# Patient Record
Sex: Female | Born: 1975 | Race: White | Hispanic: No | Marital: Single
Health system: Midwestern US, Community
[De-identification: ages and names within clinical notes are randomized; demographics above are authoritative.]

## PROBLEM LIST (undated history)

## (undated) DIAGNOSIS — D649 Anemia, unspecified: Secondary | ICD-10-CM

## (undated) DIAGNOSIS — C801 Malignant (primary) neoplasm, unspecified: Secondary | ICD-10-CM

## (undated) DIAGNOSIS — F1010 Alcohol abuse, uncomplicated: Secondary | ICD-10-CM

## (undated) DIAGNOSIS — F17200 Nicotine dependence, unspecified, uncomplicated: Secondary | ICD-10-CM

## (undated) HISTORY — PX: ROUX-EN-Y GASTRIC BYPASS: SHX1104

## (undated) HISTORY — PX: TEAR DUCT PROBING: SHX793

## (undated) HISTORY — DX: Anemia, unspecified: D64.9

## (undated) HISTORY — PX: OTHER SURGICAL HISTORY: SHX169

## (undated) HISTORY — PX: VAGOTOMY: SUR1431

## (undated) HISTORY — PX: ABDOMINAL ADHESION SURGERY: SHX90

## (undated) HISTORY — PX: VENTRICULOPERITONEAL SHUNT: SHX204

---

## 2011-05-11 DIAGNOSIS — M25569 Pain in unspecified knee: Secondary | ICD-10-CM | POA: Insufficient documentation

## 2011-05-26 DIAGNOSIS — F1910 Other psychoactive substance abuse, uncomplicated: Secondary | ICD-10-CM | POA: Insufficient documentation

## 2012-01-24 DIAGNOSIS — I10 Essential (primary) hypertension: Secondary | ICD-10-CM | POA: Insufficient documentation

## 2012-01-24 DIAGNOSIS — Z982 Presence of cerebrospinal fluid drainage device: Secondary | ICD-10-CM | POA: Insufficient documentation

## 2012-01-24 DIAGNOSIS — F17200 Nicotine dependence, unspecified, uncomplicated: Secondary | ICD-10-CM | POA: Insufficient documentation

## 2012-01-24 DIAGNOSIS — J4520 Mild intermittent asthma, uncomplicated: Secondary | ICD-10-CM | POA: Insufficient documentation

## 2012-06-04 DIAGNOSIS — Z9889 Other specified postprocedural states: Secondary | ICD-10-CM | POA: Insufficient documentation

## 2013-03-25 LAB — URINALYSIS W/ REFLEX CULTURE
Bilirubin: NEGATIVE
Blood: NEGATIVE
Glucose: NEGATIVE mg/dL
Leukocyte Esterase: NEGATIVE
Nitrites: NEGATIVE
Protein: NEGATIVE mg/dL
Specific gravity: 1.023 (ref 1.003–1.030)
Urobilinogen: 0.2 EU/dL (ref 0.2–1.0)
pH (UA): 5.5 (ref 5.0–8.0)

## 2013-03-25 LAB — PROTHROMBIN TIME + INR
INR: 1 (ref 0.9–1.1)
Prothrombin time: 10.3 s (ref 9.0–11.1)

## 2013-03-25 LAB — METABOLIC PANEL, COMPREHENSIVE
A-G Ratio: 1.2 (ref 1.1–2.2)
ALT (SGPT): 699 U/L — ABNORMAL HIGH (ref 12–78)
AST (SGOT): 974 U/L — ABNORMAL HIGH (ref 15–37)
Albumin: 4.1 g/dL (ref 3.5–5.0)
Alk. phosphatase: 122 U/L — ABNORMAL HIGH (ref 45–117)
Anion gap: 9 mmol/L (ref 5–15)
BUN/Creatinine ratio: 16 (ref 12–20)
BUN: 10 MG/DL (ref 6–20)
Bilirubin, total: 0.4 MG/DL (ref 0.2–1.0)
CO2: 29 mmol/L (ref 21–32)
Calcium: 8.6 MG/DL (ref 8.5–10.1)
Chloride: 108 mmol/L (ref 97–108)
Creatinine: 0.63 MG/DL (ref 0.45–1.15)
GFR est AA: >60 mL/min/{1.73_m2} (ref >60)
GFR est non-AA: >60 mL/min/{1.73_m2} (ref >60)
Globulin: 3.4 g/dL (ref 2.0–4.0)
Glucose: 112 mg/dL — ABNORMAL HIGH (ref 65–100)
Potassium: 4.1 mmol/L (ref 3.5–5.1)
Protein, total: 7.5 g/dL (ref 6.4–8.2)
Sodium: 146 mmol/L — ABNORMAL HIGH (ref 136–145)

## 2013-03-25 LAB — CBC WITH AUTOMATED DIFF
ABS. BASOPHILS: 0 10*3/uL (ref 0.0–0.1)
ABS. EOSINOPHILS: 0.2 10*3/uL (ref 0.0–0.4)
ABS. LYMPHOCYTES: 2.2 10*3/uL (ref 0.8–3.5)
ABS. MONOCYTES: 0.7 10*3/uL (ref 0.0–1.0)
ABS. NEUTROPHILS: 3.2 10*3/uL (ref 1.8–8.0)
BASOPHILS: 1 % (ref 0–1)
EOSINOPHILS: 3 % (ref 0–7)
HCT: 44 % (ref 35.0–47.0)
HGB: 14.9 g/dL (ref 11.5–16.0)
LYMPHOCYTES: 34 % (ref 12–49)
MCH: 30.1 PG (ref 26.0–34.0)
MCHC: 33.9 g/dL (ref 30.0–36.5)
MCV: 88.9 FL (ref 80.0–99.0)
MONOCYTES: 12 % (ref 5–13)
NEUTROPHILS: 50 % (ref 32–75)
PLATELET: 238 10*3/uL (ref 150–400)
RBC: 4.95 M/uL (ref 3.80–5.20)
RDW: 15.6 % — ABNORMAL HIGH (ref 11.5–14.5)
WBC: 6.3 10*3/uL (ref 3.6–11.0)

## 2013-03-25 LAB — DRUG SCREEN, URINE
AMPHETAMINES: NEGATIVE
BARBITURATES: NEGATIVE
BENZODIAZEPINES: NEGATIVE
COCAINE: NEGATIVE
METHADONE: NEGATIVE
OPIATES: POSITIVE — AB
PCP(PHENCYCLIDINE): NEGATIVE
THC (TH-CANNABINOL): NEGATIVE

## 2013-03-25 LAB — ACETAMINOPHEN: Acetaminophen level: <2 ug/mL — ABNORMAL LOW (ref 10–30)

## 2013-03-25 LAB — PTT: aPTT: 22.2 s (ref 22.1–30.0)

## 2013-03-25 LAB — HCG URINE, QL. - POC: Pregnancy test,urine (POC): NEGATIVE

## 2013-03-25 LAB — SALICYLATE: Salicylate level: 2.3 MG/DL — ABNORMAL LOW (ref 2.8–20.0)

## 2013-03-25 LAB — IRON PROFILE
Iron % saturation: 88 % — ABNORMAL HIGH (ref 20–50)
Iron: 277 ug/dL — ABNORMAL HIGH (ref 35–150)
TIBC: 313 ug/dL (ref 250–450)

## 2013-03-25 LAB — FERRITIN: Ferritin: 71 NG/ML (ref 8–252)

## 2013-03-25 LAB — AMMONIA: Ammonia, plasma: 53 umol/L — ABNORMAL HIGH (ref <32)

## 2013-03-25 LAB — LIPASE: Lipase: 166 U/L (ref 73–393)

## 2013-03-25 LAB — LACTIC ACID: Lactic acid: 1.1 MMOL/L (ref 0.4–2.0)

## 2013-03-25 MED ORDER — SODIUM CHLORIDE 0.9% BOLUS IV
0.9 % | Freq: Once | INTRAVENOUS | Status: AC
Start: 2013-03-25 — End: 2013-03-25
  Administered 2013-03-25: 06:00:00 via INTRAVENOUS

## 2013-03-25 MED ORDER — IOPAMIDOL 76 % IV SOLN
370 mg iodine /mL (76 %) | Freq: Once | INTRAVENOUS | Status: AC
Start: 2013-03-25 — End: 2013-03-25
  Administered 2013-03-25: 06:00:00 via INTRAVENOUS

## 2013-03-25 MED ORDER — SODIUM CHLORIDE 0.9 % IJ SYRG
Freq: Once | INTRAMUSCULAR | Status: AC
Start: 2013-03-25 — End: 2013-03-25
  Administered 2013-03-25: 06:00:00 via INTRAVENOUS

## 2013-03-25 MED ADMIN — ondansetron (ZOFRAN) injection 4 mg: INTRAVENOUS | @ 20:00:00 | NDC 00641607801

## 2013-03-25 MED ADMIN — sodium chloride (NS) flush 5-10 mL: INTRAVENOUS | @ 18:00:00 | NDC 87701099893

## 2013-03-25 MED ADMIN — 0.9% sodium chloride infusion: INTRAVENOUS | @ 10:00:00 | NDC 00409798309

## 2013-03-25 MED ADMIN — lithium carbonate capsule 300 mg: ORAL | @ 16:00:00 | NDC 00054852725

## 2013-03-25 MED ADMIN — dicyclomine (BENTYL) 10 mg/mL injection 20 mg: INTRAMUSCULAR | @ 08:00:00 | NDC 58914008052

## 2013-03-25 MED ADMIN — HYDROmorphone (PF) (DILAUDID) injection 1 mg: INTRAVENOUS | @ 07:00:00 | NDC 00409255201

## 2013-03-25 MED ADMIN — HYDROmorphone (PF) (DILAUDID) injection 1 mg: INTRAVENOUS | @ 06:00:00 | NDC 00409255201

## 2013-03-25 MED ADMIN — lithium carbonate capsule 300 mg: ORAL | @ 21:00:00 | NDC 00054852725

## 2013-03-25 MED ADMIN — 0.9% sodium chloride infusion: INTRAVENOUS | @ 18:00:00 | NDC 00409798309

## 2013-03-25 MED ADMIN — HYDROmorphone (PF) (DILAUDID) injection 2 mg: INTRAVENOUS | @ 18:00:00 | NDC 00409335601

## 2013-03-25 MED ADMIN — busPIRone (BUSPAR) tablet 10 mg: ORAL | @ 16:00:00 | NDC 51079098601

## 2013-03-25 MED ADMIN — sodium chloride 0.9 % bolus infusion 1,000 mL: INTRAVENOUS | @ 06:00:00 | NDC 00409798309

## 2013-03-25 MED ADMIN — HYDROmorphone (PF) (DILAUDID) injection 2 mg: INTRAVENOUS | @ 14:00:00 | NDC 00409335601

## 2013-03-25 MED ADMIN — HYDROmorphone (PF) (DILAUDID) injection 0.5 mg: INTRAVENOUS | @ 10:00:00 | NDC 00409255201

## 2013-03-25 MED ADMIN — ondansetron (ZOFRAN) injection 4 mg: INTRAVENOUS | @ 09:00:00 | NDC 55150012502

## 2013-03-25 MED ADMIN — busPIRone (BUSPAR) tablet 10 mg: ORAL | @ 21:00:00 | NDC 51079098601

## 2013-03-25 MED ADMIN — ondansetron (ZOFRAN) injection 8 mg: INTRAVENOUS | @ 06:00:00 | NDC 55150012502

## 2013-03-25 MED ADMIN — HYDROmorphone (PF) (DILAUDID) injection 1 mg: INTRAVENOUS | @ 09:00:00 | NDC 00409255201

## 2013-03-25 MED ADMIN — ketorolac (TORADOL) injection 30 mg: INTRAVENOUS | @ 06:00:00 | NDC 00409379501

## 2013-03-25 MED ADMIN — HYDROmorphone (PF) (DILAUDID) injection 2 mg: INTRAVENOUS | @ 22:00:00 | NDC 00409335601

## 2013-03-25 MED ADMIN — sodium chloride (NS) flush 5-10 mL: INTRAVENOUS | @ 10:00:00 | NDC 87701099893

## 2013-03-25 MED ADMIN — 0.9% sodium chloride infusion: INTRAVENOUS | @ 07:00:00 | NDC 87701099893

## 2013-03-25 MED ADMIN — pantoprazole (PROTONIX) 40 mg in sodium chloride 0.9 % 10 mL injection: INTRAVENOUS | @ 08:00:00 | NDC 63323018610

## 2013-03-25 MED ADMIN — LORazepam (ATIVAN) injection 1 mg: INTRAVENOUS | @ 07:00:00 | NDC 00641604401

## 2013-03-25 MED ADMIN — HYDROmorphone (PF) (DILAUDID) injection 1 mg: INTRAVENOUS | @ 13:00:00 | NDC 00409255201

## 2013-03-25 MED FILL — KETOROLAC TROMETHAMINE 30 MG/ML INJECTION: 30 mg/mL (1 mL) | INTRAMUSCULAR | Qty: 1

## 2013-03-25 MED FILL — LORAZEPAM 2 MG/ML IJ SOLN: 2 mg/mL | INTRAMUSCULAR | Qty: 1

## 2013-03-25 MED FILL — BD POSIFLUSH NORMAL SALINE 0.9 % INJECTION SYRINGE: INTRAMUSCULAR | Qty: 10

## 2013-03-25 MED FILL — ISOVUE-370  76 % INTRAVENOUS SOLUTION: 370 mg iodine /mL (76 %) | INTRAVENOUS | Qty: 100

## 2013-03-25 MED FILL — SODIUM CHLORIDE 0.9 % IV: INTRAVENOUS | Qty: 1000

## 2013-03-25 MED FILL — ONDANSETRON (PF) 4 MG/2 ML INJECTION: 4 mg/2 mL | INTRAMUSCULAR | Qty: 4

## 2013-03-25 MED FILL — DILAUDID (PF) 2 MG/ML INJECTION SOLUTION: 2 mg/mL | INTRAMUSCULAR | Qty: 1

## 2013-03-25 MED FILL — HYDROMORPHONE (PF) 1 MG/ML IJ SOLN: 1 mg/mL | INTRAMUSCULAR | Qty: 1

## 2013-03-25 MED FILL — BENTYL 10 MG/ML INTRAMUSCULAR SOLUTION: 10 mg/mL | INTRAMUSCULAR | Qty: 2

## 2013-03-25 MED FILL — BUSPIRONE 10 MG TAB: 10 mg | ORAL | Qty: 1

## 2013-03-25 MED FILL — LITHIUM CARBONATE 300 MG CAP: 300 mg | ORAL | Qty: 1

## 2013-03-25 MED FILL — PROTONIX 40 MG INTRAVENOUS SOLUTION: 40 mg | INTRAVENOUS | Qty: 40

## 2013-03-25 MED FILL — ONDANSETRON (PF) 4 MG/2 ML INJECTION: 4 mg/2 mL | INTRAMUSCULAR | Qty: 2

## 2013-03-25 NOTE — Progress Notes (Signed)
TRANSFER - IN REPORT:    Verbal report received from [place], Charity fundraiserN (name) on Sahar Kuechle  being received from ED (unit) for routine progression of care      Report consisted of patient???s Situation, Background, Assessment and   Recommendations(SBAR).     Information from the following report(s) SBAR, Kardex, ED Summary, Intake/Output, MAR and Recent Results was reviewed with the receiving nurse.    Opportunity for questions and clarification was provided.      Assessment completed upon patient???s arrival to unit and care assumed.

## 2013-03-25 NOTE — Other (Signed)
TRANSFER - OUT REPORT:    Verbal report given to Shay,RN(name) on Daviana Bueno  being transferred to 559(unit) for routine progression of care       Report consisted of patient???s Situation, Background, Assessment and   Recommendations(SBAR).     Information from the following report(s) SBAR, ED Summary, Intake/Output, MAR and Recent Results was reviewed with the receiving nurse.    Opportunity for questions and clarification was provided.

## 2013-03-25 NOTE — Progress Notes (Signed)
Spoke with Dr. Vaughan BastaPegram r/t pt c/o abdominal pain 10/10.  Rec'd one time order for Dilaudid 0.5mg  IV for breakthrough.  He stated that pt cannot have an addt'l dose until 4hrs from previous PRN dose (last @ 0448).

## 2013-03-25 NOTE — ED Notes (Signed)
Pt presents with complaints of abdominal pain that began this afternoon. Pt reports that she began having nausea, vomiting, and diarrhea on Saturday, and abdominal pain began today.

## 2013-03-25 NOTE — Consults (Addendum)
[facility]   [place] [id number]        GASTROENTEROLOGY CONSULTATION NOTE  Lynett Grimes ACNP  [phone]      NAME:  Christy Holmes   DOB:   06/25/1975   MRN:   [id number]       Referring Physician: Dr Lester Carolina    Consult Date: 03/25/2013 12:09 PM    Chief Complaint: elevated LFTs, abdominal pain, n/v and diarrhea     History of Present Illness:  Patient is a 38 y.o. female who is seen in consultation at the request of Dr. Lester Carolina for the above complaints.  Symptoms first began on Sunday with diarrhea.  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  She took 2 imodium and it stopped.  The diarrhea was not bloody or black to her recollection.  The pain and nausea followed.  The pain is located to her large midline scar and is described as a ripping, burning pain.   ER evaluation showed high transaminases and CBD of 1.5 cm.  As stated previously, she is a GBP and her gallbladder was removed 2 months after her gbp but she doesn't remember if she had stones or not.  She denies any new medications.  She takes approx 1525m of tylenol per day (tylenol for headaches and 2 tylenol pm at sleep).  Recent blood work looked good. No previous history of any liver issues. She does not drink and does not or has not ever used any IV drugs.  She denies any recent exposure to illness. Denies eating anything suspicious. No recent tick or insect bites.   PMH:  Past Medical History   Diagnosis Date   ??? Ill-defined condition      intracranial hypertension       PSH:  Past Surgical History   Procedure Laterality Date   ??? Pr abdomen surgery proc unlisted       bowel obstruction   ??? Pr abdomen surgery proc unlisted       bowel resection   ??? Pr abdomen surgery proc unlisted       gastric bypass   ??? Hx other surgical       vp shunt placement   ??? Hx appendectomy         Allergies:  Allergies   Allergen Reactions   ??? Diamox Sequels [Acetazolamide] Unknown (comments)    ??? Imitrex [Sumatriptan] Unknown (comments)   ??? Morphine Unknown (comments)       Home Medications:  Prior to Admission Medications   Prescriptions Last Dose Informant Patient Reported? Taking?   Omega-3 Fatty Acids 300 mg cap 03/23/2013  Yes Yes   Sig: Take  by mouth.   busPIRone (BUSPAR) 10 mg tablet 03/23/2013  Yes Yes   Sig: Take 10 mg by mouth three (3) times daily.   calcium 500 mg tab 03/23/2013  Yes Yes   Sig: Take  by mouth.   coenzyme q10 (CO Q-10) 10 mg cap 03/23/2013  Yes Yes   Sig: Take  by mouth.   cyanocobalamin (VITAMIN B12) 1,000 mcg/mL injection 02/22/2013  Yes Yes   Sig: 1,000 mcg by IntraMUSCular route every thirty (30) days.   lithium carbonate 300 mg capsule 03/23/2013  Yes Yes   Sig: Take  by mouth three (3) times daily (with meals).   magnesium 250 mg tab 03/23/2013  Yes Yes   Sig: Take  by  mouth.      Facility-Administered Medications: None       Hospital Medications:  Current Facility-Administered Medications   Medication Dose Route Frequency   ??? iopamidol (ISOVUE-370) 76 % injection       ??? 0.9% sodium chloride infusion  125 mL/hr IntraVENous CONTINUOUS   ??? sodium chloride (NS) flush 5-10 mL  5-10 mL IntraVENous Q8H   ??? sodium chloride (NS) flush 5-10 mL  5-10 mL IntraVENous PRN   ??? ondansetron (ZOFRAN) injection 4 mg  4 mg IntraVENous Q6H PRN   ??? busPIRone (BUSPAR) tablet 10 mg  10 mg Oral TID   ??? lithium carbonate capsule 300 mg  300 mg Oral TID WITH MEALS   ??? HYDROmorphone (PF) (DILAUDID) injection 2 mg  2 mg IntraVENous Q4H PRN       Social History:  History   Substance Use Topics   ??? Smoking status: Not on file   ??? Smokeless tobacco: Not on file   ??? Alcohol Use: Not on file       Family History:  History reviewed. No pertinent family history.    Review of Systems:  Constitutional: negative fever, negative chills, negative weight loss  Eyes:   negative visual changes  ENT:   negative sore throat, tongue or lip swelling  Respiratory:  negative cough, negative dyspnea  Cards:  negative for chest pain,  palpitations, lower extremity edema  GI:   See HPI  GU:  negative for frequency, dysuria  Integument:  negative for rash and pruritus  Heme:  negative for easy bruising and gum/nose bleeding  Musculoskel: negative for myalgias,  back pain and muscle weakness  Neuro: + headaches, no dizziness, vertigo  Psych:  negative for feelings of anxiety, depression     Objective:   Patient Vitals for the past 8 hrs:   BP Temp Pulse Resp SpO2   03/25/13 0830 128/74 mmHg 98.1 ??F (36.7 ??C) 78 16 98 %   03/25/13 0527 107/72 mmHg 98.2 ??F (36.8 ??C) 77 16 96 %   03/25/13 0454 125/82 mmHg 97.8 ??F (36.6 ??C) 84 18 94 %             EXAM: CONST- 38 year old caucasian female lying in bed grabbing her stomach    NEURO-alert and oriented x 3   HEENT-PERLA, EOMI, no scleral icterus   NECK - supple, thyroid smooth non-tender, no lymphadenopathy   LUNGS-clear to ausculation, (-) wheeze   CARD-regular rate and rhythym, S1 S2   ABD-soft, tender, large midline scar, BS +   EXT-no edema, warm   PSYCH - full, not anxious     Data Review     Recent Labs      03/25/13   0041   WBC  6.3   HGB  14.9   HCT  44.0   PLT  238     Recent Labs      03/25/13   0041   NA  146*   K  4.1   CL  108   CO2  29   BUN  10   CREA  0.63   GLU  112*   CA  8.6     Recent Labs      03/25/13   0041   SGOT  974*   AP  122*   TP  7.5   ALB  4.1   GLOB  3.4   LPSE  166     Recent Labs  03/25/13   0041   INR  1.0   PTP  10.3   APTT  22.2     ABD ultrasound  IMPRESSION:  1. Large CBD, significance uncertain postcholecystectomy.  2. No sonographically apparent choledocholithiasis.    CT ABD Pelvis  IMPRESSION:   Unremarkable CT through the abdomen and pelvis       Assessment:   ?? Elevated LFTs - viral panel pending. Tylenol level on admission was ok but takes 1500 mg of tyelnol per day and has for many years. Also on Lithium  ?? CBD 1.5 cm - s/p cholecystectomy, bilirubin and alk phos ok  ?? Abdominal pain with n/v/diarrhea - surgery has evaluated and no surgical intervention  planned.  May be related to above.     Patient Active Problem List   Diagnosis Code   ??? Intractable abdominal pain 789.00     Plan:   ?? MRCP  ?? No tylenol  ?? Consider holding lithium  ?? Follow trends  ?? Will send further liver serologies and stool studies     Signed By: Jessee Avers, NP     03/25/2013  12:09 PM

## 2013-03-25 NOTE — Other (Signed)
Clinical reviewed for medical necessity. CM to f/u with discharge planning. Jeanette Woodfin RN CRM    Sent to EHR for second review.

## 2013-03-25 NOTE — ED Notes (Signed)
Dr. Marks at the bedside.

## 2013-03-25 NOTE — H&P (Signed)
Name:       Christy Holmes, Christy Holmes                Admitted:    03/25/2013    Account #:  [id number]                     DOB:         1975/05/09  Physician:  Lucinda Dell, MD              Age:         38                               HISTORY AND PHYSICAL      CHIEF COMPLAINT: Abdominal pain, nausea, vomiting, diarrhea.    HISTORY OF PRESENT ILLNESS: A 38 year old white female, past medical  history of hydrocephalus, status post VP shunt versus (status post  removal), bipolar disorder, presented to the emergency department, chief  complaint of abdominal pain, nausea, vomiting, diarrhea. The patient notes  onset of symptoms starting approximately 2 days ago with generalized  abdominal pain, severe, constant, rated as a 9/10 without specific  alleviating factors, exacerbated with palpation, nonradiating. She  complains of nausea, vomiting, nonbloody, nonbilious emesis, episodes too  numerous to count, and diarrhea, loose, watery, nonbloody stools. She has  significant history of prior bowel obstruction requiring bowel resection in  05/2012. She notes that she underwent repeat abdominal surgery with removal  of scar tissue in September, 2014. She has a prior history of gastric  bypass. She notably receives lab work every 3 months followup for the same,  especially to monitor for anemia. She notes her last lab work was performed  in January, 2015 and revealed normal results. She notes her last normal  menstrual period was on 03/07/2013 and regular. She does not complain of  any current fever, chills, dizziness, lightheadedness, focal weakness,  headache, neck pain, back pain, chest pain, shortness of breath, cough,  congestion, numbness, paresthesias, focal weakness, melena, hematemesis,  dysuria, hematuria, calf pain, swelling, or edema. She does not report any  abdominal trauma. She indicates that her surgeries were performed at [facility] in [place] [facility] [place] On arrival in the emergency department, initial recorded  vital signs blood pressure 125/79, heart rate 92, respiratory rate 18, O2  saturation 97% on room air. Her labs showed elevated LFTs, elevated  alkaline phosphatase level, and elevated sodium. Acute abdominal series/her  chest x-ray showed moderate colonic stool, otherwise unremarkable study. CT  abdomen and pelvis with IV contrast was negative for acute  intraabdominal/pelvic process. Per the ED, the patient was already given  Dilaudid 1 mg IV x2 doses, Toradol 30 mg IV, Zofran 8 mg IV, Protonix 40 mg  IV, Ativan 1 mg IV, 0.9% normal saline 1 liter IV fluid bolus followed by  125 mL hour IV infusion, and Bentyl 20 mg IV. Despite all of these multiple  agents, the patient still notes her pain is a 9/10, having to sit upright  in bed curled over secondary to abdominal pain symptoms. She notes no prior  history of having liver dysfunction. She is here with her uncle. The  patient notes that she takes lithium. Her uncle indicates that she is also  taking BuSpar. She also lastly notes that she has been taking Tylenol PM 2  tablets at bedtime.  PAST MEDICAL HISTORY  1. Bipolar disorder - lithium and BuSpar.  2. Hydrocephalus with intracranial hypertension - status post prior VP  shunt, now status post removal of the same. The patient notes improvement  of symptoms following weight loss after gastric bypass.    PAST SURGICAL HISTORY  1. Exploratory laparotomy - with bowel resection, 05/2012.  2. Bowel resection and removal of scar tissue in September, 2014.  3. Gastric bypass, 2011.  4. VP shunt, followed by removal.  5. Appendectomy.    MEDICATIONS  1. BuSpar 10 mg p.o. t.i.d.  2. Lithium 300 mg p.o. t.i.d.  3. Tylenol PM 2 tablets p.o. at bedtime.  4. Magnesium.  5. Calcium.  6. Omega-3 fatty acids.  7. Imodium.  8. Multivitamin.    ALLERGIES  1. DIAMOX.  2. IMITREX.  3. MORPHINE.    SOCIAL HISTORY: Smokes cigarettes, half pack per day. Denies  alcohol.  Denies illicit drugs.    FAMILY HISTORY: Hypertension - her mother, brother, grandmother and  grandfather. Diabetes mellitus - mother, maternal grandmother. No reported  family members with heart attacks or strokes.    REVIEW OF SYSTEMS  Eleven systems reviewed, pertinent positives as HPI, otherwise negative.    PHYSICAL EXAMINATION  VITAL SIGNS: Temperature of 98.0 degrees Fahrenheit, blood pressure of  125/91, heart rate 83, respiratory rate 14, O2 saturation 96% on room air,  weight is 120 pounds.  GENERAL: The patient in no acute respiratory distress.  PSYCHIATRIC: The patient is awake, alert x3; anxious.  NEUROLOGIC: GCS of 15. Moves extremities x4. Sensation is grossly intact.  No slurred speech, no face asymmetry.  HEENT: Normocephalic, atraumatic. PERRLA. EOMs intact. Sclerae are  anicteric. Conjunctivae clear. Nares are patent. Oropharynx is clear.  NECK: Supple, without lymphadenopathy, JVD, carotid bruits, or  thyromegaly.  LYMPH: Negative for cervical or supraclavicular adenopathy.  RESPIRATORY: Lungs are clear to auscultation bilaterally.  CARDIOVASCULAR: Heart regular rate and rhythm, normal S1, S2, without  murmurs, rubs or gallops. GI: Abdomen soft. Generalized tenderness to light  palpation with voluntary guarding, no rebound, no rigidity. No auscultated  abdominal bruits. No pulsatile mass.  BACK: Positive for bilateral CVA tenderness. No step-off deformity.  MUSCULOSKELETAL: Full range of motion. Negative for calf tenderness.  VASCULAR: 2+ radial, 1+ dorsalis pedis pulses, without cyanosis, clubbing,  edema.  SKIN: Warm and dry.    LABORATORY DATA: Sodium 146, potassium 4.1, chloride 108, CO2 of 29, BUN of  10, creatinine 0.63, glucose 112. Anion gap 9, calcium 8.6, GFR greater  than 60, total bilirubin 0.4, total protein 7.5, albumin is 4.1, ALT of  699, AST of 974, alkaline phosphatase 122. Lipase 166. WBC 6.3, hemoglobin  14.9, hematocrit 44.0, platelets 238, neutrophils of 50%.  Urinalysis:  Leukocyte esterase negative, nitrites negative, urobilinogen 0.2, bilirubin  negative, blood negative, ketones trace, glucose negative, protein  negative, pH 5.5, specific gravity 1.023, WBC 0-4, RBCs 0-5, bacteria 1+.  CT abdomen and pelvis with IV contrast: Unremarkable CT through the abdomen  and pelvis.  Acute abdominal series with chest x-ray: Moderate colonic stool, otherwise  unremarkable.    IMPRESSION AND PLAN: A 38 year old female with noted past medical history,  now admitted with intractable abdominal pain, nausea, vomiting, elevated  LFTs.    1. Intractable abdominal pain - generalized. CT abdomen and pelvis was  unremarkable for any acute findings consistent with the severity of  patient-described patient's pain. I will consult with Gastroenterology  service today. May consult with surgical service, as she  has been  complaining of a noted scar tissue from prior surgeries. Keep patient  n.p.o. Provide IV fluids, pain medications, pain management.  2. Intractable nausea and vomiting. Provide Zofran 4 mg IV q.6h. p.r.n. for  the same.  3. Elevated LFTs. Order a right upper quadrant abdominal ultrasound. Order  acute hepatitis panel. Consult with gastroenterologist.  4. Diarrhea. Order stool for C difficile, fecal occult blood test.  5. Hypernatremia. Repeat the sodium levels. Monitor closely.  6. Bipolar disorder - on lithium and BuSpar. Patient is currently n.p.o.  Check her lithium level as patient is n.p.o. If patient is n.p.o. for any  prolonged status, then consult with Psychiatry service for further  recommendations.  7. Venous thromboembolism prophylaxis. SCDs, bilateral lower extremities.        Reviewed on 03/25/2013 7:20 AM                Lucinda Dell, MD    cc:                       Lucinda Dell, MD      MLP/wmx; D: 03/25/2013 04:51 A; T: 03/25/2013 06:25 A; DOC# [id number]; Job#  [id number]

## 2013-03-25 NOTE — Progress Notes (Signed)
Problem: Pain  Goal: *Control of Pain  Outcome: Not Progressing Towards Goal  Variance: Patient Condition  Comments: Patient's pain is not very well controlled by IV Dilaudid. She consistently rates her pain from a 8-10.

## 2013-03-25 NOTE — Progress Notes (Signed)
Bedside and Verbal shift change report given to Nestor RampNitin Sharma, RN  (oncoming nurse) by Charlott HollerSarah Nash, RN (offgoing nurse). Report included the following information SBAR.

## 2013-03-25 NOTE — Consults (Signed)
General Surgery In-Patient Consultation    Admit Date: 03/25/2013  Reason for Consultation: abd pain    HPI:  Christy Holmes is a 38 y.o. female whom we are asked to see in consultation by Dr. Vaughan BastaPegram for the above complaint.  Pt was admitted yesterday w/ abd pain.  She describes it as starting yesterday and is stabbing and constant throughout her abd.Marland Kitchen.Marland Kitchen.She has had prior nausea and vomiting but nothing that feels like it does now. . It feels like a prior SBO she had.  He has hx SBO w/ LOA last year.  She also had a gastric bypass in 2011.  All surgeries in [place].  She had a CT scan which was nl. An us which shows a dilated CBD (s/p cholecystectomy)  LFTs & NH4 level are elevated - GI is consulted    All labs reviewed  Patient Active Problem List    Diagnosis Date Noted   ??? Intractable abdominal pain 03/25/2013     Past Medical History   Diagnosis Date   ??? Ill-defined condition      intracranial hypertension      Past Surgical History   Procedure Laterality Date   ??? Pr abdomen surgery proc unlisted       bowel obstruction   ??? Pr abdomen surgery proc unlisted       bowel resection   ??? Pr abdomen surgery proc unlisted       gastric bypass   ??? Hx other surgical       vp shunt placement   ??? Hx appendectomy        History   Substance Use Topics   ??? Smoking status: Not on file   ??? Smokeless tobacco: Not on file   ??? Alcohol Use: Not on file      History reviewed. No pertinent family history.   Prior to Admission medications    Medication Sig Start Date End Date Taking? Authorizing Provider   lithium carbonate 300 mg capsule Take  by mouth three (3) times daily (with meals).   Yes Historical Provider   busPIRone (BUSPAR) 10 mg tablet Take 10 mg by mouth three (3) times daily.   Yes Historical Provider   Omega-3 Fatty Acids 300 mg cap Take  by mouth.   Yes Historical Provider   calcium 500 mg tab Take  by mouth.   Yes Historical Provider   magnesium 250 mg tab Take  by mouth.   Yes Historical Provider   coenzyme q10 (CO Q-10) 10  mg cap Take  by mouth.   Yes Historical Provider   cyanocobalamin (VITAMIN B12) 1,000 mcg/mL injection 1,000 mcg by IntraMUSCular route every thirty (30) days.   Yes Historical Provider     Current Facility-Administered Medications   Medication Dose Route Frequency   ??? iopamidol (ISOVUE-370) 76 % injection       ??? 0.9% sodium chloride infusion  125 mL/hr IntraVENous CONTINUOUS   ??? sodium chloride (NS) flush 5-10 mL  5-10 mL IntraVENous Q8H   ??? sodium chloride (NS) flush 5-10 mL  5-10 mL IntraVENous PRN   ??? ondansetron (ZOFRAN) injection 4 mg  4 mg IntraVENous Q6H PRN   ??? busPIRone (BUSPAR) tablet 10 mg  10 mg Oral TID   ??? lithium carbonate capsule 300 mg  300 mg Oral TID WITH MEALS   ??? HYDROmorphone (PF) (DILAUDID) injection 2 mg  2 mg IntraVENous Q4H PRN     Allergies   Allergen Reactions   ???  Diamox Sequels [Acetazolamide] Unknown (comments)   ??? Imitrex [Sumatriptan] Unknown (comments)   ??? Morphine Unknown (comments)          Subjective:     Review of Systems:    A comprehensive review of systems was negative except for that written in the History of Present Illness.       Objective:     Blood pressure 107/72, pulse 77, temperature 98.2 ??F (36.8 ??C), resp. rate 16, height 5\' 7"  (1.702 m), weight 120 lb (54.432 kg), last menstrual period 03/15/2013, SpO2 96 %, not currently breastfeeding.  Temp (24hrs), Avg:98 ??F (36.7 ??C), Min:97.8 ??F (36.6 ??C), Max:98.2 ??F (36.8 ??C)      Recent Labs      03/25/13   0041   WBC  6.3   HGB  14.9   HCT  44.0   PLT  238     Recent Labs      03/25/13   0041   NA  146*   K  4.1   CL  108   CO2  29   GLU  112*   BUN  10   CREA  0.63   CA  8.6   ALB  4.1   TBILI  0.4   SGOT  974*   ALT  699*   INR  1.0     Recent Labs      03/25/13   0041   LPSE  166       No intake or output data in the 24 hours ending 03/25/13 1050     _____________________  Physical Exam:     General:  Alert, cooperative, in mild distress, appears stated age.   Eyes:   Marland Kitchen Sclera clear.   Throat: Lips, mucosa, and tongue  normal.   Neck: Supple, symmetrical, trachea midline.   Lungs:   Clear to auscultation bilaterally.   Heart:  Regular rate and rhythm.   Abdomen:   Normal BS, flat, Soft, very tender throughout,  No masses,  No organomegaly.   Extremities: Extremities normal, atraumatic, no cyanosis or edema.   Skin: Skin color, texture, turgor normal. No rashes or lesions.             Assessment:   Principal Problem:    Intractable abdominal pain (03/25/2013)            Plan:     GI to see - evaluate CBD and LFTs  No surgical intervention needed at this time  Dr Nedra Hai requested and will see  Thank you for allowing [place] to participate in the care of this patient.     Total time spent with patient: 25 minutes.    Signed By: Alphonzo Severance, NP     March 25, 2013

## 2013-03-25 NOTE — Progress Notes (Signed)
Bedside shift change report given to S. Nash, RN (oncoming nurse) by T. Brandon, RN(offgoing nurse).  Report given with SBAR, Kardex, Procedure Summary, Intake/Output, MAR and Med Rec Status.

## 2013-03-25 NOTE — Progress Notes (Signed)
Patient is C/O abdominal  pain 10/10. She received dilaudid mg at 1030 which she stated is not doing anything for her. She is very tearful. She said the pain to her abdomen is unbearable. Patient was asked what she usually takes at home, she said she does not know.  Patient drift off to sleep during conversation. Dr Lebron QuamFahmi notified and she gave an order for mg dilaudid iv times one.

## 2013-03-25 NOTE — Progress Notes (Addendum)
Pt seen and examined  C/O Abd pain and diarrhea BS+  Has h/o adhesions and lysis May be related to partial SBO   Conservative MGMT NPO  Will seek surgical consult   Resume psyche meds  On lithium level low and busper  Abnormal LFT- w/u in progress has h/o GB removal was taking lot of tylenol   Psyche consulted: Q: lithium to stop?

## 2013-03-25 NOTE — H&P (Signed)
H&P dictated.  Job# [id number].

## 2013-03-25 NOTE — Progress Notes (Signed)
Pt refuses flu vaccine at this time

## 2013-03-25 NOTE — Progress Notes (Signed)
Chart reviewed. CM will follow for discharge needs. Erin L Bryant, RN BS CRM

## 2013-03-25 NOTE — ED Provider Notes (Signed)
HPI Comments: 38 y.o. female with past medical history significant for bowel obstruction who presents from home with chief complaint of abdominal pain. Pt states she is from [place] and currently visiting her uncle. She states she was admitted in May 2014 for bowel obstruction and underwent bowel resection with scar removal several months later. Pt reports 203 days of nausea, vomiting and diarrhea. She now c/o gradual onset of constant, diffuse, severe, aching abdominal pain beginning this afternoon accompanied by headache, nausea and dry heaves. Pain is exacerbated with palpation. Tylenol taken without relief. Pt denies any BM or flatus today. Denies any HA, fever, chills, cough, chest pain, back pain, dizziness, rash or syncope.    There are no other acute medical concerns at this time.    Significant PSHx: bowel resection, gastric bypass, appendectomy, VP shunt placement  Significant FMHx: none  Social hx: occasional etoh use  PCP: Unknown, in [place]    Note written by Orion CrookBrian M. Mikey BussingHoffman, Scribe, as dictated by Lanell Personsara Kyheem Bathgate, MD 1:13 AM        The history is provided by the patient.        Past Medical History   Diagnosis Date   ??? Ill-defined condition      intracranial hypertension        Past Surgical History   Procedure Laterality Date   ??? Pr abdomen surgery proc unlisted       bowel obstruction   ??? Pr abdomen surgery proc unlisted       bowel resection   ??? Pr abdomen surgery proc unlisted       gastric bypass   ??? Hx other surgical       vp shunt placement   ??? Hx appendectomy           History reviewed. No pertinent family history.     History     Social History   ??? Marital Status: SINGLE     Spouse Name: N/A     Number of Children: N/A   ??? Years of Education: N/A     Occupational History   ??? Not on file.     Social History Main Topics   ??? Smoking status: Not on file   ??? Smokeless tobacco: Not on file   ??? Alcohol Use: Not on file   ??? Drug Use: Not on file   ??? Sexual Activity: Not on file     Other Topics Concern   ??? Not on  file     Social History Narrative   ??? No narrative on file                  ALLERGIES: Diamox sequels; Imitrex; and Morphine      Review of Systems   Constitutional: Negative for fever, chills and diaphoresis.   HENT: Negative for congestion and rhinorrhea.    Eyes: Negative for visual disturbance.   Respiratory: Negative for cough and shortness of breath.    Cardiovascular: Negative for chest pain.   Gastrointestinal: Positive for nausea, vomiting, abdominal pain and constipation.   Genitourinary: Negative for dysuria and frequency.   Musculoskeletal: Negative for back pain.   Skin: Negative for rash.   Neurological: Negative for dizziness and headaches.   All other systems reviewed and are negative.  Note written by Orion CrookBrian M. Mikey BussingHoffman, Scribe, as dictated by Lanell Personsara Kaiyu Mirabal, MD 1:23 AM      Filed Vitals:    03/25/13 0036   BP: 125/79   Pulse: 92  Temp: 98 ??F (36.7 ??C)   Resp: 18   Height: 5\' 7"  (1.702 m)   Weight: 54.432 kg (120 lb)   SpO2: 97%            Physical Exam   Constitutional: She is oriented to person, place, and time. She appears well-developed and well-nourished. No distress.   HENT:   Head: Normocephalic and atraumatic.   Eyes: Conjunctivae are normal. No scleral icterus.   Neck: Neck supple. No tracheal deviation present.   Cardiovascular: Normal rate, regular rhythm, normal heart sounds and intact distal pulses.  Exam reveals no gallop and no friction rub.    No murmur heard.  Pulmonary/Chest: Effort normal and breath sounds normal. She has no wheezes. She has no rales.   Abdominal: Soft. She exhibits no distension. There is no rebound and no guarding.   Healed midline incision. Hyperactive bowel sounds. Diffuse tenderness.    Musculoskeletal: She exhibits no edema.   Neurological: She is alert and oriented to person, place, and time.   Skin: Skin is warm and dry. No rash noted.   Psychiatric: She has a normal mood and affect.   Nursing note and vitals reviewed.       MDM    Procedures    Labs WNL,  vitals ok.  Narcotics/toradol not controlling pain at all.  Will try ativan.  CT scan negative.  Given past surgical history and inability to control pain, patient from out of town, will plan to admit for pain control and further work up by [employer].  Spoke with Dr. Nedra Hai, surgery on call.  With negative work up in ED he states he would admit to hospitalist and they can consult in am.  Spoke with Dr. Vaughan Basta who will come evaluate for admission

## 2013-03-26 LAB — METABOLIC PANEL, COMPREHENSIVE
A-G Ratio: 1.3 (ref 1.1–2.2)
ALT (SGPT): 316 U/L — ABNORMAL HIGH (ref 12–78)
AST (SGOT): 181 U/L — ABNORMAL HIGH (ref 15–37)
Albumin: 3.9 g/dL (ref 3.5–5.0)
Alk. phosphatase: 104 U/L (ref 45–117)
Anion gap: 7 mmol/L (ref 5–15)
BUN/Creatinine ratio: 26 — ABNORMAL HIGH (ref 12–20)
BUN: 9 MG/DL (ref 6–20)
Bilirubin, total: 0.8 MG/DL (ref 0.2–1.0)
CO2: 25 mmol/L (ref 21–32)
Calcium: 8.7 MG/DL (ref 8.5–10.1)
Chloride: 106 mmol/L (ref 97–108)
Creatinine: 0.35 MG/DL — ABNORMAL LOW (ref 0.45–1.15)
GFR est AA: >60 mL/min/{1.73_m2} (ref >60)
GFR est non-AA: >60 mL/min/{1.73_m2} (ref >60)
Globulin: 3 g/dL (ref 2.0–4.0)
Glucose: 111 mg/dL — ABNORMAL HIGH (ref 65–100)
Potassium: 3.7 mmol/L (ref 3.5–5.1)
Protein, total: 6.9 g/dL (ref 6.4–8.2)
Sodium: 138 mmol/L (ref 136–145)

## 2013-03-26 LAB — CBC WITH AUTOMATED DIFF
ABS. BASOPHILS: 0 10*3/uL (ref 0.0–0.1)
ABS. EOSINOPHILS: 0.3 10*3/uL (ref 0.0–0.4)
ABS. LYMPHOCYTES: 2.2 10*3/uL (ref 0.8–3.5)
ABS. MONOCYTES: 0.9 10*3/uL (ref 0.0–1.0)
ABS. NEUTROPHILS: 8.1 10*3/uL — ABNORMAL HIGH (ref 1.8–8.0)
BASOPHILS: 0 % (ref 0–1)
EOSINOPHILS: 3 % (ref 0–7)
HCT: 39.6 % (ref 35.0–47.0)
HGB: 13 g/dL (ref 11.5–16.0)
LYMPHOCYTES: 19 % (ref 12–49)
MCH: 29.7 PG (ref 26.0–34.0)
MCHC: 32.8 g/dL (ref 30.0–36.5)
MCV: 90.6 FL (ref 80.0–99.0)
MONOCYTES: 8 % (ref 5–13)
NEUTROPHILS: 70 % (ref 32–75)
PLATELET: 199 10*3/uL (ref 150–400)
RBC: 4.37 M/uL (ref 3.80–5.20)
RDW: 15 % — ABNORMAL HIGH (ref 11.5–14.5)
WBC: 11.5 10*3/uL — ABNORMAL HIGH (ref 3.6–11.0)

## 2013-03-26 LAB — CULTURE, URINE
Colonies Counted: <10000
Colony Count: <10000
Culture result:: NO GROWTH
Culture: NO GROWTH

## 2013-03-26 LAB — HEPATITIS PANEL, ACUTE
Hep B Core Ab, IgM: NEGATIVE
Hep B surface Ag screen: NEGATIVE
Hep C Virus Ab: <0.1 s/co ratio (ref 0.0–0.9)
Hepatitis A Ab, IgM: NEGATIVE

## 2013-03-26 LAB — WBC, STOOL: White blood cells, stool: 0 /HPF (ref 0–4)

## 2013-03-26 LAB — BILIRUBIN, DIRECT: Bilirubin, direct: 0.2 MG/DL (ref 0.0–0.2)

## 2013-03-26 MED ORDER — SUCRALFATE 1 GRAM TAB
1 gram | ORAL_TABLET | Freq: Four times a day (QID) | ORAL | Status: AC
Start: 2013-03-26 — End: 2013-04-10

## 2013-03-26 MED ORDER — HYDROMORPHONE 2 MG TAB
2 mg | ORAL_TABLET | ORAL | Status: AC | PRN
Start: 2013-03-26 — End: 2013-03-31

## 2013-03-26 MED ORDER — GADOBUTROL 7.5 MMOL/7.5 ML (1 MMOL/ML) IV
7.5 mmol/ mL (1 mmol/mL) | Freq: Once | INTRAVENOUS | Status: AC
Start: 2013-03-26 — End: 2013-03-25
  Administered 2013-03-26: 01:00:00 via INTRAVENOUS

## 2013-03-26 MED ORDER — PANTOPRAZOLE 40 MG TAB, DELAYED RELEASE
40 mg | ORAL_TABLET | Freq: Every day | ORAL | Status: AC
Start: 2013-03-26 — End: 2013-04-25

## 2013-03-26 MED ADMIN — sodium chloride (NS) flush 5-10 mL: INTRAVENOUS | @ 04:00:00 | NDC 87701099893

## 2013-03-26 MED ADMIN — sodium chloride 0.9 % bolus infusion 75 mL: INTRAVENOUS | @ 01:00:00 | NDC 00409798353

## 2013-03-26 MED ADMIN — lithium carbonate capsule 300 mg: ORAL | @ 13:00:00 | NDC 00054852725

## 2013-03-26 MED ADMIN — pantoprazole (PROTONIX) 40 mg in sodium chloride 0.9 % 10 mL injection: INTRAVENOUS | @ 14:00:00 | NDC 63323018610

## 2013-03-26 MED ADMIN — LORazepam (ATIVAN) injection 1 mg: INTRAVENOUS | @ 01:00:00 | NDC 00641604401

## 2013-03-26 MED ADMIN — 0.9% sodium chloride infusion: INTRAVENOUS | @ 13:00:00 | NDC 00409798309

## 2013-03-26 MED ADMIN — HYDROmorphone (PF) (DILAUDID) injection 2 mg: INTRAVENOUS | @ 10:00:00 | NDC 00409335601

## 2013-03-26 MED ADMIN — sodium chloride (NS) flush 5-10 mL: INTRAVENOUS | @ 10:00:00 | NDC 87701099893

## 2013-03-26 MED ADMIN — ondansetron (ZOFRAN) injection 4 mg: INTRAVENOUS | @ 10:00:00 | NDC 00641607801

## 2013-03-26 MED ADMIN — HYDROmorphone (PF) (DILAUDID) injection 1 mg: INTRAVENOUS | @ 04:00:00 | NDC 00409255201

## 2013-03-26 MED ADMIN — busPIRone (BUSPAR) tablet 10 mg: ORAL | @ 13:00:00 | NDC 51079098601

## 2013-03-26 MED ADMIN — lithium carbonate capsule 300 mg: ORAL | @ 16:00:00 | NDC 00054852725

## 2013-03-26 MED ADMIN — pantoprazole (PROTONIX) injection 40 mg: INTRAVENOUS | @ 14:00:00 | NDC 82009001190

## 2013-03-26 MED ADMIN — 0.9% sodium chloride infusion: INTRAVENOUS | @ 14:00:00 | NDC 87701099893

## 2013-03-26 MED ADMIN — ondansetron (ZOFRAN) injection 4 mg: INTRAVENOUS | @ 02:00:00 | NDC 00641607801

## 2013-03-26 MED ADMIN — HYDROmorphone (PF) (DILAUDID) injection 2 mg: INTRAVENOUS | @ 02:00:00 | NDC 00409335601

## 2013-03-26 MED ADMIN — HYDROmorphone (PF) (DILAUDID) injection 2 mg: INTRAVENOUS | @ 06:00:00 | NDC 00409335601

## 2013-03-26 MED ADMIN — busPIRone (BUSPAR) tablet 10 mg: ORAL | @ 02:00:00 | NDC 51079098601

## 2013-03-26 MED ADMIN — sodium chloride (NS) flush 5-10 mL: INTRAVENOUS | @ 01:00:00 | NDC 87701099893

## 2013-03-26 MED ADMIN — sucralfate (CARAFATE) 100 mg/mL oral suspension 1 g: ORAL | @ 16:00:00 | NDC 66689079001

## 2013-03-26 MED ADMIN — HYDROmorphone (DILAUDID) tablet 2 mg: ORAL | @ 14:00:00 | NDC 68084042311

## 2013-03-26 MED FILL — BD POSIFLUSH NORMAL SALINE 0.9 % INJECTION SYRINGE: INTRAMUSCULAR | Qty: 10

## 2013-03-26 MED FILL — DILAUDID (PF) 2 MG/ML INJECTION SOLUTION: 2 mg/mL | INTRAMUSCULAR | Qty: 1

## 2013-03-26 MED FILL — HYDROMORPHONE (PF) 1 MG/ML IJ SOLN: 1 mg/mL | INTRAMUSCULAR | Qty: 1

## 2013-03-26 MED FILL — GADAVIST 7.5 MMOL/7.5 ML (1 MMOL/ML) INTRAVENOUS SOLUTION: 7.5 mmol/ mL (1 mmol/mL) | INTRAVENOUS | Qty: 7.5

## 2013-03-26 MED FILL — BUSPIRONE 10 MG TAB: 10 mg | ORAL | Qty: 1

## 2013-03-26 MED FILL — PROTONIX 40 MG INTRAVENOUS SOLUTION: 40 mg | INTRAVENOUS | Qty: 40

## 2013-03-26 MED FILL — LORAZEPAM 2 MG/ML IJ SOLN: 2 mg/mL | INTRAMUSCULAR | Qty: 1

## 2013-03-26 MED FILL — SUCRALFATE 100 MG/ML ORAL SUSP: 100 mg/mL | ORAL | Qty: 10

## 2013-03-26 MED FILL — ONDANSETRON (PF) 4 MG/2 ML INJECTION: 4 mg/2 mL | INTRAMUSCULAR | Qty: 2

## 2013-03-26 MED FILL — LITHIUM CARBONATE 300 MG CAP: 300 mg | ORAL | Qty: 1

## 2013-03-26 MED FILL — SODIUM CHLORIDE 0.9 % IV: INTRAVENOUS | Qty: 250

## 2013-03-26 MED FILL — HYDROMORPHONE 2 MG TAB: 2 mg | ORAL | Qty: 1

## 2013-03-26 NOTE — Consults (Signed)
Name:       Christy Holmes, Christy Holmes          Admitted:          03/25/2013                                         DOB:               Nov 26, 1975  Account #:  [id number]               Age:               38  Consultant: Billey Co, MD           Location                                CONSULTATION REPORT    DATE OF CONSULTATION           03/25/2013      REQUESTING PHYSICIAN: Dr. Sherian Maroon    REASON FOR CONSULTATION: Concern was for lithium toxicity for the liver.    HISTORY OF PRESENT ILLNESS: The patient was admitted to the hospital for  abdominal pain and is in the process of being worked up for the etiology.  She herself says that she has been taking lithium regularly for the past  few years, it has been very helpful for her and she has noted no GI  symptoms before this episode and onset of pain a couple of days ago. She  has no vomiting, no diarrhea, and overall has done well her lithium.  Followed by a psychiatrist in [place], [place], who had diagnosed  her with bipolar 2 disorder and treated her with different medications  before coming up with the combination of lithium, and BuSpar at 10 mg 3  times a day. The patient has a significant past medical history. A gastric  bypass operation for pseudotumor cerebri. She was quite overweight at the  time, has lost 160 pounds since the procedure a few years ago. Also, to be  noted is that she had obstruction of the bowel last May that recur  occasionally since September of 2014, and now again presents with GI  problems.    SOCIAL HISTORY: She is in the process of moving to [place]. Family lives  in the [place] area. She is obviously separated from the husband, but will  go back and visit her 3 children who are in high school.    FAMILY HISTORY: Noncontributory for substance abuse or psychiatric  illness.    PAST HISTORY: Does not include inpatient hospitalization for the past  several years. She has been treated by a psychiatrist approximately on a  monthly basis.  Never having suicidal thoughts or having a substance abuse  issue or so.    REVIEW OF SYSTEMS: Pretty much to the abdomen and the pain there in.    MENTAL STATUS EXAMINATION: Shows her to be alert and oriented. She is in  some distress with some pain at times, which seems quite genuine and  difficult for her to manage, but nevertheless was able to cooperate for the  most part of the interview. She was obviously in pain and not really  interested in elaborating much on herself, so that is understandable for  not being terribly talkative; however, there did seem  a bit of an effort  there not to be fully disclosing, but it could be because she has someone  that she is already working with and will continue to do so, though she is  considering a change in psychiatrist to [place] area, but also could be  that she has things that she would not like to discuss, not because they  are too personal, but because she would prefer not to reveal  history , and one  could only speculate on and speculate would be in poor form. The patient  was not anxious. Mood was okay. She was obviously in distress. Speech was  goal directed. As I said, a little bit private and not disclosing. No  psychotic symptoms. No suicidal thoughts, plans or impulses and no suicidal  risk. Cognitive function is intact.    DIAGNOSES: Self-reported bipolar 2 disorder would be the diagnosis, the  answer to the question. About lithium causing liver damage is no..        Reviewed on 03/26/2013 7:27 AM                Billey CoEli J Lanny Lipkin, MD    cc:                       Billey CoEli J Craigory Toste, MD      EJZ/wmx; D: 03/25/2013 11:41 P; T: 03/26/2013 12:40 A; DOC# [id number]; Job#  [id number]

## 2013-03-26 NOTE — Progress Notes (Addendum)
Jaye Beagle, MD  After 7pm please call operator for physician on call      Hospitalist Progress Note            Daily Progress Note: 03/26/2013    Not On File    Assessment/Plan:   1. Intractable abdominal pain - CT/ MRI bad normal CBD dilated but apparently WNL limit for a post cholecystectomy pt will d/c on PPI  Advance diet   2. Intractable nausea and vomiting. Provide Zofran 4 mg IV q.6h. Pain control advance diet   3. Elevated LFTs. trending down nicely unclear why elevated tylenol use every night?  4. Diarrhea.- resolving   5. Hypernatremia. Resolved  6. Bipolar disorder - on lithium and BuSpar. Seen by psyche resume meds   7. DVT- ppx  8. Dispo: home today if OK with GI with out pt follow up       Subjective:   The patient has no complains    Review of Systems:   Review of Systems:    Symptom Y/N Comments  Symptom Y/N Comments   Fever/Chills n   Chest Pain n    Poor Appetite n   Edema n    Cough n   Abdominal Pain n    Sputum    Joint Pain     SOB/DOE    Pruritis/Rash     Nausea/vomit    Tolerating PT/OT     Diarrhea    Tolerating Diet     Constipation    Other       Could not obtain due to:        Objective:   Physical Exam:     BP 127/81    Pulse 66    Temp(Src) 97.9 ??F (36.6 ??C)    Resp 16    Ht '5\' 7"'  (1.702 m)    Wt 54.432 kg (120 lb)    BMI 18.79 kg/m2      SpO2 94%    LMP 03/15/2013      Breastfeeding? No      O2 Device: Room air    Temp (24hrs), Avg:97.9 ??F (36.6 ??C), Min:97.5 ??F (36.4 ??C), Max:98.1 ??F (36.7 ??C)        03/09 1900 - 03/11 0659  In: 2810 [I.V.:2810]  Out: -             PHYSICAL EXAM:  General: cooperative, no acute distress????  EENT:   MMM  Resp:  CTA Bilaterally.   CV:  Regular  rhythm,??  No edema  GI:  Soft,  ??+Bowel sounds, no HSM  Neurologic:?? CN 2-12 gi, Alert and oriented X 3.    Psych:???? Good insight.??Not anxious nor agitated.      Data Review:       Recent Days:  Recent Labs      03/26/13   0340  03/25/13   0041   WBC  11.5*  6.3   HGB  13.0  14.9    HCT  39.6  44.0   PLT  199  238     Recent Labs      03/26/13   0340  03/25/13   0041   NA  138  146*   K  3.7  4.1   CL  106  108   CO2  25  29   GLU  111*  112*   BUN  9  10   CREA  0.35*  0.63   CA  8.7  8.6  ALB  3.9  4.1   TBILI  0.8  0.4   SGOT  181*  974*   ALT  316*  699*   INR   --   1.0     No results for input(s): PH, PCO2, PO2, HCO3, FIO2 in the last 72 hours.    24 Hour Results:  Recent Results (from the past 24 hour(s))   FERRITIN    Collection Time     03/25/13  4:00 PM       Result Value Ref Range    Ferritin 71  8 - 252 NG/ML   IRON PROFILE    Collection Time     03/25/13  4:00 PM       Result Value Ref Range    Iron 277 (*) 35 - 150 ug/dL    TIBC 313  250 - 450 ug/dL    Iron % saturation 88 (*) 20 - 50 %   METABOLIC PANEL, COMPREHENSIVE    Collection Time     03/26/13  3:40 AM       Result Value Ref Range    Sodium 138  136 - 145 mmol/L    Potassium 3.7  3.5 - 5.1 mmol/L    Chloride 106  97 - 108 mmol/L    CO2 25  21 - 32 mmol/L    Anion gap 7  5 - 15 mmol/L    Glucose 111 (*) 65 - 100 mg/dL    BUN 9  6 - 20 MG/DL    Creatinine 0.35 (*) 0.45 - 1.15 MG/DL    BUN/Creatinine ratio 26 (*) 12 - 20      GFR est AA >60  >60 ml/min/1.50m    GFR est non-AA >60  >60 ml/min/1.780m   Calcium 8.7  8.5 - 10.1 MG/DL    Bilirubin, total 0.8  0.2 - 1.0 MG/DL    ALT 316 (*) 12 - 78 U/L    AST 181 (*) 15 - 37 U/L    Alk. phosphatase 104  45 - 117 U/L    Protein, total 6.9  6.4 - 8.2 g/dL    Albumin 3.9  3.5 - 5.0 g/dL    Globulin 3.0  2.0 - 4.0 g/dL    A-G Ratio 1.3  1.1 - 2.2     CBC WITH AUTOMATED DIFF    Collection Time     03/26/13  3:40 AM       Result Value Ref Range    WBC 11.5 (*) 3.6 - 11.0 K/uL    RBC 4.37  3.80 - 5.20 M/uL    HGB 13.0  11.5 - 16.0 g/dL    HCT 39.6  35.0 - 47.0 %    MCV 90.6  80.0 - 99.0 FL    MCH 29.7  26.0 - 34.0 PG    MCHC 32.8  30.0 - 36.5 g/dL    RDW 15.0 (*) 11.5 - 14.5 %    PLATELET 199  150 - 400 K/uL    NEUTROPHILS 70  32 - 75 %    LYMPHOCYTES 19  12 - 49 %    MONOCYTES 8  5 -  13 %    EOSINOPHILS 3  0 - 7 %    BASOPHILS 0  0 - 1 %    ABS. NEUTROPHILS 8.1 (*) 1.8 - 8.0 K/UL    ABS. LYMPHOCYTES 2.2  0.8 - 3.5 K/UL    ABS. MONOCYTES 0.9  0.0 - 1.0  K/UL    ABS. EOSINOPHILS 0.3  0.0 - 0.4 K/UL    ABS. BASOPHILS 0.0  0.0 - 0.1 K/UL   BILIRUBIN, DIRECT    Collection Time     03/26/13  3:40 AM       Result Value Ref Range    Bilirubin, direct 0.2  0.0 - 0.2 MG/DL       Problem List:  Problem List as of 03/26/2013 Date Reviewed: 03/25/2013        ICD-9-CM Class Noted - Resolved     *Intractable abdominal pain 789.00  03/25/2013 - Present                  Medications reviewed- Reviewed   Current Facility-Administered Medications   Medication Dose Route Frequency   ??? sucralfate (CARAFATE) 100 mg/mL oral suspension 1 g  1 g Oral AC&HS   ??? pantoprazole (PROTONIX) injection 40 mg  40 mg IntraVENous Q24H   ??? 0.9% sodium chloride infusion  125 mL/hr IntraVENous CONTINUOUS   ??? sodium chloride (NS) flush 5-10 mL  5-10 mL IntraVENous Q8H   ??? sodium chloride (NS) flush 5-10 mL  5-10 mL IntraVENous PRN   ??? ondansetron (ZOFRAN) injection 4 mg  4 mg IntraVENous Q6H PRN   ??? busPIRone (BUSPAR) tablet 10 mg  10 mg Oral TID   ??? lithium carbonate capsule 300 mg  300 mg Oral TID WITH MEALS   ??? HYDROmorphone (PF) (DILAUDID) injection 2 mg  2 mg IntraVENous Q4H PRN       Code Status:  Full Code x   DNR/DNI    ______________________________________________________________________  Total NON critical care TIME:  Minutes    Total CRITICAL CARE TIME Spent:   Minutes      Comments   >50% of visit spent in counseling and coordination of care     ______________________________________________________________________  Procedures: see electronic medical records for all procedures/Xrays and details which were not copied into this note but were reviewed prior to creation of Plan.          Total time spent with patient: 30 minutes.    Jaye Beagle, MD

## 2013-03-26 NOTE — Progress Notes (Signed)
Patient received a copy of discharge instructions and prescriptions. Patient verbalizes understanding of discharge teaching and has no questions at this time. Belongings packed by patient and family. Patient to be taken down by volunteers.

## 2013-03-26 NOTE — Progress Notes (Addendum)
[place] [address]       GI PROGRESS NOTE  Lynett Grimes ACNP  [phone]    NAME: Christy Holmes   DOB:  Sep 28, 1975   MRN:  [id number]       Subjective:     Feeling wiped out but better, still has pain, wants to eat solid food    Objective:     VITALS:   Last 24hrs VS reviewed since prior progress note. Most recent are:  Visit Vitals   Item Reading   ??? BP 127/81   ??? Pulse 66   ??? Temp 97.9 ??F (36.6 ??C)   ??? Resp 16   ??? Ht $Remo'5\' 7"'HpwjF$  (1.702 m)   ??? Wt 54.432 kg (120 lb)   ??? BMI 18.79 kg/m2   ??? SpO2 94%   ??? Breastfeeding No       PHYSICAL EXAM:  General: Cooperative, no acute distress????  Neurologic:?? Alert and oriented X 3.  HEENT: PERRLA, EOMI.   Lungs:  CTA Bilaterally. No Wheezing  Heart:  s1 s2, Regular  rhythm,?? No murmur   Abdomen: Soft, tender  Extremities: No edema  Psych:???? Good insight. calm    Lab Data Reviewed:     Recent Results (from the past 24 hour(s))   FERRITIN    Collection Time     03/25/13  4:00 PM       Result Value Ref Range    Ferritin 71  8 - 252 NG/ML   IRON PROFILE    Collection Time     03/25/13  4:00 PM       Result Value Ref Range    Iron 277 (*) 35 - 150 ug/dL    TIBC 313  250 - 450 ug/dL    Iron % saturation 88 (*) 20 - 50 %   METABOLIC PANEL, COMPREHENSIVE    Collection Time     03/26/13  3:40 AM       Result Value Ref Range    Sodium 138  136 - 145 mmol/L    Potassium 3.7  3.5 - 5.1 mmol/L    Chloride 106  97 - 108 mmol/L    CO2 25  21 - 32 mmol/L    Anion gap 7  5 - 15 mmol/L    Glucose 111 (*) 65 - 100 mg/dL    BUN 9  6 - 20 MG/DL    Creatinine 0.35 (*) 0.45 - 1.15 MG/DL    BUN/Creatinine ratio 26 (*) 12 - 20      GFR est AA >60  >60 ml/min/1.5m2    GFR est non-AA >60  >60 ml/min/1.18m2    Calcium 8.7  8.5 - 10.1 MG/DL    Bilirubin, total 0.8  0.2 - 1.0 MG/DL    ALT 316 (*) 12 - 78 U/L    AST 181 (*) 15 - 37 U/L    Alk. phosphatase 104  45 - 117 U/L    Protein, total 6.9  6.4 - 8.2 g/dL    Albumin 3.9  3.5 - 5.0 g/dL    Globulin 3.0   2.0 - 4.0 g/dL    A-G Ratio 1.3  1.1 - 2.2     CBC WITH AUTOMATED DIFF    Collection Time     03/26/13  3:40 AM       Result Value Ref Range    WBC 11.5 (*) 3.6 - 11.0 K/uL  RBC 4.37  3.80 - 5.20 M/uL    HGB 13.0  11.5 - 16.0 g/dL    HCT 39.6  35.0 - 47.0 %    MCV 90.6  80.0 - 99.0 FL    MCH 29.7  26.0 - 34.0 PG    MCHC 32.8  30.0 - 36.5 g/dL    RDW 15.0 (*) 11.5 - 14.5 %    PLATELET 199  150 - 400 K/uL    NEUTROPHILS 70  32 - 75 %    LYMPHOCYTES 19  12 - 49 %    MONOCYTES 8  5 - 13 %    EOSINOPHILS 3  0 - 7 %    BASOPHILS 0  0 - 1 %    ABS. NEUTROPHILS 8.1 (*) 1.8 - 8.0 K/UL    ABS. LYMPHOCYTES 2.2  0.8 - 3.5 K/UL    ABS. MONOCYTES 0.9  0.0 - 1.0 K/UL    ABS. EOSINOPHILS 0.3  0.0 - 0.4 K/UL    ABS. BASOPHILS 0.0  0.0 - 0.1 K/UL   BILIRUBIN, DIRECT    Collection Time     03/26/13  3:40 AM       Result Value Ref Range    Bilirubin, direct 0.2  0.0 - 0.2 MG/DL         ________________________________________________________________________       Assessment:   ?? Elevated LFT - MRCP was ok, lfts remain elevated but much improved.  Full work up is pending.  Iron sat likely high because of recent iron infusion.  Psych eval for lithium level noted.       Patient Active Problem List   Diagnosis Code   (none) - all problems resolved or deleted     Plan:   ?? Advance diet  ?? No tylenol  ?? If tolerates then ok to go home with f/u in the office     Signed By: Jessee Avers, NP     03/26/2013  10:55 AM         GI Attending: Agree with above. Will follow up as an outpatient. PPI, avoid NSAID's.  Denorris Reust P. Elisha Ponder, MD

## 2013-03-26 NOTE — Progress Notes (Signed)
[facility]    Subjective    Patient says she is having a little more pain today, NPO, had a BM and passing flatus    Objective    Patient Vitals for the past 24 hrs:   Temp Pulse Resp BP SpO2   03/26/13 0820 97.9 ??F (36.6 ??C) 66 16 127/81 mmHg 94 %   03/26/13 0300 97.5 ??F (36.4 ??C) 69 16 115/81 mmHg 96 %   03/25/13 2024 98.1 ??F (36.7 ??C) 66 15 117/81 mmHg 95 %   03/25/13 1440 98 ??F (36.7 ??C) 76 16 138/89 mmHg 98 %       PE  Pulm - CTAB  CV - RRR  Abd - soft, ND, BS Present, TTP    Labs  Recent Results (from the past 12 hour(s))   METABOLIC PANEL, COMPREHENSIVE    Collection Time     03/26/13  3:40 AM       Result Value Ref Range    Sodium 138  136 - 145 mmol/L    Potassium 3.7  3.5 - 5.1 mmol/L    Chloride 106  97 - 108 mmol/L    CO2 25  21 - 32 mmol/L    Anion gap 7  5 - 15 mmol/L    Glucose 111 (*) 65 - 100 mg/dL    BUN 9  6 - 20 MG/DL    Creatinine 0.35 (*) 0.45 - 1.15 MG/DL    BUN/Creatinine ratio 26 (*) 12 - 20      GFR est AA >60  >60 ml/min/1.76m    GFR est non-AA >60  >60 ml/min/1.757m   Calcium 8.7  8.5 - 10.1 MG/DL    Bilirubin, total 0.8  0.2 - 1.0 MG/DL    ALT 316 (*) 12 - 78 U/L    AST 181 (*) 15 - 37 U/L    Alk. phosphatase 104  45 - 117 U/L    Protein, total 6.9  6.4 - 8.2 g/dL    Albumin 3.9  3.5 - 5.0 g/dL    Globulin 3.0  2.0 - 4.0 g/dL    A-G Ratio 1.3  1.1 - 2.2     CBC WITH AUTOMATED DIFF    Collection Time     03/26/13  3:40 AM       Result Value Ref Range    WBC 11.5 (*) 3.6 - 11.0 K/uL    RBC 4.37  3.80 - 5.20 M/uL    HGB 13.0  11.5 - 16.0 g/dL    HCT 39.6  35.0 - 47.0 %    MCV 90.6  80.0 - 99.0 FL    MCH 29.7  26.0 - 34.0 PG    MCHC 32.8  30.0 - 36.5 g/dL    RDW 15.0 (*) 11.5 - 14.5 %    PLATELET 199  150 - 400 K/uL    NEUTROPHILS 70  32 - 75 %    LYMPHOCYTES 19  12 - 49 %    MONOCYTES 8  5 - 13 %    EOSINOPHILS 3  0 - 7 %    BASOPHILS 0  0 - 1 %    ABS. NEUTROPHILS 8.1 (*) 1.8 - 8.0 K/UL    ABS. LYMPHOCYTES 2.2  0.8 - 3.5  K/UL    ABS. MONOCYTES 0.9  0.0 - 1.0 K/UL    ABS. EOSINOPHILS 0.3  0.0 - 0.4 K/UL    ABS. BASOPHILS 0.0  0.0 - 0.1  K/UL   BILIRUBIN, DIRECT    Collection Time     03/26/13  3:40 AM       Result Value Ref Range    Bilirubin, direct 0.2  0.0 - 0.2 MG/DL         Assessment    Christy Holmes is a 38 y.o.yr old female with s/p gastric bypass with abdominal pain and elevated LFTs    Plan    No signs of bowel obstruction passing BM and flatus, I don't think diagnostic laparoscopy is indicated with normal CT and no signs of bowel obstruction  Pain could be related to an ulcer possibly, will start PPI and carafate  Could start PO from my perspective    Debbe Bales, MD

## 2013-03-26 NOTE — Progress Notes (Signed)
Bedside and Verbal shift change report given to Brayton CavesJessie, Charity fundraiserN (oncoming nurse) by Karle StarchNitin, RN (offgoing nurse). Report included the following information SBAR, Kardex, MAR and Recent Results.

## 2013-03-27 LAB — POC FECAL OCCULT BLOOD
Occult blood, stool (POC): NEGATIVE
Occult blood, stool (POC): NEGATIVE

## 2013-03-27 LAB — CBC WITH AUTOMATED DIFF
ABS. BASOPHILS: 0 10*3/uL (ref 0.0–0.1)
ABS. EOSINOPHILS: 0.3 10*3/uL (ref 0.0–0.4)
ABS. LYMPHOCYTES: 2.1 10*3/uL (ref 0.8–3.5)
ABS. MONOCYTES: 0.5 10*3/uL (ref 0.0–1.0)
ABS. NEUTROPHILS: 5.5 10*3/uL (ref 1.8–8.0)
BASOPHILS: 0 % (ref 0–1)
EOSINOPHILS: 4 % (ref 0–7)
HCT: 42.8 % (ref 35.0–47.0)
HGB: 14.2 g/dL (ref 11.5–16.0)
LYMPHOCYTES: 25 % (ref 12–49)
MCH: 30.1 PG (ref 26.0–34.0)
MCHC: 33.2 g/dL (ref 30.0–36.5)
MCV: 90.9 FL (ref 80.0–99.0)
MONOCYTES: 6 % (ref 5–13)
NEUTROPHILS: 65 % (ref 32–75)
PLATELET: 182 10*3/uL (ref 150–400)
RBC: 4.71 M/uL (ref 3.80–5.20)
RDW: 14.5 % (ref 11.5–14.5)
WBC: 8.4 10*3/uL (ref 3.6–11.0)

## 2013-03-27 LAB — METABOLIC PANEL, COMPREHENSIVE
A-G Ratio: 1 — ABNORMAL LOW (ref 1.1–2.2)
ALT (SGPT): 187 U/L — ABNORMAL HIGH (ref 12–78)
Albumin: 3.8 g/dL (ref 3.5–5.0)
Alk. phosphatase: 88 U/L (ref 45–117)
Anion gap: 5 mmol/L (ref 5–15)
BUN/Creatinine ratio: 21 — ABNORMAL HIGH (ref 12–20)
BUN: 5 MG/DL — ABNORMAL LOW (ref 6–20)
Bilirubin, total: 0.4 MG/DL (ref 0.2–1.0)
CO2: 27 mmol/L (ref 21–32)
Calcium: 8.7 MG/DL (ref 8.5–10.1)
Chloride: 103 mmol/L (ref 97–108)
Creatinine: 0.24 MG/DL — ABNORMAL LOW (ref 0.45–1.15)
GFR est AA: >60 mL/min/{1.73_m2} (ref >60)
GFR est non-AA: >60 mL/min/{1.73_m2} (ref >60)
Globulin: 3.9 g/dL (ref 2.0–4.0)
Glucose: 81 mg/dL (ref 65–100)
Protein, total: 7.7 g/dL (ref 6.4–8.2)
Sodium: 135 mmol/L — ABNORMAL LOW (ref 136–145)

## 2013-03-27 LAB — ACTIN (SMOOTH MUSCLE) ANTIBODY
Actin (Smooth Muscle) Ab: 7 Units (ref 0–19)
Smooth Muscle Ab: 7 Units (ref 0–19)

## 2013-03-27 LAB — MITOCHONDRIAL M2 AB: Michochondrial (M2) Ab: 2.8 Units (ref 0.0–20.0)

## 2013-03-27 LAB — ANA BY MULTIPLEX FLOW IA, QL
ANA, Direct: POSITIVE — AB
ANA: POSITIVE — AB

## 2013-03-27 LAB — THYROID ANTIBODY PANEL
Thyroglobulin Ab: <1 IU/mL (ref 0.0–0.9)
Thyroid peroxidase Ab: 11 IU/mL (ref 0–34)

## 2013-03-27 LAB — CERULOPLASMIN: Ceruloplasmin: 16.4 mg/dL (ref 16.0–45.0)

## 2013-03-27 LAB — ALPHA-1-ANTITRYPSIN, TOTAL
AlpHa-1 Antitrypsin: 102 mg/dL (ref 90–200)
Alpha-1 Antitrypsin: 102 mg/dL (ref 90–200)

## 2013-03-27 LAB — LIPASE: Lipase: 90 U/L (ref 73–393)

## 2013-03-27 NOTE — ED Notes (Signed)
Patient verbalizes understanding of discharge instructions. Ambulatory and in no acute distress at discharge.

## 2013-03-27 NOTE — ED Provider Notes (Signed)
HPI Comments: 38 y.o. female with past medical history significant for intracranial hypertension who presents with chief complaint of 4-5 episodes of melena. Patient states she is now having swelling to her abdomen and "blacky, tarry stools" since last night. She also complains of associated upper mid abdominal pain that she describes as a "ripping sensation". Patient denies taking ibuprofen, aspirin or aleve. She has been on dilaudid for pain after being discharged yesterday. She also had Protonix and Carafate filled since her ER visit yesterday and reports no relief of sx. Patient complains of intermittent nausea but no fever, vomiting, urinary symptoms, weakness, previous history of GI bleed. Patient has been in [place] for one month after moving from [facility]. H/o gastric bypass in 2001 done by her physician in [place]. after a bowel obstruction was found in May/2014; also had resection in Sept/2014 after finding scar tissue. Pain is severe, she says.    There are no other acute medical concerns at this time.    Old chart reviewed: Admitted on 03/25/13 here and seen by GI and psychiatry. D/c yesterday from ER. CT was negative; U/S showed large common bile duct. Also had a MRCP.    Social hx: smoker  Note written by Sharion Settler, Scribe, as dictated by Volanda Napoleon, DO 2:39 PM            The history is provided by the patient.        Past Medical History   Diagnosis Date   ??? Ill-defined condition      intracranial hypertension        Past Surgical History   Procedure Laterality Date   ??? Pr abdomen surgery proc unlisted       bowel obstruction   ??? Pr abdomen surgery proc unlisted       bowel resection   ??? Pr abdomen surgery proc unlisted       gastric bypass   ??? Hx other surgical       vp shunt placement   ??? Hx appendectomy           History reviewed. No pertinent family history.     History     Social History   ??? Marital Status: SINGLE     Spouse Name: N/A     Number of Children: N/A   ??? Years of Education: N/A      Occupational History   ??? Not on file.     Social History Main Topics   ??? Smoking status: Not on file   ??? Smokeless tobacco: Not on file   ??? Alcohol Use: Not on file   ??? Drug Use: Not on file   ??? Sexual Activity: Not on file     Other Topics Concern   ??? Not on file     Social History Narrative                  ALLERGIES: Diamox sequels; Imitrex; and Morphine      Review of Systems   Constitutional: Negative for fever.   Gastrointestinal: Positive for nausea, abdominal pain, blood in stool and anal bleeding. Negative for vomiting.   Genitourinary: Negative.  Negative for difficulty urinating.   Neurological: Negative for weakness.   All other systems reviewed and are negative.      Filed Vitals:    03/27/13 1259 03/27/13 1502   BP: 157/77 138/79   Pulse: 79 82   Temp: 98.4 ??F (36.9 ??C) 98.3 ??F (36.8 ??C)  Resp: 16 16   Height: 5\' 7"  (1.702 m)    Weight: 49.896 kg (110 lb)    SpO2: 96% 97%            Physical Exam   Constitutional: She appears well-developed and well-nourished. No distress.   HENT:   Head: Normocephalic.   Mouth/Throat: Oropharynx is clear and moist.   Eyes: Conjunctivae are normal. Pupils are equal, round, and reactive to light.   Neck: Normal range of motion.   Cardiovascular: Normal rate, regular rhythm, normal heart sounds and intact distal pulses.  Exam reveals no gallop and no friction rub.    No murmur heard.  Pulmonary/Chest: Effort normal and breath sounds normal.   Abdominal: Soft. She exhibits no distension. There is tenderness (upper abdominal tenderness; non focalized).   Genitourinary: Guaiac negative stool (rectal exam: brown stool heme negative).   Musculoskeletal: Normal range of motion. She exhibits no edema.   Neurological: She is alert.   Skin: Skin is warm and dry. No rash noted. She is not diaphoretic. No pallor.   Nursing note and vitals reviewed.  Note written by Sharion SettlerElizabeth H. Young, Scribe, as dictated by No att. providers found 2:41 PM    MDM    Procedures    CONSULT NOTE:   2:29 PM No att. providers found spoke with Dr. Tally Joealreja. Consulted and discussed available diagnostic tests and clinical findings. He recommends keeping medications all medications the same. Will see her back in the office.      Labs Reviewed   METABOLIC PANEL, COMPREHENSIVE - Abnormal; Notable for the following:     Sodium 135 (*)     BUN 5 (*)     Creatinine 0.24 (*)     BUN/Creatinine ratio 21 (*)     ALT 187 (*)     A-G Ratio 1.0 (*)     All other components within normal limits   CBC WITH AUTOMATED DIFF   LIPASE   SAMPLES BEING HELD   POC FECAL OCCULT BLOOD   POC FECAL OCCULT BLOOD   SAMPLE TO BLOOD BANK     No indication for readmission.  No melena on exam which is curious.  Discussed f/u plan with pt and mother.

## 2013-03-27 NOTE — ED Notes (Signed)
Patient tearful. Requesting to speak to Dr. Rennis ChrisEngel. MD notified.

## 2013-03-27 NOTE — ED Notes (Signed)
Pt stated she was discharged yesterday from hospital, was here for abd pain and they did not find anything wrong, pt now having black tarry stool, +nausea, denies fever, +mid abd pain, denies urinary symptoms

## 2013-03-28 LAB — CULTURE, STOOL: Culture result:: NEGATIVE

## 2013-03-28 NOTE — Discharge Summary (Signed)
Discharge Summary       PATIENT ID: Christy Holmes  MRN: [id number]   DATE OF BIRTH: [birth year]    DATE OF ADMISSION: 03/27/2013  1:15 PM    DATE OF DISCHARGE: 03/25/13   PRIMARY CARE PROVIDER: Not On File       DISCHARGING PHYSICIAN: Lucky Rathke, MD    To contact this individual call [phone] and ask the operator to page.  If unavailable ask to be transferred the Adult Hospitalist Department.    CONSULTATIONS: None    PROCEDURES/SURGERIES: * No surgery found *    ADMITTING DIAGNOSES & HOSPITAL COURSE:   Abdominal pain     1. Intractable abdominal pain - CT/ MRI showed normal CBD dilated but apparently WNL,  limit for a post cholecystectomy pt will d/c on PPI Advance diet seen by GI agreed with paln  2. Intractable nausea and vomiting. Provide Zofran 4 mg IV q.6h. Pain control advance diet   3. Elevated LFTs. trending down nicely unclear why elevated tylenol use every night?  4. Diarrhea.- resolving   5. Hypernatremia. Resolved  6. Bipolar disorder - on lithium and BuSpar. Seen by psyche resume meds   7. DVT- ppx      PENDING TEST RESULTS:   At the time of discharge the following test results are still pending:     FOLLOW UP APPOINTMENTS:    Follow-up Information    Follow up With Details Comments Contact Info    Shelle Iron, MD  for primary care [address]    [address] [place] [address]  [phone]      Jayant P. Tally Joe, MD  for abdominal pain [address]  [address] [place] [address]  [phone]             ADDITIONAL CARE RECOMMENDATIONS:     DIET: Regular Diet      ACTIVITY: Activity as tolerated    WOUND CARE:     EQUIPMENT needed:       DISCHARGE MEDICATIONS:  Discharge Medication List as of 03/27/2013  2:31 PM      CONTINUE these medications which have NOT CHANGED    Details   pantoprazole (PROTONIX) 40 mg tablet Take 1 Tab by mouth daily for 30 days., Print, Disp-30 Tab, R-0      sucralfate (CARAFATE) 1 gram tablet Take 1 Tab by mouth four (4) times  daily for 15 days., Print, Disp-60 Tab, R-0      HYDROmorphone (DILAUDID) 2 mg tablet Take 1 Tab by mouth every four (4) hours as needed for Pain for up to 5 days. Max Daily Amount: 12 mg., Print, Disp-20 Tab, R-0      lithium carbonate 300 mg capsule Take  by mouth three (3) times daily (with meals)., Historical Med      busPIRone (BUSPAR) 10 mg tablet Take 10 mg by mouth three (3) times daily., Historical Med      Omega-3 Fatty Acids 300 mg cap Take  by mouth., Historical Med      calcium 500 mg tab Take  by mouth., Historical Med      magnesium 250 mg tab Take  by mouth., Historical Med      coenzyme q10 (CO Q-10) 10 mg cap Take  by mouth., Historical Med      cyanocobalamin (VITAMIN B12) 1,000 mcg/mL injection 1,000 mcg by IntraMUSCular route every thirty (30) days., Historical Med  NOTIFY YOUR PHYSICIAN FOR ANY OF THE FOLLOWING:   Fever over 101 degrees for 24 hours.   Chest pain, shortness of breath, fever, chills, nausea, vomiting, diarrhea, change in mentation, falling, weakness, bleeding. Severe pain or pain not relieved by medications.  Or, any other signs or symptoms that you may have questions about.    DISPOSITION:  x  Home With:   OT  PT  HH  RN       Long term SNF/Inpatient Rehab    Independent/assisted living    Hospice    Other:       PATIENT CONDITION AT DISCHARGE:     Functional status    Poor     Deconditioned    x Independent      Cognition   x  Lucid     Forgetful     Dementia      Catheters/lines (plus indication)    Foley     PICC     PEG    x None      Code status   x  Full code     DNR      PHYSICAL EXAMINATION AT DISCHARGE:   Refer to Progress Note      CHRONIC MEDICAL DIAGNOSES:  Problem List as of 03/27/2013 Date Reviewed: 03/25/2013        ICD-9-CM Class Noted - Resolved     RESOLVED: Intractable abdominal pain  789.00   03/25/2013 - 03/26/2013              Greater than 30 minutes were spent with the patient on counseling and coordination of care    Signed:   Lucky RathkeMahmud Dillin Lofgren, MD   03/28/2013  2:23 PM

## 2013-06-27 DIAGNOSIS — Z8719 Personal history of other diseases of the digestive system: Secondary | ICD-10-CM | POA: Insufficient documentation

## 2013-07-25 LAB — LITHIUM: Lithium level: <0.2 MMOL/L — ABNORMAL LOW (ref 0.60–1.20)

## 2015-09-02 DIAGNOSIS — G932 Benign intracranial hypertension: Secondary | ICD-10-CM | POA: Insufficient documentation

## 2015-09-02 DIAGNOSIS — Z9884 Bariatric surgery status: Secondary | ICD-10-CM | POA: Insufficient documentation

## 2015-09-02 DIAGNOSIS — G43719 Chronic migraine without aura, intractable, without status migrainosus: Secondary | ICD-10-CM | POA: Insufficient documentation

## 2015-10-22 DIAGNOSIS — K9589 Other complications of other bariatric procedure: Secondary | ICD-10-CM | POA: Insufficient documentation

## 2016-05-16 DIAGNOSIS — F3181 Bipolar II disorder: Secondary | ICD-10-CM | POA: Insufficient documentation

## 2016-06-28 DIAGNOSIS — G932 Benign intracranial hypertension: Secondary | ICD-10-CM | POA: Insufficient documentation

## 2016-06-28 DIAGNOSIS — T884XXA Failed or difficult intubation, initial encounter: Secondary | ICD-10-CM | POA: Insufficient documentation

## 2018-02-28 DIAGNOSIS — R9431 Abnormal electrocardiogram [ECG] [EKG]: Secondary | ICD-10-CM | POA: Insufficient documentation

## 2018-02-28 DIAGNOSIS — R600 Localized edema: Secondary | ICD-10-CM | POA: Insufficient documentation

## 2019-06-23 DIAGNOSIS — F1123 Opioid dependence with withdrawal: Secondary | ICD-10-CM | POA: Insufficient documentation

## 2019-12-25 DIAGNOSIS — K56600 Partial intestinal obstruction, unspecified as to cause: Secondary | ICD-10-CM | POA: Insufficient documentation

## 2019-12-27 DIAGNOSIS — K289 Gastrojejunal ulcer, unspecified as acute or chronic, without hemorrhage or perforation: Secondary | ICD-10-CM | POA: Insufficient documentation

## 2020-01-06 DIAGNOSIS — M170 Bilateral primary osteoarthritis of knee: Secondary | ICD-10-CM | POA: Insufficient documentation

## 2020-03-02 DIAGNOSIS — R197 Diarrhea, unspecified: Secondary | ICD-10-CM | POA: Insufficient documentation

## 2020-03-24 DIAGNOSIS — F1011 Alcohol abuse, in remission: Secondary | ICD-10-CM | POA: Insufficient documentation

## 2020-05-18 DIAGNOSIS — D649 Anemia, unspecified: Secondary | ICD-10-CM | POA: Insufficient documentation

## 2020-06-16 DIAGNOSIS — R1011 Right upper quadrant pain: Secondary | ICD-10-CM | POA: Insufficient documentation

## 2020-08-27 DIAGNOSIS — E871 Hypo-osmolality and hyponatremia: Secondary | ICD-10-CM | POA: Insufficient documentation

## 2020-11-21 ENCOUNTER — Emergency Department
Admission: EM | Admit: 2020-11-21 | Discharge: 2020-11-21 | Disposition: A | Discharge status: Home / Self Care | Payer: Self-pay | Attending: Emergency Medicine | Admitting: Emergency Medicine

## 2020-11-21 ENCOUNTER — Encounter: Payer: Self-pay | Admitting: Emergency Medicine

## 2020-11-21 ENCOUNTER — Other Ambulatory Visit: Payer: Self-pay

## 2020-11-21 DIAGNOSIS — F1010 Alcohol abuse, uncomplicated: Secondary | ICD-10-CM

## 2020-11-21 DIAGNOSIS — F10129 Alcohol abuse with intoxication, unspecified: Secondary | ICD-10-CM | POA: Insufficient documentation

## 2020-11-21 LAB — CBC
HCT: 34.8 % — ABNORMAL LOW (ref 36.0–46.0)
Hemoglobin: 10.1 g/dL — ABNORMAL LOW (ref 12.0–15.0)
MCH: 17.1 pg — ABNORMAL LOW (ref 26.0–34.0)
MCHC: 29 g/dL — ABNORMAL LOW (ref 30.0–36.0)
MCV: 58.8 fL — ABNORMAL LOW (ref 80.0–100.0)
Platelets: 418 K/uL — ABNORMAL HIGH (ref 150–400)
RBC: 5.92 MIL/uL — ABNORMAL HIGH (ref 3.87–5.11)
RDW: 21.1 % — ABNORMAL HIGH (ref 11.5–15.5)
WBC: 13.2 K/uL — ABNORMAL HIGH (ref 4.0–10.5)
nRBC: 0 % (ref 0.0–0.2)

## 2020-11-21 NOTE — ED Notes (Signed)
MD Archie Balboa at bedside with patient. MD Archie Balboa explained available resources. Patient states she does not wish to speak with psychiatry or counselor about detox facilities.

## 2020-11-21 NOTE — ED Triage Notes (Signed)
Pt presents via [facility]ms with c/o alcohol intoxication. Patient states in past when she has drank, she has had seizures as a result. Pt states she drank 24 beers today. Pt denies SI/HI, AH/VH at this time.

## 2020-11-21 NOTE — ED Provider Notes (Signed)
[facility] Emergency Department Provider Note   ____________________________________________   I have reviewed the triage vital signs and the nursing notes.   HISTORY  Chief Complaint Alcohol Intoxication   History limited by: Not Limited   HPI Vanessa Romero is a 45 y.o. female who presents to the emergency department today because of concerns for alcohol abuse.  Patient states she has a history of alcoholism.  She states that she last drank today.  When asked however she states she does not want help to be placed in a detox facility.  Slightly unclear why she decided to come to the emergency department today.  Records reviewed. Per medical record review patient has a history of alcohol abuse.   Prior to Admission medications   Not on File    Allergies Patient has no known allergies.  History reviewed. No pertinent family history.  Social History  Alcohol use  Review of Systems Constitutional: No fever/chills Eyes: No visual changes. ENT: No sore throat. Cardiovascular: Denies chest pain. Respiratory: Denies shortness of breath. Gastrointestinal: No abdominal pain.  No nausea, no vomiting.  No diarrhea.   Genitourinary: Negative for dysuria. Musculoskeletal: Negative for back pain. Skin: Negative for rash. Neurological: Negative for headaches, focal weakness or numbness.  ____________________________________________   PHYSICAL EXAM:  VITAL SIGNS: ED Triage Vitals [11/21/20 1505]  Enc Vitals Group     BP 124/86     Pulse Rate (!) 118     Resp 20     Temp 97.8 F (36.6 C)     Temp Source Oral     SpO2 98 %     Weight      Height      Head Circumference      Peak Flow      Pain Score 0   Constitutional: Alert and oriented.  Eyes: Conjunctivae are normal.  ENT      Head: Normocephalic and atraumatic.      Nose: No congestion/rhinnorhea.      Mouth/Throat: Mucous membranes are moist.      Neck: No  stridor. Hematological/Lymphatic/Immunilogical: No cervical lymphadenopathy. Cardiovascular: Normal rate, regular rhythm.  No murmurs, rubs, or gallops.  Respiratory: Normal respiratory effort without tachypnea nor retractions. Breath sounds are clear and equal bilaterally. No wheezes/rales/rhonchi. Gastrointestinal: Soft and non tender. No rebound. No guarding.  Genitourinary: Deferred Musculoskeletal: Normal range of motion in all extremities. No lower extremity edema. Neurologic:  Normal speech and language. No gross focal neurologic deficits are appreciated.  Skin:  Skin is warm, dry and intact. No rash noted. Psychiatric: Mood and affect are normal. Speech and behavior are normal. Patient exhibits appropriate insight and judgment.  ____________________________________________    LABS (pertinent positives/negatives)  CBC wbc 13.2, hgb 10.1, plt 418  ____________________________________________   EKG  None  ____________________________________________    RADIOLOGY  None  ____________________________________________   PROCEDURES  Procedures  ____________________________________________   INITIAL IMPRESSION / ASSESSMENT AND PLAN / ED COURSE  Pertinent labs & imaging results that were available during my care of the patient were reviewed by me and considered in my medical decision making (see chart for details).   Patient presents to the emergency department today after having used alcohol.  She states that she is a heavy user of alcohol.  I did asked the patient if she wanted help with detox.  She declined placement in a detox facility.  She then asked to be discharged.  I did again ask to see if I could  have behavioral health come talk to the patient getting potentially placed in detox facility as I do think this would be the best option for her. However she again requested discharge and started calling her husband on the telephone prior to me leaving the  bedside.  ____________________________________________   FINAL CLINICAL IMPRESSION(S) / ED DIAGNOSES  Final diagnoses:  Alcohol abuse     Note: This dictation was prepared with Dragon dictation. Any transcriptional errors that result from this process are unintentional     Nance Pear, MD 11/21/20 1537

## 2020-11-21 NOTE — Discharge Instructions (Addendum)
We are sorry that you did not want [place] to help find you a detox facility today. Many times detox facilities are the best way to get off of alcohol. If you change your mind and decide you would like to pursue going to a detox facility please return to the emergency department. Additionally you have been given the number for [facility], a detox facility in the area.

## 2020-11-21 NOTE — ED Notes (Signed)
Pt refusing treatment at this time. This RN explained procedure of talking to the psych team and counselor for them to set patient up with outpatient resources. Patient refused and does not wish to speak with them. Patient states "I can find help on my own". Pt denies SI/HI, AH/VH. Patient calling ride home at this time. Patient noted to be walking to the lobby prior to this Rn being able to obtain vitals or AMA signature. Pt with steady gait and appears to be in NAD. This RN notified security that patient would be waiting in lobby for ride home.

## 2020-11-22 ENCOUNTER — Observation Stay: Payer: Medicare Other

## 2020-11-22 ENCOUNTER — Encounter: Payer: Self-pay | Admitting: Internal Medicine

## 2020-11-22 ENCOUNTER — Emergency Department: Payer: Medicare Other

## 2020-11-22 ENCOUNTER — Inpatient Hospital Stay
Admission: EM | Admit: 2020-11-22 | Discharge: 2020-11-27 | DRG: 744 | Disposition: A | Discharge status: Home / Self Care | Payer: Medicare Other | Attending: Internal Medicine | Admitting: Internal Medicine

## 2020-11-22 DIAGNOSIS — F32A Depression, unspecified: Secondary | ICD-10-CM

## 2020-11-22 DIAGNOSIS — R519 Headache, unspecified: Secondary | ICD-10-CM | POA: Diagnosis not present

## 2020-11-22 DIAGNOSIS — F17290 Nicotine dependence, other tobacco product, uncomplicated: Secondary | ICD-10-CM | POA: Diagnosis present

## 2020-11-22 DIAGNOSIS — Z681 Body mass index (BMI) 19 or less, adult: Secondary | ICD-10-CM

## 2020-11-22 DIAGNOSIS — D62 Acute posthemorrhagic anemia: Secondary | ICD-10-CM | POA: Diagnosis present

## 2020-11-22 DIAGNOSIS — G40909 Epilepsy, unspecified, not intractable, without status epilepticus: Secondary | ICD-10-CM

## 2020-11-22 DIAGNOSIS — Z820 Family history of epilepsy and other diseases of the nervous system: Secondary | ICD-10-CM

## 2020-11-22 DIAGNOSIS — C539 Malignant neoplasm of cervix uteri, unspecified: Principal | ICD-10-CM | POA: Diagnosis present

## 2020-11-22 DIAGNOSIS — Z8249 Family history of ischemic heart disease and other diseases of the circulatory system: Secondary | ICD-10-CM

## 2020-11-22 DIAGNOSIS — Z20822 Contact with and (suspected) exposure to covid-19: Secondary | ICD-10-CM | POA: Diagnosis present

## 2020-11-22 DIAGNOSIS — Z8041 Family history of malignant neoplasm of ovary: Secondary | ICD-10-CM

## 2020-11-22 DIAGNOSIS — N939 Abnormal uterine and vaginal bleeding, unspecified: Secondary | ICD-10-CM | POA: Diagnosis not present

## 2020-11-22 DIAGNOSIS — F10939 Alcohol use, unspecified with withdrawal, unspecified: Secondary | ICD-10-CM

## 2020-11-22 DIAGNOSIS — Z9884 Bariatric surgery status: Secondary | ICD-10-CM

## 2020-11-22 DIAGNOSIS — N888 Other specified noninflammatory disorders of cervix uteri: Secondary | ICD-10-CM

## 2020-11-22 DIAGNOSIS — Y907 Blood alcohol level of 200-239 mg/100 ml: Secondary | ICD-10-CM | POA: Diagnosis present

## 2020-11-22 DIAGNOSIS — F10930 Alcohol use, unspecified with withdrawal, uncomplicated: Secondary | ICD-10-CM

## 2020-11-22 DIAGNOSIS — R55 Syncope and collapse: Secondary | ICD-10-CM

## 2020-11-22 DIAGNOSIS — E876 Hypokalemia: Secondary | ICD-10-CM | POA: Diagnosis present

## 2020-11-22 DIAGNOSIS — E538 Deficiency of other specified B group vitamins: Secondary | ICD-10-CM | POA: Diagnosis present

## 2020-11-22 DIAGNOSIS — Z808 Family history of malignant neoplasm of other organs or systems: Secondary | ICD-10-CM

## 2020-11-22 DIAGNOSIS — K922 Gastrointestinal hemorrhage, unspecified: Secondary | ICD-10-CM | POA: Diagnosis present

## 2020-11-22 DIAGNOSIS — R569 Unspecified convulsions: Secondary | ICD-10-CM

## 2020-11-22 DIAGNOSIS — E43 Unspecified severe protein-calorie malnutrition: Secondary | ICD-10-CM | POA: Diagnosis present

## 2020-11-22 DIAGNOSIS — J690 Pneumonitis due to inhalation of food and vomit: Secondary | ICD-10-CM

## 2020-11-22 DIAGNOSIS — E559 Vitamin D deficiency, unspecified: Secondary | ICD-10-CM | POA: Diagnosis present

## 2020-11-22 DIAGNOSIS — K529 Noninfective gastroenteritis and colitis, unspecified: Secondary | ICD-10-CM | POA: Diagnosis present

## 2020-11-22 DIAGNOSIS — I517 Cardiomegaly: Secondary | ICD-10-CM | POA: Diagnosis present

## 2020-11-22 DIAGNOSIS — D6959 Other secondary thrombocytopenia: Secondary | ICD-10-CM | POA: Diagnosis present

## 2020-11-22 DIAGNOSIS — Z800 Family history of malignant neoplasm of digestive organs: Secondary | ICD-10-CM

## 2020-11-22 DIAGNOSIS — Z8371 Family history of colonic polyps: Secondary | ICD-10-CM

## 2020-11-22 DIAGNOSIS — Z8711 Personal history of peptic ulcer disease: Secondary | ICD-10-CM

## 2020-11-22 DIAGNOSIS — N83201 Unspecified ovarian cyst, right side: Secondary | ICD-10-CM | POA: Diagnosis present

## 2020-11-22 DIAGNOSIS — Z833 Family history of diabetes mellitus: Secondary | ICD-10-CM

## 2020-11-22 DIAGNOSIS — N83209 Unspecified ovarian cyst, unspecified side: Secondary | ICD-10-CM

## 2020-11-22 DIAGNOSIS — Z79899 Other long term (current) drug therapy: Secondary | ICD-10-CM

## 2020-11-22 DIAGNOSIS — W19XXXA Unspecified fall, initial encounter: Secondary | ICD-10-CM | POA: Diagnosis present

## 2020-11-22 DIAGNOSIS — F10239 Alcohol dependence with withdrawal, unspecified: Secondary | ICD-10-CM | POA: Diagnosis present

## 2020-11-22 DIAGNOSIS — F17200 Nicotine dependence, unspecified, uncomplicated: Secondary | ICD-10-CM

## 2020-11-22 DIAGNOSIS — F419 Anxiety disorder, unspecified: Secondary | ICD-10-CM

## 2020-11-22 HISTORY — DX: Alcohol abuse, uncomplicated: F10.10

## 2020-11-22 HISTORY — DX: Nicotine dependence, unspecified, uncomplicated: F17.200

## 2020-11-22 LAB — CK: Total CK: 306 U/L — ABNORMAL HIGH (ref 38–234)

## 2020-11-22 LAB — CBC WITH DIFFERENTIAL/PLATELET
Abs Immature Granulocytes: 0.09 10*3/uL — ABNORMAL HIGH (ref 0.00–0.07)
Basophils Absolute: 0.1 10*3/uL (ref 0.0–0.1)
Basophils Relative: 1 %
Eosinophils Absolute: 0 10*3/uL (ref 0.0–0.5)
Eosinophils Relative: 0 %
HCT: 32.3 % — ABNORMAL LOW (ref 36.0–46.0)
Hemoglobin: 9.7 g/dL — ABNORMAL LOW (ref 12.0–15.0)
Immature Granulocytes: 1 %
Lymphocytes Relative: 9 %
Lymphs Abs: 1.3 10*3/uL (ref 0.7–4.0)
MCH: 17.7 pg — ABNORMAL LOW (ref 26.0–34.0)
MCHC: 30 g/dL (ref 30.0–36.0)
MCV: 58.9 fL — ABNORMAL LOW (ref 80.0–100.0)
Monocytes Absolute: 0.7 10*3/uL (ref 0.1–1.0)
Monocytes Relative: 5 %
Neutro Abs: 12 10*3/uL — ABNORMAL HIGH (ref 1.7–7.7)
Neutrophils Relative %: 84 %
Platelets: 398 10*3/uL (ref 150–400)
RBC: 5.48 MIL/uL — ABNORMAL HIGH (ref 3.87–5.11)
RDW: 21.2 % — ABNORMAL HIGH (ref 11.5–15.5)
Smear Review: NORMAL
WBC: 14.1 10*3/uL — ABNORMAL HIGH (ref 4.0–10.5)
nRBC: 0 % (ref 0.0–0.2)

## 2020-11-22 LAB — TROPONIN I (HIGH SENSITIVITY)
Troponin I (High Sensitivity): 11 ng/L (ref <18)
Troponin I (High Sensitivity): 9 ng/L (ref <18)

## 2020-11-22 LAB — RESP PANEL BY RT-PCR (FLU A&B, COVID) ARPGX2
Influenza A by PCR: NEGATIVE
Influenza B by PCR: NEGATIVE
SARS Coronavirus 2 by RT PCR: NEGATIVE

## 2020-11-22 LAB — HIV ANTIBODY (ROUTINE TESTING W REFLEX): HIV Screen 4th Generation wRfx: NONREACTIVE

## 2020-11-22 LAB — COMPREHENSIVE METABOLIC PANEL
ALT: 76 U/L — ABNORMAL HIGH (ref 0–44)
AST: 153 U/L — ABNORMAL HIGH (ref 15–41)
Albumin: 4.2 g/dL (ref 3.5–5.0)
Alkaline Phosphatase: 73 U/L (ref 38–126)
Anion gap: 20 — ABNORMAL HIGH (ref 5–15)
BUN: 7 mg/dL (ref 6–20)
CO2: 21 mmol/L — ABNORMAL LOW (ref 22–32)
Calcium: 9 mg/dL (ref 8.9–10.3)
Chloride: 94 mmol/L — ABNORMAL LOW (ref 98–111)
Creatinine, Ser: 0.44 mg/dL (ref 0.44–1.00)
GFR, Estimated: >60 mL/min (ref >60)
Glucose, Bld: 99 mg/dL (ref 70–99)
Potassium: 4.1 mmol/L (ref 3.5–5.1)
Sodium: 135 mmol/L (ref 135–145)
Total Bilirubin: 0.9 mg/dL (ref 0.3–1.2)
Total Protein: 7.4 g/dL (ref 6.5–8.1)

## 2020-11-22 LAB — PHOSPHORUS: Phosphorus: 5.6 mg/dL — ABNORMAL HIGH (ref 2.5–4.6)

## 2020-11-22 LAB — MAGNESIUM: Magnesium: 2 mg/dL (ref 1.7–2.4)

## 2020-11-22 LAB — ETHANOL: Alcohol, Ethyl (B): 228 mg/dL — ABNORMAL HIGH (ref <10)

## 2020-11-22 LAB — VITAMIN B12: Vitamin B-12: 262 pg/mL (ref 180–914)

## 2020-11-22 LAB — PROCALCITONIN: Procalcitonin: <0.1 ng/mL

## 2020-11-22 MED ORDER — ONDANSETRON HCL 4 MG PO TABS: 4.0000 mg | ORAL_TABLET | Freq: Four times a day (QID) | ORAL | Status: AC | PRN

## 2020-11-22 MED ORDER — ONDANSETRON HCL 4 MG/2ML IJ SOLN
4.0000 mg | Freq: Four times a day (QID) | INTRAMUSCULAR | Status: AC | PRN
  Administered 2020-11-23 – 2020-11-24 (×5): 4 mg via INTRAVENOUS
  Filled 2020-11-22 (×5): qty 2

## 2020-11-22 MED ORDER — ACETAMINOPHEN 325 MG PO TABS
650.0000 mg | ORAL_TABLET | Freq: Four times a day (QID) | ORAL | Status: AC | PRN
  Administered 2020-11-23 – 2020-11-24 (×3): 650 mg via ORAL
  Filled 2020-11-22 (×3): qty 2

## 2020-11-22 MED ORDER — ENOXAPARIN SODIUM 40 MG/0.4ML IJ SOSY
40.0000 mg | PREFILLED_SYRINGE | INTRAMUSCULAR | Status: DC
  Administered 2020-11-22: 40 mg via SUBCUTANEOUS
  Filled 2020-11-22: qty 0.4

## 2020-11-22 MED ORDER — THIAMINE HCL 100 MG PO TABS: 100.0000 mg | ORAL_TABLET | Freq: Every day | ORAL | Status: DC

## 2020-11-22 MED ORDER — THIAMINE HCL 100 MG/ML IJ SOLN
200.0000 mg | Freq: Once | INTRAVENOUS | Status: AC
  Administered 2020-11-22: 200 mg via INTRAVENOUS
  Filled 2020-11-22: qty 2

## 2020-11-22 MED ORDER — LIDOCAINE 5 % EX PTCH
1.0000 | MEDICATED_PATCH | CUTANEOUS | Status: AC
  Administered 2020-11-22: 1 via TRANSDERMAL
  Filled 2020-11-22 (×2): qty 1

## 2020-11-22 MED ORDER — FOLIC ACID 1 MG PO TABS
1.0000 mg | ORAL_TABLET | Freq: Every day | ORAL | Status: DC
  Administered 2020-11-23 – 2020-11-27 (×4): 1 mg via ORAL
  Filled 2020-11-22 (×4): qty 1

## 2020-11-22 MED ORDER — THIAMINE HCL 100 MG/ML IJ SOLN: 100.0000 mg | Freq: Every day | INTRAMUSCULAR | Status: DC

## 2020-11-22 MED ORDER — LORAZEPAM 2 MG/ML IJ SOLN
1.0000 mg | INTRAMUSCULAR | Status: AC | PRN
  Administered 2020-11-22: 1 mg via INTRAVENOUS
  Administered 2020-11-22 (×7): 2 mg via INTRAVENOUS
  Administered 2020-11-23: 1 mg via INTRAVENOUS
  Administered 2020-11-23 – 2020-11-25 (×8): 2 mg via INTRAVENOUS
  Administered 2020-11-25: 4 mg via INTRAVENOUS
  Administered 2020-11-25: 2 mg via INTRAVENOUS
  Filled 2020-11-22 (×3): qty 1
  Filled 2020-11-22: qty 2
  Filled 2020-11-22 (×15): qty 1

## 2020-11-22 MED ORDER — LAMOTRIGINE 25 MG PO TABS
50.0000 mg | ORAL_TABLET | Freq: Every day | ORAL | Status: DC
  Administered 2020-11-22 – 2020-11-27 (×5): 50 mg via ORAL
  Filled 2020-11-22 (×5): qty 2

## 2020-11-22 MED ORDER — THIAMINE HCL 100 MG/ML IJ SOLN
100.0000 mg | Freq: Every day | INTRAMUSCULAR | Status: DC
  Administered 2020-11-23: 100 mg via INTRAVENOUS
  Filled 2020-11-22 (×2): qty 2

## 2020-11-22 MED ORDER — CHLORDIAZEPOXIDE HCL 25 MG PO CAPS
25.0000 mg | ORAL_CAPSULE | Freq: Once | ORAL | Status: AC
  Administered 2020-11-22: 25 mg via ORAL
  Filled 2020-11-22: qty 1

## 2020-11-22 MED ORDER — LORAZEPAM 2 MG/ML IJ SOLN
1.0000 mg | Freq: Once | INTRAMUSCULAR | Status: AC
  Administered 2020-11-22: 1 mg via INTRAVENOUS
  Filled 2020-11-22: qty 1

## 2020-11-22 MED ORDER — SODIUM CHLORIDE 0.9 % IV BOLUS
1000.0000 mL | Freq: Once | INTRAVENOUS | Status: AC
  Administered 2020-11-22: 1000 mL via INTRAVENOUS

## 2020-11-22 MED ORDER — SODIUM CHLORIDE 0.9 % IV SOLN: INTRAVENOUS | Status: AC

## 2020-11-22 MED ORDER — LORAZEPAM 1 MG PO TABS
1.0000 mg | ORAL_TABLET | ORAL | Status: AC | PRN
  Administered 2020-11-24: 1 mg via ORAL
  Administered 2020-11-25: 2 mg via ORAL
  Filled 2020-11-22: qty 1
  Filled 2020-11-22 (×2): qty 2

## 2020-11-22 MED ORDER — LIDOCAINE VISCOUS HCL 2 % MT SOLN
15.0000 mL | Freq: Once | OROMUCOSAL | Status: AC
  Administered 2020-11-22: 15 mL via ORAL
  Filled 2020-11-22: qty 15

## 2020-11-22 MED ORDER — ACETAMINOPHEN 650 MG RE SUPP
650.0000 mg | Freq: Four times a day (QID) | RECTAL | Status: AC | PRN
  Filled 2020-11-22: qty 1

## 2020-11-22 MED ORDER — ADULT MULTIVITAMIN W/MINERALS CH
1.0000 | ORAL_TABLET | Freq: Every day | ORAL | Status: DC
  Administered 2020-11-24 – 2020-11-27 (×3): 1 via ORAL
  Filled 2020-11-22 (×3): qty 1

## 2020-11-22 MED ORDER — ALUM & MAG HYDROXIDE-SIMETH 200-200-20 MG/5ML PO SUSP
30.0000 mL | Freq: Once | ORAL | Status: AC
  Administered 2020-11-22: 30 mL via ORAL
  Filled 2020-11-22: qty 30

## 2020-11-22 MED ORDER — HYDROXYZINE HCL 10 MG PO TABS
10.0000 mg | ORAL_TABLET | Freq: Three times a day (TID) | ORAL | Status: DC | PRN
  Administered 2020-11-22 – 2020-11-27 (×4): 10 mg via ORAL
  Filled 2020-11-22 (×7): qty 1

## 2020-11-22 MED ORDER — CLONIDINE HCL 0.1 MG PO TABS: 0.2000 mg | ORAL_TABLET | Freq: Two times a day (BID) | ORAL | Status: DC | PRN

## 2020-11-22 MED ORDER — PANTOPRAZOLE SODIUM 40 MG IV SOLR
40.0000 mg | Freq: Once | INTRAVENOUS | Status: AC
  Administered 2020-11-22: 40 mg via INTRAVENOUS
  Filled 2020-11-22: qty 40

## 2020-11-22 MED ORDER — ESCITALOPRAM OXALATE 20 MG PO TABS
20.0000 mg | ORAL_TABLET | Freq: Every day | ORAL | Status: DC
  Administered 2020-11-22 – 2020-11-27 (×4): 20 mg via ORAL
  Filled 2020-11-22 (×2): qty 1
  Filled 2020-11-22: qty 2
  Filled 2020-11-22 (×2): qty 1

## 2020-11-22 NOTE — ED Triage Notes (Addendum)
Pt to ED [facility] from home for detox from ETOH and +LOC after fall today.  Last drink at 0300 this am.  Per EMS pt possibly fell this am, unsure why, unsure of hitting head. Pt states does not remember what happened. Alert and oriented, clear speech  Hx seizures, pt did not loss control of bladder.   22g Left index finger EMS, 219mL LR given PTA 4mg  zofran given PTA   No tremors noted. +headache. +anxiety/agitation. No sweat visible.   Denies SI/HI/hullucinations  Pt seen for same yesterday and d/c

## 2020-11-22 NOTE — ED Notes (Signed)
Pt reports there is no chance of pregnancy, CT notified.

## 2020-11-22 NOTE — H&P (Signed)
History and Physical   Vanessa Romero JSH:[id number] DOB: [birth year] DOA: 11/22/2020  PCP: Anselmo Pickler, MD  Outpatient Specialists: Dr. Veatrice Kells GI Patient coming from: home  I have personally briefly reviewed patient's old medical records in [place].  Chief Concern: etoh detox  HPI: Vanessa Romero is a 45 y.o. female with medical history significant for alcohol abuse, current alcohol dependence, tobacco dependence, who presents to the ED for chief concerns of alcohol detoxification.   She denies known sick contacts, dysuria, abdominal pain, fever. She endorses waking up in stupor, unclear how she was on the ground. She endorses two days of nausea, vomiting, diarrhea. She reports the vomitus is green and yellow in color and also the diarrhea. She reports this is simlar to episodes of alcohol withdrawel in the past.   Social history: She lives at home with her parents and husband. She endorses current vaping and alcohol abuse. She denies recreational drug use.   Vaccination history: unknown  ROS: Constitutional: no weight change, no fever ENT/Mouth: no sore throat, no rhinorrhea Eyes: no eye pain, no vision changes Cardiovascular: no chest pain, no dyspnea,  no edema, no palpitations Respiratory: no cough, no sputum, no wheezing Gastrointestinal: + nausea, + vomiting, + diarrhea, no constipation Genitourinary: no urinary incontinence, no dysuria, no hematuria Musculoskeletal: no arthralgias, no myalgias Skin: no skin lesions, no pruritus, Neuro: + weakness, + loss of consciousness, no syncope Psych: no anxiety, no depression, + decrease appetite Heme/Lymph: no bruising, no bleeding  ED Course: Discussed with emergency medicine provider, patient requiring hospitalization for chief concerns of alcohol withdrawal.  Vitals in the emergency department was remarkable for temperature of 97.6, respiration rate of 22, heart rate of 131, heart rate improved to  107, blood pressure 130/88, SPO2 of 93% on room air.  Labs in the emergency department was remarkable for sodium 135, potassium 4.1, chloride 94, bicarb 21, BUN of 7, serum creatinine of 0.44, nonfasting blood glucose 99, GFR greater than 60, WBC 14.1, hemoglobin 9.7, platelets 398.  EtOH level was 228.  Assessment/Plan  Principal Problem:   Alcohol withdrawal (HCC) Active Problems:   Tobacco dependence   Depression   LVH (left ventricular hypertrophy)   Chronic diarrhea   # Alcohol withdrawal-CIWA protocol, thiamine, phosphorus, folic, multivitamin, magnesium check - Check pregnancy urine, CK, procalcitonin - Admit to progressive cardiac, observation, telemetry - Counseling given to stop alcohol cessation - Patient endorses understanding and compliance - Fall precautions, aspiration precaution  # Chronic diarrhea-patient had an outpatient scheduled colonoscopy at [facility] in [place] on day of admission - As patient is currently withdrawing from alcohol at this time, GI consultation has been deferred - Patient would benefit from outpatient follow-up with her gastroenterologist as patient does not meet criteria for an urgent GI consultation inpatient at this time  # History of seizure disorder-patient has not taken her Lamictal in the past week - Resume lamotrigine 50 mg daily - DC precautions  # Depression-patient would benefit from outpatient referral for psychiatric evaluation versus therapy  # History of LVH - presumed secondary to alcohol abuse and dependence - Clonidine 0.2 mg p.o. twice daily as needed for SBP greater than 140, 2 doses ordered for alpha-2 agonist  Check urine pregnancy  A.m. labs: PTT, INR, PT, BMP, CBC in the a.m.  Chart reviewed.   DVT prophylaxis: Enoxaparin Code Status: Full code Diet: Heart healthy Family Communication: No Disposition Plan: Pending clinical course Consults called: None at this time Admission status: Progressive  cardiac,  observation, telemetry  Past Medical History:  Diagnosis Date   Alcohol abuse    Tobacco dependence    History reviewed. No pertinent surgical history.  Social History:  reports that she has quit smoking. Her smoking use included cigarettes. She uses smokeless tobacco. She reports current alcohol use. She reports that she does not currently use drugs.  No Known Allergies History reviewed. No pertinent family history. Family history: Family history reviewed and not pertinent  Prior to Admission medications   lamictal   Physical Exam: Vitals:   11/22/20 0921 11/22/20 0922 11/22/20 1202 11/22/20 1439  BP: 130/88  128/90 122/76  Pulse: (!) 131  (!) 107 (!) 126  Resp: (!) 22  16 19   Temp: 97.6 F (36.4 C)     TempSrc: Oral     SpO2:  93% 95% 96%  Weight:  54.4 kg    Height:  5\' 7"  (1.702 m)     Constitutional: appears age-appropriate, NAD, calm, comfortable Eyes: PERRL, lids and conjunctivae normal ENMT: Mucous membranes are moist. Posterior pharynx clear of any exudate or lesions. Age-appropriate dentition. Hearing appropriate Neck: normal, supple, no masses, no thyromegaly Respiratory: clear to auscultation bilaterally, no wheezing, no crackles. Normal respiratory effort. No accessory muscle use.  Cardiovascular: Regular rate and rhythm, no murmurs / rubs / gallops. No extremity edema. 2+ pedal pulses. No carotid bruits.  Abdomen: Scaphoid abdomen, no tenderness, no masses palpated, no hepatosplenomegaly. Bowel sounds positive.  Musculoskeletal: no clubbing / cyanosis. No joint deformity upper and lower extremities. Good ROM, no contractures, no atrophy. Normal muscle tone.  Skin: no rashes, lesions, ulcers. No induration Neurologic: Sensation intact. Strength 5/5 in all 4.  Psychiatric: Normal judgment and insight. Alert and oriented x 3. Normal mood.   EKG: independently reviewed, showing sinus tachycardia with rate of 122, QTc 450, LVH is present  Chest x-ray on  Admission: I personally reviewed and I agree with radiologist reading as below.  DG Chest 2 View  Result Date: 11/22/2020 CLINICAL DATA:  Decreased consciousness EXAM: CHEST - 2 VIEW COMPARISON:  10/22/2015 from [facility] radiology. FINDINGS: Cholecystectomy clips. Midline trachea. Normal heart size and mediastinal contours. No pleural effusion or pneumothorax. Mediastinal surgical clips. Clear lungs. IMPRESSION: No acute cardiopulmonary disease. Electronically Signed   By: Abigail Miyamoto M.D.   On: 11/22/2020 13:33   CT Head Wo Contrast  Result Date: 11/22/2020 CLINICAL DATA:  Head trauma, abnormal mental status (Age 42-64y); Neck trauma, intoxicated or obtunded (Age >= 16y) EXAM: CT HEAD WITHOUT CONTRAST CT CERVICAL SPINE WITHOUT CONTRAST TECHNIQUE: Multidetector CT imaging of the head and cervical spine was performed following the standard protocol without intravenous contrast. Multiplanar CT image reconstructions of the cervical spine were also generated. COMPARISON:  None. FINDINGS: CT HEAD FINDINGS Brain: No evidence of acute large vascular territory infarction, hemorrhage, hydrocephalus, extra-axial collection or mass lesion/mass effect. Encephalomalacia in the left frontal lobe, extending to the left anterior periventricular white matter. Vascular: No hyperdense vessel identified. Skull: Left frontal and right occipital burr holes. No acute fracture. Sinuses/Orbits: Visualized sinuses are clear. Visualized orbits are unremarkable. Other: No mastoid effusions. CT CERVICAL SPINE FINDINGS Alignment: No substantial sagittal subluxation. Mild broad levocurvature. Skull base and vertebrae: No evidence of acute fracture. Vertebral body heights are maintained. Soft tissues and spinal canal: No prevertebral fluid or swelling. No visible canal hematoma. Approximately 8 mm right thyroid nodule, which is not require further imaging follow-up (ref: J Am Coll Radiol. 2015 Feb;12(2): 143-50). Disc levels:  Mild-to-moderate degenerative disease on the left at C6-C7 where there is endplate sclerosis, disc height loss and posterior endplate spurring. Upper chest: Visualized lung apices are clear. IMPRESSION: CT head: 1. No evidence of acute intracranial abnormality. 2. Left frontal lobe encephalomalacia. CT cervical spine: 1. No evidence of acute fracture or traumatic malalignment. 2. Mild-to-moderate C6-C7 degenerative change. Electronically Signed   By: Margaretha Sheffield M.D.   On: 11/22/2020 11:39   CT Cervical Spine Wo Contrast  Result Date: 11/22/2020 CLINICAL DATA:  Head trauma, abnormal mental status (Age 43-64y); Neck trauma, intoxicated or obtunded (Age >= 16y) EXAM: CT HEAD WITHOUT CONTRAST CT CERVICAL SPINE WITHOUT CONTRAST TECHNIQUE: Multidetector CT imaging of the head and cervical spine was performed following the standard protocol without intravenous contrast. Multiplanar CT image reconstructions of the cervical spine were also generated. COMPARISON:  None. FINDINGS: CT HEAD FINDINGS Brain: No evidence of acute large vascular territory infarction, hemorrhage, hydrocephalus, extra-axial collection or mass lesion/mass effect. Encephalomalacia in the left frontal lobe, extending to the left anterior periventricular white matter. Vascular: No hyperdense vessel identified. Skull: Left frontal and right occipital burr holes. No acute fracture. Sinuses/Orbits: Visualized sinuses are clear. Visualized orbits are unremarkable. Other: No mastoid effusions. CT CERVICAL SPINE FINDINGS Alignment: No substantial sagittal subluxation. Mild broad levocurvature. Skull base and vertebrae: No evidence of acute fracture. Vertebral body heights are maintained. Soft tissues and spinal canal: No prevertebral fluid or swelling. No visible canal hematoma. Approximately 8 mm right thyroid nodule, which is not require further imaging follow-up (ref: J Am Coll Radiol. 2015 Feb;12(2): 143-50). Disc levels: Mild-to-moderate  degenerative disease on the left at C6-C7 where there is endplate sclerosis, disc height loss and posterior endplate spurring. Upper chest: Visualized lung apices are clear. IMPRESSION: CT head: 1. No evidence of acute intracranial abnormality. 2. Left frontal lobe encephalomalacia. CT cervical spine: 1. No evidence of acute fracture or traumatic malalignment. 2. Mild-to-moderate C6-C7 degenerative change. Electronically Signed   By: Margaretha Sheffield M.D.   On: 11/22/2020 11:39    Labs on Admission: I have personally reviewed following labs  CBC: Recent Labs  Lab 11/21/20 1509 11/22/20 1014  WBC 13.2* 14.1*  NEUTROABS  --  12.0*  HGB 10.1* 9.7*  HCT 34.8* 32.3*  MCV 58.8* 58.9*  PLT 418* 592   Basic Metabolic Panel: Recent Labs  Lab 11/22/20 1014  NA 135  K 4.1  CL 94*  CO2 21*  GLUCOSE 99  BUN 7  CREATININE 0.44  CALCIUM 9.0  MG 2.0  PHOS 5.6*   GFR: Estimated Creatinine Clearance: 77.9 mL/min (by C-G formula based on SCr of 0.44 mg/dL).  Liver Function Tests: Recent Labs  Lab 11/22/20 1014  AST 153*  ALT 76*  ALKPHOS 73  BILITOT 0.9  PROT 7.4  ALBUMIN 4.2   Dr. Tobie Poet [facility]  If 7PM-7AM, please contact overnight-coverage provider If 7AM-7PM, please contact day coverage provider [email or web address]  11/22/2020, 2:54 PM

## 2020-11-22 NOTE — ED Notes (Signed)
VOL, medical admit

## 2020-11-22 NOTE — ED Notes (Signed)
Pt transported to CT ?

## 2020-11-22 NOTE — ED Notes (Signed)
MD Hal Hope) notified about the continued administration of Ativan with minimal improvement to this patient - MD advised to continue for a couple more hours and reevaluate.

## 2020-11-22 NOTE — ED Provider Notes (Signed)
[facility] Emergency Department Provider Note   ____________________________________________   Event Date/Time   First MD Initiated Contact with Patient 11/22/20 [phone]     (approximate)  I have reviewed the triage vital signs and the nursing notes.   HISTORY  Chief Complaint detox and Loss of Consciousness    HPI Vanessa Romero is a 45 y.o. female with past medical history of alcohol abuse who presents to the ED for alcohol withdrawal.  Patient reports that she previously had been sober for 2 years until she started drinking again about 1 week ago.  She reports drinking as many as 20 beers daily, denies liquor or wine consumption.  She states her last drink was around 3 AM and she has been feeling increasingly shaky and weak since then.  She believes she lost consciousness and fell to the ground at 1 point this morning, is unsure whether she hit her head.  She denies any fevers, cough, chest pain, or shortness of breath.  She reports "pain all over" since the fall.  She reports a history of seizures, but states these only occur when she stops drinking.  She is unsure whether she could have had a seizure today, states she does not usually take medication for seizures.        No past medical history on file.  There are no problems to display for this patient.   No past surgical history on file.  Prior to Admission medications   Not on File    Allergies Patient has no known allergies.  No family history on file.  Social History    Review of Systems  Constitutional: No fever/chills.  Positive for malaise and fatigue. Eyes: No visual changes. ENT: No sore throat. Cardiovascular: Denies chest pain.  Positive for syncope. Respiratory: Denies shortness of breath. Gastrointestinal: No abdominal pain.  No nausea, no vomiting.  No diarrhea.  No constipation. Genitourinary: Negative for dysuria. Musculoskeletal: Negative for back pain. Skin:  Negative for rash. Neurological: Negative for headaches, focal weakness or numbness.  Positive for tremors.  ____________________________________________   PHYSICAL EXAM:  VITAL SIGNS: ED Triage Vitals  Enc Vitals Group     BP 11/22/20 0921 130/88     Pulse Rate 11/22/20 0921 (!) 131     Resp 11/22/20 0921 (!) 22     Temp 11/22/20 0921 97.6 F (36.4 C)     Temp Source 11/22/20 0921 Oral     SpO2 11/22/20 0922 93 %     Weight 11/22/20 0922 120 lb (54.4 kg)     Height 11/22/20 0922 5\' 7"  (1.702 m)     Head Circumference --      Peak Flow --      Pain Score --      Pain Loc --      Pain Edu? --      Excl. in [place]? --     Constitutional: Alert and oriented. Eyes: Conjunctivae are normal. Head: Atraumatic. Nose: No congestion/rhinnorhea. Mouth/Throat: Mucous membranes are moist. Neck: Normal ROM Cardiovascular: Tachycardic, regular rhythm. Grossly normal heart sounds.  2+ radial pulses bilaterally. Respiratory: Normal respiratory effort.  No retractions. Lungs CTAB. Gastrointestinal: Soft and nontender. No distention. Genitourinary: deferred Musculoskeletal: No lower extremity tenderness nor edema. Neurologic:  Normal speech and language. No gross focal neurologic deficits are appreciated.  Tremors and tongue fasciculations noted. Skin:  Skin is warm, dry and intact. No rash noted. Psychiatric: Mood and affect are normal. Speech and behavior are normal.  ____________________________________________   LABS (all labs ordered are listed, but only abnormal results are displayed)  Labs Reviewed  CBC WITH DIFFERENTIAL/PLATELET - Abnormal; Notable for the following components:      Result Value   WBC 14.1 (*)    RBC 5.48 (*)    Hemoglobin 9.7 (*)    HCT 32.3 (*)    MCV 58.9 (*)    MCH 17.7 (*)    RDW 21.2 (*)    Neutro Abs 12.0 (*)    Abs Immature Granulocytes 0.09 (*)    All other components within normal limits  COMPREHENSIVE METABOLIC PANEL - Abnormal; Notable for  the following components:   Chloride 94 (*)    CO2 21 (*)    AST 153 (*)    ALT 76 (*)    Anion gap 20 (*)    All other components within normal limits  ETHANOL - Abnormal; Notable for the following components:   Alcohol, Ethyl (B) 228 (*)    All other components within normal limits  RESP PANEL BY RT-PCR (FLU A&B, COVID) ARPGX2  URINE DRUG SCREEN, QUALITATIVE ([facility] ONLY)   ____________________________________________  EKG  ED ECG REPORT I, Blake Divine, the attending physician, personally viewed and interpreted this ECG.   Date: 11/22/2020  EKG Time: 10:39  Rate: 122  Rhythm: sinus tachycardia  Axis: Normal  Intervals:none  ST&T Change: None   PROCEDURES  Procedure(s) performed (including Critical Care):  Procedures   ____________________________________________   INITIAL IMPRESSION / ASSESSMENT AND PLAN / ED COURSE      45 year old female with past medical history of alcohol abuse and seizures who presents to the ED complaining of alcohol withdrawal symptoms with her last drink coming around 3 AM this morning.  She is tachycardic and tremulous, likely due to alcohol withdrawal, and we will give dose of IV Ativan along with initial dose of Librium.  She reports a fall earlier today, does have a history of seizures in the setting of alcohol withdrawal.  We will check CT head and cervical spine given her complaints of pain, also screen EKG and labs, hydrate with IV fluids.  CT head and cervical spine are unremarkable.  EKG shows no evidence of arrhythmia or ischemia.  Labs are remarkable for mild transaminitis likely related to patient's chronic alcohol abuse but no findings to suggest biliary obstruction.  Patient feeling slightly better following IV Ativan and Librium, we will give additional dose of IV Ativan for ongoing alcohol withdrawal.  Given concern for seizures and worsening alcohol withdrawal, case discussed with hospitalist for admission.       ____________________________________________   FINAL CLINICAL IMPRESSION(S) / ED DIAGNOSES  Final diagnoses:  Alcohol withdrawal syndrome with complication (HCC)  Syncope and collapse     ED Discharge Orders     None        Note:  This document was prepared using Dragon voice recognition software and may include unintentional dictation errors.    Blake Divine, MD 11/22/20 [phone]

## 2020-11-23 ENCOUNTER — Inpatient Hospital Stay: Payer: Medicare Other

## 2020-11-23 DIAGNOSIS — Z800 Family history of malignant neoplasm of digestive organs: Secondary | ICD-10-CM | POA: Diagnosis not present

## 2020-11-23 DIAGNOSIS — Z9884 Bariatric surgery status: Secondary | ICD-10-CM | POA: Diagnosis not present

## 2020-11-23 DIAGNOSIS — I517 Cardiomegaly: Secondary | ICD-10-CM | POA: Diagnosis present

## 2020-11-23 DIAGNOSIS — G40909 Epilepsy, unspecified, not intractable, without status epilepticus: Secondary | ICD-10-CM | POA: Diagnosis present

## 2020-11-23 DIAGNOSIS — Z8041 Family history of malignant neoplasm of ovary: Secondary | ICD-10-CM | POA: Diagnosis not present

## 2020-11-23 DIAGNOSIS — Z20822 Contact with and (suspected) exposure to covid-19: Secondary | ICD-10-CM | POA: Diagnosis present

## 2020-11-23 DIAGNOSIS — K921 Melena: Secondary | ICD-10-CM

## 2020-11-23 DIAGNOSIS — R748 Abnormal levels of other serum enzymes: Secondary | ICD-10-CM

## 2020-11-23 DIAGNOSIS — Z820 Family history of epilepsy and other diseases of the nervous system: Secondary | ICD-10-CM | POA: Diagnosis not present

## 2020-11-23 DIAGNOSIS — N888 Other specified noninflammatory disorders of cervix uteri: Secondary | ICD-10-CM | POA: Diagnosis not present

## 2020-11-23 DIAGNOSIS — R519 Headache, unspecified: Secondary | ICD-10-CM | POA: Diagnosis not present

## 2020-11-23 DIAGNOSIS — F10239 Alcohol dependence with withdrawal, unspecified: Secondary | ICD-10-CM | POA: Diagnosis present

## 2020-11-23 DIAGNOSIS — Z8711 Personal history of peptic ulcer disease: Secondary | ICD-10-CM | POA: Diagnosis not present

## 2020-11-23 DIAGNOSIS — K529 Noninfective gastroenteritis and colitis, unspecified: Secondary | ICD-10-CM | POA: Diagnosis present

## 2020-11-23 DIAGNOSIS — F32A Depression, unspecified: Secondary | ICD-10-CM | POA: Diagnosis present

## 2020-11-23 DIAGNOSIS — N83201 Unspecified ovarian cyst, right side: Secondary | ICD-10-CM | POA: Diagnosis present

## 2020-11-23 DIAGNOSIS — Z681 Body mass index (BMI) 19 or less, adult: Secondary | ICD-10-CM | POA: Diagnosis not present

## 2020-11-23 DIAGNOSIS — C539 Malignant neoplasm of cervix uteri, unspecified: Secondary | ICD-10-CM | POA: Diagnosis present

## 2020-11-23 DIAGNOSIS — E43 Unspecified severe protein-calorie malnutrition: Secondary | ICD-10-CM | POA: Diagnosis present

## 2020-11-23 DIAGNOSIS — Z8249 Family history of ischemic heart disease and other diseases of the circulatory system: Secondary | ICD-10-CM | POA: Diagnosis not present

## 2020-11-23 DIAGNOSIS — Z808 Family history of malignant neoplasm of other organs or systems: Secondary | ICD-10-CM | POA: Diagnosis not present

## 2020-11-23 DIAGNOSIS — K922 Gastrointestinal hemorrhage, unspecified: Secondary | ICD-10-CM | POA: Diagnosis present

## 2020-11-23 DIAGNOSIS — N939 Abnormal uterine and vaginal bleeding, unspecified: Secondary | ICD-10-CM | POA: Diagnosis present

## 2020-11-23 DIAGNOSIS — E876 Hypokalemia: Secondary | ICD-10-CM | POA: Diagnosis present

## 2020-11-23 DIAGNOSIS — D649 Anemia, unspecified: Secondary | ICD-10-CM

## 2020-11-23 DIAGNOSIS — E538 Deficiency of other specified B group vitamins: Secondary | ICD-10-CM | POA: Diagnosis present

## 2020-11-23 DIAGNOSIS — D62 Acute posthemorrhagic anemia: Secondary | ICD-10-CM | POA: Diagnosis present

## 2020-11-23 DIAGNOSIS — Y907 Blood alcohol level of 200-239 mg/100 ml: Secondary | ICD-10-CM | POA: Diagnosis present

## 2020-11-23 DIAGNOSIS — Z79899 Other long term (current) drug therapy: Secondary | ICD-10-CM | POA: Diagnosis not present

## 2020-11-23 DIAGNOSIS — E559 Vitamin D deficiency, unspecified: Secondary | ICD-10-CM | POA: Diagnosis present

## 2020-11-23 DIAGNOSIS — D6959 Other secondary thrombocytopenia: Secondary | ICD-10-CM | POA: Diagnosis present

## 2020-11-23 DIAGNOSIS — W19XXXA Unspecified fall, initial encounter: Secondary | ICD-10-CM | POA: Diagnosis present

## 2020-11-23 DIAGNOSIS — R569 Unspecified convulsions: Secondary | ICD-10-CM

## 2020-11-23 DIAGNOSIS — R710 Precipitous drop in hematocrit: Secondary | ICD-10-CM

## 2020-11-23 LAB — URINE DRUG SCREEN, QUALITATIVE (ARMC ONLY)
Amphetamines, Ur Screen: NOT DETECTED
Barbiturates, Ur Screen: NOT DETECTED
Benzodiazepine, Ur Scrn: POSITIVE — AB
Cannabinoid 50 Ng, Ur ~~LOC~~: NOT DETECTED
Cocaine Metabolite,Ur ~~LOC~~: NOT DETECTED
MDMA (Ecstasy)Ur Screen: NOT DETECTED
Methadone Scn, Ur: NOT DETECTED
Opiate, Ur Screen: NOT DETECTED
Phencyclidine (PCP) Ur S: NOT DETECTED
Tricyclic, Ur Screen: NOT DETECTED

## 2020-11-23 LAB — IRON AND TIBC
Iron: 187 ug/dL — ABNORMAL HIGH (ref 28–170)
Saturation Ratios: 58 % — ABNORMAL HIGH (ref 10.4–31.8)
TIBC: 325 ug/dL (ref 250–450)
UIBC: 138 ug/dL

## 2020-11-23 LAB — CBC
HCT: 18 % — ABNORMAL LOW (ref 36.0–46.0)
Hemoglobin: 5.3 g/dL — ABNORMAL LOW (ref 12.0–15.0)
MCH: 17.9 pg — ABNORMAL LOW (ref 26.0–34.0)
MCHC: 29.4 g/dL — ABNORMAL LOW (ref 30.0–36.0)
MCV: 60.8 fL — ABNORMAL LOW (ref 80.0–100.0)
Platelets: 125 10*3/uL — ABNORMAL LOW (ref 150–400)
RBC: 2.96 MIL/uL — ABNORMAL LOW (ref 3.87–5.11)
RDW: 19.3 % — ABNORMAL HIGH (ref 11.5–15.5)
WBC: 3.7 10*3/uL — ABNORMAL LOW (ref 4.0–10.5)
nRBC: 0 % (ref 0.0–0.2)

## 2020-11-23 LAB — RETIC PANEL
Immature Retic Fract: 27.1 % — ABNORMAL HIGH (ref 2.3–15.9)
RBC.: 3.77 MIL/uL — ABNORMAL LOW (ref 3.87–5.11)
Retic Count, Absolute: 39.6 10*3/uL (ref 19.0–186.0)
Retic Ct Pct: 1.1 % (ref 0.4–3.1)
Reticulocyte Hemoglobin: 25.6 pg — ABNORMAL LOW (ref >27.9)

## 2020-11-23 LAB — FERRITIN: Ferritin: 39 ng/mL (ref 11–307)

## 2020-11-23 LAB — PROCALCITONIN: Procalcitonin: <0.1 ng/mL

## 2020-11-23 LAB — BASIC METABOLIC PANEL
Anion gap: 9 (ref 5–15)
BUN: 11 mg/dL (ref 6–20)
CO2: 28 mmol/L (ref 22–32)
Calcium: 8.4 mg/dL — ABNORMAL LOW (ref 8.9–10.3)
Chloride: 100 mmol/L (ref 98–111)
Creatinine, Ser: 0.58 mg/dL (ref 0.44–1.00)
GFR, Estimated: >60 mL/min (ref >60)
Glucose, Bld: 87 mg/dL (ref 70–99)
Potassium: 3.2 mmol/L — ABNORMAL LOW (ref 3.5–5.1)
Sodium: 137 mmol/L (ref 135–145)

## 2020-11-23 LAB — APTT: aPTT: 33 seconds (ref 24–36)

## 2020-11-23 LAB — PROTIME-INR
INR: 1 (ref 0.8–1.2)
Prothrombin Time: 12.8 seconds (ref 11.4–15.2)

## 2020-11-23 LAB — HEMOGLOBIN AND HEMATOCRIT, BLOOD
HCT: 36 % (ref 36.0–46.0)
Hemoglobin: 10.7 g/dL — ABNORMAL LOW (ref 12.0–15.0)

## 2020-11-23 LAB — PREGNANCY, URINE: Preg Test, Ur: NEGATIVE

## 2020-11-23 LAB — PREPARE RBC (CROSSMATCH)

## 2020-11-23 LAB — ABO/RH: ABO/RH(D): O POS

## 2020-11-23 MED ORDER — HALOPERIDOL LACTATE 5 MG/ML IJ SOLN
1.0000 mg | Freq: Once | INTRAMUSCULAR | Status: AC
  Administered 2020-11-23: 1 mg via INTRAVENOUS
  Filled 2020-11-23: qty 1

## 2020-11-23 MED ORDER — MORPHINE SULFATE (PF) 2 MG/ML IV SOLN
1.0000 mg | Freq: Once | INTRAVENOUS | Status: AC
  Administered 2020-11-23: 1 mg via INTRAVENOUS
  Filled 2020-11-23: qty 1

## 2020-11-23 MED ORDER — SODIUM CHLORIDE 0.9% IV SOLUTION
Freq: Once | INTRAVENOUS | Status: AC
  Filled 2020-11-23: qty 250

## 2020-11-23 MED ORDER — PANTOPRAZOLE SODIUM 40 MG IV SOLR
40.0000 mg | Freq: Two times a day (BID) | INTRAVENOUS | Status: DC
  Administered 2020-11-23 – 2020-11-24 (×3): 40 mg via INTRAVENOUS
  Filled 2020-11-23 (×3): qty 40

## 2020-11-23 MED ORDER — FUROSEMIDE 10 MG/ML IJ SOLN
20.0000 mg | Freq: Once | INTRAMUSCULAR | Status: AC
  Administered 2020-11-23: 20 mg via INTRAVENOUS
  Filled 2020-11-23: qty 4

## 2020-11-23 MED ORDER — POTASSIUM CHLORIDE CRYS ER 20 MEQ PO TBCR
40.0000 meq | EXTENDED_RELEASE_TABLET | Freq: Once | ORAL | Status: AC
  Administered 2020-11-23: 40 meq via ORAL
  Filled 2020-11-23: qty 2

## 2020-11-23 MED ORDER — ACETAMINOPHEN 325 MG PO TABS
650.0000 mg | ORAL_TABLET | Freq: Once | ORAL | Status: AC
  Administered 2020-11-23: 650 mg via ORAL
  Filled 2020-11-23: qty 2

## 2020-11-23 MED ORDER — LOPERAMIDE HCL 2 MG PO CAPS
4.0000 mg | ORAL_CAPSULE | Freq: Once | ORAL | Status: AC
  Administered 2020-11-23: 4 mg via ORAL
  Filled 2020-11-23: qty 2

## 2020-11-23 MED ORDER — MORPHINE SULFATE (PF) 2 MG/ML IV SOLN
1.0000 mg | INTRAVENOUS | Status: AC | PRN
  Administered 2020-11-24 (×3): 1 mg via INTRAVENOUS
  Filled 2020-11-23 (×4): qty 1

## 2020-11-23 NOTE — Consult Note (Addendum)
Reason for Consult:Vaginal bleeding Referring Physician: [name] is an 45 y.o. female.  HPI: Patient presented yesterday to the emergency room with complaints of alcohol withdrawal.  She was admitted for alcohol withdrawal and subsequently had a large drop in her hemoglobin.  GI and got gynecology were consulted.  The patient has a planned EGD tomorrow to evaluate for gastric ulcers.  She was noted by nursing to have vaginal bleeding with wiping.  The patient reports that she has had almost daily vaginal bleeding for the last year and a half.  She notes that she will sometimes pass clots of blood the size of quarters.  She reports that she has significant bleeding with intercourse.  She has general pelvic pain and discomfort and pelvic burning.  She reports that she had an exam a year and a half ago by gynecologist and was told that she has prolapse.  She is fearful that she could have a gynecological cancer and is tearful regarding this.  Is also the patient's birthday tomorrow and she is distressed over being in the hospital on her birthday.  She is also distressed regarding her relapse with alcohol.  She had been sober for 2 years but has begun using alcohol again.  She reports that she was drinking heavily to manage her severe pelvic pain.  She reports a family history of a mother with endometriosis and a grandmother with endometriosis and ovarian cancer.  She does report that she has a history of abnormal Pap smears.  She denies any prior cervical procedures such as a LEEP or cone.  She underwent a transabdominal pelvic ultrasound today.  I had ordered a transvaginal ultrasound however my order was changed to a abdominal ultrasound by the technician.  The ultrasound reading saw a complex right ovarian cyst and transvaginal follow-up was recommended.  I have ordered the transvaginal ultrasound again and hopefully will be repeated tomorrow.  Images are not currently loaded in PACS to  review.  Past Medical History:  Diagnosis Date   Alcohol abuse    Tobacco dependence     History reviewed. No pertinent surgical history.  History reviewed. No pertinent family history. She reports a family history of a mother who had endometriosis and a grandmother with endometriosis and ovarian cancer.  Social History:  reports that she has quit smoking. Her smoking use included cigarettes. She uses smokeless tobacco. She reports current alcohol use. She reports that she does not currently use drugs.  Allergies: No Known Allergies  Medications: I have reviewed the patient's current medications.  Results for orders placed or performed during the hospital encounter of 11/22/20 (from the past 48 hour(s))  CBC with Differential     Status: Abnormal   Collection Time: 11/22/20 10:14 AM  Result Value Ref Range   WBC 14.1 (H) 4.0 - 10.5 K/uL   RBC 5.48 (H) 3.87 - 5.11 MIL/uL   Hemoglobin 9.7 (L) 12.0 - 15.0 g/dL   HCT 32.3 (L) 36.0 - 46.0 %   MCV 58.9 (L) 80.0 - 100.0 fL   MCH 17.7 (L) 26.0 - 34.0 pg   MCHC 30.0 30.0 - 36.0 g/dL   RDW 21.2 (H) 11.5 - 15.5 %   Platelets 398 150 - 400 K/uL   nRBC 0.0 0.0 - 0.2 %   Neutrophils Relative % 84 %   Neutro Abs 12.0 (H) 1.7 - 7.7 K/uL   Lymphocytes Relative 9 %   Lymphs Abs 1.3 0.7 - 4.0 K/uL  Monocytes Relative 5 %   Monocytes Absolute 0.7 0.1 - 1.0 K/uL   Eosinophils Relative 0 %   Eosinophils Absolute 0.0 0.0 - 0.5 K/uL   Basophils Relative 1 %   Basophils Absolute 0.1 0.0 - 0.1 K/uL   WBC Morphology MORPHOLOGY UNREMARKABLE    RBC Morphology MIXED RBC MORPHOLOGY PRESENT    Smear Review Normal platelet morphology    Immature Granulocytes 1 %   Abs Immature Granulocytes 0.09 (H) 0.00 - 0.07 K/uL   Target Cells PRESENT     Comment: Performed at [facility], [address] [place], [place] [address]  Comprehensive metabolic panel     Status: Abnormal   Collection Time: 11/22/20 10:14 AM  Result Value Ref Range    Sodium 135 135 - 145 mmol/L   Potassium 4.1 3.5 - 5.1 mmol/L   Chloride 94 (L) 98 - 111 mmol/L   CO2 21 (L) 22 - 32 mmol/L   Glucose, Bld 99 70 - 99 mg/dL    Comment: Glucose reference range applies only to samples taken after fasting for at least 8 hours.   BUN 7 6 - 20 mg/dL   Creatinine, Ser 0.44 0.44 - 1.00 mg/dL   Calcium 9.0 8.9 - 10.3 mg/dL   Total Protein 7.4 6.5 - 8.1 g/dL   Albumin 4.2 3.5 - 5.0 g/dL   AST 153 (H) 15 - 41 U/L   ALT 76 (H) 0 - 44 U/L   Alkaline Phosphatase 73 38 - 126 U/L   Total Bilirubin 0.9 0.3 - 1.2 mg/dL   GFR, Estimated >60 >60 mL/min    Comment: (NOTE) Calculated using the CKD-EPI Creatinine Equation (2021)    Anion gap 20 (H) 5 - 15    Comment: Performed at [facility], [address] [place], [place] [address]  Ethanol     Status: Abnormal   Collection Time: 11/22/20 10:14 AM  Result Value Ref Range   Alcohol, Ethyl (B) 228 (H) <10 mg/dL    Comment: (NOTE) Lowest detectable limit for serum alcohol is 10 mg/dL.  For medical purposes only. Performed at [facility], [place], [place] [address]   Troponin I (High Sensitivity)     Status: None   Collection Time: 11/22/20 10:14 AM  Result Value Ref Range   Troponin I (High Sensitivity) 11 <18 ng/L    Comment: (NOTE) Elevated high sensitivity troponin I (hsTnI) values and significant  changes across serial measurements may suggest ACS but many other  chronic and acute conditions are known to elevate hsTnI results.  Refer to the "Links" section for chest pain algorithms and additional  guidance. Performed at [facility], [address] [place], [place] [address]   Magnesium     Status: None   Collection Time: 11/22/20 10:14 AM  Result Value Ref Range   Magnesium 2.0 1.7 - 2.4 mg/dL    Comment: Performed at [facility], [address] [place], [place] [address]  Procalcitonin     Status: None   Collection Time: 11/22/20 10:14 AM   Result Value Ref Range   Procalcitonin <0.10 ng/mL    Comment:        Interpretation: PCT (Procalcitonin) <= 0.5 ng/mL: Systemic infection (sepsis) is not likely. Local bacterial infection is possible. (NOTE)       Sepsis PCT Algorithm           Lower Respiratory Tract  Infection PCT Algorithm    ----------------------------     ----------------------------         PCT < 0.25 ng/mL                PCT < 0.10 ng/mL          Strongly encourage             Strongly discourage   discontinuation of antibiotics    initiation of antibiotics    ----------------------------     -----------------------------       PCT 0.25 - 0.50 ng/mL            PCT 0.10 - 0.25 ng/mL               OR       >80% decrease in PCT            Discourage initiation of                                            antibiotics      Encourage discontinuation           of antibiotics    ----------------------------     -----------------------------         PCT >= 0.50 ng/mL              PCT 0.26 - 0.50 ng/mL               AND        <80% decrease in PCT             Encourage initiation of                                             antibiotics       Encourage continuation           of antibiotics    ----------------------------     -----------------------------        PCT >= 0.50 ng/mL                  PCT > 0.50 ng/mL               AND         increase in PCT                  Strongly encourage                                      initiation of antibiotics    Strongly encourage escalation           of antibiotics                                     -----------------------------                                           PCT <= 0.25 ng/mL  OR                                        > 80% decrease in PCT                                      Discontinue / Do not initiate                                              antibiotics  Performed at [facility], [address] [place], [place] [address]   Phosphorus     Status: Abnormal   Collection Time: 11/22/20 10:14 AM  Result Value Ref Range   Phosphorus 5.6 (H) 2.5 - 4.6 mg/dL    Comment: Performed at [facility], [address] [place], [place] [address]  CK     Status: Abnormal   Collection Time: 11/22/20 10:14 AM  Result Value Ref Range   Total CK 306 (H) 38 - 234 U/L    Comment: Performed at [facility], [address] [place], [place] [address]  Resp Panel by RT-PCR (Flu A&B, Covid)     Status: None   Collection Time: 11/22/20  1:24 PM   Specimen: Nasopharyngeal(NP) swabs in vial transport medium  Result Value Ref Range   SARS Coronavirus 2 by RT PCR NEGATIVE NEGATIVE    Comment: (NOTE) SARS-CoV-2 target nucleic acids are NOT DETECTED.  The SARS-CoV-2 RNA is generally detectable in upper respiratory specimens during the acute phase of infection. The lowest concentration of SARS-CoV-2 viral copies this assay can detect is 138 copies/mL. A negative result does not preclude SARS-Cov-2 infection and should not be used as the sole basis for treatment or other patient management decisions. A negative result may occur with  improper specimen collection/handling, submission of specimen other than nasopharyngeal swab, presence of viral mutation(s) within the areas targeted by this assay, and inadequate number of viral copies(<138 copies/mL). A negative result must be combined with clinical observations, patient history, and epidemiological information. The expected result is Negative.  Fact Sheet for Patients:  EntrepreneurPulse.com.au  Fact Sheet for Healthcare Providers:  IncredibleEmployment.be  This test is no t yet approved or cleared by the [place] FDA and  has been authorized for detection and/or diagnosis of SARS-CoV-2 by FDA under an Emergency Use  Authorization (EUA). This EUA will remain  in effect (meaning this test can be used) for the duration of the COVID-19 declaration under Section 564(b)(1) of the Act, 21 U.S.C.section 360bbb-3(b)(1), unless the authorization is terminated  or revoked sooner.       Influenza A by PCR NEGATIVE NEGATIVE   Influenza B by PCR NEGATIVE NEGATIVE    Comment: (NOTE) The Xpert Xpress SARS-CoV-2/FLU/RSV plus assay is intended as an aid in the diagnosis of influenza from Nasopharyngeal swab specimens and should not be used as a sole basis for treatment. Nasal washings and aspirates are unacceptable for Xpert Xpress SARS-CoV-2/FLU/RSV testing.  Fact Sheet for Patients: EntrepreneurPulse.com.au  Fact Sheet for Healthcare Providers: IncredibleEmployment.be  This test is not yet approved or cleared by the [place] FDA and has been authorized for detection and/or diagnosis of SARS-CoV-2 by FDA under  an Emergency Use Authorization (EUA). This EUA will remain in effect (meaning this test can be used) for the duration of the COVID-19 declaration under Section 564(b)(1) of the Act, 21 U.S.C. section 360bbb-3(b)(1), unless the authorization is terminated or revoked.  Performed at [facility], [address] [place], [place] [address]   HIV Antibody (routine testing w rflx)     Status: None   Collection Time: 11/22/20  1:24 PM  Result Value Ref Range   HIV Screen 4th Generation wRfx Non Reactive Non Reactive    Comment: Performed at [facility], [place] [address], [place], [place] [address]  Vitamin B12     Status: None   Collection Time: 11/22/20  1:24 PM  Result Value Ref Range   Vitamin B-12 262 180 - 914 pg/mL    Comment: (NOTE) This assay is not validated for testing neonatal or myeloproliferative syndrome specimens for Vitamin B12 levels. Performed at [facility], [place] [address] [place], [place] [address]   Troponin I  (High Sensitivity)     Status: None   Collection Time: 11/22/20  5:55 PM  Result Value Ref Range   Troponin I (High Sensitivity) 9 <18 ng/L    Comment: (NOTE) Elevated high sensitivity troponin I (hsTnI) values and significant  changes across serial measurements may suggest ACS but many other  chronic and acute conditions are known to elevate hsTnI results.  Refer to the "Links" section for chest pain algorithms and additional  guidance. Performed at [facility], [address] [place], [place] [address]   Basic metabolic panel     Status: Abnormal   Collection Time: 11/23/20  6:24 AM  Result Value Ref Range   Sodium 137 135 - 145 mmol/L   Potassium 3.2 (L) 3.5 - 5.1 mmol/L   Chloride 100 98 - 111 mmol/L   CO2 28 22 - 32 mmol/L   Glucose, Bld 87 70 - 99 mg/dL    Comment: Glucose reference range applies only to samples taken after fasting for at least 8 hours.   BUN 11 6 - 20 mg/dL   Creatinine, Ser 0.58 0.44 - 1.00 mg/dL   Calcium 8.4 (L) 8.9 - 10.3 mg/dL   GFR, Estimated >60 >60 mL/min    Comment: (NOTE) Calculated using the CKD-EPI Creatinine Equation (2021)    Anion gap 9 5 - 15    Comment: Performed at [facility], [address] [place], [place] [address]  Procalcitonin     Status: None   Collection Time: 11/23/20  6:24 AM  Result Value Ref Range   Procalcitonin <0.10 ng/mL    Comment:        Interpretation: PCT (Procalcitonin) <= 0.5 ng/mL: Systemic infection (sepsis) is not likely. Local bacterial infection is possible. (NOTE)       Sepsis PCT Algorithm           Lower Respiratory Tract                                      Infection PCT Algorithm    ----------------------------     ----------------------------         PCT < 0.25 ng/mL                PCT < 0.10 ng/mL          Strongly encourage  Strongly discourage   discontinuation of antibiotics    initiation of antibiotics    ----------------------------      -----------------------------       PCT 0.25 - 0.50 ng/mL            PCT 0.10 - 0.25 ng/mL               OR       >80% decrease in PCT            Discourage initiation of                                            antibiotics      Encourage discontinuation           of antibiotics    ----------------------------     -----------------------------         PCT >= 0.50 ng/mL              PCT 0.26 - 0.50 ng/mL               AND        <80% decrease in PCT             Encourage initiation of                                             antibiotics       Encourage continuation           of antibiotics    ----------------------------     -----------------------------        PCT >= 0.50 ng/mL                  PCT > 0.50 ng/mL               AND         increase in PCT                  Strongly encourage                                      initiation of antibiotics    Strongly encourage escalation           of antibiotics                                     -----------------------------                                           PCT <= 0.25 ng/mL                                                 OR                                        >  80% decrease in PCT                                      Discontinue / Do not initiate                                             antibiotics  Performed at [facility], [address] [place], [place] [address]   Protime-INR     Status: None   Collection Time: 11/23/20  6:24 AM  Result Value Ref Range   Prothrombin Time 12.8 11.4 - 15.2 seconds   INR 1.0 0.8 - 1.2    Comment: (NOTE) INR goal varies based on device and disease states. Performed at [facility], [address] [place], [place] [address]   APTT     Status: None   Collection Time: 11/23/20  6:24 AM  Result Value Ref Range   aPTT 33 24 - 36 seconds    Comment: Performed at [facility], [address] [place], [place] [address]  Retic Panel     Status:  Abnormal   Collection Time: 11/23/20  6:24 AM  Result Value Ref Range   Retic Ct Pct 1.1 0.4 - 3.1 %   RBC. 3.77 (L) 3.87 - 5.11 MIL/uL   Retic Count, Absolute 39.6 19.0 - 186.0 K/uL   Immature Retic Fract 27.1 (H) 2.3 - 15.9 %   Reticulocyte Hemoglobin 25.6 (L) >27.9 pg    Comment:        A RET-He < 28 pg is an indication of iron-deficient or iron- insufficient erythropoiesis. Patients with thalassemia may also have a decreased RET-He result unrelated to iron availability.     If this patient has chronic kidney disease and does not have a hemoglobinopathy he/she meets criteria for iron deficiency per the 2016 NICE guidelines. Refer to specific guidelines to determine the appropriate thresholds for treating CKD- associated iron deficiency. TSAT and ferritin should be used in patients with hemoglobinopathies (e.g. thalassemia). Performed at [facility], [address] [place], [place] [address]   ABO/Rh     Status: None   Collection Time: 11/23/20  7:45 AM  Result Value Ref Range   ABO/RH(D)      O POS Performed at [facility], [address] [place], [place] [address]   CBC     Status: Abnormal   Collection Time: 11/23/20  7:46 AM  Result Value Ref Range   WBC 3.7 (L) 4.0 - 10.5 K/uL   RBC 2.96 (L) 3.87 - 5.11 MIL/uL   Hemoglobin 5.3 (L) 12.0 - 15.0 g/dL    Comment: REPEATED TO VERIFY Reticulocyte Hemoglobin testing may be clinically indicated, consider ordering this additional test [id number]    HCT 18.0 (L) 36.0 - 46.0 %   MCV 60.8 (L) 80.0 - 100.0 fL   MCH 17.9 (L) 26.0 - 34.0 pg   MCHC 29.4 (L) 30.0 - 36.0 g/dL   RDW 19.3 (H) 11.5 - 15.5 %   Platelets 125 (L) 150 - 400 K/uL    Comment: Immature Platelet Fraction may be clinically indicated, consider ordering this additional test [id number]    nRBC 0.0 0.0 - 0.2 %    Comment: Performed at [facility], [address] [place], [place]  [address]  Type and screen [place]     Status: None (Preliminary result)   Collection Time: 11/23/20  8:06 AM  Result Value Ref Range   ABO/RH(D) O POS    Antibody Screen NEG    Sample Expiration 11/26/2020,2359    Unit Number [id number]    Blood Component Type RED CELLS,LR    Unit division 00    Status of Unit ISSUED    Transfusion Status OK TO TRANSFUSE    Crossmatch Result      Compatible Performed at [facility], [address], [place], [place] [address]    Unit Number [id number]    Blood Component Type RED CELLS,LR    Unit division 00    Status of Unit ISSUED    Transfusion Status OK TO TRANSFUSE    Crossmatch Result Compatible   Ferritin     Status: None   Collection Time: 11/23/20  9:21 AM  Result Value Ref Range   Ferritin 39 11 - 307 ng/mL    Comment: Performed at [facility], [address] [place], [place] [address]  Iron and TIBC     Status: Abnormal   Collection Time: 11/23/20  9:21 AM  Result Value Ref Range   Iron 187 (H) 28 - 170 ug/dL   TIBC 325 250 - 450 ug/dL   Saturation Ratios 58 (H) 10.4 - 31.8 %   UIBC 138 ug/dL    Comment: Performed at [facility], [address] [place], [place] [address]  Prepare RBC (crossmatch)     Status: None   Collection Time: 11/23/20  9:30 AM  Result Value Ref Range   Order Confirmation      ORDER PROCESSED BY BLOOD BANK Performed at [facility], [address] [place], [place] [address]   Urine Drug Screen, Qualitative     Status: Abnormal   Collection Time: 11/23/20  3:50 PM  Result Value Ref Range   Tricyclic, Ur Screen NONE DETECTED NONE DETECTED   Amphetamines, Ur Screen NONE DETECTED NONE DETECTED   MDMA (Ecstasy)Ur Screen NONE DETECTED NONE DETECTED   Cocaine Metabolite,Ur [place] NONE DETECTED NONE DETECTED   Opiate, Ur Screen NONE DETECTED NONE DETECTED   Phencyclidine (PCP) Ur S NONE DETECTED NONE DETECTED   Cannabinoid 50 Ng, Ur [place] NONE DETECTED NONE DETECTED    Barbiturates, Ur Screen NONE DETECTED NONE DETECTED   Benzodiazepine, Ur Scrn POSITIVE (A) NONE DETECTED   Methadone Scn, Ur NONE DETECTED NONE DETECTED    Comment: (NOTE) Tricyclics + metabolites, urine    Cutoff 1000 ng/mL Amphetamines + metabolites, urine  Cutoff 1000 ng/mL MDMA (Ecstasy), urine              Cutoff 500 ng/mL Cocaine Metabolite, urine          Cutoff 300 ng/mL Opiate + metabolites, urine        Cutoff 300 ng/mL Phencyclidine (PCP), urine         Cutoff 25 ng/mL Cannabinoid, urine                 Cutoff 50 ng/mL Barbiturates + metabolites, urine  Cutoff 200 ng/mL Benzodiazepine, urine              Cutoff 200 ng/mL Methadone, urine                   Cutoff 300 ng/mL  The urine drug screen provides only a preliminary, unconfirmed analytical test result  and should not be used for non-medical purposes. Clinical consideration and professional judgment should be applied to any positive drug screen result due to possible interfering substances. A more specific alternate chemical method must be used in order to obtain a confirmed analytical result. Gas chromatography / mass spectrometry (GC/MS) is the preferred confirm atory method. Performed at [facility], [address] [place], [place] [address]   Pregnancy, urine     Status: None   Collection Time: 11/23/20  3:50 PM  Result Value Ref Range   Preg Test, Ur NEGATIVE NEGATIVE    Comment: Performed at [facility], [address] [place], [place] [address]  Hemoglobin and hematocrit, blood     Status: Abnormal   Collection Time: 11/23/20  6:47 PM  Result Value Ref Range   Hemoglobin 10.7 (L) 12.0 - 15.0 g/dL    Comment: REPEATED TO VERIFY   HCT 36.0 36.0 - 46.0 %    Comment: Performed at [facility], [address], [place],  [address]    DG Chest 2 View  Result Date: 11/22/2020 CLINICAL DATA:  Decreased consciousness EXAM: CHEST - 2 VIEW COMPARISON:  10/22/2015 from  [facility] radiology. FINDINGS: Cholecystectomy clips. Midline trachea. Normal heart size and mediastinal contours. No pleural effusion or pneumothorax. Mediastinal surgical clips. Clear lungs. IMPRESSION: No acute cardiopulmonary disease. Electronically Signed   By: Abigail Miyamoto M.D.   On: 11/22/2020 13:33   CT Head Wo Contrast  Result Date: 11/22/2020 CLINICAL DATA:  Head trauma, abnormal mental status (Age 48-64y); Neck trauma, intoxicated or obtunded (Age >= 16y) EXAM: CT HEAD WITHOUT CONTRAST CT CERVICAL SPINE WITHOUT CONTRAST TECHNIQUE: Multidetector CT imaging of the head and cervical spine was performed following the standard protocol without intravenous contrast. Multiplanar CT image reconstructions of the cervical spine were also generated. COMPARISON:  None. FINDINGS: CT HEAD FINDINGS Brain: No evidence of acute large vascular territory infarction, hemorrhage, hydrocephalus, extra-axial collection or mass lesion/mass effect. Encephalomalacia in the left frontal lobe, extending to the left anterior periventricular white matter. Vascular: No hyperdense vessel identified. Skull: Left frontal and right occipital burr holes. No acute fracture. Sinuses/Orbits: Visualized sinuses are clear. Visualized orbits are unremarkable. Other: No mastoid effusions. CT CERVICAL SPINE FINDINGS Alignment: No substantial sagittal subluxation. Mild broad levocurvature. Skull base and vertebrae: No evidence of acute fracture. Vertebral body heights are maintained. Soft tissues and spinal canal: No prevertebral fluid or swelling. No visible canal hematoma. Approximately 8 mm right thyroid nodule, which is not require further imaging follow-up (ref: J Am Coll Radiol. 2015 Feb;12(2): 143-50). Disc levels: Mild-to-moderate degenerative disease on the left at C6-C7 where there is endplate sclerosis, disc height loss and posterior endplate spurring. Upper chest: Visualized lung apices are clear. IMPRESSION: CT head: 1. No evidence of  acute intracranial abnormality. 2. Left frontal lobe encephalomalacia. CT cervical spine: 1. No evidence of acute fracture or traumatic malalignment. 2. Mild-to-moderate C6-C7 degenerative change. Electronically Signed   By: Margaretha Sheffield M.D.   On: 11/22/2020 11:39   CT Cervical Spine Wo Contrast  Result Date: 11/22/2020 CLINICAL DATA:  Head trauma, abnormal mental status (Age 38-64y); Neck trauma, intoxicated or obtunded (Age >= 16y) EXAM: CT HEAD WITHOUT CONTRAST CT CERVICAL SPINE WITHOUT CONTRAST TECHNIQUE: Multidetector CT imaging of the head and cervical spine was performed following the standard protocol without intravenous contrast. Multiplanar CT image reconstructions of the cervical spine were also generated. COMPARISON:  None. FINDINGS: CT HEAD FINDINGS Brain: No evidence of acute large  vascular territory infarction, hemorrhage, hydrocephalus, extra-axial collection or mass lesion/mass effect. Encephalomalacia in the left frontal lobe, extending to the left anterior periventricular white matter. Vascular: No hyperdense vessel identified. Skull: Left frontal and right occipital burr holes. No acute fracture. Sinuses/Orbits: Visualized sinuses are clear. Visualized orbits are unremarkable. Other: No mastoid effusions. CT CERVICAL SPINE FINDINGS Alignment: No substantial sagittal subluxation. Mild broad levocurvature. Skull base and vertebrae: No evidence of acute fracture. Vertebral body heights are maintained. Soft tissues and spinal canal: No prevertebral fluid or swelling. No visible canal hematoma. Approximately 8 mm right thyroid nodule, which is not require further imaging follow-up (ref: J Am Coll Radiol. 2015 Feb;12(2): 143-50). Disc levels: Mild-to-moderate degenerative disease on the left at C6-C7 where there is endplate sclerosis, disc height loss and posterior endplate spurring. Upper chest: Visualized lung apices are clear. IMPRESSION: CT head: 1. No evidence of acute intracranial  abnormality. 2. Left frontal lobe encephalomalacia. CT cervical spine: 1. No evidence of acute fracture or traumatic malalignment. 2. Mild-to-moderate C6-C7 degenerative change. Electronically Signed   By: Margaretha Sheffield M.D.   On: 11/22/2020 11:39   US PELVIS (TRANSABDOMINAL ONLY)  Result Date: 11/23/2020 CLINICAL DATA:  Intermittent irregular vaginal bleeding for 3 months EXAM: TRANSABDOMINAL ULTRASOUND OF PELVIS TECHNIQUE: Transabdominal ultrasound examination of the pelvis was performed including evaluation of the uterus, ovaries, adnexal regions, and pelvic cul-de-sac. Transvaginal imaging was not ordered. Transabdominal exam was terminated prematurely by patient due to needing to void, with incomplete adnexal assessment. COMPARISON:  None FINDINGS: Uterus Measurements: 9.0 x 5.0 x 5.7 cm = volume: 134 mL. Retroverted. Normal morphology without mass Endometrium Thickness: 9 mm. Small amount of nonspecific endometrial fluid. No discrete endometrial mass. Right ovary Measurements: 5.8 x 3.4 x 3.1 cm = volume: 32 mL. Complex hypoechoic nodule 3.0 cm diameter, question hemorrhagic cyst. Additional complex hypoechoic nodule with internal echogenicity, potentially small corpus luteum. Left ovary Measurements: 3.5 x 2.3 x 1.6 cm = volume: 6.8 mL. Normal morphology without mass Other findings: No free pelvic fluid. No adnexal masses grossly identified. IMPRESSION: Small amount of nonspecific endometrial fluid. Complex hypoechoic nodule RIGHT ovary 3.0 cm diameter, question hemorrhagic cyst, suboptimally assessed; either transvaginal ultrasound characterization now or follow-up ultrasound in 6-12 weeks recommended. Electronically Signed   By: Lavonia Dana M.D.   On: 11/23/2020 18:38    Review of Systems Blood pressure (!) 143/94, pulse 97, temperature (!) 97.5 F (36.4 C), temperature source Oral, resp. rate 20, height 5' 7" (1.702 m), weight 54.4 kg, SpO2 100 %. Physical Exam Vitals and nursing note  reviewed.  Constitutional:      Appearance: Normal appearance. She is well-developed.  HENT:     Head: Normocephalic and atraumatic.  Cardiovascular:     Rate and Rhythm: Normal rate and regular rhythm.  Pulmonary:     Effort: Pulmonary effort is normal.     Breath sounds: Normal breath sounds.  Abdominal:     General: Bowel sounds are normal.     Palpations: Abdomen is soft.  Genitourinary:    Comments: External: Normal appearing vulva. No lesions noted.  Speculum examination: Enlarged anterior cervix.  No blood in the vaginal vault.   Bimanual examination: Limited with abdominal tension. Cervix is firm and enlarged.  Musculoskeletal:        General: Normal range of motion.  Skin:    General: Skin is warm and dry.  Neurological:     Mental Status: She is alert and oriented to person, place, and time.  Psychiatric:        Behavior: Behavior normal.        Thought Content: Thought content normal.        Judgment: Judgment normal.    Assessment/Plan: 45 yo with Abnormal uterine bleeding Pelvic exam today limited, but no visual cervical abnormality was noted. Cervix did feel firm and enlarged.  Not able to access uterus and ovaries on bimanual exam because of patient discomfort.  With review of care everywhere the patient had a normal NIL Pap smear on 06/05/2020.  Patient's vaginal bleeding is minimal  does not currently warrant treatment with p.o. progestin or estrogen for acute heavy bleeding.  Can plan for interval follow-up with the patient outpatient for continued evaluation of source of abnormal uterine bleeding. Complex right ovarian cyst- transvaginal US pending. Will consider CA125. Will likely need outpatient follow up imaging in 6-12 weeks.   Vanessa Romero R Keena Dinse 11/23/2020, 7:57 PM

## 2020-11-23 NOTE — ED Notes (Signed)
Pt is complaining of feeling anxious. And asked for her PRN atarax.

## 2020-11-23 NOTE — ED Notes (Signed)
Pt ambulated to toilet and back to bed. 

## 2020-11-23 NOTE — ED Notes (Signed)
Pt is very "sleepy" and hard to arouse at times. Pt has large purse in bed with her and several vape pens. Pt started to use her vape pen and this  RN advised her I would rather her not vape while in the hospital.

## 2020-11-23 NOTE — ED Notes (Signed)
MD messaged and made aware of pt level of responsiveness and belongings at bedside.

## 2020-11-23 NOTE — ED Notes (Signed)
MD Hal Hope) updated on the patients continued need for large doses of Ativan - Pt remains alert - VSS- View MAR for intervention.

## 2020-11-23 NOTE — Consult Note (Addendum)
Vanessa Antigua, MD [address], [place], [place], [place], [id number] [place], [place], [place], [address] Phone: [phone]  Fax: [phone]  Consultation  Referring Provider:     Dr. Leslye Peer Primary Care Physician:  Anselmo Pickler, MD Reason for Consultation:     Anemia  Date of Admission:  11/22/2020 Date of Consultation:  11/23/2020         HPI:   Vanessa Romero is a 45 y.o. female admitted for alcohol withdrawal, with GI being consulted for acute anemia.  Patient and nurse denied noting any sources of bleeding since being in the hospital.  Interestingly patient's hemoglobin yesterday was 9.7 and has acutely dropped to 5.3 today without any evidence of active bleeding.  However, patient's white cell count was 14.1 yesterday and is 3.7 today, platelets were 398 yesterday and 125 today.  As per hospitalist note, patient did receive aggressive IV hydration on admission.  Patient reports history of previous GI ulcers.  States she may have noted black stool a month ago.  Describes daily NSAID use as well.  Describes having a previous upper endoscopy.  Care everywhere GI notes reviewed and patient had seen Dr. Merrilee Jansky of [facility] on June 16, 2020 and patient has a history of Roux-en-Y gastric bypass in 2011.  This note states that patient had a colonoscopy 2 years ago by Dr. Lorane Gell in 2020.  Procedure report not available.  Due to abdominal pain patient was scheduled for upper endoscopy which was done on June 22.  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  At least 4 erosions at the anastomosis.  Visible staple at the anastomosis.  Small bowel reported to be normal.  Protonix, sucralfate and Questran was recommended.  Colonoscopy was recommended but has not been done yet.  Patient has also previously seen hematology for iron deficiency anemia.  Past Medical History:  Diagnosis Date   Alcohol abuse    Tobacco dependence      Past surgical history: Roux-en-Y gastric bypass  Prior to Admission medications   Medication Sig Start Date End Date Taking? Authorizing Provider  escitalopram (LEXAPRO) 20 MG tablet Take 1 tablet by mouth daily at 12 noon. 05/28/19  Yes [provider]  hydrOXYzine (ATARAX/VISTARIL) 10 MG tablet Take 1 tablet by mouth 3 (three) times daily as needed. 11/17/20 11/27/20 Yes [provider]  lamoTRIgine (LAMICTAL) 25 MG tablet Take 2 tablets by mouth daily. 04/05/20  Yes [provider]    Family history: Basal cell carcinoma in father, colon cancer mother, colon polyps brother and mother.  Diabetes mother.  Heart failure father.  High blood pressure Father.  Multiple sclerosis mother.  Social History   Tobacco Use   Smoking status: Former    Types: Cigarettes   Smokeless tobacco: Current  Substance Use Topics   Alcohol use: Yes   Drug use: Not Currently    Allergies as of 11/22/2020   (No Known Allergies)    Review of Systems:    All systems reviewed and negative except where noted in HPI.   Physical Exam:  Constitutional: General:   Alert,  Well-developed, well-nourished, pleasant and cooperative in NAD BP 120/72   Pulse 80   Temp 98.6 F (37 C) (Oral)   Resp 16   Ht 5\' 7"  (1.702 m)   Wt 54.4 kg   LMP  (Exact Date)   SpO2 98%   BMI 18.79 kg/m   Eyes:  Sclera clear, no icterus.   Conjunctiva pink. PERRLA  Ears:  No scars, lesions or masses, Normal auditory acuity. Nose:  No deformity, discharge, or lesions. Mouth:  No deformity or lesions, oropharynx pink & moist.  Neck:  Supple; no masses or thyromegaly.  Respiratory: Normal respiratory effort, Normal percussion  Gastrointestinal:  Normal bowel sounds.  No bruits.  Soft, non-tender and non-distended without masses, hepatosplenomegaly or hernias noted.  No guarding or rebound tenderness.     Cardiac: No clubbing or edema.  No cyanosis. Normal posterior tibial pedal pulses  noted.  Lymphatic:  No significant cervical or axillary adenopathy.  Psych:  Alert and cooperative. Normal mood and affect.  Musculoskeletal:  Normal gait. Head normocephalic, atraumatic. Symmetrical without gross deformities. 5/5 Upper and Lower extremity strength bilaterally.  Skin: Warm. Intact without significant lesions or rashes. No jaundice.  Neurologic:  Face symmetrical, tongue midline, Normal sensation to touch;  grossly normal neurologically.  Psych:  Alert and oriented x3, Alert and cooperative. Normal mood and affect.   LAB RESULTS: Recent Labs    11/21/20 1509 11/22/20 1014 11/23/20 0746  WBC 13.2* 14.1* 3.7*  HGB 10.1* 9.7* 5.3*  HCT 34.8* 32.3* 18.0*  PLT 418* 398 125*   BMET Recent Labs    11/22/20 1014 11/23/20 0624  NA 135 137  K 4.1 3.2*  CL 94* 100  CO2 21* 28  GLUCOSE 99 87  BUN 7 11  CREATININE 0.44 0.58  CALCIUM 9.0 8.4*   LFT Recent Labs    11/22/20 1014  PROT 7.4  ALBUMIN 4.2  AST 153*  ALT 76*  ALKPHOS 73  BILITOT 0.9   PT/INR Recent Labs    11/23/20 0624  LABPROT 12.8  INR 1.0    STUDIES: DG Chest 2 View  Result Date: 11/22/2020 CLINICAL DATA:  Decreased consciousness EXAM: CHEST - 2 VIEW COMPARISON:  10/22/2015 from [facility] radiology. FINDINGS: Cholecystectomy clips. Midline trachea. Normal heart size and mediastinal contours. No pleural effusion or pneumothorax. Mediastinal surgical clips. Clear lungs. IMPRESSION: No acute cardiopulmonary disease. Electronically Signed   By: Abigail Miyamoto M.D.   On: 11/22/2020 13:33   CT Head Wo Contrast  Result Date: 11/22/2020 CLINICAL DATA:  Head trauma, abnormal mental status (Age 52-64y); Neck trauma, intoxicated or obtunded (Age >= 16y) EXAM: CT HEAD WITHOUT CONTRAST CT CERVICAL SPINE WITHOUT CONTRAST TECHNIQUE: Multidetector CT imaging of the head and cervical spine was performed following the standard protocol without intravenous contrast. Multiplanar CT image reconstructions of  the cervical spine were also generated. COMPARISON:  None. FINDINGS: CT HEAD FINDINGS Brain: No evidence of acute large vascular territory infarction, hemorrhage, hydrocephalus, extra-axial collection or mass lesion/mass effect. Encephalomalacia in the left frontal lobe, extending to the left anterior periventricular white matter. Vascular: No hyperdense vessel identified. Skull: Left frontal and right occipital burr holes. No acute fracture. Sinuses/Orbits: Visualized sinuses are clear. Visualized orbits are unremarkable. Other: No mastoid effusions. CT CERVICAL SPINE FINDINGS Alignment: No substantial sagittal subluxation. Mild broad levocurvature. Skull base and vertebrae: No evidence of acute fracture. Vertebral body heights are maintained. Soft tissues and spinal canal: No prevertebral fluid or swelling. No visible canal hematoma. Approximately 8 mm right thyroid nodule, which is not require further imaging follow-up (ref: J Am Coll Radiol. 2015 Feb;12(2): 143-50). Disc levels: Mild-to-moderate degenerative disease on the left at C6-C7 where there is endplate sclerosis, disc height loss and posterior endplate spurring. Upper chest: Visualized lung apices are clear. IMPRESSION: CT head: 1. No evidence of acute  intracranial abnormality. 2. Left frontal lobe encephalomalacia. CT cervical spine: 1. No evidence of acute fracture or traumatic malalignment. 2. Mild-to-moderate C6-C7 degenerative change. Electronically Signed   By: Margaretha Sheffield M.D.   On: 11/22/2020 11:39   CT Cervical Spine Wo Contrast  Result Date: 11/22/2020 CLINICAL DATA:  Head trauma, abnormal mental status (Age 61-64y); Neck trauma, intoxicated or obtunded (Age >= 16y) EXAM: CT HEAD WITHOUT CONTRAST CT CERVICAL SPINE WITHOUT CONTRAST TECHNIQUE: Multidetector CT imaging of the head and cervical spine was performed following the standard protocol without intravenous contrast. Multiplanar CT image reconstructions of the cervical spine were  also generated. COMPARISON:  None. FINDINGS: CT HEAD FINDINGS Brain: No evidence of acute large vascular territory infarction, hemorrhage, hydrocephalus, extra-axial collection or mass lesion/mass effect. Encephalomalacia in the left frontal lobe, extending to the left anterior periventricular white matter. Vascular: No hyperdense vessel identified. Skull: Left frontal and right occipital burr holes. No acute fracture. Sinuses/Orbits: Visualized sinuses are clear. Visualized orbits are unremarkable. Other: No mastoid effusions. CT CERVICAL SPINE FINDINGS Alignment: No substantial sagittal subluxation. Mild broad levocurvature. Skull base and vertebrae: No evidence of acute fracture. Vertebral body heights are maintained. Soft tissues and spinal canal: No prevertebral fluid or swelling. No visible canal hematoma. Approximately 8 mm right thyroid nodule, which is not require further imaging follow-up (ref: J Am Coll Radiol. 2015 Feb;12(2): 143-50). Disc levels: Mild-to-moderate degenerative disease on the left at C6-C7 where there is endplate sclerosis, disc height loss and posterior endplate spurring. Upper chest: Visualized lung apices are clear. IMPRESSION: CT head: 1. No evidence of acute intracranial abnormality. 2. Left frontal lobe encephalomalacia. CT cervical spine: 1. No evidence of acute fracture or traumatic malalignment. 2. Mild-to-moderate C6-C7 degenerative change. Electronically Signed   By: Margaretha Sheffield M.D.   On: 11/22/2020 11:39      Impression / Plan:   Vanessa Romero is a 45 y.o. y/o female with admission for alcohol withdrawal with GI being consulted for acute anemia  Given that patient was noted to have acute decline in all cell lines including white cell count, hemoglobin, and platelets within 24 hours of admission and receiving aggressive IV hydration, this may be dilutional effect  However, patient is report noting black stool at home a month ago and has been using NSAIDs at  home frequently  She does have history of iron deficiency anemia and gastric erosions  Would recommend upper endoscopy once hemoglobin is above 7, likely tomorrow.  Patient is still receiving blood to correct her hemoglobin of 5.3  Patient will also need close follow-up with primary GI for outpatient colonoscopy as previously planned by them.  However, if EGD does not show any abnormalities and anemia does not improve, colonoscopy can be considered as an inpatient if necessary  PPI IV twice daily  Continue serial CBCs and transfuse PRN Avoid NSAIDs Maintain 2 large-bore IV lines Please page GI with any acute hemodynamic changes, or signs of active GI bleeding. (No indication for emergent EGD at this time given that there are no signs of active GI bleeding, and it is important to correct her very low hemoglobin at this time and medically optimized the patient prior to endoscopy)  Elevated transaminases in greater than 2:1 pattern consistent with patient's alcohol use Avoid hepatotoxic drugs CIWA protocol Folate, thiamine Encourage abstinence  Patient will be followed by one of the [facility] providers over the week who would also be doing her endoscopy tomorrow as I am on vacation.  Thank  you for involving me in the care of this patient.      LOS: 0 days   Virgel Manifold, MD  11/23/2020, 1:01 PM

## 2020-11-23 NOTE — Progress Notes (Signed)
Husband at bedside visiting pt.

## 2020-11-23 NOTE — Progress Notes (Signed)
Patient ID: Vanessa Romero, female   DOB: 1975-03-25, 45 y.o.   MRN: [id number] [facility] PROGRESS NOTE  Betsabe Iglesia WNI:[id number] DOB: 1975/06/14 DOA: 11/22/2020 PCP: Anselmo Pickler, MD  HPI/Subjective: Patient coming in with alcohol withdrawal.  Was given vigorous IV fluids and required quite a bit of IV Ativan yesterday.  I did wake the patient up today and she answered some questions and consented for blood transfusion since her hemoglobin dropped down to 5.3.  Patient denies any bleeding or vomiting blood.  History of gastric bypass and alcohol abuse.  Objective: Vitals:   11/23/20 1021 11/23/20 1045  BP: 130/90 120/72  Pulse: 95 80  Resp: 16 16  Temp: 98.5 F (36.9 C) 98.6 F (37 C)  SpO2: 100% 98%   No intake or output data in the 24 hours ending 11/23/20 1227 Filed Weights   11/22/20 0922  Weight: 54.4 kg    ROS: Review of Systems  Respiratory:  Negative for shortness of breath.   Cardiovascular:  Negative for chest pain.  Gastrointestinal:  Positive for abdominal pain. Negative for nausea and vomiting.  Exam: Physical Exam HENT:     Head: Normocephalic.     Mouth/Throat:     Pharynx: No oropharyngeal exudate.  Eyes:     General: Lids are normal.     Conjunctiva/sclera: Conjunctivae normal.  Cardiovascular:     Rate and Rhythm: Normal rate and regular rhythm.     Heart sounds: Normal heart sounds, S1 normal and S2 normal.  Pulmonary:     Breath sounds: Normal breath sounds. No decreased breath sounds, wheezing, rhonchi or rales.  Abdominal:     Palpations: Abdomen is soft.     Tenderness: There is no abdominal tenderness.  Musculoskeletal:     Right lower leg: No swelling.     Left lower leg: No swelling.  Skin:    General: Skin is warm.     Findings: No rash.  Neurological:     Mental Status: She is lethargic.      Scheduled Meds:  sodium chloride   Intravenous Once   enoxaparin (LOVENOX) injection  40 mg Subcutaneous Q24H    escitalopram  20 mg Oral [id number]   folic acid  1 mg Oral Daily   furosemide  20 mg Intravenous Once   lamoTRIgine  50 mg Oral Daily   lidocaine  1 patch Transdermal Q24H   multivitamin with minerals  1 tablet Oral Daily   pantoprazole (PROTONIX) IV  40 mg Intravenous Q12H   thiamine injection  100 mg Intravenous Daily   Continuous Infusions:  sodium chloride 50 mL/hr at 11/23/20 0810    Assessment/Plan:  Drop in hemoglobin suspected GI bleed.  History of gastric bypass and alcohol abuse.  We will start Protonix IV twice daily.  GI consultation.  Patient consented for 2 units of packed red blood cells on a hemoglobin of 5.3.  Hold IV fluids. Alcohol withdrawal.  Alcohol withdrawal protocol.  Continue thiamine folic acid and multivitamin History of seizure disorder on lamotrigine History of LVH Depression on Lexapro Hypokalemia.  Replace potassium orally     Code Status:     Code Status Orders  (From admission, onward)           Start     Ordered   11/22/20 1303  Full code  Continuous        11/22/20 1306           Code Status History  This patient has a current code status but no historical code status.      Family Communication: Updated mother on the phone Disposition Plan: Status is: Inpatient  Time spent: 29 minutes, case discussed with nursing staff and [place]  [facility]

## 2020-11-23 NOTE — H&P (View-Only) (Signed)
Reason for Consult:Vaginal bleeding Referring Physician: Dr. Wieting  Vanessa Romero is an 44 y.o. female.  HPI: Patient presented yesterday to the emergency room with complaints of alcohol withdrawal.  She was admitted for alcohol withdrawal and subsequently had a large drop in her hemoglobin.  GI and got gynecology were consulted.  The patient has a planned EGD tomorrow to evaluate for gastric ulcers.  She was noted by nursing to have vaginal bleeding with wiping.  The patient reports that she has had almost daily vaginal bleeding for the last year and a half.  She notes that she will sometimes pass clots of blood the size of quarters.  She reports that she has significant bleeding with intercourse.  She has general pelvic pain and discomfort and pelvic burning.  She reports that she had an exam a year and a half ago by gynecologist and was told that she has prolapse.  She is fearful that she could have a gynecological cancer and is tearful regarding this.  Is also the patient's birthday tomorrow and she is distressed over being in the hospital on her birthday.  She is also distressed regarding her relapse with alcohol.  She had been sober for 2 years but has begun using alcohol again.  She reports that she was drinking heavily to manage her severe pelvic pain.  She reports a family history of a mother with endometriosis and a grandmother with endometriosis and ovarian cancer.  She does report that she has a history of abnormal Pap smears.  She denies any prior cervical procedures such as a LEEP or cone.  She underwent a transabdominal pelvic ultrasound today.  I had ordered a transvaginal ultrasound however my order was changed to a abdominal ultrasound by the technician.  The ultrasound reading saw a complex right ovarian cyst and transvaginal follow-up was recommended.  I have ordered the transvaginal ultrasound again and hopefully will be repeated tomorrow.  Images are not currently loaded in PACS to  review.  Past Medical History:  Diagnosis Date   Alcohol abuse    Tobacco dependence     History reviewed. No pertinent surgical history.  History reviewed. No pertinent family history. She reports a family history of a mother who had endometriosis and a grandmother with endometriosis and ovarian cancer.  Social History:  reports that she has quit smoking. Her smoking use included cigarettes. She uses smokeless tobacco. She reports current alcohol use. She reports that she does not currently use drugs.  Allergies: No Known Allergies  Medications: I have reviewed the patient's current medications.  Results for orders placed or performed during the hospital encounter of 11/22/20 (from the past 48 hour(s))  CBC with Differential     Status: Abnormal   Collection Time: 11/22/20 10:14 AM  Result Value Ref Range   WBC 14.1 (H) 4.0 - 10.5 K/uL   RBC 5.48 (H) 3.87 - 5.11 MIL/uL   Hemoglobin 9.7 (L) 12.0 - 15.0 g/dL   HCT 32.3 (L) 36.0 - 46.0 %   MCV 58.9 (L) 80.0 - 100.0 fL   MCH 17.7 (L) 26.0 - 34.0 pg   MCHC 30.0 30.0 - 36.0 g/dL   RDW 21.2 (H) 11.5 - 15.5 %   Platelets 398 150 - 400 K/uL   nRBC 0.0 0.0 - 0.2 %   Neutrophils Relative % 84 %   Neutro Abs 12.0 (H) 1.7 - 7.7 K/uL   Lymphocytes Relative 9 %   Lymphs Abs 1.3 0.7 - 4.0 K/uL     Monocytes Relative 5 %   Monocytes Absolute 0.7 0.1 - 1.0 K/uL   Eosinophils Relative 0 %   Eosinophils Absolute 0.0 0.0 - 0.5 K/uL   Basophils Relative 1 %   Basophils Absolute 0.1 0.0 - 0.1 K/uL   WBC Morphology MORPHOLOGY UNREMARKABLE    RBC Morphology MIXED RBC MORPHOLOGY PRESENT    Smear Review Normal platelet morphology    Immature Granulocytes 1 %   Abs Immature Granulocytes 0.09 (H) 0.00 - 0.07 K/uL   Target Cells PRESENT     Comment: Performed at [facility], [address] [place], [place] [address]  Comprehensive metabolic panel     Status: Abnormal   Collection Time: 11/22/20 10:14 AM  Result Value Ref Range    Sodium 135 135 - 145 mmol/L   Potassium 4.1 3.5 - 5.1 mmol/L   Chloride 94 (L) 98 - 111 mmol/L   CO2 21 (L) 22 - 32 mmol/L   Glucose, Bld 99 70 - 99 mg/dL    Comment: Glucose reference range applies only to samples taken after fasting for at least 8 hours.   BUN 7 6 - 20 mg/dL   Creatinine, Ser 0.44 0.44 - 1.00 mg/dL   Calcium 9.0 8.9 - 10.3 mg/dL   Total Protein 7.4 6.5 - 8.1 g/dL   Albumin 4.2 3.5 - 5.0 g/dL   AST 153 (H) 15 - 41 U/L   ALT 76 (H) 0 - 44 U/L   Alkaline Phosphatase 73 38 - 126 U/L   Total Bilirubin 0.9 0.3 - 1.2 mg/dL   GFR, Estimated >60 >60 mL/min    Comment: (NOTE) Calculated using the CKD-EPI Creatinine Equation (2021)    Anion gap 20 (H) 5 - 15    Comment: Performed at [facility], [address] [place], [place] [address]  Ethanol     Status: Abnormal   Collection Time: 11/22/20 10:14 AM  Result Value Ref Range   Alcohol, Ethyl (B) 228 (H) <10 mg/dL    Comment: (NOTE) Lowest detectable limit for serum alcohol is 10 mg/dL.  For medical purposes only. Performed at [facility], [place], [place] [address]   Troponin I (High Sensitivity)     Status: None   Collection Time: 11/22/20 10:14 AM  Result Value Ref Range   Troponin I (High Sensitivity) 11 <18 ng/L    Comment: (NOTE) Elevated high sensitivity troponin I (hsTnI) values and significant  changes across serial measurements may suggest ACS but many other  chronic and acute conditions are known to elevate hsTnI results.  Refer to the "Links" section for chest pain algorithms and additional  guidance. Performed at [facility], [address] [place], [place] [address]   Magnesium     Status: None   Collection Time: 11/22/20 10:14 AM  Result Value Ref Range   Magnesium 2.0 1.7 - 2.4 mg/dL    Comment: Performed at [facility], [address] [place], [place] [address]  Procalcitonin     Status: None   Collection Time: 11/22/20 10:14 AM   Result Value Ref Range   Procalcitonin <0.10 ng/mL    Comment:        Interpretation: PCT (Procalcitonin) <= 0.5 ng/mL: Systemic infection (sepsis) is not likely. Local bacterial infection is possible. (NOTE)       Sepsis PCT Algorithm           Lower Respiratory Tract  Infection PCT Algorithm    ----------------------------     ----------------------------         PCT < 0.25 ng/mL                PCT < 0.10 ng/mL          Strongly encourage             Strongly discourage   discontinuation of antibiotics    initiation of antibiotics    ----------------------------     -----------------------------       PCT 0.25 - 0.50 ng/mL            PCT 0.10 - 0.25 ng/mL               OR       >80% decrease in PCT            Discourage initiation of                                            antibiotics      Encourage discontinuation           of antibiotics    ----------------------------     -----------------------------         PCT >= 0.50 ng/mL              PCT 0.26 - 0.50 ng/mL               AND        <80% decrease in PCT             Encourage initiation of                                             antibiotics       Encourage continuation           of antibiotics    ----------------------------     -----------------------------        PCT >= 0.50 ng/mL                  PCT > 0.50 ng/mL               AND         increase in PCT                  Strongly encourage                                      initiation of antibiotics    Strongly encourage escalation           of antibiotics                                     -----------------------------                                           PCT <= 0.25 ng/mL  OR                                        > 80% decrease in PCT                                      Discontinue / Do not initiate                                              antibiotics  Performed at [facility], [address] [place], [place] [address]   Phosphorus     Status: Abnormal   Collection Time: 11/22/20 10:14 AM  Result Value Ref Range   Phosphorus 5.6 (H) 2.5 - 4.6 mg/dL    Comment: Performed at [facility], [address] [place], [place] [address]  CK     Status: Abnormal   Collection Time: 11/22/20 10:14 AM  Result Value Ref Range   Total CK 306 (H) 38 - 234 U/L    Comment: Performed at [facility], [address] [place], [place] [address]  Resp Panel by RT-PCR (Flu A&B, Covid)     Status: None   Collection Time: 11/22/20  1:24 PM   Specimen: Nasopharyngeal(NP) swabs in vial transport medium  Result Value Ref Range   SARS Coronavirus 2 by RT PCR NEGATIVE NEGATIVE    Comment: (NOTE) SARS-CoV-2 target nucleic acids are NOT DETECTED.  The SARS-CoV-2 RNA is generally detectable in upper respiratory specimens during the acute phase of infection. The lowest concentration of SARS-CoV-2 viral copies this assay can detect is 138 copies/mL. A negative result does not preclude SARS-Cov-2 infection and should not be used as the sole basis for treatment or other patient management decisions. A negative result may occur with  improper specimen collection/handling, submission of specimen other than nasopharyngeal swab, presence of viral mutation(s) within the areas targeted by this assay, and inadequate number of viral copies(<138 copies/mL). A negative result must be combined with clinical observations, patient history, and epidemiological information. The expected result is Negative.  Fact Sheet for Patients:  https://www.fda.gov/media/152166/download  Fact Sheet for Healthcare Providers:  https://www.fda.gov/media/152162/download  This test is no t yet approved or cleared by the [place] FDA and  has been authorized for detection and/or diagnosis of SARS-CoV-2 by FDA under an Emergency Use  Authorization (EUA). This EUA will remain  in effect (meaning this test can be used) for the duration of the COVID-19 declaration under Section 564(b)(1) of the Act, 21 U.S.C.section 360bbb-3(b)(1), unless the authorization is terminated  or revoked sooner.       Influenza A by PCR NEGATIVE NEGATIVE   Influenza B by PCR NEGATIVE NEGATIVE    Comment: (NOTE) The Xpert Xpress SARS-CoV-2/FLU/RSV plus assay is intended as an aid in the diagnosis of influenza from Nasopharyngeal swab specimens and should not be used as a sole basis for treatment. Nasal washings and aspirates are unacceptable for Xpert Xpress SARS-CoV-2/FLU/RSV testing.  Fact Sheet for Patients: https://www.fda.gov/media/152166/download  Fact Sheet for Healthcare Providers: https://www.fda.gov/media/152162/download  This test is not yet approved or cleared by the [place] FDA and has been authorized for detection and/or diagnosis of SARS-CoV-2 by FDA under   an Emergency Use Authorization (EUA). This EUA will remain in effect (meaning this test can be used) for the duration of the COVID-19 declaration under Section 564(b)(1) of the Act, 21 U.S.C. section 360bbb-3(b)(1), unless the authorization is terminated or revoked.  Performed at [facility], [address] [place], [place] [address]   HIV Antibody (routine testing w rflx)     Status: None   Collection Time: 11/22/20  1:24 PM  Result Value Ref Range   HIV Screen 4th Generation wRfx Non Reactive Non Reactive    Comment: Performed at [facility], [place] [address], [place], [place] [address]  Vitamin B12     Status: None   Collection Time: 11/22/20  1:24 PM  Result Value Ref Range   Vitamin B-12 262 180 - 914 pg/mL    Comment: (NOTE) This assay is not validated for testing neonatal or myeloproliferative syndrome specimens for Vitamin B12 levels. Performed at [facility], [place] [address] [place], [place] [address]   Troponin I  (High Sensitivity)     Status: None   Collection Time: 11/22/20  5:55 PM  Result Value Ref Range   Troponin I (High Sensitivity) 9 <18 ng/L    Comment: (NOTE) Elevated high sensitivity troponin I (hsTnI) values and significant  changes across serial measurements may suggest ACS but many other  chronic and acute conditions are known to elevate hsTnI results.  Refer to the "Links" section for chest pain algorithms and additional  guidance. Performed at [facility], [address] [place], [place] [address]   Basic metabolic panel     Status: Abnormal   Collection Time: 11/23/20  6:24 AM  Result Value Ref Range   Sodium 137 135 - 145 mmol/L   Potassium 3.2 (L) 3.5 - 5.1 mmol/L   Chloride 100 98 - 111 mmol/L   CO2 28 22 - 32 mmol/L   Glucose, Bld 87 70 - 99 mg/dL    Comment: Glucose reference range applies only to samples taken after fasting for at least 8 hours.   BUN 11 6 - 20 mg/dL   Creatinine, Ser 0.58 0.44 - 1.00 mg/dL   Calcium 8.4 (L) 8.9 - 10.3 mg/dL   GFR, Estimated >60 >60 mL/min    Comment: (NOTE) Calculated using the CKD-EPI Creatinine Equation (2021)    Anion gap 9 5 - 15    Comment: Performed at [facility], [address] [place], [place] [address]  Procalcitonin     Status: None   Collection Time: 11/23/20  6:24 AM  Result Value Ref Range   Procalcitonin <0.10 ng/mL    Comment:        Interpretation: PCT (Procalcitonin) <= 0.5 ng/mL: Systemic infection (sepsis) is not likely. Local bacterial infection is possible. (NOTE)       Sepsis PCT Algorithm           Lower Respiratory Tract                                      Infection PCT Algorithm    ----------------------------     ----------------------------         PCT < 0.25 ng/mL                PCT < 0.10 ng/mL          Strongly encourage  Strongly discourage   discontinuation of antibiotics    initiation of antibiotics    ----------------------------      -----------------------------       PCT 0.25 - 0.50 ng/mL            PCT 0.10 - 0.25 ng/mL               OR       >80% decrease in PCT            Discourage initiation of                                            antibiotics      Encourage discontinuation           of antibiotics    ----------------------------     -----------------------------         PCT >= 0.50 ng/mL              PCT 0.26 - 0.50 ng/mL               AND        <80% decrease in PCT             Encourage initiation of                                             antibiotics       Encourage continuation           of antibiotics    ----------------------------     -----------------------------        PCT >= 0.50 ng/mL                  PCT > 0.50 ng/mL               AND         increase in PCT                  Strongly encourage                                      initiation of antibiotics    Strongly encourage escalation           of antibiotics                                     -----------------------------                                           PCT <= 0.25 ng/mL                                                 OR                                        >   80% decrease in PCT                                      Discontinue / Do not initiate                                             antibiotics  Performed at [facility], [address] [place], [place] [address]   Protime-INR     Status: None   Collection Time: 11/23/20  6:24 AM  Result Value Ref Range   Prothrombin Time 12.8 11.4 - 15.2 seconds   INR 1.0 0.8 - 1.2    Comment: (NOTE) INR goal varies based on device and disease states. Performed at [facility], [address] [place], [place] [address]   APTT     Status: None   Collection Time: 11/23/20  6:24 AM  Result Value Ref Range   aPTT 33 24 - 36 seconds    Comment: Performed at [facility], [address] [place], [place] [address]  Retic Panel     Status:  Abnormal   Collection Time: 11/23/20  6:24 AM  Result Value Ref Range   Retic Ct Pct 1.1 0.4 - 3.1 %   RBC. 3.77 (L) 3.87 - 5.11 MIL/uL   Retic Count, Absolute 39.6 19.0 - 186.0 K/uL   Immature Retic Fract 27.1 (H) 2.3 - 15.9 %   Reticulocyte Hemoglobin 25.6 (L) >27.9 pg    Comment:        A RET-He < 28 pg is an indication of iron-deficient or iron- insufficient erythropoiesis. Patients with thalassemia may also have a decreased RET-He result unrelated to iron availability.     If this patient has chronic kidney disease and does not have a hemoglobinopathy he/she meets criteria for iron deficiency per the 2016 NICE guidelines. Refer to specific guidelines to determine the appropriate thresholds for treating CKD- associated iron deficiency. TSAT and ferritin should be used in patients with hemoglobinopathies (e.g. thalassemia). Performed at [facility], [address] [place], [place] [address]   ABO/Rh     Status: None   Collection Time: 11/23/20  7:45 AM  Result Value Ref Range   ABO/RH(D)      O POS Performed at [facility], [address] [place], [place] [address]   CBC     Status: Abnormal   Collection Time: 11/23/20  7:46 AM  Result Value Ref Range   WBC 3.7 (L) 4.0 - 10.5 K/uL   RBC 2.96 (L) 3.87 - 5.11 MIL/uL   Hemoglobin 5.3 (L) 12.0 - 15.0 g/dL    Comment: REPEATED TO VERIFY Reticulocyte Hemoglobin testing may be clinically indicated, consider ordering this additional test [id number]    HCT 18.0 (L) 36.0 - 46.0 %   MCV 60.8 (L) 80.0 - 100.0 fL   MCH 17.9 (L) 26.0 - 34.0 pg   MCHC 29.4 (L) 30.0 - 36.0 g/dL   RDW 19.3 (H) 11.5 - 15.5 %   Platelets 125 (L) 150 - 400 K/uL    Comment: Immature Platelet Fraction may be clinically indicated, consider ordering this additional test [id number]    nRBC 0.0 0.0 - 0.2 %    Comment: Performed at [facility], [address] [place], [place]  [address]  Type and screen [place]     Status: None (Preliminary result)   Collection Time: 11/23/20  8:06 AM  Result Value Ref Range   ABO/RH(D) O POS    Antibody Screen NEG    Sample Expiration 11/26/2020,2359    Unit Number [id number]    Blood Component Type RED CELLS,LR    Unit division 00    Status of Unit ISSUED    Transfusion Status OK TO TRANSFUSE    Crossmatch Result      Compatible Performed at [facility], [address], [place], [place] [address]    Unit Number [id number]    Blood Component Type RED CELLS,LR    Unit division 00    Status of Unit ISSUED    Transfusion Status OK TO TRANSFUSE    Crossmatch Result Compatible   Ferritin     Status: None   Collection Time: 11/23/20  9:21 AM  Result Value Ref Range   Ferritin 39 11 - 307 ng/mL    Comment: Performed at [facility], [address] [place], [place] [address]  Iron and TIBC     Status: Abnormal   Collection Time: 11/23/20  9:21 AM  Result Value Ref Range   Iron 187 (H) 28 - 170 ug/dL   TIBC 325 250 - 450 ug/dL   Saturation Ratios 58 (H) 10.4 - 31.8 %   UIBC 138 ug/dL    Comment: Performed at [facility], [address] [place], [place] [address]  Prepare RBC (crossmatch)     Status: None   Collection Time: 11/23/20  9:30 AM  Result Value Ref Range   Order Confirmation      ORDER PROCESSED BY BLOOD BANK Performed at [facility], [address], [place], [place] [address]   Urine Drug Screen, Qualitative     Status: Abnormal   Collection Time: 11/23/20  3:50 PM  Result Value Ref Range   Tricyclic, Ur Screen NONE DETECTED NONE DETECTED   Amphetamines, Ur Screen NONE DETECTED NONE DETECTED   MDMA (Ecstasy)Ur Screen NONE DETECTED NONE DETECTED   Cocaine Metabolite,Ur [place] NONE DETECTED NONE DETECTED   Opiate, Ur Screen NONE DETECTED NONE DETECTED   Phencyclidine (PCP) Ur S NONE DETECTED NONE DETECTED   Cannabinoid 50 Ng, Ur [place] NONE DETECTED NONE DETECTED    Barbiturates, Ur Screen NONE DETECTED NONE DETECTED   Benzodiazepine, Ur Scrn POSITIVE (A) NONE DETECTED   Methadone Scn, Ur NONE DETECTED NONE DETECTED    Comment: (NOTE) Tricyclics + metabolites, urine    Cutoff 1000 ng/mL Amphetamines + metabolites, urine  Cutoff 1000 ng/mL MDMA (Ecstasy), urine              Cutoff 500 ng/mL Cocaine Metabolite, urine          Cutoff 300 ng/mL Opiate + metabolites, urine        Cutoff 300 ng/mL Phencyclidine (PCP), urine         Cutoff 25 ng/mL Cannabinoid, urine                 Cutoff 50 ng/mL Barbiturates + metabolites, urine  Cutoff 200 ng/mL Benzodiazepine, urine              Cutoff 200 ng/mL Methadone, urine                   Cutoff 300 ng/mL  The urine drug screen provides only a preliminary, unconfirmed analytical test result  and should not be used for non-medical purposes. Clinical consideration and professional judgment should be applied to any positive drug screen result due to possible interfering substances. A more specific alternate chemical method must be used in order to obtain a confirmed analytical result. Gas chromatography / mass spectrometry (GC/MS) is the preferred confirm atory method. Performed at [facility], [address] [place], [place] [address]   Pregnancy, urine     Status: None   Collection Time: 11/23/20  3:50 PM  Result Value Ref Range   Preg Test, Ur NEGATIVE NEGATIVE    Comment: Performed at [facility], [address] [place], [place] [address]  Hemoglobin and hematocrit, blood     Status: Abnormal   Collection Time: 11/23/20  6:47 PM  Result Value Ref Range   Hemoglobin 10.7 (L) 12.0 - 15.0 g/dL    Comment: REPEATED TO VERIFY   HCT 36.0 36.0 - 46.0 %    Comment: Performed at [facility], [address] [place], [place] [address]    DG Chest 2 View  Result Date: 11/22/2020 CLINICAL DATA:  Decreased consciousness EXAM: CHEST - 2 VIEW COMPARISON:  10/22/2015 from  [facility]. FINDINGS: Cholecystectomy clips. Midline trachea. Normal heart size and mediastinal contours. No pleural effusion or pneumothorax. Mediastinal surgical clips. Clear lungs. IMPRESSION: No acute cardiopulmonary disease. Electronically Signed   By: Kyle  Talbot M.D.   On: 11/22/2020 13:33   CT Head Wo Contrast  Result Date: 11/22/2020 CLINICAL DATA:  Head trauma, abnormal mental status (Age 19-64y); Neck trauma, intoxicated or obtunded (Age >= 16y) EXAM: CT HEAD WITHOUT CONTRAST CT CERVICAL SPINE WITHOUT CONTRAST TECHNIQUE: Multidetector CT imaging of the head and cervical spine was performed following the standard protocol without intravenous contrast. Multiplanar CT image reconstructions of the cervical spine were also generated. COMPARISON:  None. FINDINGS: CT HEAD FINDINGS Brain: No evidence of acute large vascular territory infarction, hemorrhage, hydrocephalus, extra-axial collection or mass lesion/mass effect. Encephalomalacia in the left frontal lobe, extending to the left anterior periventricular white matter. Vascular: No hyperdense vessel identified. Skull: Left frontal and right occipital burr holes. No acute fracture. Sinuses/Orbits: Visualized sinuses are clear. Visualized orbits are unremarkable. Other: No mastoid effusions. CT CERVICAL SPINE FINDINGS Alignment: No substantial sagittal subluxation. Mild broad levocurvature. Skull base and vertebrae: No evidence of acute fracture. Vertebral body heights are maintained. Soft tissues and spinal canal: No prevertebral fluid or swelling. No visible canal hematoma. Approximately 8 mm right thyroid nodule, which is not require further imaging follow-up (ref: J Am Coll Radiol. 2015 Feb;12(2): 143-50). Disc levels: Mild-to-moderate degenerative disease on the left at C6-C7 where there is endplate sclerosis, disc height loss and posterior endplate spurring. Upper chest: Visualized lung apices are clear. IMPRESSION: CT head: 1. No evidence of  acute intracranial abnormality. 2. Left frontal lobe encephalomalacia. CT cervical spine: 1. No evidence of acute fracture or traumatic malalignment. 2. Mild-to-moderate C6-C7 degenerative change. Electronically Signed   By: Frederick S Jones M.D.   On: 11/22/2020 11:39   CT Cervical Spine Wo Contrast  Result Date: 11/22/2020 CLINICAL DATA:  Head trauma, abnormal mental status (Age 19-64y); Neck trauma, intoxicated or obtunded (Age >= 16y) EXAM: CT HEAD WITHOUT CONTRAST CT CERVICAL SPINE WITHOUT CONTRAST TECHNIQUE: Multidetector CT imaging of the head and cervical spine was performed following the standard protocol without intravenous contrast. Multiplanar CT image reconstructions of the cervical spine were also generated. COMPARISON:  None. FINDINGS: CT HEAD FINDINGS Brain: No evidence of acute large   vascular territory infarction, hemorrhage, hydrocephalus, extra-axial collection or mass lesion/mass effect. Encephalomalacia in the left frontal lobe, extending to the left anterior periventricular white matter. Vascular: No hyperdense vessel identified. Skull: Left frontal and right occipital burr holes. No acute fracture. Sinuses/Orbits: Visualized sinuses are clear. Visualized orbits are unremarkable. Other: No mastoid effusions. CT CERVICAL SPINE FINDINGS Alignment: No substantial sagittal subluxation. Mild broad levocurvature. Skull base and vertebrae: No evidence of acute fracture. Vertebral body heights are maintained. Soft tissues and spinal canal: No prevertebral fluid or swelling. No visible canal hematoma. Approximately 8 mm right thyroid nodule, which is not require further imaging follow-up (ref: J Am Coll Radiol. 2015 Feb;12(2): 143-50). Disc levels: Mild-to-moderate degenerative disease on the left at C6-C7 where there is endplate sclerosis, disc height loss and posterior endplate spurring. Upper chest: Visualized lung apices are clear. IMPRESSION: CT head: 1. No evidence of acute intracranial  abnormality. 2. Left frontal lobe encephalomalacia. CT cervical spine: 1. No evidence of acute fracture or traumatic malalignment. 2. Mild-to-moderate C6-C7 degenerative change. Electronically Signed   By: Frederick S Jones M.D.   On: 11/22/2020 11:39   US PELVIS (TRANSABDOMINAL ONLY)  Result Date: 11/23/2020 CLINICAL DATA:  Intermittent irregular vaginal bleeding for 3 months EXAM: TRANSABDOMINAL ULTRASOUND OF PELVIS TECHNIQUE: Transabdominal ultrasound examination of the pelvis was performed including evaluation of the uterus, ovaries, adnexal regions, and pelvic cul-de-sac. Transvaginal imaging was not ordered. Transabdominal exam was terminated prematurely by patient due to needing to void, with incomplete adnexal assessment. COMPARISON:  None FINDINGS: Uterus Measurements: 9.0 x 5.0 x 5.7 cm = volume: 134 mL. Retroverted. Normal morphology without mass Endometrium Thickness: 9 mm. Small amount of nonspecific endometrial fluid. No discrete endometrial mass. Right ovary Measurements: 5.8 x 3.4 x 3.1 cm = volume: 32 mL. Complex hypoechoic nodule 3.0 cm diameter, question hemorrhagic cyst. Additional complex hypoechoic nodule with internal echogenicity, potentially small corpus luteum. Left ovary Measurements: 3.5 x 2.3 x 1.6 cm = volume: 6.8 mL. Normal morphology without mass Other findings: No free pelvic fluid. No adnexal masses grossly identified. IMPRESSION: Small amount of nonspecific endometrial fluid. Complex hypoechoic nodule RIGHT ovary 3.0 cm diameter, question hemorrhagic cyst, suboptimally assessed; either transvaginal ultrasound characterization now or follow-up ultrasound in 6-12 weeks recommended. Electronically Signed   By: Mark  Boles M.D.   On: 11/23/2020 18:38    Review of Systems Blood pressure (!) 143/94, pulse 97, temperature (!) 97.5 F (36.4 C), temperature source Oral, resp. rate 20, height 5' 7" (1.702 m), weight 54.4 kg, SpO2 100 %. Physical Exam Vitals and nursing note  reviewed.  Constitutional:      Appearance: Normal appearance. She is well-developed.  HENT:     Head: Normocephalic and atraumatic.  Cardiovascular:     Rate and Rhythm: Normal rate and regular rhythm.  Pulmonary:     Effort: Pulmonary effort is normal.     Breath sounds: Normal breath sounds.  Abdominal:     General: Bowel sounds are normal.     Palpations: Abdomen is soft.  Genitourinary:    Comments: External: Normal appearing vulva. No lesions noted.  Speculum examination: Enlarged anterior cervix.  No blood in the vaginal vault.   Bimanual examination: Limited with abdominal tension. Cervix is firm and enlarged.  Musculoskeletal:        General: Normal range of motion.  Skin:    General: Skin is warm and dry.  Neurological:     Mental Status: She is alert and oriented to person, place, and time.    Psychiatric:        Behavior: Behavior normal.        Thought Content: Thought content normal.        Judgment: Judgment normal.    Assessment/Plan: 45 yo with Abnormal uterine bleeding Pelvic exam today limited, but no visual cervical abnormality was noted. Cervix did feel firm and enlarged.  Not able to access uterus and ovaries on bimanual exam because of patient discomfort.  With review of care everywhere the patient had a normal NIL Pap smear on 06/05/2020.  Patient's vaginal bleeding is minimal  does not currently warrant treatment with p.o. progestin or estrogen for acute heavy bleeding.  Can plan for interval follow-up with the patient outpatient for continued evaluation of source of abnormal uterine bleeding. Complex right ovarian cyst- transvaginal US pending. Will consider CA125. Will likely need outpatient follow up imaging in 6-12 weeks.   Shadawn Hanaway R Olena Willy 11/23/2020, 7:57 PM

## 2020-11-23 NOTE — ED Notes (Signed)
Rn to bedside. Pt sleeping.

## 2020-11-23 NOTE — ED Notes (Signed)
RN assisted pt to the toilet. RN observed that pt is very lethargic and is having difficulty following simple commands as if pt is under the influence of a substance. Pt is tangled in monitor wires and IV line. RN instructed pt to be able to get IV tubbing un tangled and pt disreguarded the task at hand. RN notified primary RN of findings. Pt will talk to staff but does not open her eyes while having the conversation. Pt is unsteady on her feet while RN walked pt to the commode.

## 2020-11-23 NOTE — ED Notes (Signed)
Pt helped to toilet. Pt has bright red blood when she wipes. Will notify MD><

## 2020-11-23 NOTE — ED Notes (Signed)
Pt complaint of nausea,

## 2020-11-23 NOTE — Progress Notes (Signed)
Please note, that the patient's nurse stated that patient is now reporting that she has been having vaginal bleeding at home.  Dr. Leslye Peer plans on consulting OB/GYN in this regard.  However, since patient also reported melena at home a month ago, with frequent NSAID use, and has previous history of gastric ulcers, EGD is still appropriate to rule out any gastric sources for peptic ulcer disease that could have caused her melena about a month ago.  Patient is still receiving PRBC transfusion and repeat hemoglobin is pending.  Plan on upper endoscopy tomorrow for further evaluation of melena after medical optimization and improvement in hemoglobin with PRBCs today  N.p.o. past midnight

## 2020-11-23 NOTE — ED Notes (Signed)
Pt ambulated to the commode with standby assistance by Lorriane Shire, Therapist, sports.

## 2020-11-24 ENCOUNTER — Encounter
Admission: EM | Disposition: A | Discharge status: Home / Self Care | Payer: Self-pay | Source: Home / Self Care | Attending: Internal Medicine

## 2020-11-24 ENCOUNTER — Inpatient Hospital Stay: Payer: Medicare Other | Admitting: Anesthesiology

## 2020-11-24 ENCOUNTER — Inpatient Hospital Stay: Payer: Medicare Other

## 2020-11-24 ENCOUNTER — Encounter: Payer: Self-pay | Admitting: Gastroenterology

## 2020-11-24 DIAGNOSIS — D509 Iron deficiency anemia, unspecified: Secondary | ICD-10-CM

## 2020-11-24 DIAGNOSIS — N939 Abnormal uterine and vaginal bleeding, unspecified: Secondary | ICD-10-CM

## 2020-11-24 DIAGNOSIS — R569 Unspecified convulsions: Secondary | ICD-10-CM

## 2020-11-24 DIAGNOSIS — D62 Acute posthemorrhagic anemia: Secondary | ICD-10-CM

## 2020-11-24 HISTORY — PX: ESOPHAGOGASTRODUODENOSCOPY: SHX5428

## 2020-11-24 LAB — BPAM RBC
Blood Product Expiration Date: 202212072359
Blood Product Expiration Date: 202212092359
ISSUE DATE / TIME: 202211080952
ISSUE DATE / TIME: 202211081427
Unit Type and Rh: 5100
Unit Type and Rh: 5100

## 2020-11-24 LAB — CBC
HCT: 29.7 % — ABNORMAL LOW (ref 36.0–46.0)
Hemoglobin: 9.2 g/dL — ABNORMAL LOW (ref 12.0–15.0)
MCH: 20.4 pg — ABNORMAL LOW (ref 26.0–34.0)
MCHC: 31 g/dL (ref 30.0–36.0)
MCV: 65.9 fL — ABNORMAL LOW (ref 80.0–100.0)
Platelets: 134 10*3/uL — ABNORMAL LOW (ref 150–400)
RBC: 4.51 MIL/uL (ref 3.87–5.11)
RDW: 25.1 % — ABNORMAL HIGH (ref 11.5–15.5)
WBC: 4.5 10*3/uL (ref 4.0–10.5)
nRBC: 0 % (ref 0.0–0.2)

## 2020-11-24 LAB — TYPE AND SCREEN
ABO/RH(D): O POS
Antibody Screen: NEGATIVE
Unit division: 0
Unit division: 0

## 2020-11-24 LAB — BASIC METABOLIC PANEL
Anion gap: 8 (ref 5–15)
BUN: <5 mg/dL — ABNORMAL LOW (ref 6–20)
CO2: 25 mmol/L (ref 22–32)
Calcium: 8.4 mg/dL — ABNORMAL LOW (ref 8.9–10.3)
Chloride: 104 mmol/L (ref 98–111)
Creatinine, Ser: 0.58 mg/dL (ref 0.44–1.00)
GFR, Estimated: >60 mL/min (ref >60)
Glucose, Bld: 88 mg/dL (ref 70–99)
Potassium: 3.1 mmol/L — ABNORMAL LOW (ref 3.5–5.1)
Sodium: 137 mmol/L (ref 135–145)

## 2020-11-24 LAB — VITAMIN B12: Vitamin B-12: 232 pg/mL (ref 180–914)

## 2020-11-24 LAB — FOLATE: Folate: 29 ng/mL (ref >5.9)

## 2020-11-24 LAB — PROCALCITONIN: Procalcitonin: <0.1 ng/mL

## 2020-11-24 SURGERY — EGD (ESOPHAGOGASTRODUODENOSCOPY)
Anesthesia: General

## 2020-11-24 MED ORDER — BOOST / RESOURCE BREEZE PO LIQD CUSTOM
1.0000 | Freq: Three times a day (TID) | ORAL | Status: DC
  Administered 2020-11-24 – 2020-11-25 (×3): 1 via ORAL

## 2020-11-24 MED ORDER — POTASSIUM CHLORIDE CRYS ER 20 MEQ PO TBCR
40.0000 meq | EXTENDED_RELEASE_TABLET | Freq: Once | ORAL | Status: AC
  Administered 2020-11-24: 40 meq via ORAL
  Filled 2020-11-24: qty 2

## 2020-11-24 MED ORDER — MIDAZOLAM HCL 2 MG/2ML IJ SOLN
INTRAMUSCULAR | Status: DC | PRN
  Administered 2020-11-24: 2 mg via INTRAVENOUS

## 2020-11-24 MED ORDER — LIDOCAINE HCL (CARDIAC) PF 100 MG/5ML IV SOSY
PREFILLED_SYRINGE | INTRAVENOUS | Status: DC | PRN
  Administered 2020-11-24: 50 mg via INTRAVENOUS

## 2020-11-24 MED ORDER — THIAMINE HCL 100 MG/ML IJ SOLN
500.0000 mg | Freq: Three times a day (TID) | INTRAVENOUS | Status: DC
  Administered 2020-11-24 – 2020-11-25 (×5): 500 mg via INTRAVENOUS
  Filled 2020-11-24 (×6): qty 5

## 2020-11-24 MED ORDER — SODIUM CHLORIDE 0.9 % IV SOLN
12.5000 mg | Freq: Every day | INTRAVENOUS | Status: DC | PRN
  Administered 2020-11-24: 12.5 mg via INTRAVENOUS
  Filled 2020-11-24 (×2): qty 0.5

## 2020-11-24 MED ORDER — MORPHINE SULFATE (PF) 2 MG/ML IV SOLN
2.0000 mg | INTRAVENOUS | Status: AC | PRN
  Administered 2020-11-24 – 2020-11-25 (×2): 2 mg via INTRAVENOUS
  Filled 2020-11-24 (×2): qty 1

## 2020-11-24 MED ORDER — PANCRELIPASE (LIP-PROT-AMYL) 36000-114000 UNITS PO CPEP
36000.0000 [IU] | ORAL_CAPSULE | Freq: Three times a day (TID) | ORAL | Status: DC
Start: 2020-11-24 — End: 2020-11-27
  Administered 2020-11-24 – 2020-11-27 (×8): 36000 [IU] via ORAL
  Filled 2020-11-24 (×10): qty 1

## 2020-11-24 MED ORDER — ONDANSETRON HCL 4 MG/2ML IJ SOLN
4.0000 mg | Freq: Four times a day (QID) | INTRAMUSCULAR | Status: AC | PRN
  Administered 2020-11-24 – 2020-11-26 (×4): 4 mg via INTRAVENOUS
  Filled 2020-11-24 (×4): qty 2

## 2020-11-24 MED ORDER — OXYCODONE HCL 5 MG PO TABS
5.0000 mg | ORAL_TABLET | ORAL | Status: DC | PRN
  Administered 2020-11-24 – 2020-11-27 (×14): 5 mg via ORAL
  Filled 2020-11-24 (×15): qty 1

## 2020-11-24 MED ORDER — FENTANYL CITRATE (PF) 100 MCG/2ML IJ SOLN
INTRAMUSCULAR | Status: DC | PRN
  Administered 2020-11-24 (×2): 50 ug via INTRAVENOUS

## 2020-11-24 MED ORDER — PROPOFOL 500 MG/50ML IV EMUL
INTRAVENOUS | Status: AC
  Filled 2020-11-24: qty 50

## 2020-11-24 MED ORDER — GADOBUTROL 1 MMOL/ML IV SOLN
5.0000 mL | Freq: Once | INTRAVENOUS | Status: AC | PRN
  Administered 2020-11-24: 5 mL via INTRAVENOUS

## 2020-11-24 MED ORDER — PROPOFOL 500 MG/50ML IV EMUL
INTRAVENOUS | Status: DC | PRN
  Administered 2020-11-24: 50 ug/kg/min via INTRAVENOUS

## 2020-11-24 MED ORDER — LOPERAMIDE HCL 2 MG PO CAPS
2.0000 mg | ORAL_CAPSULE | Freq: Three times a day (TID) | ORAL | Status: DC | PRN
  Administered 2020-11-24: 2 mg via ORAL
  Filled 2020-11-24: qty 1

## 2020-11-24 MED ORDER — ONDANSETRON HCL 4 MG/2ML IJ SOLN
INTRAMUSCULAR | Status: AC
  Filled 2020-11-24: qty 2

## 2020-11-24 MED ORDER — PROPOFOL 10 MG/ML IV BOLUS
INTRAVENOUS | Status: DC | PRN
  Administered 2020-11-24: 30 mg via INTRAVENOUS

## 2020-11-24 MED ORDER — MIDAZOLAM HCL 2 MG/2ML IJ SOLN
INTRAMUSCULAR | Status: AC
  Filled 2020-11-24: qty 2

## 2020-11-24 MED ORDER — SODIUM CHLORIDE 0.9 % IV SOLN: INTRAVENOUS | Status: DC | PRN

## 2020-11-24 MED ORDER — FENTANYL CITRATE (PF) 100 MCG/2ML IJ SOLN
INTRAMUSCULAR | Status: AC
  Filled 2020-11-24: qty 2

## 2020-11-24 MED ORDER — ONDANSETRON HCL 4 MG/2ML IJ SOLN
INTRAMUSCULAR | Status: DC | PRN
  Administered 2020-11-24: 4 mg via INTRAVENOUS

## 2020-11-24 MED ORDER — SODIUM CHLORIDE 0.9 % IV SOLN
12.5000 mg | Freq: Four times a day (QID) | INTRAVENOUS | Status: AC | PRN
  Administered 2020-11-25 – 2020-11-26 (×2): 12.5 mg via INTRAVENOUS
  Filled 2020-11-24 (×2): qty 12.5
  Filled 2020-11-24: qty 0.5

## 2020-11-24 MED ORDER — ONDANSETRON HCL 4 MG PO TABS
4.0000 mg | ORAL_TABLET | Freq: Four times a day (QID) | ORAL | Status: AC | PRN
  Administered 2020-11-25: 4 mg via ORAL
  Filled 2020-11-24: qty 1

## 2020-11-24 MED ORDER — SODIUM CHLORIDE 0.9 % IV SOLN
300.0000 mg | Freq: Once | INTRAVENOUS | Status: AC
  Administered 2020-11-24: 300 mg via INTRAVENOUS
  Filled 2020-11-24: qty 300

## 2020-11-24 NOTE — Op Note (Signed)
[facility] Patient Name: Vanessa Romero Procedure Date: 11/24/2020 11:25 AM MRN: [id number] Account #: [id number] Date of Birth: Aug 13, 1975 Admit Type: Outpatient Age: 45 Room: [facility] ENDO ROOM 3 Gender: Female Note Status: Finalized Instrument Name: Altamese Cabal Endoscope [id number] Procedure:             Upper GI endoscopy Indications:           Epigastric abdominal pain, Unexplained iron deficiency                         anemia Providers:             Lin Landsman MD, MD Medicines:             General Anesthesia Complications:         No immediate complications. Estimated blood loss: None. Procedure:             Pre-Anesthesia Assessment:                        - Prior to the procedure, a History and Physical was                         performed, and patient medications and allergies were                         reviewed. The patient is competent. The risks and                         benefits of the procedure and the sedation options and                         risks were discussed with the patient. All questions                         were answered and informed consent was obtained.                         Patient identification and proposed procedure were                         verified by the physician, the nurse, the                         anesthesiologist, the anesthetist and the technician                         in the pre-procedure area in the procedure room in the                         endoscopy suite. Mental Status Examination: alert and                         oriented. Airway Examination: normal oropharyngeal                         airway and neck mobility. Respiratory Examination:                         clear to auscultation. CV  Examination: normal.                         Prophylactic Antibiotics: The patient does not require                         prophylactic antibiotics. Prior Anticoagulants: The                          patient has taken no previous anticoagulant or                         antiplatelet agents. ASA Grade Assessment: III - A                         patient with severe systemic disease. After reviewing                         the risks and benefits, the patient was deemed in                         satisfactory condition to undergo the procedure. The                         anesthesia plan was to use general anesthesia.                         Immediately prior to administration of medications,                         the patient was re-assessed for adequacy to receive                         sedatives. The heart rate, respiratory rate, oxygen                         saturations, blood pressure, adequacy of pulmonary                         ventilation, and response to care were monitored                         throughout the procedure. The physical status of the                         patient was re-assessed after the procedure.                        After obtaining informed consent, the endoscope was                         passed under direct vision. Throughout the procedure,                         the patient's blood pressure, pulse, and oxygen                         saturations were monitored continuously. The Endoscope  was introduced through the mouth, and advanced to the                         afferent jejunal loop. The upper GI endoscopy was                         accomplished without difficulty. The patient tolerated                         the procedure well. Findings:      Evidence of a Roux-en-Y gastrojejunostomy was found. The gastrojejunal       anastomosis was characterized by healthy appearing mucosa. This was       traversed. The pouch-to-jejunum limb was characterized by healthy       appearing mucosa. The duodenum-to-jejunum limb was not examined as it       could not be reached. Jejunum was normal      The entire examined stomach was normal  WHICH CONSISTED OF A VERY SMALL       GASTRIC POUCH.      The gastroesophageal junction and examined esophagus were normal. Impression:            - Roux-en-Y gastrojejunostomy with gastrojejunal                         anastomosis characterized by healthy appearing mucosa.                        - Normal stomach.                        - Normal gastroesophageal junction and esophagus.                        - No specimens collected. Recommendation:        - Return patient to hospital ward for ongoing care.                        - Advance diet as tolerated today.                        - Continue present medications. Procedure Code(s):     --- Professional ---                        [phone], Esophagogastroduodenoscopy, flexible,                         transoral; diagnostic, including collection of                         specimen(s) by brushing or washing, when performed                         (separate procedure) Diagnosis Code(s):     --- Professional ---                        Z98.0, Intestinal bypass and anastomosis status                        R10.13, Epigastric pain  D50.9, Iron deficiency anemia, unspecified CPT copyright 2019 American Medical Association. All rights reserved. The codes documented in this report are preliminary and upon coder review may  be revised to meet current compliance requirements. Dr. Ulyess Mort Lin Landsman MD, MD 11/24/2020 11:53:43 AM This report has been signed electronically. Number of Addenda: 0 Note Initiated On: 11/24/2020 11:25 AM Estimated Blood Loss:  Estimated blood loss: none.      [facility]

## 2020-11-24 NOTE — Anesthesia Postprocedure Evaluation (Signed)
Anesthesia Post Note  Patient: Vanessa Romero  Procedure(s) Performed: ESOPHAGOGASTRODUODENOSCOPY (EGD)  Patient location during evaluation: PACU Anesthesia Type: General Level of consciousness: awake and alert Pain management: pain level controlled Vital Signs Assessment: post-procedure vital signs reviewed and stable Respiratory status: spontaneous breathing, nonlabored ventilation, respiratory function stable and patient connected to nasal cannula oxygen Cardiovascular status: blood pressure returned to baseline and stable Postop Assessment: no apparent nausea or vomiting Anesthetic complications: no   No notable events documented.   Last Vitals:  Vitals:   11/24/20 1206 11/24/20 1216  BP: 114/76 116/81  Pulse: 61 62  Resp: 13 15  Temp:    SpO2: 98% 97%    Last Pain:  Vitals:   11/24/20 1216  TempSrc:   PainSc: [place]

## 2020-11-24 NOTE — Transfer of Care (Signed)
Immediate Anesthesia Transfer of Care Note  Patient: Vanessa Romero  Procedure(s) Performed: ESOPHAGOGASTRODUODENOSCOPY (EGD)  Patient Location: PACU  Anesthesia Type:General  Level of Consciousness: sedated  Airway & Oxygen Therapy: Patient Spontanous Breathing and Patient connected to nasal cannula oxygen  Post-op Assessment: Report given to RN and Post -op Vital signs reviewed and stable  Post vital signs: Reviewed and stable  Last Vitals:  Vitals Value Taken Time  BP 110/71 11/24/20 1154  Temp 36.1 C 11/24/20 1154  Pulse 62 11/24/20 1158  Resp 14 11/24/20 1158  SpO2 99 % 11/24/20 1158  Vitals shown include unvalidated device data.  Last Pain:  Vitals:   11/24/20 1154  TempSrc: Temporal  PainSc: Asleep         Complications: No notable events documented.

## 2020-11-24 NOTE — Anesthesia Preprocedure Evaluation (Addendum)
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 alcohol abuse and dependence   Neuro/Psych  Headaches, Seizures -,  Anxiety Depression Bipolar Disorder   Pseudotumor cerebri, hx of VPS now removed    GI/Hepatic (+)     substance abuse (etoh - 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 Normal left ventricular size and systolic function, ejection fraction  55-60%.  2. Normal right ventricular size and systolic function.  3. No significant valvular abnormalities.    Reproductive/Obstetrics                           Anesthesia Physical Anesthesia Plan  ASA: 3 and emergent  Anesthesia Plan: General   Post-op Pain Management:    Induction:   PONV Risk Score and Plan:   Airway Management Planned: Natural Airway  Additional Equipment:   Intra-op Plan:    Post-operative Plan:   Informed Consent: I have reviewed the patients History and Physical, chart, labs and discussed the procedure including the risks, benefits and alternatives for the proposed anesthesia with the patient or authorized representative who has indicated his/her understanding and acceptance.       Plan Discussed with: CRNA  Anesthesia Plan Comments: (Unclear if small OA was effort dependent but she does have a hx of a challenging intubation)        Anesthesia Quick Evaluation

## 2020-11-24 NOTE — H&P (Signed)
  Vanessa Darby, MD [address]  [place]  [place], [place] [address]  Main: [phone]  Fax: [phone] Pager: [phone]  Primary Care Physician:  Anselmo Pickler, MD Primary Gastroenterologist:  Dr. Cephas Romero  Pre-Procedure History & Physical: HPI:  Vanessa Romero is a 45 y.o. female is here for an endoscopy.   Past Medical History:  Diagnosis Date   Alcohol abuse    Tobacco dependence     History reviewed. No pertinent surgical history.  Prior to Admission medications   Medication Sig Start Date End Date Taking? Authorizing Provider  escitalopram (LEXAPRO) 20 MG tablet Take 1 tablet by mouth daily at 12 noon. 05/28/19  Yes [provider]  hydrOXYzine (ATARAX/VISTARIL) 10 MG tablet Take 1 tablet by mouth 3 (three) times daily as needed. 11/17/20 11/27/20 Yes [provider]  lamoTRIgine (LAMICTAL) 25 MG tablet Take 2 tablets by mouth daily. 04/05/20  Yes [provider]    Allergies as of 11/22/2020   (No Known Allergies)    History reviewed. No pertinent family history.  Social History   Socioeconomic History   Marital status: Single    Spouse name: Not on file   Number of children: Not on file   Years of education: Not on file   Highest education level: Not on file  Occupational History   Not on file  Tobacco Use   Smoking status: Former    Types: Cigarettes   Smokeless tobacco: Current  Substance and Sexual Activity   Alcohol use: Yes   Drug use: Not Currently   Sexual activity: Not on file  Other Topics Concern   Not on file  Social History Narrative   Not on file   Social Determinants of Health   Financial Resource Strain: Not on file  Food Insecurity: Not on file  Transportation Needs: Not on file  Physical Activity: Not on file  Stress: Not on file  Social Connections: Not on file  Intimate Partner Violence: Not on file    Review of Systems: See HPI, otherwise negative ROS  Physical  Exam: BP 110/71   Pulse 61   Temp (!) 97 F (36.1 C) (Temporal)   Resp 14   Ht 5\' 7"  (1.702 m)   Wt 54.4 kg   LMP  (Exact Date)   SpO2 99%   BMI 18.79 kg/m  General:   Alert,  pleasant and cooperative in NAD Head:  Normocephalic and atraumatic. Neck:  Supple; no masses or thyromegaly. Lungs:  Clear throughout to auscultation.    Heart:  Regular rate and rhythm. Abdomen:  Soft, nontender and nondistended. Normal bowel sounds, without guarding, and without rebound.   Neurologic:  Alert and  oriented x4;  grossly normal neurologically.  Impression/Plan: Vanessa Romero is here for an endoscopy to be performed for upper abdominal pain, anemia  Risks, benefits, limitations, and alternatives regarding  endoscopy have been reviewed with the patient.  Questions have been answered.  All parties agreeable.   Sherri Sear, MD  11/24/2020, 12:06 PM

## 2020-11-24 NOTE — Progress Notes (Signed)
Patient ID: Vanessa Romero, female   DOB: 10-28-1975, 45 y.o.   MRN: [id number] [facility] PROGRESS NOTE  Vanessa Romero RKY:[id number] DOB: 10/17/1975 DOA: 11/22/2020 PCP: Anselmo Pickler, MD  HPI/Subjective: Patient seen this morning and had some abdominal pain and some nausea.  Still requiring quite a bit of medications for alcohol withdrawal.  Still having some vaginal bleeding.  Responded to blood transfusion.  Initially admitted with alcohol withdrawal.  Patient states that she is having periods of confusion  Objective: Vitals:   11/24/20 1206 11/24/20 1216  BP: 114/76 116/81  Pulse: 61 62  Resp: 13 15  Temp:    SpO2: 98% 97%    Intake/Output Summary (Last 24 hours) at 11/24/2020 1332 Last data filed at 11/24/2020 1156 Gross per 24 hour  Intake 1290 ml  Output 0 ml  Net 1290 ml   Filed Weights   11/22/20 0922  Weight: 54.4 kg    ROS: Review of Systems  Respiratory:  Negative for shortness of breath.   Cardiovascular:  Negative for chest pain.  Gastrointestinal:  Positive for abdominal pain, diarrhea and nausea. Negative for vomiting.  Exam: Physical Exam HENT:     Head: Normocephalic.     Mouth/Throat:     Pharynx: No oropharyngeal exudate.  Eyes:     General: Lids are normal.     Conjunctiva/sclera: Conjunctivae normal.  Cardiovascular:     Rate and Rhythm: Normal rate and regular rhythm.     Heart sounds: Normal heart sounds, S1 normal and S2 normal.  Pulmonary:     Breath sounds: Normal breath sounds. No decreased breath sounds, wheezing, rhonchi or rales.  Abdominal:     General: Bowel sounds are normal.     Tenderness: There is generalized abdominal tenderness.  Musculoskeletal:     Right lower leg: No swelling.     Left lower leg: No swelling.  Skin:    General: Skin is warm.     Findings: No rash.  Neurological:     Mental Status: She is alert and oriented to person, place, and time.      Scheduled Meds:  sodium chloride    Intravenous Once   escitalopram  20 mg Oral Q1200   feeding supplement  1 Container Oral TID BM   folic acid  1 mg Oral Daily   lamoTRIgine  50 mg Oral Daily   lidocaine  1 patch Transdermal Q24H   lipase/protease/amylase  36,000 Units Oral TID WC   multivitamin with minerals  1 tablet Oral Daily   Continuous Infusions:  iron sucrose     thiamine injection 500 mg (11/24/20 1324)    Assessment/Plan:  Alcohol withdrawal.  Still requiring IV Ativan.  With some confusion and will give high-dose thiamine for 2 days.  Gastroenterology started on pancreatic enzymes. Acute blood loss anemia could be secondary to vaginal bleeding.  Upper Endoscopy negative today.  Continue Protonix.  IV iron today Vaginal bleeding.  Case discussed with gynecology and they ordered a pelvic MRI to evaluate the cervix better.  Patient has indistinct appearance of the uterus with indistinct endometrium and multicystic right ovary.  Left ovary not assessed due to the patient discomfort.  The gynecology team may take to the OR for a better exam and biopsy. History of seizure disorder on lamotrigine Depression on Lexapro Hypokalemia replace orally History of LVH        Code Status:     Code Status Orders  (From admission, onward)  Start     Ordered   11/22/20 1303  Full code  Continuous        11/22/20 1306           Code Status History     This patient has a current code status but no historical code status.      Family Communication: Updated patient's mother on the phone Disposition Plan: Status is: Inpatient  Consultants: Gastroenterology Gynecology  Procedures: EGD  Time spent: 28 minutes  [place]

## 2020-11-25 DIAGNOSIS — N888 Other specified noninflammatory disorders of cervix uteri: Secondary | ICD-10-CM

## 2020-11-25 LAB — BASIC METABOLIC PANEL
Anion gap: 5 (ref 5–15)
BUN: 6 mg/dL (ref 6–20)
CO2: 25 mmol/L (ref 22–32)
Calcium: 8.2 mg/dL — ABNORMAL LOW (ref 8.9–10.3)
Chloride: 107 mmol/L (ref 98–111)
Creatinine, Ser: 0.56 mg/dL (ref 0.44–1.00)
GFR, Estimated: >60 mL/min (ref >60)
Glucose, Bld: 88 mg/dL (ref 70–99)
Potassium: 3.3 mmol/L — ABNORMAL LOW (ref 3.5–5.1)
Sodium: 137 mmol/L (ref 135–145)

## 2020-11-25 LAB — CBC
HCT: 31.5 % — ABNORMAL LOW (ref 36.0–46.0)
Hemoglobin: 9.5 g/dL — ABNORMAL LOW (ref 12.0–15.0)
MCH: 20 pg — ABNORMAL LOW (ref 26.0–34.0)
MCHC: 30.2 g/dL (ref 30.0–36.0)
MCV: 66.3 fL — ABNORMAL LOW (ref 80.0–100.0)
Platelets: 132 10*3/uL — ABNORMAL LOW (ref 150–400)
RBC: 4.75 MIL/uL (ref 3.87–5.11)
RDW: 25.8 % — ABNORMAL HIGH (ref 11.5–15.5)
WBC: 5.2 10*3/uL (ref 4.0–10.5)
nRBC: 0 % (ref 0.0–0.2)

## 2020-11-25 LAB — MAGNESIUM: Magnesium: 1.9 mg/dL (ref 1.7–2.4)

## 2020-11-25 LAB — VITAMIN B1: Vitamin B1 (Thiamine): 198.5 nmol/L (ref 66.5–200.0)

## 2020-11-25 LAB — PHOSPHORUS: Phosphorus: 4.1 mg/dL (ref 2.5–4.6)

## 2020-11-25 MED ORDER — ENSURE ENLIVE PO LIQD
237.0000 mL | Freq: Three times a day (TID) | ORAL | Status: DC
Start: 2020-11-25 — End: 2020-11-27
  Administered 2020-11-25 – 2020-11-26 (×2): 237 mL via ORAL

## 2020-11-25 MED ORDER — CYANOCOBALAMIN 1000 MCG/ML IJ SOLN
1000.0000 ug | Freq: Once | INTRAMUSCULAR | Status: DC
  Filled 2020-11-25: qty 1

## 2020-11-25 MED ORDER — VITAMIN B-12 1000 MCG PO TABS
1000.0000 ug | ORAL_TABLET | Freq: Every day | ORAL | Status: DC
  Administered 2020-11-27: 1000 ug via ORAL
  Filled 2020-11-25 (×2): qty 1

## 2020-11-25 MED ORDER — LORAZEPAM 2 MG PO TABS
2.0000 mg | ORAL_TABLET | ORAL | Status: DC | PRN
  Administered 2020-11-25 – 2020-11-27 (×3): 2 mg via ORAL
  Filled 2020-11-25 (×3): qty 1

## 2020-11-25 MED ORDER — LORAZEPAM 2 MG/ML IJ SOLN
2.0000 mg | INTRAMUSCULAR | Status: DC | PRN
  Administered 2020-11-25: 2 mg via INTRAVENOUS
  Filled 2020-11-25: qty 1

## 2020-11-25 MED ORDER — MAGNESIUM SULFATE 2 GM/50ML IV SOLN
2.0000 g | Freq: Once | INTRAVENOUS | Status: AC
  Administered 2020-11-25: 2 g via INTRAVENOUS
  Filled 2020-11-25: qty 50

## 2020-11-25 MED ORDER — SODIUM CHLORIDE 0.9 % IV SOLN
300.0000 mg | Freq: Once | INTRAVENOUS | Status: AC
  Administered 2020-11-26: 300 mg via INTRAVENOUS
  Filled 2020-11-25: qty 300

## 2020-11-25 MED ORDER — MORPHINE SULFATE (PF) 2 MG/ML IV SOLN
2.0000 mg | INTRAVENOUS | Status: DC | PRN
  Administered 2020-11-25 – 2020-11-27 (×6): 2 mg via INTRAVENOUS
  Filled 2020-11-25 (×6): qty 1

## 2020-11-25 MED ORDER — ACETAMINOPHEN 650 MG RE SUPP
650.0000 mg | Freq: Four times a day (QID) | RECTAL | Status: DC | PRN
  Filled 2020-11-25: qty 1

## 2020-11-25 MED ORDER — ACETAMINOPHEN 325 MG PO TABS
650.0000 mg | ORAL_TABLET | Freq: Four times a day (QID) | ORAL | Status: DC | PRN
  Administered 2020-11-25 – 2020-11-27 (×5): 650 mg via ORAL
  Filled 2020-11-25 (×5): qty 2

## 2020-11-25 MED ORDER — DIAZEPAM 5 MG PO TABS
5.0000 mg | ORAL_TABLET | Freq: Four times a day (QID) | ORAL | Status: DC
  Administered 2020-11-25 – 2020-11-26 (×3): 5 mg via ORAL
  Filled 2020-11-25 (×3): qty 1

## 2020-11-25 NOTE — Progress Notes (Signed)
Initial Nutrition Assessment  DOCUMENTATION CODES:   Severe malnutrition in context of chronic illness  INTERVENTION:   Ensure Enlive po TID, each supplement provides 350 kcal and 20 grams of protein  MVI po daily   Regular diet   Pt at high risk for nutrient deficiencies r/t her gastric surgery; will check zinc, copper and vitamins D, A, E, & K labs.   Pt at high refeed risk; recommend monitor potassium, magnesium and phosphorus labs daily until stable  NUTRITION DIAGNOSIS:   Severe Malnutrition related to chronic illness (h/o roux-en-y, etoh abuse, cervical mass, pancreatic insuffiency) as evidenced by 17 percent weight loss in 3 months, severe fat depletion, severe muscle depletion.  GOAL:   Patient will meet greater than or equal to 90% of their needs  MONITOR:   PO intake, Supplement acceptance, Labs, Weight trends, Skin, I & O's  REASON FOR ASSESSMENT:   Consult Assessment of nutrition requirement/status  ASSESSMENT:   45 y/o female with h/o etoh abuse, chronic diarrhea, roux-en-y gatric bypass 2010, LVH,  intracranial hypertension since 2007 status post 2 VP shunt placements last of which was removed in May 2012 secondary to meningitis, chronic headaches, seizures and depression who is admitted with weakness and new cervical mass.  Pt s/p EGD 11/9: pt noted to have very small gastric pouch   Met with pt in room today. Pt emotional and crying at time of RD visit. Pt reports poor appetite and oral intake at baseline; RD suspects poor oral intake is r/t etoh abuse and h/o roux-en-y. Pt reports that her oral intake has been poor for several months. Pt reports that she has been taking a daily MVI but rarely drinks any protein supplements. Pt reports that she did not touch her breakfast tray but reports that she ate 50% of her lunch. Pt reports that her food is being chopped up and that she is unable to eat it when it looks that way. Pt is asking if she can order in [place]  eats; will upgrade to regular diet. RD discussed with pt the importance of adequate nutrition needed to preserve lean muscle. Pt reports current muscle weakness with difficulty standing. Pt also reports increased hair loss. RD discussed with pt about her increased risk for nutrient deficiency r/t her etoh abuse and h/o roux-en-y; will check bariatric vitamin labs. GI following with concerns for pancreatic insufficiency; creon initiated. Per chart, pt with 25lb(17%) over the past 3 months; this is severe weight loss. Pt with new cervical mass with plans for biopsy tomorrow.   Medications reviewed and include: L24, folic acid, creon, MVI, thiamine, iron sucrose  Labs reviewed: K 3.3(L), P 4.1 wnl, Mg 1.9 wnl Iron 187(H), TIBC 325, ferritin 39, folate 39, B12 232- 11/9 Hgb 9.5(L), Hct 31.5(L), MCV 66.3(L), MCH 20.0(L)  NUTRITION - FOCUSED PHYSICAL EXAM:  Flowsheet Row Most Recent Value  Orbital Region Moderate depletion  Upper Arm Region Severe depletion  Thoracic and Lumbar Region Severe depletion  Buccal Region Moderate depletion  Temple Region Moderate depletion  Clavicle Bone Region Moderate depletion  Clavicle and Acromion Bone Region Moderate depletion  Scapular Bone Region Moderate depletion  Dorsal Hand Moderate depletion  Patellar Region Severe depletion  Anterior Thigh Region Severe depletion  Posterior Calf Region Severe depletion  Edema (RD Assessment) None  Hair Reviewed  Eyes Reviewed  Mouth Reviewed  Skin Reviewed  Nails Reviewed   Diet Order:   Diet Order  Diet regular Room service appropriate? Yes; Fluid consistency: Thin  Diet effective now                  EDUCATION NEEDS:   Education needs have been addressed  Skin:  Skin Assessment: Reviewed RN Assessment  Last BM:  1/10- type 5  Height:   Ht Readings from Last 1 Encounters:  11/22/20 '5\' 7"'  (1.702 m)    Weight:   Wt Readings from Last 1 Encounters:  11/22/20 54.4 kg    Ideal  Body Weight:  61.36 kg  BMI:  Body mass index is 18.79 kg/m.  Estimated Nutritional Needs:   Kcal:  1700-1900kcal/day  Protein:  85-95g/day  Fluid:  1.7-1.9L/day  Koleen Distance MS, RD, LDN Please refer to [facility] for RD and/or RD on-call/weekend/after hours pager

## 2020-11-25 NOTE — Plan of Care (Signed)
Patient has gotten pain and antiemic medication around the clock this shift, morphine and oxycodone, and zofran and phenerghan. Scoring between 13-16 on CIWA, Ativan given PRN.   Problem: Health Behavior/Discharge Planning: Goal: Ability to manage health-related needs will improve Outcome: Not Progressing   Problem: Coping: Goal: Level of anxiety will decrease Outcome: Not Progressing   Problem: Pain Managment: Goal: General experience of comfort will improve Outcome: Not Progressing   Problem: Safety: Goal: Ability to remain free from injury will improve Outcome: Progressing

## 2020-11-25 NOTE — Progress Notes (Signed)
Patient ID: Vanessa Romero, female   DOB: 1975/08/23, 45 y.o.   MRN: [id number] [facility] PROGRESS NOTE  Tanika Bracco FVC:[id number] DOB: Mar 09, 1975 DOA: 11/22/2020 PCP: Anselmo Pickler, MD  HPI/Subjective: Patient complains of a headache.  Still requiring quite a bit of medications with the alcohol withdrawal protocol.  Some abdominal pain.  Some nausea.  MRI showing cervical mass.  Patient very emotional after me going over results with her.  Objective: Vitals:   11/25/20 0523 11/25/20 0756  BP: (!) 139/99 (!) 140/100  Pulse: 74 79  Resp:  18  Temp:  97.8 F (36.6 C)  SpO2:  99%    Intake/Output Summary (Last 24 hours) at 11/25/2020 1213 Last data filed at 11/25/2020 1000 Gross per 24 hour  Intake 1124.99 ml  Output --  Net 1124.99 ml   Filed Weights   11/22/20 0922  Weight: 54.4 kg    ROS: Review of Systems  Respiratory:  Negative for shortness of breath.   Cardiovascular:  Negative for chest pain.  Gastrointestinal:  Negative for abdominal pain, nausea and vomiting.  Exam: Physical Exam HENT:     Head: Normocephalic.     Mouth/Throat:     Pharynx: No oropharyngeal exudate.  Eyes:     General: Lids are normal.     Conjunctiva/sclera: Conjunctivae normal.  Cardiovascular:     Rate and Rhythm: Normal rate and regular rhythm.     Heart sounds: Normal heart sounds, S1 normal and S2 normal.  Pulmonary:     Breath sounds: No decreased breath sounds, wheezing, rhonchi or rales.  Abdominal:     Palpations: Abdomen is soft.     Tenderness: There is abdominal tenderness in the epigastric area.  Musculoskeletal:     Right lower leg: No swelling.     Left lower leg: No swelling.  Skin:    General: Skin is warm.     Findings: No rash.  Neurological:     Mental Status: She is alert and oriented to person, place, and time.     Comments: Slight tremor.      Scheduled Meds:  cyanocobalamin  1,000 mcg Intramuscular Once   diazepam  5 mg Oral Q6H    escitalopram  20 mg Oral Q1200   feeding supplement  1 Container Oral TID BM   folic acid  1 mg Oral Daily   lamoTRIgine  50 mg Oral Daily   lipase/protease/amylase  36,000 Units Oral TID WC   multivitamin with minerals  1 tablet Oral Daily   [START ON 11/26/2020] vitamin B-12  1,000 mcg Oral Daily   Continuous Infusions:  [START ON 11/26/2020] iron sucrose     promethazine (PHENERGAN) injection (IM or IVPB) Stopped (11/25/20 0257)   thiamine injection Stopped (11/25/20 0549)    Assessment/Plan:  Alcohol withdrawal.  Still requiring alcohol withdrawal protocol.  We will add standing dose of Valium to lessen IV requirements.  Patient on high-dose thiamine for 2 days. Cervical mass with vaginal bleeding.  Gynecology to do a procedure tomorrow for biopsy and diagnosis. Acute blood loss anemia likely secondary to vaginal bleeding.  EGD negative.  Patient on Protonix.  Received IV iron yesterday. History of seizure on lamotrigine Headache.  We will give IV magnesium today Depression on Lexapro Hypokalemia replace orally History of LVH Thrombocytopenia likely secondary to alcohol.        Code Status:     Code Status Orders  (From admission, onward)  Start     Ordered   11/22/20 1303  Full code  Continuous        11/22/20 1306           Code Status History     This patient has a current code status but no historical code status.      Family Communication: Updated patient's mother on the phone Disposition Plan: Status is: Inpatient  Consultants: Gynecology Gastroenterology  Procedures: EGD  Time spent: 28 minutes  Mattalynn Crandle [employer]

## 2020-11-25 NOTE — Consult Note (Signed)
Obstetric and Gynecology  Subjective  Patient eating breakfast.    Objective  Vital signs in last 24 hours: Temp:  [97 F (36.1 C)-98.4 F (36.9 C)] 97.8 F (36.6 C) (11/10 0756) Pulse Rate:  [61-91] 79 (11/10 0756) Resp:  [13-20] 18 (11/10 0756) BP: (110-140)/(71-100) 140/100 (11/10 0756) SpO2:  [94 %-100 %] 99 % (11/10 0756) Last BM Date: 11/25/20   Intake/Output Summary (Last 24 hours) at 11/25/2020 1006 Last data filed at 11/25/2020 0900 Gross per 24 hour  Intake 1415.88 ml  Output 0 ml  Net 1415.88 ml    General: NAD Pulmonary: no increased work of breathing Abdomen: soft, non-tender Extremities: no edema  Labs: Results for orders placed or performed during the hospital encounter of 11/22/20 (from the past 24 hour(s))  Vitamin B12     Status: None   Collection Time: 11/24/20  1:10 PM  Result Value Ref Range   Vitamin B-12 232 180 - 914 pg/mL  Magnesium     Status: None   Collection Time: 11/25/20  5:58 AM  Result Value Ref Range   Magnesium 1.9 1.7 - 2.4 mg/dL  Phosphorus     Status: None   Collection Time: 11/25/20  5:58 AM  Result Value Ref Range   Phosphorus 4.1 2.5 - 4.6 mg/dL  Basic metabolic panel     Status: Abnormal   Collection Time: 11/25/20  5:58 AM  Result Value Ref Range   Sodium 137 135 - 145 mmol/L   Potassium 3.3 (L) 3.5 - 5.1 mmol/L   Chloride 107 98 - 111 mmol/L   CO2 25 22 - 32 mmol/L   Glucose, Bld 88 70 - 99 mg/dL   BUN 6 6 - 20 mg/dL   Creatinine, Ser 0.56 0.44 - 1.00 mg/dL   Calcium 8.2 (L) 8.9 - 10.3 mg/dL   GFR, Estimated >60 >60 mL/min   Anion gap 5 5 - 15  CBC     Status: Abnormal   Collection Time: 11/25/20  5:58 AM  Result Value Ref Range   WBC 5.2 4.0 - 10.5 K/uL   RBC 4.75 3.87 - 5.11 MIL/uL   Hemoglobin 9.5 (L) 12.0 - 15.0 g/dL   HCT 31.5 (L) 36.0 - 46.0 %   MCV 66.3 (L) 80.0 - 100.0 fL   MCH 20.0 (L) 26.0 - 34.0 pg   MCHC 30.2 30.0 - 36.0 g/dL   RDW 25.8 (H) 11.5 - 15.5 %   Platelets 132 (L) 150 - 400 K/uL    nRBC 0.0 0.0 - 0.2 %    Cultures: Results for orders placed or performed during the hospital encounter of 11/22/20  Resp Panel by RT-PCR (Flu A&B, Covid)     Status: None   Collection Time: 11/22/20  1:24 PM   Specimen: Nasopharyngeal(NP) swabs in vial transport medium  Result Value Ref Range Status   SARS Coronavirus 2 by RT PCR NEGATIVE NEGATIVE Final    Comment: (NOTE) SARS-CoV-2 target nucleic acids are NOT DETECTED.  The SARS-CoV-2 RNA is generally detectable in upper respiratory specimens during the acute phase of infection. The lowest concentration of SARS-CoV-2 viral copies this assay can detect is 138 copies/mL. A negative result does not preclude SARS-Cov-2 infection and should not be used as the sole basis for treatment or other patient management decisions. A negative result may occur with  improper specimen collection/handling, submission of specimen other than nasopharyngeal swab, presence of viral mutation(s) within the areas targeted by this assay, and inadequate number of  viral copies(<138 copies/mL). A negative result must be combined with clinical observations, patient history, and epidemiological information. The expected result is Negative.  Fact Sheet for Patients:  EntrepreneurPulse.com.au  Fact Sheet for Healthcare Providers:  IncredibleEmployment.be  This test is no t yet approved or cleared by the [place] FDA and  has been authorized for detection and/or diagnosis of SARS-CoV-2 by FDA under an Emergency Use Authorization (EUA). This EUA will remain  in effect (meaning this test can be used) for the duration of the COVID-19 declaration under Section 564(b)(1) of the Act, 21 U.S.C.section 360bbb-3(b)(1), unless the authorization is terminated  or revoked sooner.       Influenza A by PCR NEGATIVE NEGATIVE Final   Influenza B by PCR NEGATIVE NEGATIVE Final    Comment: (NOTE) The Xpert Xpress SARS-CoV-2/FLU/RSV  plus assay is intended as an aid in the diagnosis of influenza from Nasopharyngeal swab specimens and should not be used as a sole basis for treatment. Nasal washings and aspirates are unacceptable for Xpert Xpress SARS-CoV-2/FLU/RSV testing.  Fact Sheet for Patients: EntrepreneurPulse.com.au  Fact Sheet for Healthcare Providers: IncredibleEmployment.be  This test is not yet approved or cleared by the [place] FDA and has been authorized for detection and/or diagnosis of SARS-CoV-2 by FDA under an Emergency Use Authorization (EUA). This EUA will remain in effect (meaning this test can be used) for the duration of the COVID-19 declaration under Section 564(b)(1) of the Act, 21 U.S.C. section 360bbb-3(b)(1), unless the authorization is terminated or revoked.  Performed at [facility], [address], [place], [place] [address]     Imaging:  Assessment   45 y.o. admitted for ETOH withdrawal, anemia status post prior roux-en-y, normal endoscopy yesterday, with cervical mass  Plan   1) Cervical mass - high suspicion for neoplasm given imaging findings although cervical fibroid is also in the differential.  Will need to obtain tissue diagnosis.  The mass is intrinsic to the cervix so unlikely to obtain tissue diagnosis with ectocervical biopsy.  Will plan for EUA with biopsies at that time tomorrow.  I discussed imaging findings and need/process for obtaining cervical biopsies with the patient today.   - NPO after midnight - EUA at 09:30 on 11/26/2020

## 2020-11-25 NOTE — TOC CM/SW Note (Signed)
CSW acknowledges consult for SA resources. Per notes from 11/6, patient is not interested in resources. Please consult again if patient is agreeable and CSW will provide.  [name], Vanessa Romero

## 2020-11-26 ENCOUNTER — Encounter: Payer: Self-pay | Admitting: Internal Medicine

## 2020-11-26 ENCOUNTER — Encounter
Admission: EM | Disposition: A | Discharge status: Home / Self Care | Payer: Self-pay | Source: Home / Self Care | Attending: Internal Medicine

## 2020-11-26 ENCOUNTER — Inpatient Hospital Stay: Payer: Medicare Other | Admitting: Anesthesiology

## 2020-11-26 DIAGNOSIS — E43 Unspecified severe protein-calorie malnutrition: Secondary | ICD-10-CM | POA: Insufficient documentation

## 2020-11-26 DIAGNOSIS — C539 Malignant neoplasm of cervix uteri, unspecified: Principal | ICD-10-CM

## 2020-11-26 HISTORY — PX: CERVICAL CONIZATION W/BX: SHX1330

## 2020-11-26 LAB — CBC
HCT: 30.5 % — ABNORMAL LOW (ref 36.0–46.0)
Hemoglobin: 9.2 g/dL — ABNORMAL LOW (ref 12.0–15.0)
MCH: 20.4 pg — ABNORMAL LOW (ref 26.0–34.0)
MCHC: 30.2 g/dL (ref 30.0–36.0)
MCV: 67.5 fL — ABNORMAL LOW (ref 80.0–100.0)
Platelets: 118 10*3/uL — ABNORMAL LOW (ref 150–400)
RBC: 4.52 MIL/uL (ref 3.87–5.11)
RDW: 26.1 % — ABNORMAL HIGH (ref 11.5–15.5)
WBC: 5.2 10*3/uL (ref 4.0–10.5)
nRBC: 0 % (ref 0.0–0.2)

## 2020-11-26 LAB — POTASSIUM: Potassium: 3.9 mmol/L (ref 3.5–5.1)

## 2020-11-26 LAB — COPPER, SERUM: Copper: 84 ug/dL (ref 80–158)

## 2020-11-26 LAB — VITAMIN D 25 HYDROXY (VIT D DEFICIENCY, FRACTURES): Vit D, 25-Hydroxy: 17 ng/mL — ABNORMAL LOW (ref 30–100)

## 2020-11-26 SURGERY — CONE BIOPSY, CERVIX
Anesthesia: General | Site: Cervix

## 2020-11-26 MED ORDER — OXYCODONE HCL 5 MG PO TABS: 5.0000 mg | ORAL_TABLET | Freq: Once | ORAL | Status: DC

## 2020-11-26 MED ORDER — LIDOCAINE HCL (PF) 2 % IJ SOLN
INTRAMUSCULAR | Status: AC
  Filled 2020-11-26: qty 5

## 2020-11-26 MED ORDER — PROMETHAZINE HCL 25 MG/ML IJ SOLN: 6.2500 mg | INTRAMUSCULAR | Status: DC | PRN

## 2020-11-26 MED ORDER — OXYCODONE HCL 5 MG PO TABS
ORAL_TABLET | ORAL | Status: AC
  Filled 2020-11-26: qty 1

## 2020-11-26 MED ORDER — DIAZEPAM 2 MG PO TABS
2.0000 mg | ORAL_TABLET | Freq: Four times a day (QID) | ORAL | Status: DC
  Administered 2020-11-26 – 2020-11-27 (×5): 2 mg via ORAL
  Filled 2020-11-26 (×5): qty 1

## 2020-11-26 MED ORDER — IODINE STRONG (LUGOLS) 5 % PO SOLN
ORAL | Status: AC
  Filled 2020-11-26: qty 1

## 2020-11-26 MED ORDER — ONDANSETRON HCL 4 MG/2ML IJ SOLN
INTRAMUSCULAR | Status: AC
  Filled 2020-11-26: qty 2

## 2020-11-26 MED ORDER — PROMETHAZINE HCL 25 MG/ML IJ SOLN: 12.5000 mg | Freq: Once | INTRAMUSCULAR | Status: DC | PRN

## 2020-11-26 MED ORDER — SODIUM CHLORIDE FLUSH 0.9 % IV SOLN
INTRAVENOUS | Status: AC
  Filled 2020-11-26: qty 10

## 2020-11-26 MED ORDER — PROPOFOL 10 MG/ML IV BOLUS
INTRAVENOUS | Status: AC
  Filled 2020-11-26: qty 20

## 2020-11-26 MED ORDER — MIDAZOLAM HCL 2 MG/2ML IJ SOLN
INTRAMUSCULAR | Status: DC | PRN
  Administered 2020-11-26: 2 mg via INTRAVENOUS

## 2020-11-26 MED ORDER — FENTANYL CITRATE (PF) 100 MCG/2ML IJ SOLN
INTRAMUSCULAR | Status: AC
  Administered 2020-11-26: 25 ug via INTRAVENOUS
  Filled 2020-11-26: qty 2

## 2020-11-26 MED ORDER — DEXAMETHASONE SODIUM PHOSPHATE 10 MG/ML IJ SOLN
INTRAMUSCULAR | Status: AC
  Filled 2020-11-26: qty 1

## 2020-11-26 MED ORDER — MIDAZOLAM HCL 2 MG/2ML IJ SOLN
INTRAMUSCULAR | Status: AC
  Filled 2020-11-26: qty 2

## 2020-11-26 MED ORDER — VITAMIN D 25 MCG (1000 UNIT) PO TABS
1000.0000 [IU] | ORAL_TABLET | Freq: Every day | ORAL | Status: DC
  Administered 2020-11-27: 1000 [IU] via ORAL
  Filled 2020-11-26: qty 1

## 2020-11-26 MED ORDER — ACETAMINOPHEN 325 MG PO TABS: 325.0000 mg | ORAL_TABLET | ORAL | Status: DC | PRN

## 2020-11-26 MED ORDER — VASOPRESSIN 20 UNIT/ML IV SOLN
INTRAVENOUS | Status: AC
  Filled 2020-11-26: qty 1

## 2020-11-26 MED ORDER — THIAMINE HCL 100 MG PO TABS
100.0000 mg | ORAL_TABLET | Freq: Every day | ORAL | Status: DC
  Administered 2020-11-27: 100 mg via ORAL
  Filled 2020-11-26: qty 1

## 2020-11-26 MED ORDER — PROPOFOL 500 MG/50ML IV EMUL
INTRAVENOUS | Status: AC
  Filled 2020-11-26: qty 50

## 2020-11-26 MED ORDER — ROCURONIUM BROMIDE 10 MG/ML (PF) SYRINGE
PREFILLED_SYRINGE | INTRAVENOUS | Status: AC
  Filled 2020-11-26: qty 10

## 2020-11-26 MED ORDER — ONDANSETRON HCL 4 MG/2ML IJ SOLN
INTRAMUSCULAR | Status: DC | PRN
  Administered 2020-11-26: 4 mg via INTRAVENOUS

## 2020-11-26 MED ORDER — LIDOCAINE-EPINEPHRINE 1 %-1:100000 IJ SOLN
INTRAMUSCULAR | Status: AC
  Filled 2020-11-26: qty 1

## 2020-11-26 MED ORDER — PROPOFOL 10 MG/ML IV BOLUS
INTRAVENOUS | Status: DC | PRN
  Administered 2020-11-26: 150 mg via INTRAVENOUS
  Administered 2020-11-26: 50 mg via INTRAVENOUS

## 2020-11-26 MED ORDER — GLYCOPYRROLATE 0.2 MG/ML IJ SOLN
INTRAMUSCULAR | Status: AC
  Filled 2020-11-26: qty 1

## 2020-11-26 MED ORDER — LIDOCAINE HCL (CARDIAC) PF 100 MG/5ML IV SOSY
PREFILLED_SYRINGE | INTRAVENOUS | Status: DC | PRN
  Administered 2020-11-26: 60 mg via INTRAVENOUS

## 2020-11-26 MED ORDER — ACETAMINOPHEN 160 MG/5ML PO SOLN
325.0000 mg | ORAL | Status: DC | PRN
  Filled 2020-11-26: qty 20.3

## 2020-11-26 MED ORDER — ACETAMINOPHEN 10 MG/ML IV SOLN: 1000.0000 mg | Freq: Once | INTRAVENOUS | Status: DC | PRN

## 2020-11-26 MED ORDER — 0.9 % SODIUM CHLORIDE (POUR BTL) OPTIME
TOPICAL | Status: DC | PRN
  Administered 2020-11-26: 500 mL

## 2020-11-26 MED ORDER — LACTATED RINGERS IV SOLN: INTRAVENOUS | Status: DC | PRN

## 2020-11-26 MED ORDER — FENTANYL CITRATE (PF) 100 MCG/2ML IJ SOLN
INTRAMUSCULAR | Status: AC
  Filled 2020-11-26: qty 2

## 2020-11-26 MED ORDER — DEXAMETHASONE SODIUM PHOSPHATE 10 MG/ML IJ SOLN
INTRAMUSCULAR | Status: DC | PRN
  Administered 2020-11-26: 5 mg via INTRAVENOUS

## 2020-11-26 MED ORDER — FENTANYL CITRATE (PF) 100 MCG/2ML IJ SOLN
25.0000 ug | INTRAMUSCULAR | Status: AC | PRN
  Administered 2020-11-26 (×6): 25 ug via INTRAVENOUS

## 2020-11-26 MED ORDER — FENTANYL CITRATE (PF) 100 MCG/2ML IJ SOLN
INTRAMUSCULAR | Status: DC | PRN
  Administered 2020-11-26: 50 ug via INTRAVENOUS

## 2020-11-26 MED ORDER — LACTATED RINGERS IV SOLN: INTRAVENOUS | Status: DC

## 2020-11-26 MED ORDER — PROMETHAZINE HCL 25 MG/ML IJ SOLN
INTRAMUSCULAR | Status: AC
  Administered 2020-11-26: 6.25 mg via INTRAVENOUS
  Filled 2020-11-26: qty 1

## 2020-11-26 SURGICAL SUPPLY — 38 items
APPLICATOR COTTON TIP 6 STRL (MISCELLANEOUS) ×18 IMPLANT
APPLICATOR COTTON TIP 6IN STRL (MISCELLANEOUS) ×18 IMPLANT
BLADE SURG SZ11 CARB STEEL (BLADE) ×4 IMPLANT
CATH ROBINSON RED A/P 16FR (CATHETERS) ×4 IMPLANT
DRAPE PERI LITHO V/GYN (MISCELLANEOUS) ×4 IMPLANT
DRAPE UNDER BUTTOCK W/FLU (DRAPES) ×4 IMPLANT
DRSG TELFA 3X8 NADH (GAUZE/BANDAGES/DRESSINGS) ×3 IMPLANT
ELECT LEEP BALL 5MM 12CM (MISCELLANEOUS) ×3
ELECT REM PT RETURN 9FT ADLT (ELECTROSURGICAL) ×3
ELECTRODE LEEP BALL 5MM 12CM (MISCELLANEOUS) ×3 IMPLANT
ELECTRODE REM PT RTRN 9FT ADLT (ELECTROSURGICAL) ×3 IMPLANT
GAUZE 4X4 16PLY ~~LOC~~+RFID DBL (SPONGE) ×8 IMPLANT
GLOVE SURG ENC MOIS LTX SZ7 (GLOVE) ×4 IMPLANT
GLOVE SURG UNDER LTX SZ7.5 (GLOVE) ×4 IMPLANT
GOWN STRL REUS W/ TWL LRG LVL3 (GOWN DISPOSABLE) ×6 IMPLANT
GOWN STRL REUS W/TWL LRG LVL3 (GOWN DISPOSABLE) ×2
HEMOSTAT SURGICEL 2X3 (HEMOSTASIS) ×2 IMPLANT
KIT TURNOVER CYSTO (KITS) ×4 IMPLANT
LABEL OR SOLS (LABEL) ×2 IMPLANT
MANIFOLD NEPTUNE II (INSTRUMENTS) ×4 IMPLANT
NDL BIOPSY 14X6 SOFT TISS (NEEDLE) IMPLANT
NDL HYPO 25GX1 SAFETY (NEEDLE) ×2 IMPLANT
NDL SPNL 22GX3.5 QUINCKE BK (NEEDLE) ×2 IMPLANT
NEEDLE BIOPSY 14X6 SOFT TISS (NEEDLE) ×3 IMPLANT
NEEDLE HYPO 25GX1 SAFETY (NEEDLE) ×3 IMPLANT
NEEDLE SPNL 22GX3.5 QUINCKE BK (NEEDLE) ×3 IMPLANT
PACK BASIN MINOR ARMC (MISCELLANEOUS) ×4 IMPLANT
PACK DNC HYST (MISCELLANEOUS) ×4 IMPLANT
PAD DRESSING TELFA 3X8 NADH (GAUZE/BANDAGES/DRESSINGS) ×2 IMPLANT
PAD OB MATERNITY 4.3X12.25 (PERSONAL CARE ITEMS) ×4 IMPLANT
PAD PREP 24X41 OB/GYN DISP (PERSONAL CARE ITEMS) ×4 IMPLANT
SCRUB EXIDINE 4% CHG 4OZ (MISCELLANEOUS) ×2 IMPLANT
SOL PREP PROV IODINE SCRUB 4OZ (MISCELLANEOUS) ×2 IMPLANT
SUT VIC AB 0 CT1 36 (SUTURE) ×2 IMPLANT
SYR 10ML LL (SYRINGE) ×4 IMPLANT
SYR CONTROL 10ML LL (SYRINGE) ×4 IMPLANT
TOWEL OR 17X26 4PK STRL BLUE (TOWEL DISPOSABLE) ×4 IMPLANT
WATER STERILE IRR 500ML POUR (IV SOLUTION) ×4 IMPLANT

## 2020-11-26 NOTE — Consult Note (Signed)
Patient underwent uncomplicated cervical biopsies an exam under anesthesia.  From a gynecologic standpoint she is stable for discharge, should she remain in house for her EtOH withdrawal we will continue to follow.  If confirmation of cervical cancer on biopsies will arrange for Gynecology-Oncology follow up Wednesday 12/01/2020.  Malachy Mood, MD, [facility], [facility] 11/26/2020, 9:45 AM

## 2020-11-26 NOTE — Progress Notes (Addendum)
Patient ID: Carilyn Woolston, female   DOB: Aug 11, 1975, 45 y.o.   MRN: [id number] [facility] PROGRESS NOTE  Toneka Fullen HTD:[id number] DOB: 1975-10-16 DOA: 11/22/2020 PCP: Anselmo Pickler, MD  HPI/Subjective: Patient seen before and after procedure.  Had some lower abdominal pain and nausea after the procedure.  Came in initially with alcohol withdrawal and found to have vaginal bleeding.  Objective: Vitals:   11/26/20 1056 11/26/20 1109  BP: (!) 142/91 (!) 147/94  Pulse: 70 77  Resp: 16 18  Temp: 97.6 F (36.4 C)   SpO2: 99% 100%    Intake/Output Summary (Last 24 hours) at 11/26/2020 1241 Last data filed at 11/26/2020 1054 Gross per 24 hour  Intake 939.93 ml  Output 1 ml  Net 938.93 ml   Filed Weights   11/22/20 0922 11/26/20 0820  Weight: 54.4 kg 54.4 kg    ROS: Review of Systems  Respiratory:  Negative for shortness of breath.   Cardiovascular:  Negative for chest pain.  Gastrointestinal:  Positive for abdominal pain and nausea.  Exam: Physical Exam HENT:     Head: Normocephalic.     Mouth/Throat:     Pharynx: No oropharyngeal exudate.  Eyes:     General: Lids are normal.     Conjunctiva/sclera: Conjunctivae normal.  Cardiovascular:     Rate and Rhythm: Normal rate and regular rhythm.     Heart sounds: Normal heart sounds, S1 normal and S2 normal.  Pulmonary:     Breath sounds: Normal breath sounds. No decreased breath sounds, wheezing, rhonchi or rales.  Abdominal:     Palpations: Abdomen is soft.     Tenderness: There is no abdominal tenderness.  Musculoskeletal:     Right lower leg: No swelling.     Left lower leg: No swelling.  Skin:    General: Skin is warm.     Findings: No rash.  Neurological:     Mental Status: She is alert and oriented to person, place, and time.      Scheduled Meds:  cholecalciferol  1,000 Units Oral Daily   cyanocobalamin  1,000 mcg Intramuscular Once   diazepam  2 mg Oral Q6H   escitalopram  20 mg  Oral Q1200   feeding supplement  237 mL Oral TID BM   folic acid  1 mg Oral Daily   lamoTRIgine  50 mg Oral Daily   lipase/protease/amylase  36,000 Units Oral TID WC   multivitamin with minerals  1 tablet Oral Daily   sodium chloride flush       sodium chloride flush       thiamine  100 mg Oral Daily   vitamin B-12  1,000 mcg Oral Daily   Continuous Infusions:  iron sucrose 300 mg (11/26/20 1212)   promethazine (PHENERGAN) injection (IM or IVPB) Stopped (11/25/20 0257)    Assessment/Plan:  Alcohol withdrawal.  Doing better since adding standing dose of Valium.  We will taper down to 2 mg every 6 hours.  Patient received high-dose thiamine for 2 days.  Hopefully will be ready for discharge tomorrow with Valium taper. Cervical mass with vaginal bleeding.  Dr. Georgianne Fick took to the operating room for biopsies.  Depending on biopsy results may need Gyn-Onc as outpatient. Acute blood loss anemia secondary to vaginal bleeding.  EGD during the hospital course was negative.  We will give a second dose of IV iron today.  Last hemoglobin 9.2. Seizure disorder on lamotrigine Headache.  Received IV magnesium yesterday Depression on Lexapro Hypokalemia replaced  orally Vitamin B12 deficiency given a dose of IM B12 yesterday Vitamin D deficiency we will give oral vitamin D Thrombocytopenia secondary to alcohol LVH Severe protein calorie malnutrition        Code Status:     Code Status Orders  (From admission, onward)           Start     Ordered   11/22/20 1303  Full code  Continuous        11/22/20 1306           Code Status History     This patient has a current code status but no historical code status.      Family Communication: Updated mother on the phone Disposition Plan: Status is: Inpatient.  Patient's mother will come into town either tonight or tomorrow morning and take the patient back home with her.  She will be able to take her back here for  appointments.  Consultants: Gynecology Gastroenterology  Procedures: EGD Cervical biopsy  Time spent: 29 minutes, seen this morning and again this afternoon.  [place]  [facility] [employer]

## 2020-11-26 NOTE — Anesthesia Preprocedure Evaluation (Addendum)
Anesthesia Evaluation  Patient identified by MRN, date of birth, ID band Patient awake    Reviewed: Allergy & Precautions, H&P , NPO status , Patient's Chart, lab work & pertinent test results  History of Anesthesia Complications Negative for: history of anesthetic complications  Airway Mallampati: II  TM Distance: >3 FB Neck ROM: full    Dental  (+) Poor Dentition   Pulmonary neg sleep apnea, neg COPD, former smoker,    Pulmonary exam normal breath sounds clear to auscultation       Cardiovascular (-) angina(-) Past MI and (-) Cardiac Stents negative cardio ROS Normal cardiovascular exam(-) dysrhythmias  Rhythm:regular Rate:Normal     Neuro/Psych Seizures -,  PSYCHIATRIC DISORDERS Depression    GI/Hepatic negative GI ROS, (+)     substance abuse  alcohol use,   Endo/Other  negative endocrine ROS  Renal/GU      Musculoskeletal   Abdominal   Peds  Hematology  (+) Blood dyscrasia (Hgb 9.2 g/dL), anemia ,   Anesthesia Other Findings Past Medical History: No date: Alcohol abuse No date: Tobacco dependence  Past Surgical History: 11/24/2020: ESOPHAGOGASTRODUODENOSCOPY; N/A     Comment:  Procedure: ESOPHAGOGASTRODUODENOSCOPY (EGD);  Surgeon:               Lin Landsman, MD;  Location: [facility] ENDOSCOPY;                Service: Gastroenterology;  Laterality: N/A;  BMI    Body Mass Index: 18.79 kg/m      Reproductive/Obstetrics negative OB ROS                            Anesthesia Physical Anesthesia Plan  ASA: 2  Anesthesia Plan: General LMA   Post-op Pain Management:    Induction:   PONV Risk Score and Plan: Propofol infusion and TIVA  Airway Management Planned: Simple Face Mask  Additional Equipment:   Intra-op Plan:   Post-operative Plan:   Informed Consent: I have reviewed the patients History and Physical, chart, labs and discussed the procedure including the  risks, benefits and alternatives for the proposed anesthesia with the patient or authorized representative who has indicated his/her understanding and acceptance.     Dental Advisory Given  Plan Discussed with: Anesthesiologist, CRNA and Surgeon  Anesthesia Plan Comments:        Anesthesia Quick Evaluation

## 2020-11-26 NOTE — Anesthesia Postprocedure Evaluation (Signed)
Anesthesia Post Note  Patient: Vanessa Romero  Procedure(s) Performed: CONIZATION CERVIX WITH BIOPSY (Cervix) EXAM UNDER ANESTHESIA (Vagina )  Patient location during evaluation: PACU Anesthesia Type: General Level of consciousness: awake and alert Pain management: pain level controlled Vital Signs Assessment: post-procedure vital signs reviewed and stable Respiratory status: spontaneous breathing, nonlabored ventilation, respiratory function stable and patient connected to nasal cannula oxygen Cardiovascular status: blood pressure returned to baseline and stable Postop Assessment: no apparent nausea or vomiting Anesthetic complications: no   No notable events documented.   Last Vitals:  Vitals:   11/26/20 1056 11/26/20 1109  BP: (!) 142/91 (!) 147/94  Pulse: 70 77  Resp: 16 18  Temp: 36.4 C   SpO2: 99% 100%    Last Pain:  Vitals:   11/26/20 1432  TempSrc:   PainSc: [place]

## 2020-11-26 NOTE — Transfer of Care (Signed)
Immediate Anesthesia Transfer of Care Note  Patient: Vanessa Romero  Procedure(s) Performed: CONIZATION CERVIX WITH BIOPSY (Cervix) EXAM UNDER ANESTHESIA (Vagina )  Patient Location: PACU  Anesthesia Type:General  Level of Consciousness: awake  Airway & Oxygen Therapy: Patient Spontanous Breathing  Post-op Assessment: Report given to RN  Post vital signs: stable  Last Vitals:  Vitals Value Taken Time  BP 138/98 11/26/20 0945  Temp 36.2 C 11/26/20 0943  Pulse 72 11/26/20 0947  Resp 13 11/26/20 0947  SpO2 98 % 11/26/20 0947  Vitals shown include unvalidated device data.  Last Pain:  Vitals:   11/26/20 0820  TempSrc: Oral  PainSc: 8       Patients Stated Pain Goal: 0 (99/69/24 9324)  Complications: No notable events documented.

## 2020-11-26 NOTE — Op Note (Signed)
Preoperative Diagnosis: 1) 45 y.o. with cervical mass noted on MRI  Postoperative Diagnosis: 1) 45 y.o. with cervical mass noted on MRI  Operation Performed: Exam under anesthesia and cervical biopsies  Indication: 45 y.o. with vaginal bleeding and cervical mass noted on work up  Surgeon: Malachy Mood, MD  Anesthesia: General  Preoperative Antibiotics: none  Estimated Blood Loss: 56mL  IV Fluids: 231mL  Urine Output:: 61mL  Drains or Tubes: none  Implants: none  Specimens Removed:  1) Ectocervical biopsies 2) ECC 3) Truecut Core Biopsy  Complications: none  Intraoperative Findings: On bimanual exam the uterus is retroflexed/retroverted, the cervix is barrel shaped, no parametrial extension is noted on exam.  Rectal exam uterine fundus appreciated based on retroflexion, no rectal extension from the cervix noted.   The uterus itself does not feel enlarged.    Visually the external cervix also appears grossly enlarged, there is some friable tissue noted just within the cervical os, with the external os slightly dilated.  The external surface of the cervix appears normal.  There is mass effect noted about 2-3cm into the cervical os on sounding that prevented full sounding of the uterus.    Patient Condition: stable  Procedure in Detail:  Patient was taken to the operating room where she was administered general anesthesia.  She was positioned in the dorsal lithotomy position utilizing Allen stirups, prepped and draped in the usual sterile fashion.  Prior to proceeding with procedure a time out was performed.  Attention was turned to the patient's pelvis.  A red rubber catheter was used to empty the patient's bladder.  An operative speculum was placed to allow visualization of the cervix.  The anterior lip of the cervix was grasped with a single tooth tenaculum, and a Hulka tenaculum was placed to allow manipulation of the uterus.  Using a uterine sound resistance was noted  about 2-3cm into the the external cervical os.  Given cervical dilation a serrated curette was able to be advanced into the cervix yielding a good endocervical sample at the level of the mass yielding good tissue.  A random 4 quadrant ectocervical biopsy was obtained using a Tischler biopsy forceps.  The cervical stroma was noted to be somewhat friable yielding good tissue.  Given that on imaging the mass was noted to be intracervical but encompassing the cervical canal a true cut biopsy was also obtained yielding a good core.  Cervix was re-inspected with minimal bleeding at the biopsy sites noted.  Sponge needle and instrument counts were correct time two.  The patient tolerated the procedure well and was taken to the recovery room in stable condition.

## 2020-11-26 NOTE — Interval H&P Note (Signed)
History and Physical Interval Note:  11/26/2020 8:10 AM  Vanessa Romero  has presented today for surgery, with the diagnosis of Abnormal Cervix Tissue.  The various methods of treatment have been discussed with the patient and family. After consideration of risks, benefits and other options for treatment, the patient has consented to  Procedure(s): CONIZATION CERVIX WITH BIOPSY (N/A) EXAM UNDER ANESTHESIA (N/A) as a surgical intervention.  The patient's history has been reviewed, patient examined, no change in status, stable for surgery.  I have reviewed the patient's chart and labs.  Questions were answered to the patient's satisfaction.    Images reviewed mass encircles the endocervical canal, start approximately 1-1.2cm from the external cervical os, uterus retroflexed.  Malachy Mood

## 2020-11-26 NOTE — Anesthesia Procedure Notes (Signed)
Procedure Name: LMA Insertion Date/Time: 11/26/2020 9:09 AM Performed by: Zetta Bills, CRNA Pre-anesthesia Checklist: Patient identified, Emergency Drugs available, Suction available and Patient being monitored Patient Re-evaluated:Patient Re-evaluated prior to induction Oxygen Delivery Method: Circle system utilized Preoxygenation: Pre-oxygenation with 100% oxygen Induction Type: IV induction LMA: LMA inserted LMA Size: 3.5 Number of attempts: 1 Tube secured with: Tape Dental Injury: Teeth and Oropharynx as per pre-operative assessment

## 2020-11-27 ENCOUNTER — Encounter: Payer: Self-pay | Admitting: Obstetrics and Gynecology

## 2020-11-27 MED ORDER — CYANOCOBALAMIN 1000 MCG PO TABS: 1000.0000 ug | ORAL_TABLET | Freq: Every day | ORAL | 0 refills | Status: DC

## 2020-11-27 MED ORDER — DIAZEPAM 2 MG PO TABS: ORAL_TABLET | ORAL | 0 refills | Status: DC

## 2020-11-27 MED ORDER — VITAMIN D3 25 MCG PO TABS: 1000.0000 [IU] | ORAL_TABLET | Freq: Every day | ORAL | 1 refills | Status: DC

## 2020-11-27 MED ORDER — LAMOTRIGINE 25 MG PO TABS: 50.0000 mg | ORAL_TABLET | Freq: Every day | ORAL | 1 refills | Status: DC

## 2020-11-27 MED ORDER — HYDROXYZINE HCL 10 MG PO TABS: 10.0000 mg | ORAL_TABLET | Freq: Three times a day (TID) | ORAL | 0 refills | Status: AC | PRN

## 2020-11-27 MED ORDER — PANCRELIPASE (LIP-PROT-AMYL) 36000-114000 UNITS PO CPEP: 36000.0000 [IU] | ORAL_CAPSULE | Freq: Three times a day (TID) | ORAL | 1 refills | Status: AC

## 2020-11-27 MED ORDER — ADULT MULTIVITAMIN W/MINERALS CH: 1.0000 | ORAL_TABLET | Freq: Every day | ORAL | Status: DC

## 2020-11-27 MED ORDER — THIAMINE HCL 100 MG PO TABS: 100.0000 mg | ORAL_TABLET | Freq: Every day | ORAL | 0 refills | Status: DC

## 2020-11-27 MED ORDER — FOLIC ACID 1 MG PO TABS
1.0000 mg | ORAL_TABLET | Freq: Every day | ORAL | 0 refills | Status: DC
Start: 2020-11-27 — End: 2021-01-04

## 2020-11-27 MED ORDER — OXYCODONE HCL 5 MG PO TABS: 5.0000 mg | ORAL_TABLET | Freq: Four times a day (QID) | ORAL | 0 refills | Status: DC | PRN

## 2020-11-27 MED ORDER — ENSURE ENLIVE PO LIQD: 237.0000 mL | Freq: Three times a day (TID) | ORAL | 0 refills | Status: DC

## 2020-11-27 MED ORDER — ESCITALOPRAM OXALATE 20 MG PO TABS: 20.0000 mg | ORAL_TABLET | Freq: Every day | ORAL | 1 refills | Status: DC

## 2020-11-27 NOTE — Discharge Summary (Signed)
[facility] NAME: Vanessa Romero    MR#:  [id number]  DATE OF BIRTH:  November 12, 1975  DATE OF ADMISSION:  11/22/2020 ADMITTING PHYSICIAN: Loletha Grayer, MD  DATE OF DISCHARGE: 11/27/2020  2:31 PM  PRIMARY CARE PHYSICIAN: Anselmo Pickler, MD    ADMISSION DIAGNOSIS:  Alcohol withdrawal ([place]) [F10.939] Syncope and collapse [R55] Aspiration pneumonia ([place]) [J69.0] Vaginal bleeding [N93.9] GI bleed [K92.2] Alcohol withdrawal syndrome with complication ([place]) [G25.427]  DISCHARGE DIAGNOSIS:  Principal Problem:   Alcohol withdrawal ([place]) Active Problems:   Tobacco dependence   Depression   LVH (left ventricular hypertrophy)   Chronic diarrhea   GI bleed   Seizure ([facility])   Acute blood loss anemia   Vaginal bleeding   Cervical mass   Protein-calorie malnutrition, severe   SECONDARY DIAGNOSIS:   Past Medical History:  Diagnosis Date   Alcohol abuse    Tobacco dependence     HOSPITAL COURSE:   Alcohol withdrawal.  The patient was on alcohol withdrawal protocol requiring quite a bit of IV Ativan.  The patient started doing better once I added standing dose of Valium.  The patient has gone through alcohol withdrawal here in the hospital and is ready for discharge home.  I will prescribe a Valium taper upon disposition. Cervical mass with vaginal bleeding.  Dr. Georgianne Fick took to the operating room for biopsies which are still pending at this point.  If the biopsies come back positive for cancerous process they we will call and set up an appointment with gynecological oncology. Acute blood loss anemia secondary to vaginal bleeding.  Patient had an EGD that was negative.  Patient received 2 units of packed red blood cells and 2 doses of IV iron during the hospital course.  Last hemoglobin 9.2 Seizure disorder on lamotrigine Depression on Lexapro Hypokalemia replaced during the hospital course Vitamin B12 deficiency.  I did  give a dose of IM B12 and recommend monthly B12 injections as outpatient. Vitamin D deficiency.  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  They will try to find a hematologist for iron infusions.  They will bring her back for appointments with gynecologist and medical doctor and gynecological oncology if needed.  DISCHARGE CONDITIONS:   Satisfactory  CONSULTS OBTAINED:  Treatment Team:  Malachy Mood, MD  DRUG ALLERGIES:  No Known Allergies  DISCHARGE MEDICATIONS:   Allergies as of 11/27/2020   No Known Allergies      Medication List     TAKE these medications    cyanocobalamin 1000 MCG tablet Take 1 tablet (1,000 mcg total) by mouth daily.   diazepam 2 MG tablet Commonly known as: VALIUM 1 tab po three times a day 1,2; 1 tab po twice a day 3,4; then 1 tab po a day day5,6,7   escitalopram 20 MG tablet Commonly known as: LEXAPRO Take 1 tablet (20 mg total) by mouth daily at 12 noon.   feeding supplement Liqd Take 237 mLs by mouth 3 (three) times daily between meals.   folic acid 1 MG tablet Commonly known as: FOLVITE Take 1 tablet (1 mg total) by mouth daily.   hydrOXYzine 10 MG tablet Commonly known as: ATARAX/VISTARIL Take 1 tablet (10 mg total) by mouth 3 (three) times daily as needed for up to 10 days for itching. What changed: reasons to take this  lamoTRIgine 25 MG tablet Commonly known as: LAMICTAL Take 2 tablets (50 mg total) by mouth daily.   lipase/protease/amylase 36000 UNITS Cpep capsule Commonly known as: CREON Take 1 capsule (36,000 Units total) by mouth 3 (three) times daily with meals.   multivitamin with minerals Tabs tablet Take 1 tablet by mouth daily.   oxyCODONE 5 MG immediate release tablet Commonly known as: Oxy IR/ROXICODONE Take 1 tablet (5 mg total) by mouth every 6  (six) hours as needed for severe pain.   thiamine 100 MG tablet Take 1 tablet (100 mg total) by mouth daily.   Vitamin D3 25 MCG tablet Commonly known as: Vitamin D Take 1 tablet (1,000 Units total) by mouth daily.         DISCHARGE INSTRUCTIONS:   Follow-up PMD 5 days Dr. Georgianne Fick will call you with biopsy results and set up gynecological oncology if biopsy is cancerous.  If you experience worsening of your admission symptoms, develop shortness of breath, life threatening emergency, suicidal or homicidal thoughts you must seek medical attention immediately by calling 911 or calling your MD immediately  if symptoms less severe.  You Must read complete instructions/literature along with all the possible adverse reactions/side effects for all the Medicines you take and that have been prescribed to you. Take any new Medicines after you have completely understood and accept all the possible adverse reactions/side effects.   Please note  You were cared for by a hospitalist during your hospital stay. If you have any questions about your discharge medications or the care you received while you were in the hospital after you are discharged, you can call the unit and asked to speak with the hospitalist on call if the hospitalist that took care of you is not available. Once you are discharged, your primary care physician will handle any further medical issues. Please note that NO REFILLS for any discharge medications will be authorized once you are discharged, as it is imperative that you return to your primary care physician (or establish a relationship with a primary care physician if you do not have one) for your aftercare needs so that they can reassess your need for medications and monitor your lab values.    Today   CHIEF COMPLAINT:   Chief Complaint  Patient presents with   detox   Loss of Consciousness    HISTORY OF PRESENT ILLNESS:  Vanessa Romero  is a 45 y.o. female came in  for alcohol detox   VITAL SIGNS:  Blood pressure (!) 130/96, pulse 71, temperature 97.6 F (36.4 C), temperature source Oral, resp. rate 18, height 5\' 7"  (1.702 m), weight 54.4 kg, SpO2 100 %.    PHYSICAL EXAMINATION:  GENERAL:  45 y.o.-year-old patient lying in the bed with no acute distress.  EYES: Pupils equal, round, reactive to light and accommodation. No scleral icterus.  HEENT: Head atraumatic, normocephalic. Oropharynx and nasopharynx clear.  LUNGS: Normal breath sounds bilaterally, no wheezing, rales,rhonchi or crepitation. No use of accessory muscles of respiration.  CARDIOVASCULAR: S1, S2 normal. No murmurs, rubs, or gallops.  ABDOMEN: Soft, slight lower abdominal tenderness, non-distended.  EXTREMITIES: No pedal edema, cyanosis, or clubbing.  NEUROLOGIC: Cranial nerves II through XII are intact. Muscle strength 5/5 in all extremities. Sensation intact. Gait not checked.  PSYCHIATRIC: The patient is alert and oriented x 3.  SKIN: No obvious rash, lesion, or ulcer.   DATA REVIEW:   CBC Recent Labs  Lab 11/26/20 0522  WBC 5.2  HGB 9.2*  HCT 30.5*  PLT 118*    Chemistries  Recent Labs  Lab 11/22/20 1014 11/23/20 0624 11/25/20 0558 11/26/20 0522  NA 135   < > 137  --   K 4.1   < > 3.3* 3.9  CL 94*   < > 107  --   CO2 21*   < > 25  --   GLUCOSE 99   < > 88  --   BUN 7   < > 6  --   CREATININE 0.44   < > 0.56  --   CALCIUM 9.0   < > 8.2*  --   MG 2.0  --  1.9  --   AST 153*  --   --   --   ALT 76*  --   --   --   ALKPHOS 73  --   --   --   BILITOT 0.9  --   --   --    < > = values in this interval not displayed.     Microbiology Results  Results for orders placed or performed during the hospital encounter of 11/22/20  Resp Panel by RT-PCR (Flu A&B, Covid)     Status: None   Collection Time: 11/22/20  1:24 PM   Specimen: Nasopharyngeal(NP) swabs in vial transport medium  Result Value Ref Range Status   SARS Coronavirus 2 by RT PCR NEGATIVE NEGATIVE  Final    Comment: (NOTE) SARS-CoV-2 target nucleic acids are NOT DETECTED.  The SARS-CoV-2 RNA is generally detectable in upper respiratory specimens during the acute phase of infection. The lowest concentration of SARS-CoV-2 viral copies this assay can detect is 138 copies/mL. A negative result does not preclude SARS-Cov-2 infection and should not be used as the sole basis for treatment or other patient management decisions. A negative result may occur with  improper specimen collection/handling, submission of specimen other than nasopharyngeal swab, presence of viral mutation(s) within the areas targeted by this assay, and inadequate number of viral copies(<138 copies/mL). A negative result must be combined with clinical observations, patient history, and epidemiological information. The expected result is Negative.  Fact Sheet for Patients:  EntrepreneurPulse.com.au  Fact Sheet for Healthcare Providers:  IncredibleEmployment.be  This test is no t yet approved or cleared by the [place] FDA and  has been authorized for detection and/or diagnosis of SARS-CoV-2 by FDA under an Emergency Use Authorization (EUA). This EUA will remain  in effect (meaning this test can be used) for the duration of the COVID-19 declaration under Section 564(b)(1) of the Act, 21 U.S.C.section 360bbb-3(b)(1), unless the authorization is terminated  or revoked sooner.       Influenza A by PCR NEGATIVE NEGATIVE Final   Influenza B by PCR NEGATIVE NEGATIVE Final    Comment: (NOTE) The Xpert Xpress SARS-CoV-2/FLU/RSV plus assay is intended as an aid in the diagnosis of influenza from Nasopharyngeal swab specimens and should not be used as a sole basis for treatment. Nasal washings and aspirates are unacceptable for Xpert Xpress SARS-CoV-2/FLU/RSV testing.  Fact Sheet for Patients: EntrepreneurPulse.com.au  Fact Sheet for Healthcare  Providers: IncredibleEmployment.be  This test is not yet approved or cleared by the [place] FDA and has been authorized for detection and/or diagnosis of SARS-CoV-2 by FDA under an Emergency Use Authorization (EUA). This EUA will remain in effect (meaning this test can be used) for the duration of the COVID-19 declaration under Section 564(b)(1) of the Act, 21 U.S.C. section 360bbb-3(b)(1), unless the authorization  is terminated or revoked.  Performed at [facility], [address], [place], [place] [address]       Management plans discussed with the patient, family and they are in agreement.  CODE STATUS:     Code Status Orders  (From admission, onward)           Start     Ordered   11/22/20 1303  Full code  Continuous        11/22/20 1306           Code Status History     This patient has a current code status but no historical code status.       TOTAL TIME TAKING CARE OF THIS PATIENT: 34 minutes.    Loletha Grayer M.D on 11/27/2020 at 4:50 PM   [facility]  CC: Primary care physician; Anselmo Pickler, MD

## 2020-11-27 NOTE — Progress Notes (Signed)
AVS given and reviewed with pt. Medications discussed. All questions answered to satisfaction. Pt verbalized understanding of information given. Pt escorted off the unit with all belongings via wheelchair by staff member.  

## 2020-11-27 NOTE — TOC Progression Note (Signed)
Transition of Care [facility]) - Progression Note    Patient Details  Name: Vanessa Romero MRN: [id number] Date of Birth: 28-Sep-1975  [facility] CM/SW [place], LCSW Phone Number: 11/27/2020, 10:51 AM  Clinical Narrative:   Good Rx card placed in DC packet.         Expected Discharge Plan and Services           Expected Discharge Date: 11/27/20                                     Social Determinants of Health (SDOH) Interventions    Readmission Risk Interventions No flowsheet data found.

## 2020-11-27 NOTE — Plan of Care (Signed)
  Problem: Education: Goal: Knowledge of General Education information will improve Description: Including pain rating scale, medication(s)/side effects and non-pharmacologic comfort measures Outcome: Progressing   Problem: Health Behavior/Discharge Planning: Goal: Ability to manage health-related needs will improve Outcome: Progressing   Problem: Clinical Measurements: Goal: Ability to maintain clinical measurements within normal limits will improve Outcome: Progressing Goal: Diagnostic test results will improve Outcome: Progressing   Problem: Activity: Goal: Risk for activity intolerance will decrease Outcome: Progressing   Problem: Nutrition: Goal: Adequate nutrition will be maintained Outcome: Progressing   Problem: Elimination: Goal: Will not experience complications related to bowel motility Outcome: Progressing Goal: Will not experience complications related to urinary retention Outcome: Progressing   Problem: Pain Managment: Goal: General experience of comfort will improve Outcome: Progressing   Problem: Safety: Goal: Ability to remain free from injury will improve Outcome: Progressing   Problem: Skin Integrity: Goal: Risk for impaired skin integrity will decrease Outcome: Progressing   

## 2020-11-29 ENCOUNTER — Other Ambulatory Visit: Payer: Self-pay | Admitting: Obstetrics and Gynecology

## 2020-11-29 ENCOUNTER — Telehealth: Payer: Self-pay | Admitting: Obstetrics and Gynecology

## 2020-11-29 ENCOUNTER — Telehealth: Payer: Self-pay

## 2020-11-29 DIAGNOSIS — C530 Malignant neoplasm of endocervix: Secondary | ICD-10-CM

## 2020-11-29 NOTE — Telephone Encounter (Signed)
This pt returned your call on 11/14.

## 2020-11-29 NOTE — Progress Notes (Signed)
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 extending the full thickness of the largest fragments in the first 2 samples, and the full length of the core biopsy. Special stains for further characterization to include HPV status are pending   Unable to reach patient by phone.  Mychart message sent and referral to gyn-onc initiated.   Malachy Mood, MD, [facility], [facility] 11/29/2020, 4:42 PM

## 2020-11-29 NOTE — Telephone Encounter (Signed)
Pt calling for bx results and next steps.  [phone]  Adv pt bx isn't back yet; usually take a week to 1 1/2 wks to get them back; pt asked if cancer what would next step be; adv face to face appt to discuss options such as surgery.

## 2020-11-30 ENCOUNTER — Telehealth: Payer: Self-pay

## 2020-11-30 LAB — OVARIAN MALIGNANCY RISK-ROMA
Cancer Antigen (CA) 125: 14.7 U/mL (ref 0.0–38.1)
HE4: 57.9 pmol/L (ref 0.0–63.6)
Postmenopausal ROMA: 1.3
Premenopausal ROMA: 1.02

## 2020-11-30 LAB — PREMENOPAUSAL INTERP: LOW

## 2020-11-30 LAB — COPPER, SERUM: Copper: 110 ug/dL (ref 80–158)

## 2020-11-30 LAB — POSTMENOPAUSAL INTERP: LOW

## 2020-11-30 LAB — ZINC: Zinc: 73 ug/dL (ref 44–115)

## 2020-11-30 NOTE — Telephone Encounter (Signed)
Referral received for Gyn Oncology. Records reviewed. Called and left voicemail with Vanessa Romero to return call for scheduling.

## 2020-11-30 NOTE — Telephone Encounter (Signed)
Received call back from Ms. Flakes. Appointment made for 11/16 at 1415. Directions to the cancer center given. All questions answered. Went over what to expect during consult.

## 2020-11-30 NOTE — Progress Notes (Signed)
Patient ID: Vanessa Romero, female   DOB: 08/31/1975, 45 y.o.   MRN: [id number]  Called patient and she said she spoke with Dr. Georgianne Fick about the biopsies coming back cancerous and she has an appointment tomorrow with gynecological oncologist to set up further plan.  Final results of the biopsy is not back in the computer yet.  Dr Loletha Grayer

## 2020-12-01 ENCOUNTER — Other Ambulatory Visit: Payer: Self-pay

## 2020-12-01 ENCOUNTER — Inpatient Hospital Stay: Payer: Medicare Other | Attending: Obstetrics and Gynecology | Admitting: Obstetrics and Gynecology

## 2020-12-01 ENCOUNTER — Inpatient Hospital Stay: Payer: Medicare Other

## 2020-12-01 VITALS — BP 108/74 | HR 76 | Temp 97.8°F | Resp 20 | Wt 130.1 lb

## 2020-12-01 DIAGNOSIS — F3181 Bipolar II disorder: Secondary | ICD-10-CM | POA: Diagnosis not present

## 2020-12-01 DIAGNOSIS — D500 Iron deficiency anemia secondary to blood loss (chronic): Secondary | ICD-10-CM | POA: Diagnosis not present

## 2020-12-01 DIAGNOSIS — R7401 Elevation of levels of liver transaminase levels: Secondary | ICD-10-CM | POA: Insufficient documentation

## 2020-12-01 DIAGNOSIS — D696 Thrombocytopenia, unspecified: Secondary | ICD-10-CM | POA: Diagnosis not present

## 2020-12-01 DIAGNOSIS — F419 Anxiety disorder, unspecified: Secondary | ICD-10-CM | POA: Insufficient documentation

## 2020-12-01 DIAGNOSIS — C539 Malignant neoplasm of cervix uteri, unspecified: Secondary | ICD-10-CM

## 2020-12-01 DIAGNOSIS — F1010 Alcohol abuse, uncomplicated: Secondary | ICD-10-CM | POA: Insufficient documentation

## 2020-12-01 DIAGNOSIS — T391X1A Poisoning by 4-Aminophenol derivatives, accidental (unintentional), initial encounter: Secondary | ICD-10-CM | POA: Insufficient documentation

## 2020-12-01 DIAGNOSIS — N939 Abnormal uterine and vaginal bleeding, unspecified: Secondary | ICD-10-CM | POA: Insufficient documentation

## 2020-12-01 DIAGNOSIS — G932 Benign intracranial hypertension: Secondary | ICD-10-CM | POA: Insufficient documentation

## 2020-12-01 DIAGNOSIS — Z9884 Bariatric surgery status: Secondary | ICD-10-CM | POA: Diagnosis not present

## 2020-12-01 DIAGNOSIS — F1020 Alcohol dependence, uncomplicated: Secondary | ICD-10-CM | POA: Insufficient documentation

## 2020-12-01 DIAGNOSIS — F17200 Nicotine dependence, unspecified, uncomplicated: Secondary | ICD-10-CM | POA: Diagnosis not present

## 2020-12-01 DIAGNOSIS — E43 Unspecified severe protein-calorie malnutrition: Secondary | ICD-10-CM | POA: Insufficient documentation

## 2020-12-01 DIAGNOSIS — Z809 Family history of malignant neoplasm, unspecified: Secondary | ICD-10-CM | POA: Insufficient documentation

## 2020-12-01 DIAGNOSIS — C538 Malignant neoplasm of overlapping sites of cervix uteri: Secondary | ICD-10-CM | POA: Insufficient documentation

## 2020-12-01 NOTE — Progress Notes (Signed)
Gynecologic Oncology Consult Visit   Referring Provider: Dr Georgianne Fick  Chief Complaint: Cervical Cancer  Subjective:  Vanessa Romero is a G50P2 45 y.o. female who is seen in consultation from Dr. Georgianne Fick for new cervical cancer diagnosis.   She initially presented to ER on 11/22/20 for loss of consciousness and detoxification from alcohol. Previously sober for 2 years. She was found to be anemic. Seen by GI and also reported vaginal bleeding. Gynecology, Dr. Gilman Schmidt was consulted. Reported daily vaginal bleeding x 1.5 years, post coital bleeding, general pelvic pain and discomfort, hx of prolapse. Family history of endometriosis in mother and ovarian cancer and endometriosis in grandmother. She denies history of abnormal pap smears.   Most recent pap was 06/15/20- NILM. HPV testing was not performed  11/22/20- HIV was negative  11/23/20- Pelvic ultrasound Uterus: Measurements: 9.9 x 5.4 x 5.7 cm = volume: 158 mL. Area of the cervix with variable echogenicitya, not well-defined with region measuring perhaps as much as 3.2 x 2.8 cm with mildly bulky appearance.   Endometrium not as well assessed due to "[place] blind sign" and globular appearance of the uterus.   Endometrium: Thickness: 10 mm. Limited assessment of the endometrium not well visualized due to suspected adenomyosis.   Right ovary: Measurements: 5.6 x 3.7 x 3.6 cm = volume: 38 mL. Multi-cystic appearance of the RIGHT ovary, 1 of these appears to show a septation. Small amount of free fluid adjacent to the RIGHT ovary. When measured in total cystic areas up to 4.8 x 3.9 x 2.7 cm. Very likely this is multiple adjacent follicles, a 2.7 x 1.6 cm area displays signs of septation. No visible mural nodularity.   Left ovary: Not visualized. The patient was not able to continue with the examination due to pelvic discomfort. Pulsed Doppler evaluation demonstrates normal low resistance arterial and venous waveforms on the RIGHT.   Other  findings:  Small free fluid in the pelvis.  11/24/20- MR Pelvis W Wo Contrast Mass in the cervix highly suspicious for cervical cancer with potential early stromal invasion/extension towards the RIGHT parametrium. No gross adenopathy in the pelvis No suspicious lesion in the RIGHT ovary with signs of ovarian cysts. Small endometrial implant likely along the leading edge of 1 of the ovarian cysts or the margin of the RIGHT ovary Suspected hematosalpinx due to endometriosis explaining the more septated appearing finding on the ultrasound.. Adenomyosis.  Small free fluid in the pelvis.  EUA and cervical biopsies - 11/26/20 11/26/20- Ectocervical, endocervical, and tru-cut biopsies are all positive for high grade invasive carcinoma extending the full thickness of the largest fragments in the first 2 samples and the full length of the core biopsy. Special stains for further characterization to include HPV status are pending. Pathology has yet to be finalized.   She continues to have vaginal bleeding. Endorses 30 lb weight loss over past year. She does vape. Previously smoked cigarettes.   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   Tylenol overdose 12/01/2020   Protein-calorie malnutrition, severe 11/26/2020   Cervical mass    Acute blood loss anemia    Vaginal bleeding    GI bleed 11/23/2020   Drop in hemoglobin    Seizure ([place])    Hypokalemia    Alcohol withdrawal ([place]) 11/22/2020   Tobacco dependence 11/22/2020   Depression 11/22/2020   LVH (  left ventricular hypertrophy) 11/22/2020   Chronic diarrhea 11/22/2020   Hyponatremia 08/27/2020   RUQ abdominal pain 06/16/2020   Chronic anemia 05/18/2020   History of alcohol abuse 03/24/2020   Diarrhea 03/02/2020   Bilateral primary osteoarthritis of knee 01/06/2020   Anastomotic ulcer  12/27/2019   Partial bowel obstruction ([place]) 12/25/2019   Opioid dependence with withdrawal ([place]) 06/23/2019   Bilateral lower extremity edema 02/28/2018   Prolonged Q-T interval on ECG 02/28/2018   Difficult airway for intubation 06/28/2016   Intracranial hypertension 06/28/2016   Bipolar 2 disorder ([place]) 05/16/2016   Iron deficiency anemia following bariatric surgery 10/22/2015   IIH (idiopathic intracranial hypertension) 09/02/2015   Intractable chronic migraine without aura and without status migrainosus 09/02/2015   S/P gastric bypass 09/02/2015   H/O small bowel obstruction 06/27/2013   S/P exploratory laparotomy 06/04/2012   Benign essential hypertension 01/24/2012   Mild intermittent asthma without complication 62/69/4854   Presence of cerebrospinal fluid drainage device 01/24/2012   Tobacco use disorder 01/24/2012   Substance abuse ([place]) 05/26/2011   Knee pain 05/11/2011    Past Medical History: Past Medical History:  Diagnosis Date   Alcohol abuse    Anemia    Tobacco dependence     Past Surgical History: Past Surgical History:  Procedure Laterality Date   ABDOMINAL ADHESION SURGERY     bowel obstruction     x2   CERVICAL CONIZATION W/BX N/A 11/26/2020   Procedure: CONIZATION CERVIX WITH BIOPSY;  Surgeon: Malachy Mood, MD;  Location: [facility] ORS;  Service: Gynecology;  Laterality: N/A;   ESOPHAGOGASTRODUODENOSCOPY N/A 11/24/2020   Procedure: ESOPHAGOGASTRODUODENOSCOPY (EGD);  Surgeon: Lin Landsman, MD;  Location: [facility] ENDOSCOPY;  Service: Gastroenterology;  Laterality: N/A;   laparoscopic knee surgery     ROUX-EN-Y GASTRIC BYPASS     TEAR DUCT PROBING     unclogg   VAGOTOMY     VENTRICULOPERITONEAL SHUNT     x6 put in and removals    Past Gynecologic History:  Menarche: age 24 Pain with menses Irregular menses Sexually active Denies history of STDs Denies history of abnormal pap  OB History:  OB History  Gravida Para Term Preterm AB Living   6 2 0 0 4 0  SAB IAB Ectopic Multiple Live Births  3 1 0 0 0    # Outcome Date GA Lbr Len/2nd Weight Sex Delivery Anes PTL Lv  6 Para           5 Para           4 IAB           3 SAB           2 SAB           1 SAB             Obstetric Comments  Vaginal deliveries    Family History: Family History  Problem Relation Age of Onset   Cancer Mother    Cancer Father    Cancer Maternal Grandmother     Social History: Social History   Socioeconomic History   Marital status: Married    Spouse name: Not on file   Number of children: Not on file   Years of education: Not on file   Highest education level: Not on file  Occupational History   Not on file  Tobacco Use   Smoking status: Former    Types: Cigarettes   Smokeless tobacco: Current  Vaping Use  Vaping Use: Some days  Substance and Sexual Activity   Alcohol use: Not Currently   Drug use: Not Currently   Sexual activity: Yes    Birth control/protection: None  Other Topics Concern   Not on file  Social History Narrative   Not on file   Social Determinants of Health   Financial Resource Strain: Not on file  Food Insecurity: Not on file  Transportation Needs: Not on file  Physical Activity: Not on file  Stress: Not on file  Social Connections: Not on file  Intimate Partner Violence: Not on file    Allergies: Allergies  Allergen Reactions   Contrast Media [Iodinated Diagnostic Agents] Hives   Gabapentin Other (See Comments), Rash and Palpitations    Other Reaction: tachycardia Other Reaction: tachycardia    Morphine Dermatitis, Hives, Rash, Swelling and Other (See Comments)    Other reaction(s): Unknown (comments) Has tolerated hydromorphone (Dilaudid) Immediate after injections arm edema and arm turned bright red Immediate after injections arm edema and arm turned bright red IV Morphine IV Morphine    Sumatriptan Dermatitis, Hives, Itching, Other (See Comments) and Swelling    Other  reaction(s): Joint Pain, Other (See Comments), Other (see comments), Unknown (comments) lock jaw Lock jaw Lock jaw Lock jaw TIGHTENING OF JAW Lock jaw lock jaw Lock jaw TIGHTENING OF JAW Lock jaw    Zolpidem Nausea And Vomiting and Other (See Comments)    Other reaction(s): Other (see comments) sleep walking sleep walking Sleep walking  don't tolerate it well    Erythromycin Diarrhea, Nausea And Vomiting and Nausea Only    Extreme upset stomach    Valproic Acid Rash    Other reaction(s): Other (see comments), Unknown MOOD DISORDER MOOD DISORDER Depakote: Reaction unknown     Acetazolamide     Other reaction(s): Unknown (comments)   Erythromycin Base     Other reaction(s): UNKNOWN   Amoxicillin Rash   Divalproex Sodium Anxiety and Other (See Comments)    Current Medications: Current Outpatient Medications  Medication Sig Dispense Refill   cholecalciferol (VITAMIN D) 25 MCG tablet Take 1 tablet (1,000 Units total) by mouth daily. 30 tablet 1   escitalopram (LEXAPRO) 20 MG tablet Take 1 tablet (20 mg total) by mouth daily at 12 noon. 30 tablet 1   feeding supplement (ENSURE ENLIVE / ENSURE PLUS) LIQD Take 237 mLs by mouth 3 (three) times daily between meals. 54650 mL 0   folic acid (FOLVITE) 1 MG tablet Take 1 tablet (1 mg total) by mouth daily. 30 tablet 0   hydrOXYzine (ATARAX/VISTARIL) 10 MG tablet Take 1 tablet (10 mg total) by mouth 3 (three) times daily as needed for up to 10 days for itching. 30 tablet 0   lamoTRIgine (LAMICTAL) 25 MG tablet Take 2 tablets (50 mg total) by mouth daily. 60 tablet 1   Multiple Vitamin (MULTIVITAMIN WITH MINERALS) TABS tablet Take 1 tablet by mouth daily.     thiamine 100 MG tablet Take 1 tablet (100 mg total) by mouth daily. 30 tablet 0   vitamin B-12 1000 MCG tablet Take 1 tablet (1,000 mcg total) by mouth daily. 30 tablet 0   lipase/protease/amylase (CREON) 36000 UNITS CPEP capsule Take 1 capsule (36,000 Units total) by mouth 3  (three) times daily with meals. (Patient not taking: Reported on 12/01/2020) 180 capsule 1   No current facility-administered medications for this visit.    General: negative for fevers, changes in weight or night sweats. Positive for weakness Skin: negative for  changes in moles or sores or rash Eyes: negative for changes in vision HEENT: negative for change in hearing, tinnitus, voice changes Pulmonary: negative for dyspnea, orthopnea, productive cough, wheezing Cardiac: negative for palpitations, pain Gastrointestinal: positive for nausea, vomiting, diarrhea Genitourinary/Sexual: negative for dysuria, retention, incontinence. Positive for hematuria Ob/Gyn: positive for bleeding and pain Musculoskeletal: negative for pain, joint pain, back pain Hematology: negative for easy bruising, positive for weakness Neurologic/Psych: negative for seizures, paralysis, weakness, numbness. Positive for headaches  Objective:  Physical Examination:  BP 108/74   Pulse 76   Temp 97.8 F (36.6 C)   Resp 20   Wt 130 lb 1.6 oz (59 kg)   SpO2 100%   BMI 20.38 kg/m     ECOG Performance Status: 1 - Symptomatic but completely ambulatory  GENERAL: Patient is a well appearing female in no acute distress HEENT:  PERRL, neck supple with midline trachea. Thyroid without masses.  NODES:  No cervical, supraclavicular, axillary, or inguinal lymphadenopathy palpated.  LUNGS:  Clear to auscultation bilaterally.  No wheezes or rhonchi. HEART:  Regular rate and rhythm. No murmur appreciated. ABDOMEN:  Soft, nontender, nondistended.  Vertical incision with hernia upper aspect.  MSK:  No focal spinal tenderness to palpation. Full range of motion bilaterally in the upper extremities. EXTREMITIES:  No peripheral edema.   SKIN:  Clear with no obvious rashes or skin changes. No nail dyscrasia. NEURO:  Nonfocal. Well oriented.  Appropriate affect.  Pelvic- Exam chaperoned by Nursing EGBUS: no lesions Cervix: no  lesions, rock hard to palpation and tender Vagina: no lesions, no discharge or bleeding Uterus: normal size, retroverted, mobile but tender with movement BME: suspect medial parametrial involvement on right - very tender on exam. On the left shortened uterosacral ligament. Disease does not extend to the sidewall.  Adnexa: no palpable masses Rectovaginal: confirmatory  Lab Review Lab Results  Component Value Date   WBC 5.2 11/26/2020   HGB 9.2 (L) 11/26/2020   HCT 30.5 (L) 11/26/2020   MCV 67.5 (L) 11/26/2020   PLT 118 (L) 11/26/2020     Chemistry      Component Value Date/Time   NA 137 11/25/2020 0558   K 3.9 11/26/2020 0522   CL 107 11/25/2020 0558   CO2 25 11/25/2020 0558   BUN 6 11/25/2020 0558   CREATININE 0.56 11/25/2020 0558      Component Value Date/Time   CALCIUM 8.2 (L) 11/25/2020 0558   ALKPHOS 73 11/22/2020 1014   AST 153 (H) 11/22/2020 1014   ALT 76 (H) 11/22/2020 1014   BILITOT 0.9 11/22/2020 1014       Radiologic Imaging: Pending    Assessment:  Vanessa Romero is a 45 y.o. female diagnosed with at least stage IIB cervical cancer.   Elevated transaminases  Poor IV access  Medical co-morbidities complicating care: h/o malnutrition, alcohol/tobacco use Plan:   Problem List Items Addressed This Visit   None Visit Diagnoses     Malignant neoplasm of cervix, unspecified site ([place])    -  Primary   Relevant Orders   NM PET Image Initial (PI) Skull Base To Thigh   Ambulatory referral to Radiation Oncology   Ambulatory referral to Hematology / Oncology   Anemia due to chronic blood loss          We discussed options for management and I recommended PET/CT, Radiation Oncology consult, and Medical Oncology consult.   Poor IV access - recommended consideration for IV port and to discuss with her  Medical Oncologist  Suggested return to clinic in  1-3 months after completion of radiation therapy.   The patient's diagnosis, an outline of the further  diagnostic and laboratory studies which will be required, the recommendation for surgery, and alternatives were discussed with her and her accompanying family members.  All questions were answered to their satisfaction.  Beckey Rutter, NP  I personally had a face to face interaction and evaluated the patient jointly with the NP, Ms. Beckey Rutter.  I have reviewed her history and available records and have performed the key portions of the physical exam including lymph node survey, abdominal exam, pelvic exam with my findings confirming those documented above by the APP.  I have discussed the case with the APP and the patient.  I agree with the above documentation, assessment and plan which was fully formulated by me.  Counseling was completed by me.   I personally saw the patient and performed a substantive portion of this encounter in conjunction with the listed APP as documented above.  Collectively a total of at least 80 minutes were spent with the patient/family today; >50% was spent in education, counseling and coordination of care for cervical cancer.   Sherena Machorro Gaetana Michaelis, MD

## 2020-12-01 NOTE — Progress Notes (Signed)
Patient states she is having on and off bleeding for the last year and a half. Patient states she is bleeding during and after sex. Patient states she usually has a regular cycle but the last year she has been irregular.

## 2020-12-02 ENCOUNTER — Other Ambulatory Visit: Payer: Self-pay

## 2020-12-02 ENCOUNTER — Telehealth: Payer: Self-pay

## 2020-12-02 DIAGNOSIS — C539 Malignant neoplasm of cervix uteri, unspecified: Secondary | ICD-10-CM

## 2020-12-02 NOTE — Telephone Encounter (Signed)
Called and notified of PET appointment details/instructions.

## 2020-12-03 LAB — VITAMIN E
Vitamin E (Alpha Tocopherol): 8 mg/L (ref 7.0–25.1)
Vitamin E(Gamma Tocopherol): 1.4 mg/L (ref 0.5–5.5)

## 2020-12-05 LAB — VITAMIN A: Vitamin A (Retinoic Acid): 27.1 ug/dL (ref 20.1–62.0)

## 2020-12-06 ENCOUNTER — Encounter
Admission: RE | Admit: 2020-12-06 | Discharge: 2020-12-06 | Disposition: A | Discharge status: Home / Self Care | Payer: Medicare Other | Source: Ambulatory Visit | Attending: Nurse Practitioner | Admitting: Nurse Practitioner

## 2020-12-06 ENCOUNTER — Other Ambulatory Visit: Payer: Self-pay

## 2020-12-06 DIAGNOSIS — C539 Malignant neoplasm of cervix uteri, unspecified: Secondary | ICD-10-CM | POA: Insufficient documentation

## 2020-12-06 LAB — GLUCOSE, CAPILLARY: Glucose-Capillary: 79 mg/dL (ref 70–99)

## 2020-12-06 MED ORDER — FLUDEOXYGLUCOSE F - 18 (FDG) INJECTION
6.7000 | Freq: Once | INTRAVENOUS | Status: AC | PRN
  Administered 2020-12-06: 7.08 via INTRAVENOUS

## 2020-12-06 NOTE — H&P (View-Only) (Signed)
[place]  [phone] [phone]  ID: Vanessa Romero OB: 1976/01/10  MR#: [id number]  OEV#:[id number]  Patient Care Team: Anselmo Pickler, MD as PCP - General (Internal Medicine)  CHIEF COMPLAINT: Stage IIb adenocarcinoma of the cervix.  INTERVAL HISTORY: Patient is a 45 year old female recently diagnosed with stage IIb adenocarcinoma of the cervix and was determined not to be a surgical candidate.  She is referred for evaluation and consideration of concurrent chemotherapy and XRT.  She is anxious, but otherwise feels well.  She has no neurologic complaints.  She denies any recent fevers or illnesses.  She has a good appetite and denies weight loss.  She does not complain of pain today.  She has no chest pain, shortness of breath, cough, or hemoptysis.  She denies any nausea, vomiting, constipation, or diarrhea.  She has no urinary complaints.  Patient offers no further specific complaints today.  REVIEW OF SYSTEMS:   Review of Systems  Constitutional: Negative.  Negative for fever, malaise/fatigue and weight loss.  Respiratory: Negative.  Negative for cough, hemoptysis and shortness of breath.   Cardiovascular: Negative.  Negative for chest pain and leg swelling.  Gastrointestinal: Negative.  Negative for abdominal pain.  Genitourinary: Negative.  Negative for dysuria.  Musculoskeletal: Negative.  Negative for back pain.  Skin: Negative.  Negative for rash.  Neurological: Negative.  Negative for dizziness, focal weakness, weakness and headaches.  Psychiatric/Behavioral:  The patient is nervous/anxious.    As per HPI. Otherwise, a complete review of systems is negative.  PAST MEDICAL HISTORY: Past Medical History:  Diagnosis Date   Alcohol abuse    Anemia    Tobacco dependence     PAST SURGICAL HISTORY: Past Surgical History:  Procedure Laterality Date   ABDOMINAL ADHESION SURGERY     bowel obstruction     x2   CERVICAL  CONIZATION W/BX N/A 11/26/2020   Procedure: CONIZATION CERVIX WITH BIOPSY;  Surgeon: Malachy Mood, MD;  Location: [facility] ORS;  Service: Gynecology;  Laterality: N/A;   ESOPHAGOGASTRODUODENOSCOPY N/A 11/24/2020   Procedure: ESOPHAGOGASTRODUODENOSCOPY (EGD);  Surgeon: Lin Landsman, MD;  Location: [facility];  Service: Gastroenterology;  Laterality: N/A;   laparoscopic knee surgery     ROUX-EN-Y GASTRIC BYPASS     TEAR DUCT PROBING     unclogg   VAGOTOMY     VENTRICULOPERITONEAL SHUNT     x6 put in and removals    FAMILY HISTORY: Family History  Problem Relation Age of Onset   Cancer Mother    Cancer Father    Cancer Maternal Grandmother     ADVANCED DIRECTIVES (Y/N):  N  HEALTH MAINTENANCE: Social History   Tobacco Use   Smoking status: Former    Types: Cigarettes   Smokeless tobacco: Current  Vaping Use   Vaping Use: Some days  Substance Use Topics   Alcohol use: Not Currently   Drug use: Not Currently     Colonoscopy:  PAP:  Bone density:  Lipid panel:  Allergies  Allergen Reactions   Contrast Media [Iodinated Diagnostic Agents] Hives   Gabapentin Other (See Comments), Rash and Palpitations    Other Reaction: tachycardia Other Reaction: tachycardia    Morphine Dermatitis, Hives, Rash, Swelling and Other (See Comments)    Other reaction(s): Unknown (comments) Has tolerated hydromorphone (Dilaudid) Immediate after injections arm edema and arm turned bright red Immediate after injections arm edema and arm turned bright red IV Morphine IV Morphine    Sumatriptan Dermatitis, Hives, Itching, Other (  See Comments) and Swelling    Other reaction(s): Joint Pain, Other (See Comments), Other (see comments), Unknown (comments) lock jaw Lock jaw Lock jaw Lock jaw TIGHTENING OF JAW Lock jaw lock jaw Lock jaw TIGHTENING OF JAW Lock jaw    Zolpidem Nausea And Vomiting and Other (See Comments)    Other reaction(s): Other (see comments) sleep  walking sleep walking Sleep walking  don't tolerate it well    Erythromycin Diarrhea, Nausea And Vomiting and Nausea Only    Extreme upset stomach    Valproic Acid Rash    Other reaction(s): Other (see comments), Unknown MOOD DISORDER MOOD DISORDER Depakote: Reaction unknown     Acetazolamide     Other reaction(s): Unknown (comments)   Erythromycin Base     Other reaction(s): UNKNOWN   Amoxicillin Rash   Divalproex Sodium Anxiety and Other (See Comments)    Current Outpatient Medications  Medication Sig Dispense Refill   cholecalciferol (VITAMIN D) 25 MCG tablet Take 1 tablet (1,000 Units total) by mouth daily. 30 tablet 1   escitalopram (LEXAPRO) 20 MG tablet Take 1 tablet (20 mg total) by mouth daily at 12 noon. 30 tablet 1   feeding supplement (ENSURE ENLIVE / ENSURE PLUS) LIQD Take 237 mLs by mouth 3 (three) times daily between meals. 40981 mL 0   folic acid (FOLVITE) 1 MG tablet Take 1 tablet (1 mg total) by mouth daily. 30 tablet 0   hydrOXYzine (ATARAX/VISTARIL) 10 MG tablet Take 1 tablet (10 mg total) by mouth 3 (three) times daily as needed for up to 10 days for itching. 30 tablet 0   lamoTRIgine (LAMICTAL) 25 MG tablet Take 2 tablets (50 mg total) by mouth daily. 60 tablet 1   lipase/protease/amylase (CREON) 36000 UNITS CPEP capsule Take 1 capsule (36,000 Units total) by mouth 3 (three) times daily with meals. 180 capsule 1   Multiple Vitamin (MULTIVITAMIN WITH MINERALS) TABS tablet Take 1 tablet by mouth daily.     thiamine 100 MG tablet Take 1 tablet (100 mg total) by mouth daily. 30 tablet 0   vitamin B-12 1000 MCG tablet Take 1 tablet (1,000 mcg total) by mouth daily. 30 tablet 0   No current facility-administered medications for this visit.    OBJECTIVE: There were no vitals filed for this visit.   There is no height or weight on file to calculate BMI.    ECOG FS:0 - Asymptomatic  General: Well-developed, well-nourished, no acute distress. Eyes: Pink  conjunctiva, anicteric sclera. HEENT: Normocephalic, moist mucous membranes. Lungs: No audible wheezing or coughing. Heart: Regular rate and rhythm. Abdomen: Soft, nontender, no obvious distention. Musculoskeletal: No edema, cyanosis, or clubbing. Neuro: Alert, answering all questions appropriately. Cranial nerves grossly intact. Skin: No rashes or petechiae noted. Psych: Normal affect. Lymphatics: No cervical, calvicular, axillary or inguinal LAD.   LAB RESULTS:  Lab Results  Component Value Date   NA 137 11/25/2020   K 3.9 11/26/2020   CL 107 11/25/2020   CO2 25 11/25/2020   GLUCOSE 88 11/25/2020   BUN 6 11/25/2020   CREATININE 0.56 11/25/2020   CALCIUM 8.2 (L) 11/25/2020   PROT 7.4 11/22/2020   ALBUMIN 4.2 11/22/2020   AST 153 (H) 11/22/2020   ALT 76 (H) 11/22/2020   ALKPHOS 73 11/22/2020   BILITOT 0.9 11/22/2020   GFRNONAA >60 11/25/2020    Lab Results  Component Value Date   WBC 5.2 11/26/2020   NEUTROABS 12.0 (H) 11/22/2020   HGB 9.2 (L) 11/26/2020  HCT 30.5 (L) 11/26/2020   MCV 67.5 (L) 11/26/2020   PLT 118 (L) 11/26/2020     STUDIES: DG Chest 2 View  Result Date: 11/22/2020 CLINICAL DATA:  Decreased consciousness EXAM: CHEST - 2 VIEW COMPARISON:  10/22/2015 from [facility] radiology. FINDINGS: Cholecystectomy clips. Midline trachea. Normal heart size and mediastinal contours. No pleural effusion or pneumothorax. Mediastinal surgical clips. Clear lungs. IMPRESSION: No acute cardiopulmonary disease. Electronically Signed   By: Abigail Miyamoto M.D.   On: 11/22/2020 13:33   CT Head Wo Contrast  Result Date: 11/22/2020 CLINICAL DATA:  Head trauma, abnormal mental status (Age 17-64y); Neck trauma, intoxicated or obtunded (Age >= 16y) EXAM: CT HEAD WITHOUT CONTRAST CT CERVICAL SPINE WITHOUT CONTRAST TECHNIQUE: Multidetector CT imaging of the head and cervical spine was performed following the standard protocol without intravenous contrast. Multiplanar CT image  reconstructions of the cervical spine were also generated. COMPARISON:  None. FINDINGS: CT HEAD FINDINGS Brain: No evidence of acute large vascular territory infarction, hemorrhage, hydrocephalus, extra-axial collection or mass lesion/mass effect. Encephalomalacia in the left frontal lobe, extending to the left anterior periventricular white matter. Vascular: No hyperdense vessel identified. Skull: Left frontal and right occipital burr holes. No acute fracture. Sinuses/Orbits: Visualized sinuses are clear. Visualized orbits are unremarkable. Other: No mastoid effusions. CT CERVICAL SPINE FINDINGS Alignment: No substantial sagittal subluxation. Mild broad levocurvature. Skull base and vertebrae: No evidence of acute fracture. Vertebral body heights are maintained. Soft tissues and spinal canal: No prevertebral fluid or swelling. No visible canal hematoma. Approximately 8 mm right thyroid nodule, which is not require further imaging follow-up (ref: J Am Coll Radiol. 2015 Feb;12(2): 143-50). Disc levels: Mild-to-moderate degenerative disease on the left at C6-C7 where there is endplate sclerosis, disc height loss and posterior endplate spurring. Upper chest: Visualized lung apices are clear. IMPRESSION: CT head: 1. No evidence of acute intracranial abnormality. 2. Left frontal lobe encephalomalacia. CT cervical spine: 1. No evidence of acute fracture or traumatic malalignment. 2. Mild-to-moderate C6-C7 degenerative change. Electronically Signed   By: Margaretha Sheffield M.D.   On: 11/22/2020 11:39   CT Cervical Spine Wo Contrast  Result Date: 11/22/2020 CLINICAL DATA:  Head trauma, abnormal mental status (Age 32-64y); Neck trauma, intoxicated or obtunded (Age >= 16y) EXAM: CT HEAD WITHOUT CONTRAST CT CERVICAL SPINE WITHOUT CONTRAST TECHNIQUE: Multidetector CT imaging of the head and cervical spine was performed following the standard protocol without intravenous contrast. Multiplanar CT image reconstructions of the  cervical spine were also generated. COMPARISON:  None. FINDINGS: CT HEAD FINDINGS Brain: No evidence of acute large vascular territory infarction, hemorrhage, hydrocephalus, extra-axial collection or mass lesion/mass effect. Encephalomalacia in the left frontal lobe, extending to the left anterior periventricular white matter. Vascular: No hyperdense vessel identified. Skull: Left frontal and right occipital burr holes. No acute fracture. Sinuses/Orbits: Visualized sinuses are clear. Visualized orbits are unremarkable. Other: No mastoid effusions. CT CERVICAL SPINE FINDINGS Alignment: No substantial sagittal subluxation. Mild broad levocurvature. Skull base and vertebrae: No evidence of acute fracture. Vertebral body heights are maintained. Soft tissues and spinal canal: No prevertebral fluid or swelling. No visible canal hematoma. Approximately 8 mm right thyroid nodule, which is not require further imaging follow-up (ref: J Am Coll Radiol. 2015 Feb;12(2): 143-50). Disc levels: Mild-to-moderate degenerative disease on the left at C6-C7 where there is endplate sclerosis, disc height loss and posterior endplate spurring. Upper chest: Visualized lung apices are clear. IMPRESSION: CT head: 1. No evidence of acute intracranial abnormality. 2. Left frontal lobe encephalomalacia. CT  cervical spine: 1. No evidence of acute fracture or traumatic malalignment. 2. Mild-to-moderate C6-C7 degenerative change. Electronically Signed   By: Margaretha Sheffield M.D.   On: 11/22/2020 11:39   MR PELVIS W WO CONTRAST  Result Date: 11/24/2020 CLINICAL DATA:  Abnormal findings on recent pelvic sonogram. EXAM: MRI PELVIS WITHOUT AND WITH CONTRAST TECHNIQUE: Multiplanar multisequence MR imaging of the pelvis was performed both before and after administration of intravenous contrast. CONTRAST:  12mL GADAVIST GADOBUTROL 1 MMOL/ML IV SOLN COMPARISON:  Pelvic sonogram from November 24, 2020. FINDINGS: Urinary Tract: Urinary bladder is  collapsed with smooth contours. No distal ureteral dilation. Bowel: Bowel with very limited assessment on pelvic MRI performed for cervical and adnexal evaluation. No gross bowel abnormality. Vascular/Lymphatic: Vascular structures are patent as visualized from the internal-external iliac bifurcation through the pelvis. No adenopathy in the imaged portions of the pelvis. Study focused on cervical and ovarian evaluation Reproductive: Uterus is retroflexed with 9.8 x 5.3 x 6.3 cm greatest dimensions. Thickening of the junctional zone is noted diffusely up to 16 mm with normal appearance of the endometrium above the cervical mass that is described below. There is expansion of the endocervical canal with intermediate T2 signal throughout the endocervix with a masslike appearance that measures approximately 2.5 x 2.7 x 3.0 cm. This shows restricted diffusion. On axial images there may be early extension to the edge or just beyond the cervical stroma on image 31 of series 4 though the exam is limited due to patient motion. This area shows marked enhancement as well. Coronal images display greatest abnormality tracking towards the RIGHT cervix and vaginal fornix RIGHT ovary in total measuring approximately 5.0 x 4.6 x 4.5 cm. Simple appearing cysts along the inferior aspect. Along the anterior and superior aspect there are 2 cystic areas that display "T2 shading and elevated signal on T1 intrinsically. Largest measuring 2.3 x 1.8 cm the next largest measuring 19 x 13 mm. Question of septation or serpiginous morphology tracking towards the RIGHT uterine fundus. The LEFT ovary displays a normal appearance additional small focus of increased T1 signal on image 31 of series 9 along the leading edge of more simple appearing cystic areas. No intrinsic T1 signal noted in the region of the cervix. No enhancement within RIGHT cystic ovarian and paraovarian process. Other:  Small free fluid in the pelvis. Musculoskeletal: No  suspicious bone lesions identified. IMPRESSION: Mass in the cervix highly suspicious for cervical cancer with potential early stromal invasion/extension towards the RIGHT parametrium. No gross adenopathy in the pelvis. Pelvic imaging limited to lower pelvis on today's evaluation. Also staging somewhat limited due to motion artifact. No signs of hydroureter No suspicious lesion in the RIGHT ovary with signs of ovarian cysts. Small endometrial implant likely along the leading edge of 1 of the ovarian cysts or the margin of the RIGHT ovary. Suspected hematosalpinx due to endometriosis explaining the more septated appearing finding on the ultrasound. Adenomyosis. Small free fluid in the pelvis. Electronically Signed   By: Zetta Bills M.D.   On: 11/24/2020 19:39   US PELVIS TRANSVAGINAL NON-OB (TV ONLY)  Result Date: 11/24/2020 CLINICAL DATA:  A 45 year old female presents with vaginal bleeding for 3 months, negative pregnancy test. EXAM: TRANSVAGINAL ULTRASOUND OF PELVIS DOPPLER ULTRASOUND OF OVARIES TECHNIQUE: Transvaginal ultrasound examination of the pelvis was performed including evaluation of the uterus, ovaries, adnexal regions, and pelvic cul-de-sac. Color and duplex Doppler ultrasound was utilized to evaluate blood flow to the ovaries. COMPARISON:  Pelvic sonogram from  November 23, 2020, trans abdominal sonogram. FINDINGS: Uterus Measurements: 9.9 x 5.4 x 5.7 cm = volume: 158 mL. Area of the cervix with variable echogenicitya, not well-defined with region measuring perhaps as much as 3.2 x 2.8 cm with mildly bulky appearance. Endometrium not as well assessed due to "[place] blind sign" and globular appearance of the uterus. Endometrium Thickness: 10 mm. Limited assessment of the endometrium not well visualized due to suspected adenomyosis. Right ovary Measurements: 5.6 x 3.7 x 3.6 cm = volume: 38 mL. Multi-cystic appearance of the RIGHT ovary, 1 of these appears to show a septation. Small amount of free  fluid adjacent to the RIGHT ovary. When measured in total cystic areas up to 4.8 x 3.9 x 2.7 cm. Very likely this is multiple adjacent follicles, a 2.7 x 1.6 cm area displays signs of septation. No visible mural nodularity. Left ovary Not visualized. The patient was not able to continue with the examination due to pelvic discomfort. Pulsed Doppler evaluation demonstrates normal low resistance arterial and venous waveforms on the RIGHT. Other findings:  Small free fluid in the pelvis. IMPRESSION: Globular appearance of the uterus with indistinct endometrium. Findings may reflect [place]. Endometrium not well assessed, in the setting of abnormal uterine bleeding MRI may be helpful for further evaluation. Sonohysterogram and biopsy could also be considered as warranted. Indistinct appearance of the cervix with area of decreased echogenicity in the area of the cervix raising the question of cervical mass. MRI may also be helpful in this regard for further evaluation. Multi cystic appearance of the RIGHT ovary favored to represent a collection of follicles. No signs of ovarian torsion at this time. Consider 6-12 week follow-up given the septation. This area could also be further assessed with MRI if performed. Would also correlate with any clinical signs that would suggest PID. LEFT ovary not assessed due to patient discomfort. These results will be called to the ordering clinician or representative by the Radiologist Assistant, and communication documented in the PACS or [employer]. Electronically Signed   By: Zetta Bills M.D.   On: 11/24/2020 10:35   US PELVIS (TRANSABDOMINAL ONLY)  Result Date: 11/23/2020 CLINICAL DATA:  Intermittent irregular vaginal bleeding for 3 months EXAM: TRANSABDOMINAL ULTRASOUND OF PELVIS TECHNIQUE: Transabdominal ultrasound examination of the pelvis was performed including evaluation of the uterus, ovaries, adnexal regions, and pelvic cul-de-sac. Transvaginal imaging was  not ordered. Transabdominal exam was terminated prematurely by patient due to needing to void, with incomplete adnexal assessment. COMPARISON:  None FINDINGS: Uterus Measurements: 9.0 x 5.0 x 5.7 cm = volume: 134 mL. Retroverted. Normal morphology without mass Endometrium Thickness: 9 mm. Small amount of nonspecific endometrial fluid. No discrete endometrial mass. Right ovary Measurements: 5.8 x 3.4 x 3.1 cm = volume: 32 mL. Complex hypoechoic nodule 3.0 cm diameter, question hemorrhagic cyst. Additional complex hypoechoic nodule with internal echogenicity, potentially small corpus luteum. Left ovary Measurements: 3.5 x 2.3 x 1.6 cm = volume: 6.8 mL. Normal morphology without mass Other findings: No free pelvic fluid. No adnexal masses grossly identified. IMPRESSION: Small amount of nonspecific endometrial fluid. Complex hypoechoic nodule RIGHT ovary 3.0 cm diameter, question hemorrhagic cyst, suboptimally assessed; either transvaginal ultrasound characterization now or follow-up ultrasound in 6-12 weeks recommended. Electronically Signed   By: Lavonia Dana M.D.   On: 11/23/2020 18:38   NM PET Image Initial (PI) Skull Base To Thigh  Result Date: 12/06/2020 CLINICAL DATA:  Initial treatment strategy for cervical cancer. EXAM: NUCLEAR MEDICINE PET SKULL BASE  TO THIGH TECHNIQUE: 7.08 mCi F-18 FDG was injected intravenously. Full-ring PET imaging was performed from the skull base to thigh after the radiotracer. CT data was obtained and used for attenuation correction and anatomic localization. Fasting blood glucose: 79 mg/dl COMPARISON:  Pelvic MRI 11/24/2020. Pelvic ultrasound 11/24/2020. Chest CT 05/22/2018 (report only). FINDINGS: Mediastinal blood pool activity: SUV max 1.4 NECK: No hypermetabolic cervical lymph nodes are identified.There are no lesions of the pharyngeal mucosal space. Incidental CT findings: none CHEST: There are no hypermetabolic mediastinal, hilar or axillary lymph nodes. No hypermetabolic  pulmonary activity or suspicious nodularity. Incidental CT findings: Small posterior mediastinal calcifications with postsurgical changes at the gastroesophageal junction. ABDOMEN/PELVIS: There is no hypermetabolic activity within the liver, adrenal glands, spleen or pancreas. There is no hypermetabolic nodal activity. There is hypermetabolic activity within the previously demonstrated cervical mass (SUV max 5.6). There is no extension of this abnormal metabolic activity beyond the cervix. No suspicious adnexal activity. Incidental CT findings: No hydronephrosis. Mild extrahepatic biliary dilatation post cholecystectomy. Postsurgical changes in the stomach suggesting previous gastric bypass procedure. SKELETON: There is no hypermetabolic activity to suggest osseous metastatic disease. Incidental CT findings: none IMPRESSION: 1. The patient's known cervical cancer is hypermetabolic. No evidence of extrauterine extension of tumor, abdominopelvic adenopathy or distant metastases. 2. No hydronephrosis or suspicious adnexal findings. 3. Postsurgical changes as described. Electronically Signed   By: Richardean Sale M.D.   On: 12/06/2020 13:49    ASSESSMENT: Stage IIb adenocarcinoma of the cervix.  PLAN:    Stage IIb adenocarcinoma of the cervix: By report patient is not a surgical candidate, therefore will proceed with concurrent XRT along with weekly chemotherapy using carboplatinum and Taxol.  She also will benefit from adjuvant brachytherapy.  Patient had consultation with radiation oncology earlier today.  PET scan results from December 06, 2020 reviewed independently and reported as above with no obvious evidence of malignancy outside of patient's known cervical cancer.  Return to clinic in 2 weeks for further evaluation and consideration of cycle 1 of weekly carboplatinum and Taxol. Anemia: Improved.  Patient's most recent hemoglobin was 9.2. Thrombocytopenia: Mild, possibly secondary to recent excessive  alcohol use.  Most recent platelet count was 118.  I spent a total of 60 minutes reviewing chart data, face-to-face evaluation with the patient, counseling and coordination of care as detailed above.   Patient expressed understanding and was in agreement with this plan. She also understands that She can call clinic at any time with any questions, concerns, or complaints.    Cancer Staging  Cervical cancer, FIGO stage IIB ([place]) Staging form: Cervix Uteri, AJCC Version 9 - Clinical stage from 12/06/2020: FIGO Stage IIB (cT2b, cN0, cM0) - Signed by Lloyd Huger, MD on 12/06/2020 Stage prefix: Initial diagnosis  Lloyd Huger, MD   12/07/2020 3:17 PM

## 2020-12-06 NOTE — Progress Notes (Signed)
Vanessa Romero  [phone] [phone]  ID: Vanessa Romero OB: 09-Apr-1975  MR#: [id number]  FMB#:[id number]  Patient Care Team: Anselmo Pickler, MD as PCP - General (Internal Medicine)  CHIEF COMPLAINT: Stage IIb adenocarcinoma of the cervix.  INTERVAL HISTORY: Patient is a 45 year old female recently diagnosed with stage IIb adenocarcinoma of the cervix and was determined not to be a surgical candidate.  She is referred for evaluation and consideration of concurrent chemotherapy and XRT.  She is anxious, but otherwise feels well.  She has no neurologic complaints.  She denies any recent fevers or illnesses.  She has a good appetite and denies weight loss.  She does not complain of pain today.  She has no chest pain, shortness of breath, cough, or hemoptysis.  She denies any nausea, vomiting, constipation, or diarrhea.  She has no urinary complaints.  Patient offers no further specific complaints today.  REVIEW OF SYSTEMS:   Review of Systems  Constitutional: Negative.  Negative for fever, malaise/fatigue and weight loss.  Respiratory: Negative.  Negative for cough, hemoptysis and shortness of breath.   Cardiovascular: Negative.  Negative for chest pain and leg swelling.  Gastrointestinal: Negative.  Negative for abdominal pain.  Genitourinary: Negative.  Negative for dysuria.  Musculoskeletal: Negative.  Negative for back pain.  Skin: Negative.  Negative for rash.  Neurological: Negative.  Negative for dizziness, focal weakness, weakness and headaches.  Psychiatric/Behavioral:  The patient is nervous/anxious.    As per HPI. Otherwise, a complete review of systems is negative.  PAST MEDICAL HISTORY: Past Medical History:  Diagnosis Date   Alcohol abuse    Anemia    Tobacco dependence     PAST SURGICAL HISTORY: Past Surgical History:  Procedure Laterality Date   ABDOMINAL ADHESION SURGERY     bowel obstruction     x2   CERVICAL  CONIZATION W/BX N/A 11/26/2020   Procedure: CONIZATION CERVIX WITH BIOPSY;  Surgeon: Malachy Mood, MD;  Location: [facility] ORS;  Service: Gynecology;  Laterality: N/A;   ESOPHAGOGASTRODUODENOSCOPY N/A 11/24/2020   Procedure: ESOPHAGOGASTRODUODENOSCOPY (EGD);  Surgeon: Lin Landsman, MD;  Location: [facility] ENDOSCOPY;  Service: Gastroenterology;  Laterality: N/A;   laparoscopic knee surgery     ROUX-EN-Y GASTRIC BYPASS     TEAR DUCT PROBING     unclogg   VAGOTOMY     VENTRICULOPERITONEAL SHUNT     x6 put in and removals    FAMILY HISTORY: Family History  Problem Relation Age of Onset   Cancer Mother    Cancer Father    Cancer Maternal Grandmother     ADVANCED DIRECTIVES (Y/N):  N  HEALTH MAINTENANCE: Social History   Tobacco Use   Smoking status: Former    Types: Cigarettes   Smokeless tobacco: Current  Vaping Use   Vaping Use: Some days  Substance Use Topics   Alcohol use: Not Currently   Drug use: Not Currently     Colonoscopy:  PAP:  Bone density:  Lipid panel:  Allergies  Allergen Reactions   Contrast Media [Iodinated Diagnostic Agents] Hives   Gabapentin Other (See Comments), Rash and Palpitations    Other Reaction: tachycardia Other Reaction: tachycardia    Morphine Dermatitis, Hives, Rash, Swelling and Other (See Comments)    Other reaction(s): Unknown (comments) Has tolerated hydromorphone (Dilaudid) Immediate after injections arm edema and arm turned bright red Immediate after injections arm edema and arm turned bright red IV Morphine IV Morphine    Sumatriptan Dermatitis, Hives, Itching, Other (  See Comments) and Swelling    Other reaction(s): Joint Pain, Other (See Comments), Other (see comments), Unknown (comments) lock jaw Lock jaw Lock jaw Lock jaw TIGHTENING OF JAW Lock jaw lock jaw Lock jaw TIGHTENING OF JAW Lock jaw    Zolpidem Nausea And Vomiting and Other (See Comments)    Other reaction(s): Other (see comments) sleep  walking sleep walking Sleep walking  don't tolerate it well    Erythromycin Diarrhea, Nausea And Vomiting and Nausea Only    Extreme upset stomach    Valproic Acid Rash    Other reaction(s): Other (see comments), Unknown MOOD DISORDER MOOD DISORDER Depakote: Reaction unknown     Acetazolamide     Other reaction(s): Unknown (comments)   Erythromycin Base     Other reaction(s): UNKNOWN   Amoxicillin Rash   Divalproex Sodium Anxiety and Other (See Comments)    Current Outpatient Medications  Medication Sig Dispense Refill   cholecalciferol (VITAMIN D) 25 MCG tablet Take 1 tablet (1,000 Units total) by mouth daily. 30 tablet 1   escitalopram (LEXAPRO) 20 MG tablet Take 1 tablet (20 mg total) by mouth daily at 12 noon. 30 tablet 1   feeding supplement (ENSURE ENLIVE / ENSURE PLUS) LIQD Take 237 mLs by mouth 3 (three) times daily between meals. 02585 mL 0   folic acid (FOLVITE) 1 MG tablet Take 1 tablet (1 mg total) by mouth daily. 30 tablet 0   hydrOXYzine (ATARAX/VISTARIL) 10 MG tablet Take 1 tablet (10 mg total) by mouth 3 (three) times daily as needed for up to 10 days for itching. 30 tablet 0   lamoTRIgine (LAMICTAL) 25 MG tablet Take 2 tablets (50 mg total) by mouth daily. 60 tablet 1   lipase/protease/amylase (CREON) 36000 UNITS CPEP capsule Take 1 capsule (36,000 Units total) by mouth 3 (three) times daily with meals. 180 capsule 1   Multiple Vitamin (MULTIVITAMIN WITH MINERALS) TABS tablet Take 1 tablet by mouth daily.     thiamine 100 MG tablet Take 1 tablet (100 mg total) by mouth daily. 30 tablet 0   vitamin B-12 1000 MCG tablet Take 1 tablet (1,000 mcg total) by mouth daily. 30 tablet 0   No current facility-administered medications for this visit.    OBJECTIVE: There were no vitals filed for this visit.   There is no height or weight on file to calculate BMI.    ECOG FS:0 - Asymptomatic  General: Well-developed, well-nourished, no acute distress. Eyes: Pink  conjunctiva, anicteric sclera. HEENT: Normocephalic, moist mucous membranes. Lungs: No audible wheezing or coughing. Heart: Regular rate and rhythm. Abdomen: Soft, nontender, no obvious distention. Musculoskeletal: No edema, cyanosis, or clubbing. Neuro: Alert, answering all questions appropriately. Cranial nerves grossly intact. Skin: No rashes or petechiae noted. Psych: Normal affect. Lymphatics: No cervical, calvicular, axillary or inguinal LAD.   LAB RESULTS:  Lab Results  Component Value Date   NA 137 11/25/2020   K 3.9 11/26/2020   CL 107 11/25/2020   CO2 25 11/25/2020   GLUCOSE 88 11/25/2020   BUN 6 11/25/2020   CREATININE 0.56 11/25/2020   CALCIUM 8.2 (L) 11/25/2020   PROT 7.4 11/22/2020   ALBUMIN 4.2 11/22/2020   AST 153 (H) 11/22/2020   ALT 76 (H) 11/22/2020   ALKPHOS 73 11/22/2020   BILITOT 0.9 11/22/2020   GFRNONAA >60 11/25/2020    Lab Results  Component Value Date   WBC 5.2 11/26/2020   NEUTROABS 12.0 (H) 11/22/2020   HGB 9.2 (L) 11/26/2020  HCT 30.5 (L) 11/26/2020   MCV 67.5 (L) 11/26/2020   PLT 118 (L) 11/26/2020     STUDIES: DG Chest 2 View  Result Date: 11/22/2020 CLINICAL DATA:  Decreased consciousness EXAM: CHEST - 2 VIEW COMPARISON:  10/22/2015 from [facility] radiology. FINDINGS: Cholecystectomy clips. Midline trachea. Normal heart size and mediastinal contours. No pleural effusion or pneumothorax. Mediastinal surgical clips. Clear lungs. IMPRESSION: No acute cardiopulmonary disease. Electronically Signed   By: Abigail Miyamoto M.D.   On: 11/22/2020 13:33   CT Head Wo Contrast  Result Date: 11/22/2020 CLINICAL DATA:  Head trauma, abnormal mental status (Age 19-64y); Neck trauma, intoxicated or obtunded (Age >= 16y) EXAM: CT HEAD WITHOUT CONTRAST CT CERVICAL SPINE WITHOUT CONTRAST TECHNIQUE: Multidetector CT imaging of the head and cervical spine was performed following the standard protocol without intravenous contrast. Multiplanar CT image  reconstructions of the cervical spine were also generated. COMPARISON:  None. FINDINGS: CT HEAD FINDINGS Brain: No evidence of acute large vascular territory infarction, hemorrhage, hydrocephalus, extra-axial collection or mass lesion/mass effect. Encephalomalacia in the left frontal lobe, extending to the left anterior periventricular white matter. Vascular: No hyperdense vessel identified. Skull: Left frontal and right occipital burr holes. No acute fracture. Sinuses/Orbits: Visualized sinuses are clear. Visualized orbits are unremarkable. Other: No mastoid effusions. CT CERVICAL SPINE FINDINGS Alignment: No substantial sagittal subluxation. Mild broad levocurvature. Skull base and vertebrae: No evidence of acute fracture. Vertebral body heights are maintained. Soft tissues and spinal canal: No prevertebral fluid or swelling. No visible canal hematoma. Approximately 8 mm right thyroid nodule, which is not require further imaging follow-up (ref: J Am Coll Radiol. 2015 Feb;12(2): 143-50). Disc levels: Mild-to-moderate degenerative disease on the left at C6-C7 where there is endplate sclerosis, disc height loss and posterior endplate spurring. Upper chest: Visualized lung apices are clear. IMPRESSION: CT head: 1. No evidence of acute intracranial abnormality. 2. Left frontal lobe encephalomalacia. CT cervical spine: 1. No evidence of acute fracture or traumatic malalignment. 2. Mild-to-moderate C6-C7 degenerative change. Electronically Signed   By: Margaretha Sheffield M.D.   On: 11/22/2020 11:39   CT Cervical Spine Wo Contrast  Result Date: 11/22/2020 CLINICAL DATA:  Head trauma, abnormal mental status (Age 29-64y); Neck trauma, intoxicated or obtunded (Age >= 16y) EXAM: CT HEAD WITHOUT CONTRAST CT CERVICAL SPINE WITHOUT CONTRAST TECHNIQUE: Multidetector CT imaging of the head and cervical spine was performed following the standard protocol without intravenous contrast. Multiplanar CT image reconstructions of the  cervical spine were also generated. COMPARISON:  None. FINDINGS: CT HEAD FINDINGS Brain: No evidence of acute large vascular territory infarction, hemorrhage, hydrocephalus, extra-axial collection or mass lesion/mass effect. Encephalomalacia in the left frontal lobe, extending to the left anterior periventricular white matter. Vascular: No hyperdense vessel identified. Skull: Left frontal and right occipital burr holes. No acute fracture. Sinuses/Orbits: Visualized sinuses are clear. Visualized orbits are unremarkable. Other: No mastoid effusions. CT CERVICAL SPINE FINDINGS Alignment: No substantial sagittal subluxation. Mild broad levocurvature. Skull base and vertebrae: No evidence of acute fracture. Vertebral body heights are maintained. Soft tissues and spinal canal: No prevertebral fluid or swelling. No visible canal hematoma. Approximately 8 mm right thyroid nodule, which is not require further imaging follow-up (ref: J Am Coll Radiol. 2015 Feb;12(2): 143-50). Disc levels: Mild-to-moderate degenerative disease on the left at C6-C7 where there is endplate sclerosis, disc height loss and posterior endplate spurring. Upper chest: Visualized lung apices are clear. IMPRESSION: CT head: 1. No evidence of acute intracranial abnormality. 2. Left frontal lobe encephalomalacia. CT  cervical spine: 1. No evidence of acute fracture or traumatic malalignment. 2. Mild-to-moderate C6-C7 degenerative change. Electronically Signed   By: Margaretha Sheffield M.D.   On: 11/22/2020 11:39   MR PELVIS W WO CONTRAST  Result Date: 11/24/2020 CLINICAL DATA:  Abnormal findings on recent pelvic sonogram. EXAM: MRI PELVIS WITHOUT AND WITH CONTRAST TECHNIQUE: Multiplanar multisequence MR imaging of the pelvis was performed both before and after administration of intravenous contrast. CONTRAST:  68mL GADAVIST GADOBUTROL 1 MMOL/ML IV SOLN COMPARISON:  Pelvic sonogram from November 24, 2020. FINDINGS: Urinary Tract: Urinary bladder is  collapsed with smooth contours. No distal ureteral dilation. Bowel: Bowel with very limited assessment on pelvic MRI performed for cervical and adnexal evaluation. No gross bowel abnormality. Vascular/Lymphatic: Vascular structures are patent as visualized from the internal-external iliac bifurcation through the pelvis. No adenopathy in the imaged portions of the pelvis. Study focused on cervical and ovarian evaluation Reproductive: Uterus is retroflexed with 9.8 x 5.3 x 6.3 cm greatest dimensions. Thickening of the junctional zone is noted diffusely up to 16 mm with normal appearance of the endometrium above the cervical mass that is described below. There is expansion of the endocervical canal with intermediate T2 signal throughout the endocervix with a masslike appearance that measures approximately 2.5 x 2.7 x 3.0 cm. This shows restricted diffusion. On axial images there may be early extension to the edge or just beyond the cervical stroma on image 31 of series 4 though the exam is limited due to patient motion. This area shows marked enhancement as well. Coronal images display greatest abnormality tracking towards the RIGHT cervix and vaginal fornix RIGHT ovary in total measuring approximately 5.0 x 4.6 x 4.5 cm. Simple appearing cysts along the inferior aspect. Along the anterior and superior aspect there are 2 cystic areas that display "T2 shading and elevated signal on T1 intrinsically. Largest measuring 2.3 x 1.8 cm the next largest measuring 19 x 13 mm. Question of septation or serpiginous morphology tracking towards the RIGHT uterine fundus. The LEFT ovary displays a normal appearance additional small focus of increased T1 signal on image 31 of series 9 along the leading edge of more simple appearing cystic areas. No intrinsic T1 signal noted in the region of the cervix. No enhancement within RIGHT cystic ovarian and paraovarian process. Other:  Small free fluid in the pelvis. Musculoskeletal: No  suspicious bone lesions identified. IMPRESSION: Mass in the cervix highly suspicious for cervical cancer with potential early stromal invasion/extension towards the RIGHT parametrium. No gross adenopathy in the pelvis. Pelvic imaging limited to lower pelvis on today's evaluation. Also staging somewhat limited due to motion artifact. No signs of hydroureter No suspicious lesion in the RIGHT ovary with signs of ovarian cysts. Small endometrial implant likely along the leading edge of 1 of the ovarian cysts or the margin of the RIGHT ovary. Suspected hematosalpinx due to endometriosis explaining the more septated appearing finding on the ultrasound. Adenomyosis. Small free fluid in the pelvis. Electronically Signed   By: Zetta Bills M.D.   On: 11/24/2020 19:39   US PELVIS TRANSVAGINAL NON-OB (TV ONLY)  Result Date: 11/24/2020 CLINICAL DATA:  A 45 year old female presents with vaginal bleeding for 3 months, negative pregnancy test. EXAM: TRANSVAGINAL ULTRASOUND OF PELVIS DOPPLER ULTRASOUND OF OVARIES TECHNIQUE: Transvaginal ultrasound examination of the pelvis was performed including evaluation of the uterus, ovaries, adnexal regions, and pelvic cul-de-sac. Color and duplex Doppler ultrasound was utilized to evaluate blood flow to the ovaries. COMPARISON:  Pelvic sonogram from  November 23, 2020, trans abdominal sonogram. FINDINGS: Uterus Measurements: 9.9 x 5.4 x 5.7 cm = volume: 158 mL. Area of the cervix with variable echogenicitya, not well-defined with region measuring perhaps as much as 3.2 x 2.8 cm with mildly bulky appearance. Endometrium not as well assessed due to "[place] blind sign" and globular appearance of the uterus. Endometrium Thickness: 10 mm. Limited assessment of the endometrium not well visualized due to suspected adenomyosis. Right ovary Measurements: 5.6 x 3.7 x 3.6 cm = volume: 38 mL. Multi-cystic appearance of the RIGHT ovary, 1 of these appears to show a septation. Small amount of free  fluid adjacent to the RIGHT ovary. When measured in total cystic areas up to 4.8 x 3.9 x 2.7 cm. Very likely this is multiple adjacent follicles, a 2.7 x 1.6 cm area displays signs of septation. No visible mural nodularity. Left ovary Not visualized. The patient was not able to continue with the examination due to pelvic discomfort. Pulsed Doppler evaluation demonstrates normal low resistance arterial and venous waveforms on the RIGHT. Other findings:  Small free fluid in the pelvis. IMPRESSION: Globular appearance of the uterus with indistinct endometrium. Findings may reflect Morton. Endometrium not well assessed, in the setting of abnormal uterine bleeding MRI may be helpful for further evaluation. Sonohysterogram and biopsy could also be considered as warranted. Indistinct appearance of the cervix with area of decreased echogenicity in the area of the cervix raising the question of cervical mass. MRI may also be helpful in this regard for further evaluation. Multi cystic appearance of the RIGHT ovary favored to represent a collection of follicles. No signs of ovarian torsion at this time. Consider 6-12 week follow-up given the septation. This area could also be further assessed with MRI if performed. Would also correlate with any clinical signs that would suggest PID. LEFT ovary not assessed due to patient discomfort. These results will be called to the ordering clinician or representative by the Radiologist Assistant, and communication documented in the PACS or [employer]. Electronically Signed   By: Zetta Bills M.D.   On: 11/24/2020 10:35   US PELVIS (TRANSABDOMINAL ONLY)  Result Date: 11/23/2020 CLINICAL DATA:  Intermittent irregular vaginal bleeding for 3 months EXAM: TRANSABDOMINAL ULTRASOUND OF PELVIS TECHNIQUE: Transabdominal ultrasound examination of the pelvis was performed including evaluation of the uterus, ovaries, adnexal regions, and pelvic cul-de-sac. Transvaginal imaging was  not ordered. Transabdominal exam was terminated prematurely by patient due to needing to void, with incomplete adnexal assessment. COMPARISON:  None FINDINGS: Uterus Measurements: 9.0 x 5.0 x 5.7 cm = volume: 134 mL. Retroverted. Normal morphology without mass Endometrium Thickness: 9 mm. Small amount of nonspecific endometrial fluid. No discrete endometrial mass. Right ovary Measurements: 5.8 x 3.4 x 3.1 cm = volume: 32 mL. Complex hypoechoic nodule 3.0 cm diameter, question hemorrhagic cyst. Additional complex hypoechoic nodule with internal echogenicity, potentially small corpus luteum. Left ovary Measurements: 3.5 x 2.3 x 1.6 cm = volume: 6.8 mL. Normal morphology without mass Other findings: No free pelvic fluid. No adnexal masses grossly identified. IMPRESSION: Small amount of nonspecific endometrial fluid. Complex hypoechoic nodule RIGHT ovary 3.0 cm diameter, question hemorrhagic cyst, suboptimally assessed; either transvaginal ultrasound characterization now or follow-up ultrasound in 6-12 weeks recommended. Electronically Signed   By: Lavonia Dana M.D.   On: 11/23/2020 18:38   NM PET Image Initial (PI) Skull Base To Thigh  Result Date: 12/06/2020 CLINICAL DATA:  Initial treatment strategy for cervical cancer. EXAM: NUCLEAR MEDICINE PET SKULL BASE  TO THIGH TECHNIQUE: 7.08 mCi F-18 FDG was injected intravenously. Full-ring PET imaging was performed from the skull base to thigh after the radiotracer. CT data was obtained and used for attenuation correction and anatomic localization. Fasting blood glucose: 79 mg/dl COMPARISON:  Pelvic MRI 11/24/2020. Pelvic ultrasound 11/24/2020. Chest CT 05/22/2018 (report only). FINDINGS: Mediastinal blood pool activity: SUV max 1.4 NECK: No hypermetabolic cervical lymph nodes are identified.There are no lesions of the pharyngeal mucosal space. Incidental CT findings: none CHEST: There are no hypermetabolic mediastinal, hilar or axillary lymph nodes. No hypermetabolic  pulmonary activity or suspicious nodularity. Incidental CT findings: Small posterior mediastinal calcifications with postsurgical changes at the gastroesophageal junction. ABDOMEN/PELVIS: There is no hypermetabolic activity within the liver, adrenal glands, spleen or pancreas. There is no hypermetabolic nodal activity. There is hypermetabolic activity within the previously demonstrated cervical mass (SUV max 5.6). There is no extension of this abnormal metabolic activity beyond the cervix. No suspicious adnexal activity. Incidental CT findings: No hydronephrosis. Mild extrahepatic biliary dilatation post cholecystectomy. Postsurgical changes in the stomach suggesting previous gastric bypass procedure. SKELETON: There is no hypermetabolic activity to suggest osseous metastatic disease. Incidental CT findings: none IMPRESSION: 1. The patient's known cervical cancer is hypermetabolic. No evidence of extrauterine extension of tumor, abdominopelvic adenopathy or distant metastases. 2. No hydronephrosis or suspicious adnexal findings. 3. Postsurgical changes as described. Electronically Signed   By: Richardean Sale M.D.   On: 12/06/2020 13:49    ASSESSMENT: Stage IIb adenocarcinoma of the cervix.  PLAN:    Stage IIb adenocarcinoma of the cervix: By report patient is not a surgical candidate, therefore will proceed with concurrent XRT along with weekly chemotherapy using carboplatinum and Taxol.  She also will benefit from adjuvant brachytherapy.  Patient had consultation with radiation oncology earlier today.  PET scan results from December 06, 2020 reviewed independently and reported as above with no obvious evidence of malignancy outside of patient's known cervical cancer.  Return to clinic in 2 weeks for further evaluation and consideration of cycle 1 of weekly carboplatinum and Taxol. Anemia: Improved.  Patient's most recent hemoglobin was 9.2. Thrombocytopenia: Mild, possibly secondary to recent excessive  alcohol use.  Most recent platelet count was 118.  I spent a total of 60 minutes reviewing chart data, face-to-face evaluation with the patient, counseling and coordination of care as detailed above.   Patient expressed understanding and was in agreement with this plan. She also understands that She can call clinic at any time with any questions, concerns, or complaints.    Cancer Staging  Cervical cancer, FIGO stage IIB ([place]) Staging form: Cervix Uteri, AJCC Version 9 - Clinical stage from 12/06/2020: FIGO Stage IIB (cT2b, cN0, cM0) - Signed by Lloyd Huger, MD on 12/06/2020 Stage prefix: Initial diagnosis  Lloyd Huger, MD   12/07/2020 3:17 PM

## 2020-12-07 ENCOUNTER — Inpatient Hospital Stay (HOSPITAL_BASED_OUTPATIENT_CLINIC_OR_DEPARTMENT_OTHER): Payer: Medicare Other | Admitting: Oncology

## 2020-12-07 ENCOUNTER — Encounter: Payer: Self-pay | Admitting: Oncology

## 2020-12-07 ENCOUNTER — Telehealth (INDEPENDENT_AMBULATORY_CARE_PROVIDER_SITE_OTHER): Payer: Self-pay

## 2020-12-07 ENCOUNTER — Encounter: Payer: Self-pay | Admitting: Radiation Oncology

## 2020-12-07 ENCOUNTER — Ambulatory Visit
Admission: RE | Admit: 2020-12-07 | Discharge: 2020-12-07 | Disposition: A | Discharge status: Home / Self Care | Payer: Medicare Other | Source: Ambulatory Visit | Attending: Radiation Oncology | Admitting: Radiation Oncology

## 2020-12-07 VITALS — BP 126/76 | HR 88 | Temp 97.8°F | Wt 133.0 lb

## 2020-12-07 DIAGNOSIS — C539 Malignant neoplasm of cervix uteri, unspecified: Secondary | ICD-10-CM | POA: Insufficient documentation

## 2020-12-07 DIAGNOSIS — Z923 Personal history of irradiation: Secondary | ICD-10-CM | POA: Diagnosis not present

## 2020-12-07 DIAGNOSIS — Z809 Family history of malignant neoplasm, unspecified: Secondary | ICD-10-CM

## 2020-12-07 DIAGNOSIS — Z79899 Other long term (current) drug therapy: Secondary | ICD-10-CM | POA: Insufficient documentation

## 2020-12-07 DIAGNOSIS — D649 Anemia, unspecified: Secondary | ICD-10-CM | POA: Diagnosis not present

## 2020-12-07 DIAGNOSIS — C538 Malignant neoplasm of overlapping sites of cervix uteri: Secondary | ICD-10-CM

## 2020-12-07 DIAGNOSIS — D696 Thrombocytopenia, unspecified: Secondary | ICD-10-CM | POA: Diagnosis not present

## 2020-12-07 DIAGNOSIS — Z87891 Personal history of nicotine dependence: Secondary | ICD-10-CM | POA: Diagnosis not present

## 2020-12-07 MED ORDER — ONDANSETRON HCL 8 MG PO TABS: 8.0000 mg | ORAL_TABLET | Freq: Two times a day (BID) | ORAL | 1 refills | Status: DC | PRN

## 2020-12-07 MED ORDER — LIDOCAINE-PRILOCAINE 2.5-2.5 % EX CREA: TOPICAL_CREAM | CUTANEOUS | 3 refills | Status: DC

## 2020-12-07 MED ORDER — PROCHLORPERAZINE MALEATE 10 MG PO TABS: 10.0000 mg | ORAL_TABLET | Freq: Four times a day (QID) | ORAL | 1 refills | Status: DC | PRN

## 2020-12-07 NOTE — Consult Note (Signed)
NEW PATIENT EVALUATION  Name: Vanessa Romero  MRN: [id number]  Date:   12/07/2020     DOB: October 27, 1975   This 45 y.o. female patient presents to the clinic for initial evaluation of locally advanced  carcinoma of the cervix.  REFERRING PHYSICIAN: Anselmo Pickler*  CHIEF COMPLAINT:  Chief Complaint  Patient presents with   Cervical Cancer    DIAGNOSIS: The encounter diagnosis was Cervical cancer, FIGO stage IIB ([place]).   PREVIOUS INVESTIGATIONS:  PET/CT ultrasound and MRI scans reviewed and Pathology report reviewed Clinical notes reviewed  HPI: Patient is a 45 year old female who presented with anemia.  She reported vaginal bleeding for over a year.  She had a pelvic ultrasound earlier this month showing a 9.9 x 5.4 cm uterus with cervix showing mild bulky appearance measuring approximately 3.2 x 2.8 cm.  MRI scan confirmed mass in the cervix highly suspicious for cervical carcinoma with possible early stromal extension towards the right parametrium.  She did have evidence of ovarian cysts no suspicious lesions in the right ovary.  PET CT scan demonstrated cervical cancer which was hypermetabolic no evidence of extrauterine extension or abdominal pelvic adenopathy or distant metastatic disease.  There was no hydronephrosis or suspicious adnexal findings.  Endocervical and ectocervical Tru-Cut biopsies were positive for high-grade invasive carcinoma extending to the full-thickness of the largest fragments.  Tumor can only be classified as poorly differentiated carcinoma with special stains showing p16 positive with squamous cell markers of cytokeratin 5/6 and p40 were negative.  This all may indicate an adenocarcinoma component.  Based on her exam there was thought to be parametrial involvement and considered a nonsurgical candidate by Dr. Theora Gianotti.  She is now referred to both medical oncology and radiation oncology for opinion.  She is otherwise doing well she does have problems with  weight gain she has had bypass surgery.  PLANNED TREATMENT REGIMEN: External beam radiation therapy to whole pelvis plus intracavity brachytherapy at [facility] and systemic chemotherapy  PAST MEDICAL HISTORY:  has a past medical history of Alcohol abuse, Anemia, and Tobacco dependence.    PAST SURGICAL HISTORY:  Past Surgical History:  Procedure Laterality Date   ABDOMINAL ADHESION SURGERY     bowel obstruction     x2   CERVICAL CONIZATION W/BX N/A 11/26/2020   Procedure: CONIZATION CERVIX WITH BIOPSY;  Surgeon: Malachy Mood, MD;  Location: [facility] ORS;  Service: Gynecology;  Laterality: N/A;   ESOPHAGOGASTRODUODENOSCOPY N/A 11/24/2020   Procedure: ESOPHAGOGASTRODUODENOSCOPY (EGD);  Surgeon: Lin Landsman, MD;  Location: [facility] ENDOSCOPY;  Service: Gastroenterology;  Laterality: N/A;   laparoscopic knee surgery     ROUX-EN-Y GASTRIC BYPASS     TEAR DUCT PROBING     unclogg   VAGOTOMY     VENTRICULOPERITONEAL SHUNT     x6 put in and removals    FAMILY HISTORY: family history includes Cancer in her father, maternal grandmother, and mother.  SOCIAL HISTORY:  reports that she has quit smoking. Her smoking use included cigarettes. She uses smokeless tobacco. She reports that she does not currently use alcohol. She reports that she does not currently use drugs.  ALLERGIES: Contrast media [iodinated diagnostic agents], Gabapentin, Morphine, Sumatriptan, Zolpidem, Erythromycin, Valproic acid, Acetazolamide, Erythromycin base, Amoxicillin, and Divalproex sodium  MEDICATIONS:  Current Outpatient Medications  Medication Sig Dispense Refill   cholecalciferol (VITAMIN D) 25 MCG tablet Take 1 tablet (1,000 Units total) by mouth daily. 30 tablet 1   escitalopram (LEXAPRO) 20 MG tablet Take 1 tablet (20 mg  total) by mouth daily at 12 noon. 30 tablet 1   feeding supplement (ENSURE ENLIVE / ENSURE PLUS) LIQD Take 237 mLs by mouth 3 (three) times daily between meals. 63875 mL 0   folic acid  (FOLVITE) 1 MG tablet Take 1 tablet (1 mg total) by mouth daily. 30 tablet 0   hydrOXYzine (ATARAX/VISTARIL) 10 MG tablet Take 1 tablet (10 mg total) by mouth 3 (three) times daily as needed for up to 10 days for itching. 30 tablet 0   lamoTRIgine (LAMICTAL) 25 MG tablet Take 2 tablets (50 mg total) by mouth daily. 60 tablet 1   lipase/protease/amylase (CREON) 36000 UNITS CPEP capsule Take 1 capsule (36,000 Units total) by mouth 3 (three) times daily with meals. 180 capsule 1   Multiple Vitamin (MULTIVITAMIN WITH MINERALS) TABS tablet Take 1 tablet by mouth daily.     thiamine 100 MG tablet Take 1 tablet (100 mg total) by mouth daily. 30 tablet 0   vitamin B-12 1000 MCG tablet Take 1 tablet (1,000 mcg total) by mouth daily. 30 tablet 0   No current facility-administered medications for this encounter.    ECOG PERFORMANCE STATUS:  1 - Symptomatic but completely ambulatory  REVIEW OF SYSTEMS: Patient denies any weight loss, fatigue, weakness, fever, chills or night sweats. Patient denies any loss of vision, blurred vision. Patient denies any ringing  of the ears or hearing loss. No irregular heartbeat. Patient denies heart murmur or history of fainting. Patient denies any chest pain or pain radiating to her upper extremities. Patient denies any shortness of breath, difficulty breathing at night, cough or hemoptysis. Patient denies any swelling in the lower legs. Patient denies any nausea vomiting, vomiting of blood, or coffee ground material in the vomitus. Patient denies any stomach pain. Patient states has had normal bowel movements no significant constipation or diarrhea. Patient denies any dysuria, hematuria or significant nocturia. Patient denies any problems walking, swelling in the joints or loss of balance. Patient denies any skin changes, loss of hair or loss of weight. Patient denies any excessive worrying or anxiety or significant depression. Patient denies any problems with insomnia. Patient  denies excessive thirst, polyuria, polydipsia. Patient denies any swollen glands, patient denies easy bruising or easy bleeding. Patient denies any recent infections, allergies or URI. Patient "s visual fields have not changed significantly in recent time.   PHYSICAL EXAM: BP 126/76   Pulse 88   Temp 97.8 F (36.6 C) (Tympanic)   Wt 133 lb (60.3 kg)   BMI 20.83 kg/m  Well-developed well-nourished patient in NAD. HEENT reveals PERLA, EOMI, discs not visualized.  Oral cavity is clear. No oral mucosal lesions are identified. Neck is clear without evidence of cervical or supraclavicular adenopathy. Lungs are clear to A&P. Cardiac examination is essentially unremarkable with regular rate and rhythm without murmur rub or thrill. Abdomen is benign with no organomegaly or masses noted. Motor sensory and DTR levels are equal and symmetric in the upper and lower extremities. Cranial nerves II through XII are grossly intact. Proprioception is intact. No peripheral adenopathy or edema is identified. No motor or sensory levels are noted. Crude visual fields are within normal range.  LABORATORY DATA: Pathology report reviewed    RADIOLOGY RESULTS: PET CT scan MRI scans reviewed compatible with above-stated findings   IMPRESSION: Probable stage II high-grade carcinoma of the cervix in 45 year old female  PLAN: At this time I discussed the case with Dr. Theora Gianotti who recommends concurrent chemoradiation.  I would plan on  delivering 48 Gray to her whole pelvis.  Would also make referral to Dr. Christel Mormon at [facility] for consideration of intracavitary brachytherapy.  Risks and benefits of radiation including increased lower urinary tract symptoms diarrhea fatigue alteration of blood count skin reaction all were discussed in detail with the patient.  She seems to comprehend my treatment plan well.  I have spent schedule her for CT simulation f early next week.  I would like to take this opportunity to thank you for allowing  me to participate in the care of your patient...  Noreene Filbert, MD

## 2020-12-07 NOTE — Telephone Encounter (Signed)
I received a secure chat from [place] from the [place] to schedule the patient for a port placement. Patient was called and scheduled with Dr. Lucky Cowboy for 12/20/20 with a  6:45 am arrival time to the [facility]. Pre-procedure instructions were discussed and will be mailed.

## 2020-12-07 NOTE — Progress Notes (Signed)
Pt states she has poor venous access and is requesting a port for future treatment. Pt also c/o poor nutrition with hx of gastric bypass, endorsing unintentional weight loss of 30+ lbs in period of 3-4 months. Pt c/o poor sleep habits and takes benadryl on occasion for sleep. Pt c/o n/v x2-3 days. Pt also wondering about cancer staging.

## 2020-12-07 NOTE — Progress Notes (Signed)
Uterine - No Medical Intervention - Off Treatment.  Patient Characteristics: Endometrioid, Newly Diagnosed (Clinical Staging), Nonsurgical Candidate, Stage I-II Histology: Endometrioid Therapeutic Status: Newly Diagnosed (Clinical Staging) AJCC M Category: cM0 AJCC 8 Stage Grouping: II AJCC T Category: cTX AJCC N Category: AID

## 2020-12-07 NOTE — Progress Notes (Signed)
START OFF PATHWAY REGIMEN - Uterine   [id number]:Carboplatin AUC 2 + Paclitaxel 50 mg/m2 weekly + RT:   Administer weekly during RT:     Paclitaxel      Carboplatin   **Always confirm dose/schedule in your pharmacy ordering system**  Patient Characteristics: Endometrioid, Newly Diagnosed (Clinical Staging), Nonsurgical Candidate, Stage I-II Histology: Endometrioid Therapeutic Status: Newly Diagnosed (Clinical Staging) AJCC M Category: cM0 AJCC 8 Stage Grouping: II AJCC T Category: cTX AJCC N Category: cNX Intent of Therapy: Curative Intent, Discussed with Patient

## 2020-12-08 ENCOUNTER — Encounter: Payer: Self-pay | Admitting: Oncology

## 2020-12-13 ENCOUNTER — Ambulatory Visit
Admission: RE | Admit: 2020-12-13 | Discharge: 2020-12-13 | Disposition: A | Discharge status: Home / Self Care | Payer: Medicare Other | Source: Ambulatory Visit | Attending: Radiation Oncology | Admitting: Radiation Oncology

## 2020-12-13 DIAGNOSIS — C539 Malignant neoplasm of cervix uteri, unspecified: Secondary | ICD-10-CM | POA: Insufficient documentation

## 2020-12-13 LAB — SURGICAL PATHOLOGY

## 2020-12-14 DIAGNOSIS — C539 Malignant neoplasm of cervix uteri, unspecified: Secondary | ICD-10-CM | POA: Diagnosis not present

## 2020-12-16 ENCOUNTER — Inpatient Hospital Stay: Payer: Medicare Other

## 2020-12-16 ENCOUNTER — Other Ambulatory Visit: Payer: Self-pay

## 2020-12-16 DIAGNOSIS — C539 Malignant neoplasm of cervix uteri, unspecified: Secondary | ICD-10-CM | POA: Insufficient documentation

## 2020-12-16 DIAGNOSIS — Z5111 Encounter for antineoplastic chemotherapy: Secondary | ICD-10-CM | POA: Insufficient documentation

## 2020-12-16 DIAGNOSIS — Z79899 Other long term (current) drug therapy: Secondary | ICD-10-CM | POA: Insufficient documentation

## 2020-12-17 NOTE — Progress Notes (Signed)
[place]  [phone] [phone]) [phone]  ID: Vanessa Romero OB: 1975/11/17  MR#: [id number]  IOX#:[id number]  Patient Care Team: Anselmo Pickler, MD as PCP - General (Internal Medicine)  CHIEF COMPLAINT: Stage IIb adenocarcinoma of the cervix.  INTERVAL HISTORY: Patient returns to clinic today for further evaluation and consideration of cycle 1 of weekly carboplatinum and Taxol.  She will initiate XRT tomorrow.  She has tenderness at the site of her port, but otherwise feels well.  She has no neurologic complaints.  She denies any recent fevers or illnesses.  She has a good appetite and denies weight loss. She has no chest pain, shortness of breath, cough, or hemoptysis.  She denies any nausea, vomiting, constipation, or diarrhea.  She has no urinary complaints.  Patient offers no further specific complaints today.  REVIEW OF SYSTEMS:   Review of Systems  Constitutional: Negative.  Negative for fever, malaise/fatigue and weight loss.  Respiratory: Negative.  Negative for cough, hemoptysis and shortness of breath.   Cardiovascular: Negative.  Negative for chest pain and leg swelling.  Gastrointestinal: Negative.  Negative for abdominal pain.  Genitourinary: Negative.  Negative for dysuria.  Musculoskeletal: Negative.  Negative for back pain.  Skin: Negative.  Negative for rash.  Neurological: Negative.  Negative for dizziness, focal weakness, weakness and headaches.  Psychiatric/Behavioral: Negative.  The patient is not nervous/anxious.    As per HPI. Otherwise, a complete review of systems is negative.  PAST MEDICAL HISTORY: Past Medical History:  Diagnosis Date   Alcohol abuse    Anemia    Cancer ([place])    Tobacco dependence     PAST SURGICAL HISTORY: Past Surgical History:  Procedure Laterality Date   ABDOMINAL ADHESION SURGERY     bowel obstruction     x2   CERVICAL CONIZATION W/BX N/A 11/26/2020   Procedure: CONIZATION CERVIX  WITH BIOPSY;  Surgeon: Malachy Mood, MD;  Location: [facility] ORS;  Service: Gynecology;  Laterality: N/A;   ESOPHAGOGASTRODUODENOSCOPY N/A 11/24/2020   Procedure: ESOPHAGOGASTRODUODENOSCOPY (EGD);  Surgeon: Lin Landsman, MD;  Location: [facility] ENDOSCOPY;  Service: Gastroenterology;  Laterality: N/A;   laparoscopic knee surgery     PORTA CATH INSERTION N/A 12/20/2020   Procedure: PORTA CATH INSERTION;  Surgeon: Algernon Huxley, MD;  Location: [facility];  Service: Cardiovascular;  Laterality: N/A;   ROUX-EN-Y GASTRIC BYPASS     TEAR DUCT PROBING     unclogg   VAGOTOMY     VENTRICULOPERITONEAL SHUNT     x6 put in and removals    FAMILY HISTORY: Family History  Problem Relation Age of Onset   Cancer Mother    Cancer Father    Cancer Maternal Grandmother     ADVANCED DIRECTIVES (Y/N):  N  HEALTH MAINTENANCE: Social History   Tobacco Use   Smoking status: Former    Types: Cigarettes   Smokeless tobacco: Current  Vaping Use   Vaping Use: Some days  Substance Use Topics   Alcohol use: Not Currently   Drug use: Not Currently     Colonoscopy:  PAP:  Bone density:  Lipid panel:  Allergies  Allergen Reactions   Contrast Media [Iodinated Diagnostic Agents] Hives   Gabapentin Other (See Comments), Rash and Palpitations    Other Reaction: tachycardia Other Reaction: tachycardia    Morphine Dermatitis, Hives, Rash, Swelling and Other (See Comments)    Other reaction(s): Unknown (comments) Has tolerated hydromorphone (Dilaudid) Immediate after injections arm edema and arm turned bright  red Immediate after injections arm edema and arm turned bright red IV Morphine IV Morphine    Sumatriptan Dermatitis, Hives, Itching, Other (See Comments) and Swelling    Other reaction(s): Joint Pain, Other (See Comments), Other (see comments), Unknown (comments) lock jaw Lock jaw Lock jaw Lock jaw TIGHTENING OF JAW Lock jaw lock jaw Lock jaw TIGHTENING OF JAW Lock jaw     Zolpidem Nausea And Vomiting and Other (See Comments)    Other reaction(s): Other (see comments) sleep walking sleep walking Sleep walking  don't tolerate it well    Erythromycin Diarrhea, Nausea And Vomiting and Nausea Only    Extreme upset stomach    Valproic Acid Rash    Other reaction(s): Other (see comments), Unknown MOOD DISORDER MOOD DISORDER Depakote: Reaction unknown     Acetazolamide     Other reaction(s): Unknown (comments)   Erythromycin Base     Other reaction(s): UNKNOWN   Amoxicillin Rash   Divalproex Sodium Anxiety and Other (See Comments)    Current Outpatient Medications  Medication Sig Dispense Refill   cholecalciferol (VITAMIN D) 25 MCG tablet Take 1 tablet (1,000 Units total) by mouth daily. 30 tablet 1   escitalopram (LEXAPRO) 20 MG tablet Take 1 tablet (20 mg total) by mouth daily at 12 noon. 30 tablet 1   feeding supplement (ENSURE ENLIVE / ENSURE PLUS) LIQD Take 237 mLs by mouth 3 (three) times daily between meals. 16967 mL 0   folic acid (FOLVITE) 1 MG tablet Take 1 tablet (1 mg total) by mouth daily. 30 tablet 0   lamoTRIgine (LAMICTAL) 25 MG tablet Take 2 tablets (50 mg total) by mouth daily. 60 tablet 1   lidocaine-prilocaine (EMLA) cream Apply to affected area once 30 g 3   lipase/protease/amylase (CREON) 36000 UNITS CPEP capsule Take 1 capsule (36,000 Units total) by mouth 3 (three) times daily with meals. 180 capsule 1   Multiple Vitamin (MULTIVITAMIN WITH MINERALS) TABS tablet Take 1 tablet by mouth daily.     ondansetron (ZOFRAN) 8 MG tablet Take 1 tablet (8 mg total) by mouth 2 (two) times daily as needed for refractory nausea / vomiting. 60 tablet 1   prochlorperazine (COMPAZINE) 10 MG tablet Take 1 tablet (10 mg total) by mouth every 6 (six) hours as needed (Nausea or vomiting). 60 tablet 1   thiamine 100 MG tablet Take 1 tablet (100 mg total) by mouth daily. 30 tablet 0   traMADol (ULTRAM) 50 MG tablet Take 1 tablet (50 mg total) by mouth  every 6 (six) hours as needed. 30 tablet 0   vitamin B-12 1000 MCG tablet Take 1 tablet (1,000 mcg total) by mouth daily. 30 tablet 0   No current facility-administered medications for this visit.   Facility-Administered Medications Ordered in Other Visits  Medication Dose Route Frequency Provider Last Rate Last Admin   CARBOplatin (PARAPLATIN) 220 mg in sodium chloride 0.9 % 100 mL chemo infusion  220 mg Intravenous Once Lloyd Huger, MD        OBJECTIVE: Vitals:   12/21/20 0855  BP: 113/84  Pulse: 81  Resp: 16  Temp: (!) 97.1 F (36.2 C)  SpO2: 98%     Body mass index is 20.36 kg/m.    ECOG FS:0 - Asymptomatic  General: Well-developed, well-nourished, no acute distress. Eyes: Pink conjunctiva, anicteric sclera. HEENT: Normocephalic, moist mucous membranes. Lungs: No audible wheezing or coughing. Heart: Regular rate and rhythm. Abdomen: Soft, nontender, no obvious distention. Musculoskeletal: No edema, cyanosis, or clubbing.  Neuro: Alert, answering all questions appropriately. Cranial nerves grossly intact. Skin: No rashes or petechiae noted. Psych: Normal affect.  LAB RESULTS:  Lab Results  Component Value Date   NA 135 12/21/2020   K 3.7 12/21/2020   CL 106 12/21/2020   CO2 19 (L) 12/21/2020   GLUCOSE 87 12/21/2020   BUN 14 12/21/2020   CREATININE 0.57 12/21/2020   CALCIUM 8.5 (L) 12/21/2020   PROT 6.5 12/21/2020   ALBUMIN 3.6 12/21/2020   AST 25 12/21/2020   ALT 18 12/21/2020   ALKPHOS 51 12/21/2020   BILITOT 0.3 12/21/2020   GFRNONAA >60 12/21/2020    Lab Results  Component Value Date   WBC 8.6 12/21/2020   NEUTROABS 5.3 12/21/2020   HGB 11.0 (L) 12/21/2020   HCT 34.5 (L) 12/21/2020   MCV 75.5 (L) 12/21/2020   PLT 210 12/21/2020     STUDIES: DG Chest 2 View  Result Date: 11/22/2020 CLINICAL DATA:  Decreased consciousness EXAM: CHEST - 2 VIEW COMPARISON:  10/22/2015 from [facility] radiology. FINDINGS: Cholecystectomy clips. Midline trachea.  Normal heart size and mediastinal contours. No pleural effusion or pneumothorax. Mediastinal surgical clips. Clear lungs. IMPRESSION: No acute cardiopulmonary disease. Electronically Signed   By: Abigail Miyamoto M.D.   On: 11/22/2020 13:33   CT Head Wo Contrast  Result Date: 11/22/2020 CLINICAL DATA:  Head trauma, abnormal mental status (Age 65-64y); Neck trauma, intoxicated or obtunded (Age >= 16y) EXAM: CT HEAD WITHOUT CONTRAST CT CERVICAL SPINE WITHOUT CONTRAST TECHNIQUE: Multidetector CT imaging of the head and cervical spine was performed following the standard protocol without intravenous contrast. Multiplanar CT image reconstructions of the cervical spine were also generated. COMPARISON:  None. FINDINGS: CT HEAD FINDINGS Brain: No evidence of acute large vascular territory infarction, hemorrhage, hydrocephalus, extra-axial collection or mass lesion/mass effect. Encephalomalacia in the left frontal lobe, extending to the left anterior periventricular white matter. Vascular: No hyperdense vessel identified. Skull: Left frontal and right occipital burr holes. No acute fracture. Sinuses/Orbits: Visualized sinuses are clear. Visualized orbits are unremarkable. Other: No mastoid effusions. CT CERVICAL SPINE FINDINGS Alignment: No substantial sagittal subluxation. Mild broad levocurvature. Skull base and vertebrae: No evidence of acute fracture. Vertebral body heights are maintained. Soft tissues and spinal canal: No prevertebral fluid or swelling. No visible canal hematoma. Approximately 8 mm right thyroid nodule, which is not require further imaging follow-up (ref: J Am Coll Radiol. 2015 Feb;12(2): 143-50). Disc levels: Mild-to-moderate degenerative disease on the left at C6-C7 where there is endplate sclerosis, disc height loss and posterior endplate spurring. Upper chest: Visualized lung apices are clear. IMPRESSION: CT head: 1. No evidence of acute intracranial abnormality. 2. Left frontal lobe  encephalomalacia. CT cervical spine: 1. No evidence of acute fracture or traumatic malalignment. 2. Mild-to-moderate C6-C7 degenerative change. Electronically Signed   By: Margaretha Sheffield M.D.   On: 11/22/2020 11:39   CT Cervical Spine Wo Contrast  Result Date: 11/22/2020 CLINICAL DATA:  Head trauma, abnormal mental status (Age 47-64y); Neck trauma, intoxicated or obtunded (Age >= 16y) EXAM: CT HEAD WITHOUT CONTRAST CT CERVICAL SPINE WITHOUT CONTRAST TECHNIQUE: Multidetector CT imaging of the head and cervical spine was performed following the standard protocol without intravenous contrast. Multiplanar CT image reconstructions of the cervical spine were also generated. COMPARISON:  None. FINDINGS: CT HEAD FINDINGS Brain: No evidence of acute large vascular territory infarction, hemorrhage, hydrocephalus, extra-axial collection or mass lesion/mass effect. Encephalomalacia in the left frontal lobe, extending to the left anterior periventricular white matter. Vascular: No hyperdense  vessel identified. Skull: Left frontal and right occipital burr holes. No acute fracture. Sinuses/Orbits: Visualized sinuses are clear. Visualized orbits are unremarkable. Other: No mastoid effusions. CT CERVICAL SPINE FINDINGS Alignment: No substantial sagittal subluxation. Mild broad levocurvature. Skull base and vertebrae: No evidence of acute fracture. Vertebral body heights are maintained. Soft tissues and spinal canal: No prevertebral fluid or swelling. No visible canal hematoma. Approximately 8 mm right thyroid nodule, which is not require further imaging follow-up (ref: J Am Coll Radiol. 2015 Feb;12(2): 143-50). Disc levels: Mild-to-moderate degenerative disease on the left at C6-C7 where there is endplate sclerosis, disc height loss and posterior endplate spurring. Upper chest: Visualized lung apices are clear. IMPRESSION: CT head: 1. No evidence of acute intracranial abnormality. 2. Left frontal lobe encephalomalacia. CT  cervical spine: 1. No evidence of acute fracture or traumatic malalignment. 2. Mild-to-moderate C6-C7 degenerative change. Electronically Signed   By: Margaretha Sheffield M.D.   On: 11/22/2020 11:39   MR PELVIS W WO CONTRAST  Result Date: 11/24/2020 CLINICAL DATA:  Abnormal findings on recent pelvic sonogram. EXAM: MRI PELVIS WITHOUT AND WITH CONTRAST TECHNIQUE: Multiplanar multisequence MR imaging of the pelvis was performed both before and after administration of intravenous contrast. CONTRAST:  59mL GADAVIST GADOBUTROL 1 MMOL/ML IV SOLN COMPARISON:  Pelvic sonogram from November 24, 2020. FINDINGS: Urinary Tract: Urinary bladder is collapsed with smooth contours. No distal ureteral dilation. Bowel: Bowel with very limited assessment on pelvic MRI performed for cervical and adnexal evaluation. No gross bowel abnormality. Vascular/Lymphatic: Vascular structures are patent as visualized from the internal-external iliac bifurcation through the pelvis. No adenopathy in the imaged portions of the pelvis. Study focused on cervical and ovarian evaluation Reproductive: Uterus is retroflexed with 9.8 x 5.3 x 6.3 cm greatest dimensions. Thickening of the junctional zone is noted diffusely up to 16 mm with normal appearance of the endometrium above the cervical mass that is described below. There is expansion of the endocervical canal with intermediate T2 signal throughout the endocervix with a masslike appearance that measures approximately 2.5 x 2.7 x 3.0 cm. This shows restricted diffusion. On axial images there may be early extension to the edge or just beyond the cervical stroma on image 31 of series 4 though the exam is limited due to patient motion. This area shows marked enhancement as well. Coronal images display greatest abnormality tracking towards the RIGHT cervix and vaginal fornix RIGHT ovary in total measuring approximately 5.0 x 4.6 x 4.5 cm. Simple appearing cysts along the inferior aspect. Along the  anterior and superior aspect there are 2 cystic areas that display "T2 shading and elevated signal on T1 intrinsically. Largest measuring 2.3 x 1.8 cm the next largest measuring 19 x 13 mm. Question of septation or serpiginous morphology tracking towards the RIGHT uterine fundus. The LEFT ovary displays a normal appearance additional small focus of increased T1 signal on image 31 of series 9 along the leading edge of more simple appearing cystic areas. No intrinsic T1 signal noted in the region of the cervix. No enhancement within RIGHT cystic ovarian and paraovarian process. Other:  Small free fluid in the pelvis. Musculoskeletal: No suspicious bone lesions identified. IMPRESSION: Mass in the cervix highly suspicious for cervical cancer with potential early stromal invasion/extension towards the RIGHT parametrium. No gross adenopathy in the pelvis. Pelvic imaging limited to lower pelvis on today's evaluation. Also staging somewhat limited due to motion artifact. No signs of hydroureter No suspicious lesion in the RIGHT ovary with signs of ovarian cysts.  Small endometrial implant likely along the leading edge of 1 of the ovarian cysts or the margin of the RIGHT ovary. Suspected hematosalpinx due to endometriosis explaining the more septated appearing finding on the ultrasound. Adenomyosis. Small free fluid in the pelvis. Electronically Signed   By: Zetta Bills M.D.   On: 11/24/2020 19:39   US PELVIS TRANSVAGINAL NON-OB (TV ONLY)  Result Date: 11/24/2020 CLINICAL DATA:  A 45 year old female presents with vaginal bleeding for 3 months, negative pregnancy test. EXAM: TRANSVAGINAL ULTRASOUND OF PELVIS DOPPLER ULTRASOUND OF OVARIES TECHNIQUE: Transvaginal ultrasound examination of the pelvis was performed including evaluation of the uterus, ovaries, adnexal regions, and pelvic cul-de-sac. Color and duplex Doppler ultrasound was utilized to evaluate blood flow to the ovaries. COMPARISON:  Pelvic sonogram from  November 23, 2020, trans abdominal sonogram. FINDINGS: Uterus Measurements: 9.9 x 5.4 x 5.7 cm = volume: 158 mL. Area of the cervix with variable echogenicitya, not well-defined with region measuring perhaps as much as 3.2 x 2.8 cm with mildly bulky appearance. Endometrium not as well assessed due to "[place] blind sign" and globular appearance of the uterus. Endometrium Thickness: 10 mm. Limited assessment of the endometrium not well visualized due to suspected adenomyosis. Right ovary Measurements: 5.6 x 3.7 x 3.6 cm = volume: 38 mL. Multi-cystic appearance of the RIGHT ovary, 1 of these appears to show a septation. Small amount of free fluid adjacent to the RIGHT ovary. When measured in total cystic areas up to 4.8 x 3.9 x 2.7 cm. Very likely this is multiple adjacent follicles, a 2.7 x 1.6 cm area displays signs of septation. No visible mural nodularity. Left ovary Not visualized. The patient was not able to continue with the examination due to pelvic discomfort. Pulsed Doppler evaluation demonstrates normal low resistance arterial and venous waveforms on the RIGHT. Other findings:  Small free fluid in the pelvis. IMPRESSION: Globular appearance of the uterus with indistinct endometrium. Findings may reflect [place]. Endometrium not well assessed, in the setting of abnormal uterine bleeding MRI may be helpful for further evaluation. Sonohysterogram and biopsy could also be considered as warranted. Indistinct appearance of the cervix with area of decreased echogenicity in the area of the cervix raising the question of cervical mass. MRI may also be helpful in this regard for further evaluation. Multi cystic appearance of the RIGHT ovary favored to represent a collection of follicles. No signs of ovarian torsion at this time. Consider 6-12 week follow-up given the septation. This area could also be further assessed with MRI if performed. Would also correlate with any clinical signs that would suggest PID.  LEFT ovary not assessed due to patient discomfort. These results will be called to the ordering clinician or representative by the Radiologist Assistant, and communication documented in the PACS or [employer]. Electronically Signed   By: Zetta Bills M.D.   On: 11/24/2020 10:35   US PELVIS (TRANSABDOMINAL ONLY)  Result Date: 11/23/2020 CLINICAL DATA:  Intermittent irregular vaginal bleeding for 3 months EXAM: TRANSABDOMINAL ULTRASOUND OF PELVIS TECHNIQUE: Transabdominal ultrasound examination of the pelvis was performed including evaluation of the uterus, ovaries, adnexal regions, and pelvic cul-de-sac. Transvaginal imaging was not ordered. Transabdominal exam was terminated prematurely by patient due to needing to void, with incomplete adnexal assessment. COMPARISON:  None FINDINGS: Uterus Measurements: 9.0 x 5.0 x 5.7 cm = volume: 134 mL. Retroverted. Normal morphology without mass Endometrium Thickness: 9 mm. Small amount of nonspecific endometrial fluid. No discrete endometrial mass. Right ovary Measurements: 5.8 x  3.4 x 3.1 cm = volume: 32 mL. Complex hypoechoic nodule 3.0 cm diameter, question hemorrhagic cyst. Additional complex hypoechoic nodule with internal echogenicity, potentially small corpus luteum. Left ovary Measurements: 3.5 x 2.3 x 1.6 cm = volume: 6.8 mL. Normal morphology without mass Other findings: No free pelvic fluid. No adnexal masses grossly identified. IMPRESSION: Small amount of nonspecific endometrial fluid. Complex hypoechoic nodule RIGHT ovary 3.0 cm diameter, question hemorrhagic cyst, suboptimally assessed; either transvaginal ultrasound characterization now or follow-up ultrasound in 6-12 weeks recommended. Electronically Signed   By: Lavonia Dana M.D.   On: 11/23/2020 18:38   PERIPHERAL VASCULAR CATHETERIZATION  Result Date: 12/20/2020 See surgical note for result.  NM PET Image Initial (PI) Skull Base To Thigh  Result Date: 12/06/2020 CLINICAL DATA:  Initial  treatment strategy for cervical cancer. EXAM: NUCLEAR MEDICINE PET SKULL BASE TO THIGH TECHNIQUE: 7.08 mCi F-18 FDG was injected intravenously. Full-ring PET imaging was performed from the skull base to thigh after the radiotracer. CT data was obtained and used for attenuation correction and anatomic localization. Fasting blood glucose: 79 mg/dl COMPARISON:  Pelvic MRI 11/24/2020. Pelvic ultrasound 11/24/2020. Chest CT 05/22/2018 (report only). FINDINGS: Mediastinal blood pool activity: SUV max 1.4 NECK: No hypermetabolic cervical lymph nodes are identified.There are no lesions of the pharyngeal mucosal space. Incidental CT findings: none CHEST: There are no hypermetabolic mediastinal, hilar or axillary lymph nodes. No hypermetabolic pulmonary activity or suspicious nodularity. Incidental CT findings: Small posterior mediastinal calcifications with postsurgical changes at the gastroesophageal junction. ABDOMEN/PELVIS: There is no hypermetabolic activity within the liver, adrenal glands, spleen or pancreas. There is no hypermetabolic nodal activity. There is hypermetabolic activity within the previously demonstrated cervical mass (SUV max 5.6). There is no extension of this abnormal metabolic activity beyond the cervix. No suspicious adnexal activity. Incidental CT findings: No hydronephrosis. Mild extrahepatic biliary dilatation post cholecystectomy. Postsurgical changes in the stomach suggesting previous gastric bypass procedure. SKELETON: There is no hypermetabolic activity to suggest osseous metastatic disease. Incidental CT findings: none IMPRESSION: 1. The patient's known cervical cancer is hypermetabolic. No evidence of extrauterine extension of tumor, abdominopelvic adenopathy or distant metastases. 2. No hydronephrosis or suspicious adnexal findings. 3. Postsurgical changes as described. Electronically Signed   By: Richardean Sale M.D.   On: 12/06/2020 13:49    ASSESSMENT: Stage IIb adenocarcinoma of the  cervix.  PLAN:    Stage IIb adenocarcinoma of the cervix: By report patient is not a surgical candidate, therefore will proceed with concurrent XRT along with weekly chemotherapy using carboplatinum and Taxol.  She also will benefit from adjuvant brachytherapy.  PET scan results from December 06, 2020 reviewed independently and reported as above with no obvious evidence of malignancy outside of patient's known cervical cancer.  Proceed with cycle 1 of weekly carboplatinum and Taxol today.  Patient will initiate XRT tomorrow.  Return to clinic in 1 week for further evaluation and consideration of cycle 2.   Anemia: Improving.  Patient's hemoglobin is 11.0. Thrombocytopenia: Resolved. Port tenderness: Patient was given a prescription for tramadol.  Patient expressed understanding and was in agreement with this plan. She also understands that She can call clinic at any time with any questions, concerns, or complaints.    Cancer Staging  Cervical cancer, FIGO stage IIB ([place]) Staging form: Cervix Uteri, AJCC Version 9 - Clinical stage from 12/06/2020: FIGO Stage IIB (cT2b, cN0, cM0) - Signed by Lloyd Huger, MD on 12/06/2020 Stage prefix: Initial diagnosis  Lloyd Huger, MD  12/21/2020 12:35 PM

## 2020-12-20 ENCOUNTER — Other Ambulatory Visit: Payer: Self-pay | Admitting: Oncology

## 2020-12-20 ENCOUNTER — Encounter
Admission: RE | Disposition: A | Discharge status: Home / Self Care | Payer: Self-pay | Source: Home / Self Care | Attending: Vascular Surgery

## 2020-12-20 ENCOUNTER — Encounter: Payer: Self-pay | Admitting: Emergency Medicine

## 2020-12-20 ENCOUNTER — Telehealth: Payer: Self-pay | Admitting: *Deleted

## 2020-12-20 ENCOUNTER — Encounter: Payer: Self-pay | Admitting: Vascular Surgery

## 2020-12-20 ENCOUNTER — Ambulatory Visit
Admission: RE | Admit: 2020-12-20 | Discharge: 2020-12-20 | Disposition: A | Discharge status: Home / Self Care | Payer: Medicare Other | Attending: Vascular Surgery | Admitting: Vascular Surgery

## 2020-12-20 ENCOUNTER — Ambulatory Visit: Payer: Medicare Other

## 2020-12-20 ENCOUNTER — Other Ambulatory Visit: Payer: Self-pay | Admitting: *Deleted

## 2020-12-20 ENCOUNTER — Other Ambulatory Visit (INDEPENDENT_AMBULATORY_CARE_PROVIDER_SITE_OTHER): Payer: Self-pay | Admitting: Nurse Practitioner

## 2020-12-20 ENCOUNTER — Other Ambulatory Visit: Payer: Self-pay | Admitting: Emergency Medicine

## 2020-12-20 ENCOUNTER — Other Ambulatory Visit: Payer: Self-pay

## 2020-12-20 DIAGNOSIS — D649 Anemia, unspecified: Secondary | ICD-10-CM | POA: Diagnosis not present

## 2020-12-20 DIAGNOSIS — C539 Malignant neoplasm of cervix uteri, unspecified: Secondary | ICD-10-CM

## 2020-12-20 DIAGNOSIS — D696 Thrombocytopenia, unspecified: Secondary | ICD-10-CM | POA: Insufficient documentation

## 2020-12-20 HISTORY — PX: PORTA CATH INSERTION: CATH118285

## 2020-12-20 HISTORY — DX: Malignant (primary) neoplasm, unspecified: C80.1

## 2020-12-20 SURGERY — PORTA CATH INSERTION
Anesthesia: Moderate Sedation

## 2020-12-20 MED ORDER — MIDAZOLAM HCL 2 MG/2ML IJ SOLN
INTRAMUSCULAR | Status: AC
  Filled 2020-12-20: qty 2

## 2020-12-20 MED ORDER — SODIUM CHLORIDE 0.9 % IV SOLN
Freq: Once | INTRAVENOUS | Status: DC
  Filled 2020-12-20: qty 2

## 2020-12-20 MED ORDER — FENTANYL CITRATE PF 50 MCG/ML IJ SOSY
PREFILLED_SYRINGE | INTRAMUSCULAR | Status: AC
  Filled 2020-12-20: qty 1

## 2020-12-20 MED ORDER — CHLORHEXIDINE GLUCONATE CLOTH 2 % EX PADS
6.0000 | MEDICATED_PAD | Freq: Every day | CUTANEOUS | Status: DC
  Administered 2020-12-20: 6 via TOPICAL

## 2020-12-20 MED ORDER — MIDAZOLAM HCL 2 MG/2ML IJ SOLN
INTRAMUSCULAR | Status: DC | PRN
  Administered 2020-12-20: 1 mg via INTRAVENOUS
  Administered 2020-12-20: 2 mg via INTRAVENOUS

## 2020-12-20 MED ORDER — ONDANSETRON HCL 4 MG/2ML IJ SOLN: 4.0000 mg | Freq: Four times a day (QID) | INTRAMUSCULAR | Status: DC | PRN

## 2020-12-20 MED ORDER — CLINDAMYCIN PHOSPHATE 300 MG/50ML IV SOLN
INTRAVENOUS | Status: AC
  Administered 2020-12-20: 300 mg via INTRAVENOUS
  Filled 2020-12-20: qty 50

## 2020-12-20 MED ORDER — TRAMADOL HCL 50 MG PO TABS
50.0000 mg | ORAL_TABLET | Freq: Four times a day (QID) | ORAL | 0 refills | Status: DC | PRN
Start: 2020-12-20 — End: 2020-12-20

## 2020-12-20 MED ORDER — MIDAZOLAM HCL 2 MG/ML PO SYRP: 8.0000 mg | ORAL_SOLUTION | Freq: Once | ORAL | Status: DC | PRN

## 2020-12-20 MED ORDER — TRAMADOL HCL 50 MG PO TABS: 50.0000 mg | ORAL_TABLET | Freq: Four times a day (QID) | ORAL | 0 refills | Status: DC | PRN

## 2020-12-20 MED ORDER — FENTANYL CITRATE (PF) 100 MCG/2ML IJ SOLN: 12.5000 ug | Freq: Once | INTRAMUSCULAR | Status: DC | PRN

## 2020-12-20 MED ORDER — FENTANYL CITRATE (PF) 100 MCG/2ML IJ SOLN
INTRAMUSCULAR | Status: DC | PRN
  Administered 2020-12-20 (×2): 50 ug via INTRAVENOUS

## 2020-12-20 MED ORDER — SODIUM CHLORIDE 0.9 % IV SOLN: INTRAVENOUS | Status: DC

## 2020-12-20 MED ORDER — METHYLPREDNISOLONE SODIUM SUCC 125 MG IJ SOLR: 125.0000 mg | Freq: Once | INTRAMUSCULAR | Status: DC | PRN

## 2020-12-20 MED ORDER — DIPHENHYDRAMINE HCL 50 MG/ML IJ SOLN: 50.0000 mg | Freq: Once | INTRAMUSCULAR | Status: DC | PRN

## 2020-12-20 MED ORDER — CLINDAMYCIN PHOSPHATE 300 MG/50ML IV SOLN
INTRAVENOUS | Status: AC
  Filled 2020-12-20: qty 50

## 2020-12-20 MED ORDER — CLINDAMYCIN PHOSPHATE 300 MG/50ML IV SOLN: 300.0000 mg | Freq: Once | INTRAVENOUS | Status: AC

## 2020-12-20 MED ORDER — FAMOTIDINE 20 MG PO TABS: 40.0000 mg | ORAL_TABLET | Freq: Once | ORAL | Status: DC | PRN

## 2020-12-20 SURGICAL SUPPLY — 12 items
ADH SKN CLS APL DERMABOND .7 (GAUZE/BANDAGES/DRESSINGS) ×1
COVER PROBE U/S 5X48 (MISCELLANEOUS) ×2 IMPLANT
COVER SURGICAL LIGHT HANDLE (MISCELLANEOUS) ×2 IMPLANT
DERMABOND ADVANCED (GAUZE/BANDAGES/DRESSINGS) ×1
DERMABOND ADVANCED .7 DNX12 (GAUZE/BANDAGES/DRESSINGS) ×1 IMPLANT
KIT PORT POWER 8FR ISP CVUE (Port) ×2 IMPLANT
PACK ANGIOGRAPHY (CUSTOM PROCEDURE TRAY) ×2 IMPLANT
PENCIL ELECTRO HAND CTR (MISCELLANEOUS) ×2 IMPLANT
SPONGE XRAY 4X4 16PLY STRL (MISCELLANEOUS) ×2 IMPLANT
SUT MNCRL AB 4-0 PS2 18 (SUTURE) ×2 IMPLANT
SUT VIC AB 3-0 SH 27 (SUTURE) ×2
SUT VIC AB 3-0 SH 27X BRD (SUTURE) ×1 IMPLANT

## 2020-12-20 NOTE — Telephone Encounter (Signed)
Patient called reporting that she had her port inserted today and she has nothing for pain. Tylenol is not working. She states Dr Lucky Cowboy told her to contact us for pain medicine

## 2020-12-20 NOTE — Telephone Encounter (Signed)
Per verbal order from Dr. Grayland Ormond, tramadol sent to pt pharmacy

## 2020-12-20 NOTE — Op Note (Signed)
      [facility]       Operative Note  Date: 12/20/2020  Preoperative diagnosis:  1. Cervical cancer  Postoperative diagnosis:  Same as above  Procedures: #1. Ultrasound guidance for vascular access to the right internal jugular vein. #2. Fluoroscopic guidance for placement of catheter. #3. Placement of CT compatible Port-A-Cath, right internal jugular vein.  Surgeon: Leotis Pain, MD.   Anesthesia: Local with moderate conscious sedation for approximately 25  minutes using 3 mg of Versed and 100 mcg of Fentanyl  Fluoroscopy time: less than 1 minute  Contrast used: 0  Estimated blood loss: 10 cc  Indication for the procedure:  The patient is a 45 y.o.female with cervical cancer.  The patient needs a Port-A-Cath for durable venous access, chemotherapy, lab draws, and CT scans. We are asked to place this. Risks and benefits were discussed and informed consent was obtained.  Description of procedure: The patient was brought to the vascular and interventional radiology suite.  Moderate conscious sedation was administered throughout the procedure during a face to face encounter with the patient with my supervision of the RN administering medicines and monitoring the patient's vital signs, pulse oximetry, telemetry and mental status throughout from the start of the procedure until the patient was taken to the recovery room. The right neck chest and shoulder were sterilely prepped and draped, and a sterile surgical field was created. Ultrasound was used to help visualize a patent right internal jugular vein. This was then accessed under direct ultrasound guidance without difficulty with the Seldinger needle and a permanent image was recorded. A J-wire was placed. After skin nick and dilatation, the peel-away sheath was then placed over the wire. I then anesthetized an area under the clavicle approximately 1-2 fingerbreadths. A transverse incision was created and an inferior  pocket was created with electrocautery and blunt dissection. The port was then brought onto the field, placed into the pocket and secured to the chest wall with 2 Prolene sutures. The catheter was connected to the port and tunneled from the subclavicular incision to the access site. Fluoroscopic guidance was then used to cut the catheter to an appropriate length. The catheter was then placed through the peel-away sheath and the peel-away sheath was removed. The catheter tip was parked in excellent location under fluorocoscopic guidance in the cavoatrial junction. The pocket was then irrigated with antibiotic impregnated saline and the wound was closed with a running 3-0 Vicryl and a 4-0 Monocryl. The access incision was closed with a single 4-0 Monocryl. The Huber needle was used to withdraw blood and flush the port with heparinized saline. Dermabond was then placed as a dressing. The patient tolerated the procedure well and was taken to the recovery room in stable condition.   Leotis Pain 12/20/2020 8:44 AM   This note was created with Dragon Medical transcription system. Any errors in dictation are purely unintentional.

## 2020-12-20 NOTE — Interval H&P Note (Signed)
History and Physical Interval Note:  12/20/2020 8:06 AM  Vanessa Romero  has presented today for surgery, with the diagnosis of Porta Cath Placement    Cervical Ca.  The various methods of treatment have been discussed with the patient and family. After consideration of risks, benefits and other options for treatment, the patient has consented to  Procedure(s): PORTA CATH INSERTION (N/A) as a surgical intervention.  The patient's history has been reviewed, patient examined, no change in status, stable for surgery.  I have reviewed the patient's chart and labs.  Questions were answered to the patient's satisfaction.     Leotis Pain

## 2020-12-20 NOTE — Progress Notes (Signed)
Verbal order received from Dr. Grayland Ormond for pain related to port placement today.

## 2020-12-21 ENCOUNTER — Ambulatory Visit: Admission: RE | Admit: 2020-12-21 | Payer: Medicare Other | Source: Ambulatory Visit

## 2020-12-21 ENCOUNTER — Inpatient Hospital Stay: Payer: Medicare Other

## 2020-12-21 ENCOUNTER — Ambulatory Visit: Payer: Medicare Other

## 2020-12-21 ENCOUNTER — Inpatient Hospital Stay (HOSPITAL_BASED_OUTPATIENT_CLINIC_OR_DEPARTMENT_OTHER): Payer: Medicare Other | Admitting: Oncology

## 2020-12-21 VITALS — BP 113/84 | HR 81 | Temp 97.1°F | Resp 16 | Wt 130.0 lb

## 2020-12-21 VITALS — BP 120/70 | HR 74 | Temp 96.9°F | Resp 16

## 2020-12-21 DIAGNOSIS — Z5111 Encounter for antineoplastic chemotherapy: Secondary | ICD-10-CM | POA: Diagnosis present

## 2020-12-21 DIAGNOSIS — C539 Malignant neoplasm of cervix uteri, unspecified: Secondary | ICD-10-CM | POA: Insufficient documentation

## 2020-12-21 DIAGNOSIS — Z95828 Presence of other vascular implants and grafts: Secondary | ICD-10-CM

## 2020-12-21 DIAGNOSIS — Z510 Encounter for antineoplastic radiation therapy: Secondary | ICD-10-CM | POA: Insufficient documentation

## 2020-12-21 LAB — COMPREHENSIVE METABOLIC PANEL
ALT: 18 U/L (ref 0–44)
AST: 25 U/L (ref 15–41)
Albumin: 3.6 g/dL (ref 3.5–5.0)
Alkaline Phosphatase: 51 U/L (ref 38–126)
Anion gap: 10 (ref 5–15)
BUN: 14 mg/dL (ref 6–20)
CO2: 19 mmol/L — ABNORMAL LOW (ref 22–32)
Calcium: 8.5 mg/dL — ABNORMAL LOW (ref 8.9–10.3)
Chloride: 106 mmol/L (ref 98–111)
Creatinine, Ser: 0.57 mg/dL (ref 0.44–1.00)
GFR, Estimated: >60 mL/min (ref >60)
Glucose, Bld: 87 mg/dL (ref 70–99)
Potassium: 3.7 mmol/L (ref 3.5–5.1)
Sodium: 135 mmol/L (ref 135–145)
Total Bilirubin: 0.3 mg/dL (ref 0.3–1.2)
Total Protein: 6.5 g/dL (ref 6.5–8.1)

## 2020-12-21 LAB — CBC WITH DIFFERENTIAL/PLATELET
Abs Immature Granulocytes: 0.02 10*3/uL (ref 0.00–0.07)
Basophils Absolute: 0.1 10*3/uL (ref 0.0–0.1)
Basophils Relative: 1 %
Eosinophils Absolute: 0.3 10*3/uL (ref 0.0–0.5)
Eosinophils Relative: 3 %
HCT: 34.5 % — ABNORMAL LOW (ref 36.0–46.0)
Hemoglobin: 11 g/dL — ABNORMAL LOW (ref 12.0–15.0)
Immature Granulocytes: 0 %
Lymphocytes Relative: 27 %
Lymphs Abs: 2.3 10*3/uL (ref 0.7–4.0)
MCH: 24.1 pg — ABNORMAL LOW (ref 26.0–34.0)
MCHC: 31.9 g/dL (ref 30.0–36.0)
MCV: 75.5 fL — ABNORMAL LOW (ref 80.0–100.0)
Monocytes Absolute: 0.6 10*3/uL (ref 0.1–1.0)
Monocytes Relative: 7 %
Neutro Abs: 5.3 10*3/uL (ref 1.7–7.7)
Neutrophils Relative %: 62 %
Platelets: 210 10*3/uL (ref 150–400)
RBC: 4.57 MIL/uL (ref 3.87–5.11)
Smear Review: NORMAL
WBC: 8.6 10*3/uL (ref 4.0–10.5)
nRBC: 0 % (ref 0.0–0.2)

## 2020-12-21 MED ORDER — HEPARIN SOD (PORK) LOCK FLUSH 100 UNIT/ML IV SOLN
INTRAVENOUS | Status: AC
  Administered 2020-12-21: 500 [IU]
  Filled 2020-12-21: qty 5

## 2020-12-21 MED ORDER — HEPARIN SOD (PORK) LOCK FLUSH 100 UNIT/ML IV SOLN
500.0000 [IU] | Freq: Once | INTRAVENOUS | Status: DC
  Filled 2020-12-21: qty 5

## 2020-12-21 MED ORDER — SODIUM CHLORIDE 0.9 % IV SOLN
220.8000 mg | Freq: Once | INTRAVENOUS | Status: AC
  Administered 2020-12-21: 220 mg via INTRAVENOUS
  Filled 2020-12-21: qty 22

## 2020-12-21 MED ORDER — DIPHENHYDRAMINE HCL 50 MG/ML IJ SOLN
25.0000 mg | Freq: Once | INTRAMUSCULAR | Status: AC
  Administered 2020-12-21: 25 mg via INTRAVENOUS
  Filled 2020-12-21: qty 1

## 2020-12-21 MED ORDER — FAMOTIDINE 20 MG IN NS 100 ML IVPB
20.0000 mg | Freq: Once | INTRAVENOUS | Status: AC
  Administered 2020-12-21: 20 mg via INTRAVENOUS
  Filled 2020-12-21: qty 20

## 2020-12-21 MED ORDER — SODIUM CHLORIDE 0.9% FLUSH
10.0000 mL | Freq: Once | INTRAVENOUS | Status: AC
  Administered 2020-12-21: 10 mL via INTRAVENOUS
  Filled 2020-12-21: qty 10

## 2020-12-21 MED ORDER — SODIUM CHLORIDE 0.9 % IV SOLN
40.0000 mg/m2 | Freq: Once | INTRAVENOUS | Status: AC
  Administered 2020-12-21: 66 mg via INTRAVENOUS
  Filled 2020-12-21: qty 11

## 2020-12-21 MED ORDER — SODIUM CHLORIDE 0.9 % IV SOLN
Freq: Once | INTRAVENOUS | Status: AC
  Filled 2020-12-21: qty 250

## 2020-12-21 MED ORDER — SODIUM CHLORIDE 0.9 % IV SOLN
10.0000 mg | Freq: Once | INTRAVENOUS | Status: AC
  Administered 2020-12-21: 10 mg via INTRAVENOUS
  Filled 2020-12-21: qty 10

## 2020-12-21 MED ORDER — PALONOSETRON HCL INJECTION 0.25 MG/5ML
0.2500 mg | Freq: Once | INTRAVENOUS | Status: AC
  Administered 2020-12-21: 0.25 mg via INTRAVENOUS
  Filled 2020-12-21: qty 5

## 2020-12-21 NOTE — Patient Instructions (Addendum)
[facility] [place]   Discharge Instructions: Thank you for choosing [facility] to provide your oncology and hematology care.  If you have a lab appointment with the [facility], please go directly to the [facility] and check in at the registration area.  Wear comfortable clothing and clothing appropriate for easy access to any Portacath or PICC line.   We strive to give you quality time with your provider. You may need to reschedule your appointment if you arrive late (15 or more minutes).  Arriving late affects you and other patients whose appointments are after yours.  Also, if you miss three or more appointments without notifying the office, you may be dismissed from the clinic at the provider's discretion.      For prescription refill requests, have your pharmacy contact our office and allow 72 hours for refills to be completed.    Today you received the following chemotherapy and/or immunotherapy agents: Carboplatin, Taxol     To help prevent nausea and vomiting after your treatment, we encourage you to take your nausea medication as directed.  BELOW ARE SYMPTOMS THAT SHOULD BE REPORTED IMMEDIATELY: *FEVER GREATER THAN 100.4 F (38 C) OR HIGHER *CHILLS OR SWEATING *NAUSEA AND VOMITING THAT IS NOT CONTROLLED WITH YOUR NAUSEA MEDICATION *UNUSUAL SHORTNESS OF BREATH *UNUSUAL BRUISING OR BLEEDING *URINARY PROBLEMS (pain or burning when urinating, or frequent urination) *BOWEL PROBLEMS (unusual diarrhea, constipation, pain near the anus) TENDERNESS IN MOUTH AND THROAT WITH OR WITHOUT PRESENCE OF ULCERS (sore throat, sores in mouth, or a toothache) UNUSUAL RASH, SWELLING OR PAIN  UNUSUAL VAGINAL DISCHARGE OR ITCHING   Items with * indicate a potential emergency and should be followed up as soon as possible or go to the Emergency Department if any problems should occur.  Please show the CHEMOTHERAPY ALERT CARD or IMMUNOTHERAPY ALERT CARD at  check-in to the Emergency Department and triage nurse.  Should you have questions after your visit or need to cancel or reschedule your appointment, please contact [facility] [place] AT [place]  [phone] and follow the prompts.  Office hours are 8:00 a.m. to 4:30 p.m. Monday - Friday. Please note that voicemails left after 4:00 p.m. may not be returned until the following business day.  We are closed weekends and major holidays. You have access to a nurse at all times for urgent questions. Please call the main number to the clinic [phone] and follow the prompts.  For any non-urgent questions, you may also contact your provider using MyChart. We now offer e-Visits for anyone 58 and older to request care online for non-urgent symptoms. For details visit [email or web address].   Also download the MyChart app! Go to the app store, search "MyChart", open the app, select [facility], and log in with your MyChart username and password.  Due to Covid, a mask is required upon entering the hospital/clinic. If you do not have a mask, one will be given to you upon arrival. For doctor visits, patients may have 1 support person aged 60 or older with them. For treatment visits, patients cannot have anyone with them due to current Covid guidelines and our immunocompromised population.   Paclitaxel injection What is this medication? PACLITAXEL (PAK li TAX el) is a chemotherapy drug. It targets fast dividing cells, like cancer cells, and causes these cells to die. This medicine is used to treat ovarian cancer, breast cancer, lung cancer, Kaposi's sarcoma, and other cancers. This medicine may be used for other purposes;  ask your health care provider or pharmacist if you have questions. COMMON BRAND NAME(S): Onxol, Taxol What should I tell my care team before I take this medication? They need to know if you have any of these conditions: history of irregular heartbeat liver disease low blood  counts, like low white cell, platelet, or red cell counts lung or breathing disease, like asthma tingling of the fingers or toes, or other nerve disorder an unusual or allergic reaction to paclitaxel, alcohol, polyoxyethylated castor oil, other chemotherapy, other medicines, foods, dyes, or preservatives pregnant or trying to get pregnant breast-feeding How should I use this medication? This drug is given as an infusion into a vein. It is administered in a hospital or clinic by a specially trained health care professional. Talk to your pediatrician regarding the use of this medicine in children. Special care may be needed. Overdosage: If you think you have taken too much of this medicine contact a poison control center or emergency room at once. NOTE: This medicine is only for you. Do not share this medicine with others. What if I miss a dose? It is important not to miss your dose. Call your doctor or health care professional if you are unable to keep an appointment. What may interact with this medication? Do not take this medicine with any of the following medications: live virus vaccines This medicine may also interact with the following medications: antiviral medicines for hepatitis, HIV or AIDS certain antibiotics like erythromycin and clarithromycin certain medicines for fungal infections like ketoconazole and itraconazole certain medicines for seizures like carbamazepine, phenobarbital, phenytoin gemfibrozil nefazodone rifampin St. John's wort This list may not describe all possible interactions. Give your health care provider a list of all the medicines, herbs, non-prescription drugs, or dietary supplements you use. Also tell them if you smoke, drink alcohol, or use illegal drugs. Some items may interact with your medicine. What should I watch for while using this medication? Your condition will be monitored carefully while you are receiving this medicine. You will need important  blood work done while you are taking this medicine. This medicine can cause serious allergic reactions. To reduce your risk you will need to take other medicine(s) before treatment with this medicine. If you experience allergic reactions like skin rash, itching or hives, swelling of the face, lips, or tongue, tell your doctor or health care professional right away. In some cases, you may be given additional medicines to help with side effects. Follow all directions for their use. This drug may make you feel generally unwell. This is not uncommon, as chemotherapy can affect healthy cells as well as cancer cells. Report any side effects. Continue your course of treatment even though you feel ill unless your doctor tells you to stop. Call your doctor or health care professional for advice if you get a fever, chills or sore throat, or other symptoms of a cold or flu. Do not treat yourself. This drug decreases your body's ability to fight infections. Try to avoid being around people who are sick. This medicine may increase your risk to bruise or bleed. Call your doctor or health care professional if you notice any unusual bleeding. Be careful brushing and flossing your teeth or using a toothpick because you may get an infection or bleed more easily. If you have any dental work done, tell your dentist you are receiving this medicine. Avoid taking products that contain aspirin, acetaminophen, ibuprofen, naproxen, or ketoprofen unless instructed by your doctor. These medicines may hide  a fever. Do not become pregnant while taking this medicine. Women should inform their doctor if they wish to become pregnant or think they might be pregnant. There is a potential for serious side effects to an unborn child. Talk to your health care professional or pharmacist for more information. Do not breast-feed an infant while taking this medicine. Men are advised not to father a child while receiving this medicine. This product  may contain alcohol. Ask your pharmacist or healthcare provider if this medicine contains alcohol. Be sure to tell all healthcare providers you are taking this medicine. Certain medicines, like metronidazole and disulfiram, can cause an unpleasant reaction when taken with alcohol. The reaction includes flushing, headache, nausea, vomiting, sweating, and increased thirst. The reaction can last from 30 minutes to several hours. What side effects may I notice from receiving this medication? Side effects that you should report to your doctor or health care professional as soon as possible: allergic reactions like skin rash, itching or hives, swelling of the face, lips, or tongue breathing problems changes in vision fast, irregular heartbeat high or low blood pressure mouth sores pain, tingling, numbness in the hands or feet signs of decreased platelets or bleeding - bruising, pinpoint red spots on the skin, black, tarry stools, blood in the urine signs of decreased red blood cells - unusually weak or tired, feeling faint or lightheaded, falls signs of infection - fever or chills, cough, sore throat, pain or difficulty passing urine signs and symptoms of liver injury like dark yellow or brown urine; general ill feeling or flu-like symptoms; light-colored stools; loss of appetite; nausea; right upper belly pain; unusually weak or tired; yellowing of the eyes or skin swelling of the ankles, feet, hands unusually slow heartbeat Side effects that usually do not require medical attention (report to your doctor or health care professional if they continue or are bothersome): diarrhea hair loss loss of appetite muscle or joint pain nausea, vomiting pain, redness, or irritation at site where injected tiredness This list may not describe all possible side effects. Call your doctor for medical advice about side effects. You may report side effects to FDA at 1-800-FDA-1088. Where should I keep my  medication? This drug is given in a hospital or clinic and will not be stored at home. NOTE: This sheet is a summary. It may not cover all possible information. If you have questions about this medicine, talk to your doctor, pharmacist, or health care provider.  2022 Elsevier/Gold Standard (2020-09-21 00:00:00)  Carboplatin injection What is this medication? CARBOPLATIN (KAR boe pla tin) is a chemotherapy drug. It targets fast dividing cells, like cancer cells, and causes these cells to die. This medicine is used to treat ovarian cancer and many other cancers. This medicine may be used for other purposes; ask your health care provider or pharmacist if you have questions. COMMON BRAND NAME(S): Paraplatin What should I tell my care team before I take this medication? They need to know if you have any of these conditions: blood disorders hearing problems kidney disease recent or ongoing radiation therapy an unusual or allergic reaction to carboplatin, cisplatin, other chemotherapy, other medicines, foods, dyes, or preservatives pregnant or trying to get pregnant breast-feeding How should I use this medication? This drug is usually given as an infusion into a vein. It is administered in a hospital or clinic by a specially trained health care professional. Talk to your pediatrician regarding the use of this medicine in children. Special care may be needed.  Overdosage: If you think you have taken too much of this medicine contact a poison control center or emergency room at once. NOTE: This medicine is only for you. Do not share this medicine with others. What if I miss a dose? It is important not to miss a dose. Call your doctor or health care professional if you are unable to keep an appointment. What may interact with this medication? medicines for seizures medicines to increase blood counts like filgrastim, pegfilgrastim, sargramostim some antibiotics like amikacin, gentamicin, neomycin,  streptomycin, tobramycin vaccines Talk to your doctor or health care professional before taking any of these medicines: acetaminophen aspirin ibuprofen ketoprofen naproxen This list may not describe all possible interactions. Give your health care provider a list of all the medicines, herbs, non-prescription drugs, or dietary supplements you use. Also tell them if you smoke, drink alcohol, or use illegal drugs. Some items may interact with your medicine. What should I watch for while using this medication? Your condition will be monitored carefully while you are receiving this medicine. You will need important blood work done while you are taking this medicine. This drug may make you feel generally unwell. This is not uncommon, as chemotherapy can affect healthy cells as well as cancer cells. Report any side effects. Continue your course of treatment even though you feel ill unless your doctor tells you to stop. In some cases, you may be given additional medicines to help with side effects. Follow all directions for their use. Call your doctor or health care professional for advice if you get a fever, chills or sore throat, or other symptoms of a cold or flu. Do not treat yourself. This drug decreases your body's ability to fight infections. Try to avoid being around people who are sick. This medicine may increase your risk to bruise or bleed. Call your doctor or health care professional if you notice any unusual bleeding. Be careful brushing and flossing your teeth or using a toothpick because you may get an infection or bleed more easily. If you have any dental work done, tell your dentist you are receiving this medicine. Avoid taking products that contain aspirin, acetaminophen, ibuprofen, naproxen, or ketoprofen unless instructed by your doctor. These medicines may hide a fever. Do not become pregnant while taking this medicine. Women should inform their doctor if they wish to become pregnant or  think they might be pregnant. There is a potential for serious side effects to an unborn child. Talk to your health care professional or pharmacist for more information. Do not breast-feed an infant while taking this medicine. What side effects may I notice from receiving this medication? Side effects that you should report to your doctor or health care professional as soon as possible: allergic reactions like skin rash, itching or hives, swelling of the face, lips, or tongue signs of infection - fever or chills, cough, sore throat, pain or difficulty passing urine signs of decreased platelets or bleeding - bruising, pinpoint red spots on the skin, black, tarry stools, nosebleeds signs of decreased red blood cells - unusually weak or tired, fainting spells, lightheadedness breathing problems changes in hearing changes in vision chest pain high blood pressure low blood counts - This drug may decrease the number of white blood cells, red blood cells and platelets. You may be at increased risk for infections and bleeding. nausea and vomiting pain, swelling, redness or irritation at the injection site pain, tingling, numbness in the hands or feet problems with balance, talking, walking trouble   passing urine or change in the amount of urine Side effects that usually do not require medical attention (report to your doctor or health care professional if they continue or are bothersome): hair loss loss of appetite metallic taste in the mouth or changes in taste This list may not describe all possible side effects. Call your doctor for medical advice about side effects. You may report side effects to FDA at 1-800-FDA-1088. Where should I keep my medication? This drug is given in a hospital or clinic and will not be stored at home. NOTE: This sheet is a summary. It may not cover all possible information. If you have questions about this medicine, talk to your doctor, pharmacist, or health care  provider.  2022 Elsevier/Gold Standard (2007-06-12 00:00:00)

## 2020-12-21 NOTE — Progress Notes (Signed)
Vanessa Romero tolerated her first Taxol and Carboplatin treatment today without any complications. Vital signs remained stable throughout treatment.

## 2020-12-21 NOTE — Progress Notes (Signed)
Pt reports poor appetite and having to make herself eat. Pt drinking 2 supplemental drinks/day. Pt c/o chronic back pain. Port site pain has improved with recent tramadol rx.

## 2020-12-22 ENCOUNTER — Ambulatory Visit
Admission: RE | Admit: 2020-12-22 | Discharge: 2020-12-22 | Disposition: A | Discharge status: Home / Self Care | Payer: Medicare Other | Source: Ambulatory Visit | Attending: Radiation Oncology | Admitting: Radiation Oncology

## 2020-12-22 ENCOUNTER — Inpatient Hospital Stay (HOSPITAL_BASED_OUTPATIENT_CLINIC_OR_DEPARTMENT_OTHER): Payer: Medicare Other | Admitting: Hospice and Palliative Medicine

## 2020-12-22 ENCOUNTER — Ambulatory Visit: Payer: Medicare Other | Admitting: Surgery

## 2020-12-22 ENCOUNTER — Other Ambulatory Visit: Payer: Self-pay

## 2020-12-22 DIAGNOSIS — Z510 Encounter for antineoplastic radiation therapy: Secondary | ICD-10-CM | POA: Diagnosis not present

## 2020-12-22 DIAGNOSIS — C539 Malignant neoplasm of cervix uteri, unspecified: Secondary | ICD-10-CM

## 2020-12-22 NOTE — Progress Notes (Signed)
Multidisciplinary Oncology Council Documentation  Vanessa Romero was presented by our [facility] on 12/22/2020, which included representatives from:  Palliative Care Dietitian  Physical/Occupational Therapist Nurse Navigator Genetics Speech Therapist Social work Survivorship RN Financial Navigator Research RN   Vanessa Romero currently presents with history of cervical cancer  We reviewed previous medical and familial history, history of present illness, and recent lab results along with all available histopathologic and imaging studies. The [facility] considered available treatment options and made the following recommendations/referrals:  Nutrition  The MOC is a meeting of clinicians from various specialty areas who evaluate and discuss patients for whom a multidisciplinary approach is being considered. Final determinations in the plan of care are those of the provider(s).   Today's extended care, comprehensive team conference, Vanessa Romero was not present for the discussion and was not examined.

## 2020-12-23 ENCOUNTER — Other Ambulatory Visit: Payer: Self-pay | Admitting: *Deleted

## 2020-12-23 ENCOUNTER — Ambulatory Visit
Admission: RE | Admit: 2020-12-23 | Discharge: 2020-12-23 | Disposition: A | Discharge status: Home / Self Care | Payer: Medicare Other | Source: Ambulatory Visit | Attending: Radiation Oncology | Admitting: Radiation Oncology

## 2020-12-23 DIAGNOSIS — Z510 Encounter for antineoplastic radiation therapy: Secondary | ICD-10-CM | POA: Diagnosis not present

## 2020-12-24 ENCOUNTER — Ambulatory Visit
Admission: RE | Admit: 2020-12-24 | Discharge: 2020-12-24 | Disposition: A | Discharge status: Home / Self Care | Payer: Medicare Other | Source: Ambulatory Visit | Attending: Radiation Oncology | Admitting: Radiation Oncology

## 2020-12-24 ENCOUNTER — Telehealth: Payer: Self-pay | Admitting: *Deleted

## 2020-12-24 DIAGNOSIS — Z510 Encounter for antineoplastic radiation therapy: Secondary | ICD-10-CM | POA: Diagnosis not present

## 2020-12-24 NOTE — Telephone Encounter (Signed)
Patient called reporting that she started having low back pain that has now gone into her hips and lower abdominal/ pelvic area. It has become difficult to ambulate and sleep. She has tried Tylenol and Tramadol with no relief. Please advise

## 2020-12-24 NOTE — Telephone Encounter (Signed)
Per [employer], pt may increase tramadol dose to 100mg  q6 hours. Also recommended combining with Tylenol. Pt informed of recommendations and verbalized understanding.

## 2020-12-27 ENCOUNTER — Other Ambulatory Visit: Payer: Self-pay | Admitting: Oncology

## 2020-12-27 ENCOUNTER — Ambulatory Visit
Admission: RE | Admit: 2020-12-27 | Discharge: 2020-12-27 | Disposition: A | Discharge status: Home / Self Care | Payer: Medicare Other | Source: Ambulatory Visit | Attending: Radiation Oncology | Admitting: Radiation Oncology

## 2020-12-27 ENCOUNTER — Telehealth: Payer: Self-pay | Admitting: *Deleted

## 2020-12-27 ENCOUNTER — Other Ambulatory Visit: Payer: Self-pay | Admitting: Emergency Medicine

## 2020-12-27 ENCOUNTER — Other Ambulatory Visit: Payer: Self-pay | Admitting: *Deleted

## 2020-12-27 DIAGNOSIS — C539 Malignant neoplasm of cervix uteri, unspecified: Secondary | ICD-10-CM

## 2020-12-27 DIAGNOSIS — Z510 Encounter for antineoplastic radiation therapy: Secondary | ICD-10-CM | POA: Diagnosis not present

## 2020-12-27 MED ORDER — TRAMADOL HCL 50 MG PO TABS: 50.0000 mg | ORAL_TABLET | Freq: Four times a day (QID) | ORAL | 0 refills | Status: DC | PRN

## 2020-12-27 NOTE — Telephone Encounter (Signed)
Patient called asking why her Tramadol prescription was denied and what she is to do about her pain. She was crying by the end of her msg

## 2020-12-27 NOTE — Telephone Encounter (Signed)
Patient called stating that she is having low back pain going around and into her hips and abdomen. She states that the increase in the Tramadol is not touching her pain which is sharp in nature. Please advise

## 2020-12-27 NOTE — Telephone Encounter (Signed)
Dr Grayland Ormond has sent refill to pharmacy. Was initially denied d/t being too soon to refill.

## 2020-12-27 NOTE — Progress Notes (Signed)
[facility]  [phone]) [phone] [phone]) [phone]  ID: Vanessa Romero OB: Sep 08, 1975  MR#: [id number]  QQP#:[id number]  Patient Care Team: Anselmo Pickler, MD as PCP - General (Internal Medicine)  CHIEF COMPLAINT: Stage IIb adenocarcinoma of the cervix.  INTERVAL HISTORY: Patient returns to clinic today for further evaluation and consideration of cycle 2 of weekly carboplatinum and Taxol.  She is tolerating her treatments well.  She continues to have significant pelvic and back pain that is on relieved with tramadol.  She has no neurologic complaints.  She denies any recent fevers or illnesses.  She has a good appetite and denies weight loss. She has no chest pain, shortness of breath, cough, or hemoptysis.  She denies any nausea, vomiting, constipation, or diarrhea.  She has no urinary complaints.  Patient offers no further specific complaints today.  REVIEW OF SYSTEMS:   Review of Systems  Constitutional: Negative.  Negative for fever, malaise/fatigue and weight loss.  Respiratory: Negative.  Negative for cough, hemoptysis and shortness of breath.   Cardiovascular: Negative.  Negative for chest pain and leg swelling.  Gastrointestinal: Negative.  Negative for abdominal pain.  Genitourinary: Negative.  Negative for dysuria.  Musculoskeletal:  Positive for back pain.  Skin: Negative.  Negative for rash.  Neurological: Negative.  Negative for dizziness, focal weakness, weakness and headaches.  Psychiatric/Behavioral: Negative.  The patient is not nervous/anxious.    As per HPI. Otherwise, a complete review of systems is negative.  PAST MEDICAL HISTORY: Past Medical History:  Diagnosis Date   Alcohol abuse    Anemia    Cancer ([place])    Tobacco dependence     PAST SURGICAL HISTORY: Past Surgical History:  Procedure Laterality Date   ABDOMINAL ADHESION SURGERY     bowel obstruction     x2   CERVICAL CONIZATION W/BX N/A 11/26/2020   Procedure:  CONIZATION CERVIX WITH BIOPSY;  Surgeon: Malachy Mood, MD;  Location: [facility] ORS;  Service: Gynecology;  Laterality: N/A;   ESOPHAGOGASTRODUODENOSCOPY N/A 11/24/2020   Procedure: ESOPHAGOGASTRODUODENOSCOPY (EGD);  Surgeon: Lin Landsman, MD;  Location: [facility];  Service: Gastroenterology;  Laterality: N/A;   laparoscopic knee surgery     PORTA CATH INSERTION N/A 12/20/2020   Procedure: PORTA CATH INSERTION;  Surgeon: Algernon Huxley, MD;  Location: [facility];  Service: Cardiovascular;  Laterality: N/A;   ROUX-EN-Y GASTRIC BYPASS     TEAR DUCT PROBING     unclogg   VAGOTOMY     VENTRICULOPERITONEAL SHUNT     x6 put in and removals    FAMILY HISTORY: Family History  Problem Relation Age of Onset   Cancer Mother    Cancer Father    Cancer Maternal Grandmother     ADVANCED DIRECTIVES (Y/N):  N  HEALTH MAINTENANCE: Social History   Tobacco Use   Smoking status: Former    Types: Cigarettes   Smokeless tobacco: Current  Vaping Use   Vaping Use: Some days  Substance Use Topics   Alcohol use: Not Currently   Drug use: Not Currently     Colonoscopy:  PAP:  Bone density:  Lipid panel:  Allergies  Allergen Reactions   Contrast Media [Iodinated Diagnostic Agents] Hives   Gabapentin Other (See Comments), Rash and Palpitations    Other Reaction: tachycardia Other Reaction: tachycardia    Morphine Dermatitis, Hives, Rash, Swelling and Other (See Comments)    Other reaction(s): Unknown (comments) Has tolerated hydromorphone (Dilaudid) Immediate after injections arm edema and arm  turned bright red Immediate after injections arm edema and arm turned bright red IV Morphine IV Morphine    Sumatriptan Dermatitis, Hives, Itching, Other (See Comments) and Swelling    Other reaction(s): Joint Pain, Other (See Comments), Other (see comments), Unknown (comments) lock jaw Lock jaw Lock jaw Lock jaw TIGHTENING OF JAW Lock jaw lock jaw Lock jaw TIGHTENING  OF JAW Lock jaw    Zolpidem Nausea And Vomiting and Other (See Comments)    Other reaction(s): Other (see comments) sleep walking sleep walking Sleep walking  don't tolerate it well    Erythromycin Diarrhea, Nausea And Vomiting and Nausea Only    Extreme upset stomach    Valproic Acid Rash    Other reaction(s): Other (see comments), Unknown MOOD DISORDER MOOD DISORDER Depakote: Reaction unknown     Acetazolamide     Other reaction(s): Unknown (comments)   Erythromycin Base     Other reaction(s): UNKNOWN   Amoxicillin Rash   Divalproex Sodium Anxiety and Other (See Comments)    Current Outpatient Medications  Medication Sig Dispense Refill   cholecalciferol (VITAMIN D) 25 MCG tablet Take 1 tablet (1,000 Units total) by mouth daily. 30 tablet 1   escitalopram (LEXAPRO) 20 MG tablet Take 1 tablet (20 mg total) by mouth daily at 12 noon. 30 tablet 1   feeding supplement (ENSURE ENLIVE / ENSURE PLUS) LIQD Take 237 mLs by mouth 3 (three) times daily between meals. 28413 mL 0   folic acid (FOLVITE) 1 MG tablet Take 1 tablet (1 mg total) by mouth daily. 30 tablet 0   HYDROcodone-acetaminophen (NORCO/VICODIN) 5-325 MG tablet Take 1 tablet by mouth every 6 (six) hours as needed for moderate pain. 30 tablet 0   lamoTRIgine (LAMICTAL) 25 MG tablet Take 2 tablets (50 mg total) by mouth daily. 60 tablet 1   lidocaine-prilocaine (EMLA) cream Apply to affected area once 30 g 3   Multiple Vitamin (MULTIVITAMIN WITH MINERALS) TABS tablet Take 1 tablet by mouth daily.     ondansetron (ZOFRAN) 8 MG tablet Take 1 tablet (8 mg total) by mouth 2 (two) times daily as needed for refractory nausea / vomiting. 60 tablet 1   prochlorperazine (COMPAZINE) 10 MG tablet Take 1 tablet (10 mg total) by mouth every 6 (six) hours as needed (Nausea or vomiting). 60 tablet 1   thiamine 100 MG tablet Take 1 tablet (100 mg total) by mouth daily. 30 tablet 0   traMADol (ULTRAM) 50 MG tablet Take 1 tablet (50 mg  total) by mouth every 6 (six) hours as needed. 30 tablet 0   vitamin B-12 1000 MCG tablet Take 1 tablet (1,000 mcg total) by mouth daily. 30 tablet 0   No current facility-administered medications for this visit.   Facility-Administered Medications Ordered in Other Visits  Medication Dose Route Frequency Provider Last Rate Last Admin   heparin lock flush 100 UNIT/ML injection            heparin lock flush 100 unit/mL  500 Units Intravenous Once Lloyd Huger, MD        OBJECTIVE: Vitals:   12/28/20 0852  BP: 105/65  Pulse: 75  Resp: 18  Temp: 98.2 F (36.8 C)  SpO2: 99%     Body mass index is 20.99 kg/m.    ECOG FS:0 - Asymptomatic  General: Well-developed, well-nourished, no acute distress. Eyes: Pink conjunctiva, anicteric sclera. HEENT: Normocephalic, moist mucous membranes. Lungs: No audible wheezing or coughing. Heart: Regular rate and rhythm. Abdomen: Soft, nontender,  no obvious distention. Musculoskeletal: No edema, cyanosis, or clubbing. Neuro: Alert, answering all questions appropriately. Cranial nerves grossly intact. Skin: No rashes or petechiae noted. Psych: Normal affect.  LAB RESULTS:  Lab Results  Component Value Date   NA 137 12/28/2020   K 3.9 12/28/2020   CL 106 12/28/2020   CO2 24 12/28/2020   GLUCOSE 81 12/28/2020   BUN 17 12/28/2020   CREATININE 0.64 12/28/2020   CALCIUM 8.2 (L) 12/28/2020   PROT 6.1 (L) 12/28/2020   ALBUMIN 3.4 (L) 12/28/2020   AST 19 12/28/2020   ALT 15 12/28/2020   ALKPHOS 43 12/28/2020   BILITOT 0.3 12/28/2020   GFRNONAA >60 12/28/2020    Lab Results  Component Value Date   WBC 6.3 12/28/2020   NEUTROABS 4.4 12/28/2020   HGB 10.5 (L) 12/28/2020   HCT 34.3 (L) 12/28/2020   MCV 80.1 12/28/2020   PLT 165 12/28/2020     STUDIES: PERIPHERAL VASCULAR CATHETERIZATION  Result Date: 12/20/2020 See surgical note for result.  NM PET Image Initial (PI) Skull Base To Thigh  Result Date: 12/06/2020 CLINICAL  DATA:  Initial treatment strategy for cervical cancer. EXAM: NUCLEAR MEDICINE PET SKULL BASE TO THIGH TECHNIQUE: 7.08 mCi F-18 FDG was injected intravenously. Full-ring PET imaging was performed from the skull base to thigh after the radiotracer. CT data was obtained and used for attenuation correction and anatomic localization. Fasting blood glucose: 79 mg/dl COMPARISON:  Pelvic MRI 11/24/2020. Pelvic ultrasound 11/24/2020. Chest CT 05/22/2018 (report only). FINDINGS: Mediastinal blood pool activity: SUV max 1.4 NECK: No hypermetabolic cervical lymph nodes are identified.There are no lesions of the pharyngeal mucosal space. Incidental CT findings: none CHEST: There are no hypermetabolic mediastinal, hilar or axillary lymph nodes. No hypermetabolic pulmonary activity or suspicious nodularity. Incidental CT findings: Small posterior mediastinal calcifications with postsurgical changes at the gastroesophageal junction. ABDOMEN/PELVIS: There is no hypermetabolic activity within the liver, adrenal glands, spleen or pancreas. There is no hypermetabolic nodal activity. There is hypermetabolic activity within the previously demonstrated cervical mass (SUV max 5.6). There is no extension of this abnormal metabolic activity beyond the cervix. No suspicious adnexal activity. Incidental CT findings: No hydronephrosis. Mild extrahepatic biliary dilatation post cholecystectomy. Postsurgical changes in the stomach suggesting previous gastric bypass procedure. SKELETON: There is no hypermetabolic activity to suggest osseous metastatic disease. Incidental CT findings: none IMPRESSION: 1. The patient's known cervical cancer is hypermetabolic. No evidence of extrauterine extension of tumor, abdominopelvic adenopathy or distant metastases. 2. No hydronephrosis or suspicious adnexal findings. 3. Postsurgical changes as described. Electronically Signed   By: Richardean Sale M.D.   On: 12/06/2020 13:49    ASSESSMENT: Stage IIb  adenocarcinoma of the cervix.  PLAN:    Stage IIb adenocarcinoma of the cervix: By report patient is not a surgical candidate, therefore will proceed with concurrent XRT along with weekly chemotherapy using carboplatinum and Taxol.  She also will benefit from adjuvant brachytherapy.  PET scan results from December 06, 2020 reviewed independently and reported as above with no obvious evidence of malignancy outside of patient's known cervical cancer.  Proceed with cycle 2 of weekly carboplatin and Taxol today.  Continue daily XRT.  Return to clinic in 1 week for further evaluation and consideration of cycle 3.   Anemia: Chronic and unchanged.  Patient's hemoglobin has trended down slightly to 10.5. Thrombocytopenia: Resolved. Pelvic pain: Patient given a prescription for Vicodin today.  She has also signed a pain contract.  Patient expressed understanding and was in agreement  with this plan. She also understands that She can call clinic at any time with any questions, concerns, or complaints.    Cancer Staging  Cervical cancer, FIGO stage IIB ([place]) Staging form: Cervix Uteri, AJCC Version 9 - Clinical stage from 12/06/2020: FIGO Stage IIB (cT2b, cN0, cM0) - Signed by Lloyd Huger, MD on 12/06/2020 Stage prefix: Initial diagnosis  Lloyd Huger, MD   12/28/2020 12:41 PM

## 2020-12-27 NOTE — Telephone Encounter (Signed)
Referral to pain clinic placed.

## 2020-12-27 NOTE — Telephone Encounter (Signed)
Pt just sent refill request for tramadol. Just filled rx for 30 tablets last week.

## 2020-12-28 ENCOUNTER — Ambulatory Visit
Admission: RE | Admit: 2020-12-28 | Discharge: 2020-12-28 | Disposition: A | Discharge status: Home / Self Care | Payer: Medicare Other | Source: Ambulatory Visit | Attending: Radiation Oncology | Admitting: Radiation Oncology

## 2020-12-28 ENCOUNTER — Inpatient Hospital Stay: Payer: Medicare Other

## 2020-12-28 ENCOUNTER — Inpatient Hospital Stay (HOSPITAL_BASED_OUTPATIENT_CLINIC_OR_DEPARTMENT_OTHER): Payer: Medicare Other | Admitting: Oncology

## 2020-12-28 ENCOUNTER — Other Ambulatory Visit: Payer: Self-pay

## 2020-12-28 VITALS — BP 105/65 | HR 75 | Temp 98.2°F | Resp 18 | Wt 134.0 lb

## 2020-12-28 DIAGNOSIS — C539 Malignant neoplasm of cervix uteri, unspecified: Secondary | ICD-10-CM

## 2020-12-28 DIAGNOSIS — Z510 Encounter for antineoplastic radiation therapy: Secondary | ICD-10-CM | POA: Diagnosis not present

## 2020-12-28 LAB — COMPREHENSIVE METABOLIC PANEL
ALT: 15 U/L (ref 0–44)
AST: 19 U/L (ref 15–41)
Albumin: 3.4 g/dL — ABNORMAL LOW (ref 3.5–5.0)
Alkaline Phosphatase: 43 U/L (ref 38–126)
Anion gap: 7 (ref 5–15)
BUN: 17 mg/dL (ref 6–20)
CO2: 24 mmol/L (ref 22–32)
Calcium: 8.2 mg/dL — ABNORMAL LOW (ref 8.9–10.3)
Chloride: 106 mmol/L (ref 98–111)
Creatinine, Ser: 0.64 mg/dL (ref 0.44–1.00)
GFR, Estimated: >60 mL/min (ref >60)
Glucose, Bld: 81 mg/dL (ref 70–99)
Potassium: 3.9 mmol/L (ref 3.5–5.1)
Sodium: 137 mmol/L (ref 135–145)
Total Bilirubin: 0.3 mg/dL (ref 0.3–1.2)
Total Protein: 6.1 g/dL — ABNORMAL LOW (ref 6.5–8.1)

## 2020-12-28 LAB — CBC WITH DIFFERENTIAL/PLATELET
Abs Immature Granulocytes: 0.04 10*3/uL (ref 0.00–0.07)
Basophils Absolute: 0.1 10*3/uL (ref 0.0–0.1)
Basophils Relative: 1 %
Eosinophils Absolute: 0.3 10*3/uL (ref 0.0–0.5)
Eosinophils Relative: 4 %
HCT: 34.3 % — ABNORMAL LOW (ref 36.0–46.0)
Hemoglobin: 10.5 g/dL — ABNORMAL LOW (ref 12.0–15.0)
Immature Granulocytes: 1 %
Lymphocytes Relative: 18 %
Lymphs Abs: 1.1 10*3/uL (ref 0.7–4.0)
MCH: 24.5 pg — ABNORMAL LOW (ref 26.0–34.0)
MCHC: 30.6 g/dL (ref 30.0–36.0)
MCV: 80.1 fL (ref 80.0–100.0)
Monocytes Absolute: 0.4 10*3/uL (ref 0.1–1.0)
Monocytes Relative: 7 %
Neutro Abs: 4.4 10*3/uL (ref 1.7–7.7)
Neutrophils Relative %: 69 %
Platelets: 165 10*3/uL (ref 150–400)
RBC: 4.28 MIL/uL (ref 3.87–5.11)
Smear Review: NORMAL
WBC: 6.3 10*3/uL (ref 4.0–10.5)
nRBC: 0 % (ref 0.0–0.2)

## 2020-12-28 MED ORDER — HEPARIN SOD (PORK) LOCK FLUSH 100 UNIT/ML IV SOLN
500.0000 [IU] | Freq: Once | INTRAVENOUS | Status: AC
  Administered 2020-12-28: 500 [IU] via INTRAVENOUS
  Filled 2020-12-28: qty 5

## 2020-12-28 MED ORDER — PALONOSETRON HCL INJECTION 0.25 MG/5ML
0.2500 mg | Freq: Once | INTRAVENOUS | Status: AC
  Administered 2020-12-28: 0.25 mg via INTRAVENOUS
  Filled 2020-12-28: qty 5

## 2020-12-28 MED ORDER — FAMOTIDINE 20 MG IN NS 100 ML IVPB
20.0000 mg | Freq: Once | INTRAVENOUS | Status: AC
  Administered 2020-12-28: 20 mg via INTRAVENOUS
  Filled 2020-12-28: qty 100
  Filled 2020-12-28: qty 20

## 2020-12-28 MED ORDER — SODIUM CHLORIDE 0.9 % IV SOLN
Freq: Once | INTRAVENOUS | Status: AC
  Filled 2020-12-28: qty 250

## 2020-12-28 MED ORDER — SODIUM CHLORIDE 0.9 % IV SOLN
10.0000 mg | Freq: Once | INTRAVENOUS | Status: AC
  Administered 2020-12-28: 10 mg via INTRAVENOUS
  Filled 2020-12-28: qty 10

## 2020-12-28 MED ORDER — SODIUM CHLORIDE 0.9% FLUSH
10.0000 mL | Freq: Once | INTRAVENOUS | Status: AC
  Administered 2020-12-28: 10 mL via INTRAVENOUS
  Filled 2020-12-28: qty 10

## 2020-12-28 MED ORDER — HEPARIN SOD (PORK) LOCK FLUSH 100 UNIT/ML IV SOLN
INTRAVENOUS | Status: AC
  Filled 2020-12-28: qty 5

## 2020-12-28 MED ORDER — HYDROCODONE-ACETAMINOPHEN 5-325 MG PO TABS: 1.0000 | ORAL_TABLET | Freq: Four times a day (QID) | ORAL | 0 refills | Status: DC | PRN

## 2020-12-28 MED ORDER — SODIUM CHLORIDE 0.9 % IV SOLN
220.8000 mg | Freq: Once | INTRAVENOUS | Status: AC
  Administered 2020-12-28: 220 mg via INTRAVENOUS
  Filled 2020-12-28: qty 22

## 2020-12-28 MED ORDER — SODIUM CHLORIDE 0.9 % IV SOLN
40.0000 mg/m2 | Freq: Once | INTRAVENOUS | Status: AC
  Administered 2020-12-28: 66 mg via INTRAVENOUS
  Filled 2020-12-28: qty 11

## 2020-12-28 MED ORDER — DIPHENHYDRAMINE HCL 50 MG/ML IJ SOLN
25.0000 mg | Freq: Once | INTRAMUSCULAR | Status: AC
  Administered 2020-12-28: 25 mg via INTRAVENOUS
  Filled 2020-12-28: qty 1

## 2020-12-28 NOTE — Patient Instructions (Signed)
[facility] [place]  Discharge Instructions: °Thank you for choosing [facility] to provide your oncology and hematology care.  °If you have a lab appointment with the Cancer Center, please go directly to the Cancer Center and check in at the registration area. ° °Wear comfortable clothing and clothing appropriate for easy access to any Portacath or PICC line.  ° °We strive to give you quality time with your provider. You may need to reschedule your appointment if you arrive late (15 or more minutes).  Arriving late affects you and other patients whose appointments are after yours.  Also, if you miss three or more appointments without notifying the office, you may be dismissed from the clinic at the provider’s discretion.    °  °For prescription refill requests, have your pharmacy contact our office and allow 72 hours for refills to be completed.   ° °Today you received the following chemotherapy and/or immunotherapy agents : Taxol / Carboplatin   °  °To help prevent nausea and vomiting after your treatment, we encourage you to take your nausea medication as directed. ° °BELOW ARE SYMPTOMS THAT SHOULD BE REPORTED IMMEDIATELY: °*FEVER GREATER THAN 100.4 F (38 °C) OR HIGHER °*CHILLS OR SWEATING °*NAUSEA AND VOMITING THAT IS NOT CONTROLLED WITH YOUR NAUSEA MEDICATION °*UNUSUAL SHORTNESS OF BREATH °*UNUSUAL BRUISING OR BLEEDING °*URINARY PROBLEMS (pain or burning when urinating, or frequent urination) °*BOWEL PROBLEMS (unusual diarrhea, constipation, pain near the anus) °TENDERNESS IN MOUTH AND THROAT WITH OR WITHOUT PRESENCE OF ULCERS (sore throat, sores in mouth, or a toothache) °UNUSUAL RASH, SWELLING OR PAIN  °UNUSUAL VAGINAL DISCHARGE OR ITCHING  ° °Items with * indicate a potential emergency and should be followed up as soon as possible or go to the Emergency Department if any problems should occur. ° °Please show the CHEMOTHERAPY ALERT CARD or IMMUNOTHERAPY ALERT CARD at  check-in to the Emergency Department and triage nurse. ° °Should you have questions after your visit or need to cancel or reschedule your appointment, please contact [facility]  [phone] and follow the prompts.  Office hours are 8:00 a.m. to 4:30 p.m. Monday - Friday. Please note that voicemails left after 4:00 p.m. may not be returned until the following business day.  We are closed weekends and major holidays. You have access to a nurse at all times for urgent questions. Please call the main number to the clinic [phone] and follow the prompts. ° °For any non-urgent questions, you may also contact your provider using MyChart. We now offer e-Visits for anyone 18 and older to request care online for non-urgent symptoms. For details visit [email or web address]. °  °Also download the MyChart app! Go to the app store, search "MyChart", open the app, select [facility], and log in with your MyChart username and password. ° °Due to Covid, a mask is required upon entering the hospital/clinic. If you do not have a mask, one will be given to you upon arrival. For doctor visits, patients may have 1 support person aged 18 or older with them. For treatment visits, patients cannot have anyone with them due to current Covid guidelines and our immunocompromised population.  °

## 2020-12-28 NOTE — Progress Notes (Signed)
Patient started her period 2 days ago and she states that she is passing clots the size of 2 quarters and a lot of pressure. She is also having ongoing back and hip pain (going on before the period issues). Tramadol takes the edge off but doesn't make it go away. She reports trouble sleeping and appetite is so-so and occasional nausea. Denies urinary and bowel problems.

## 2020-12-29 ENCOUNTER — Ambulatory Visit
Admission: RE | Admit: 2020-12-29 | Discharge: 2020-12-29 | Disposition: A | Discharge status: Home / Self Care | Payer: Medicare Other | Source: Ambulatory Visit | Attending: Radiation Oncology | Admitting: Radiation Oncology

## 2020-12-29 DIAGNOSIS — Z510 Encounter for antineoplastic radiation therapy: Secondary | ICD-10-CM | POA: Diagnosis not present

## 2020-12-30 ENCOUNTER — Ambulatory Visit
Admission: RE | Admit: 2020-12-30 | Discharge: 2020-12-30 | Disposition: A | Discharge status: Home / Self Care | Payer: Medicare Other | Source: Ambulatory Visit | Attending: Radiation Oncology | Admitting: Radiation Oncology

## 2020-12-30 DIAGNOSIS — Z510 Encounter for antineoplastic radiation therapy: Secondary | ICD-10-CM | POA: Diagnosis not present

## 2020-12-31 ENCOUNTER — Ambulatory Visit
Admission: RE | Admit: 2020-12-31 | Discharge: 2020-12-31 | Disposition: A | Discharge status: Home / Self Care | Payer: Medicare Other | Source: Ambulatory Visit | Attending: Radiation Oncology | Admitting: Radiation Oncology

## 2020-12-31 DIAGNOSIS — Z510 Encounter for antineoplastic radiation therapy: Secondary | ICD-10-CM | POA: Diagnosis not present

## 2021-01-03 ENCOUNTER — Ambulatory Visit
Admission: RE | Admit: 2021-01-03 | Discharge: 2021-01-03 | Disposition: A | Discharge status: Home / Self Care | Payer: Medicare Other | Source: Ambulatory Visit | Attending: Radiation Oncology | Admitting: Radiation Oncology

## 2021-01-03 DIAGNOSIS — Z510 Encounter for antineoplastic radiation therapy: Secondary | ICD-10-CM | POA: Diagnosis not present

## 2021-01-04 ENCOUNTER — Ambulatory Visit
Admission: RE | Admit: 2021-01-04 | Discharge: 2021-01-04 | Disposition: A | Discharge status: Home / Self Care | Payer: Medicare Other | Source: Ambulatory Visit | Attending: Radiation Oncology | Admitting: Radiation Oncology

## 2021-01-04 ENCOUNTER — Inpatient Hospital Stay: Payer: Medicare Other

## 2021-01-04 ENCOUNTER — Other Ambulatory Visit: Payer: Self-pay

## 2021-01-04 ENCOUNTER — Encounter: Payer: Self-pay | Admitting: Oncology

## 2021-01-04 ENCOUNTER — Inpatient Hospital Stay (HOSPITAL_BASED_OUTPATIENT_CLINIC_OR_DEPARTMENT_OTHER): Payer: Medicare Other | Admitting: Oncology

## 2021-01-04 VITALS — Resp 16

## 2021-01-04 VITALS — BP 105/74 | HR 85 | Temp 96.7°F | Ht 67.0 in | Wt 130.9 lb

## 2021-01-04 DIAGNOSIS — C539 Malignant neoplasm of cervix uteri, unspecified: Secondary | ICD-10-CM

## 2021-01-04 DIAGNOSIS — R1030 Lower abdominal pain, unspecified: Secondary | ICD-10-CM | POA: Diagnosis not present

## 2021-01-04 DIAGNOSIS — Z510 Encounter for antineoplastic radiation therapy: Secondary | ICD-10-CM | POA: Diagnosis not present

## 2021-01-04 LAB — COMPREHENSIVE METABOLIC PANEL
ALT: 13 U/L (ref 0–44)
AST: 20 U/L (ref 15–41)
Albumin: 3.2 g/dL — ABNORMAL LOW (ref 3.5–5.0)
Alkaline Phosphatase: 40 U/L (ref 38–126)
Anion gap: 8 (ref 5–15)
BUN: 18 mg/dL (ref 6–20)
CO2: 23 mmol/L (ref 22–32)
Calcium: 8 mg/dL — ABNORMAL LOW (ref 8.9–10.3)
Chloride: 106 mmol/L (ref 98–111)
Creatinine, Ser: 0.56 mg/dL (ref 0.44–1.00)
GFR, Estimated: >60 mL/min (ref >60)
Glucose, Bld: 89 mg/dL (ref 70–99)
Potassium: 3.8 mmol/L (ref 3.5–5.1)
Sodium: 137 mmol/L (ref 135–145)
Total Bilirubin: 0.2 mg/dL — ABNORMAL LOW (ref 0.3–1.2)
Total Protein: 5.8 g/dL — ABNORMAL LOW (ref 6.5–8.1)

## 2021-01-04 LAB — CBC WITH DIFFERENTIAL/PLATELET
Abs Immature Granulocytes: 0.01 10*3/uL (ref 0.00–0.07)
Basophils Absolute: 0 10*3/uL (ref 0.0–0.1)
Basophils Relative: 1 %
Eosinophils Absolute: 0.2 10*3/uL (ref 0.0–0.5)
Eosinophils Relative: 5 %
HCT: 33.3 % — ABNORMAL LOW (ref 36.0–46.0)
Hemoglobin: 10.4 g/dL — ABNORMAL LOW (ref 12.0–15.0)
Immature Granulocytes: 0 %
Lymphocytes Relative: 24 %
Lymphs Abs: 0.8 10*3/uL (ref 0.7–4.0)
MCH: 24.9 pg — ABNORMAL LOW (ref 26.0–34.0)
MCHC: 31.2 g/dL (ref 30.0–36.0)
MCV: 79.9 fL — ABNORMAL LOW (ref 80.0–100.0)
Monocytes Absolute: 0.3 10*3/uL (ref 0.1–1.0)
Monocytes Relative: 8 %
Neutro Abs: 2.2 10*3/uL (ref 1.7–7.7)
Neutrophils Relative %: 62 %
Platelets: 245 10*3/uL (ref 150–400)
RBC: 4.17 MIL/uL (ref 3.87–5.11)
Smear Review: NORMAL
WBC: 3.5 10*3/uL — ABNORMAL LOW (ref 4.0–10.5)
nRBC: 0 % (ref 0.0–0.2)

## 2021-01-04 MED ORDER — SODIUM CHLORIDE 0.9 % IV SOLN
40.0000 mg/m2 | Freq: Once | INTRAVENOUS | Status: AC
  Administered 2021-01-04: 12:00:00 66 mg via INTRAVENOUS
  Filled 2021-01-04: qty 11

## 2021-01-04 MED ORDER — SODIUM CHLORIDE 0.9% FLUSH
10.0000 mL | INTRAVENOUS | Status: DC | PRN
  Administered 2021-01-04: 10:00:00 10 mL via INTRAVENOUS
  Filled 2021-01-04: qty 10

## 2021-01-04 MED ORDER — HYDROCODONE-ACETAMINOPHEN 5-325 MG PO TABS: 1.0000 | ORAL_TABLET | Freq: Four times a day (QID) | ORAL | 0 refills | Status: DC | PRN

## 2021-01-04 MED ORDER — CYANOCOBALAMIN 1000 MCG PO TABS: 1000.0000 ug | ORAL_TABLET | Freq: Every day | ORAL | 0 refills | Status: DC

## 2021-01-04 MED ORDER — SUCRALFATE 1 G PO TABS: 1.0000 g | ORAL_TABLET | Freq: Three times a day (TID) | ORAL | 1 refills | Status: DC

## 2021-01-04 MED ORDER — SODIUM CHLORIDE 0.9 % IV SOLN
220.8000 mg | Freq: Once | INTRAVENOUS | Status: AC
  Administered 2021-01-04: 13:00:00 220 mg via INTRAVENOUS
  Filled 2021-01-04: qty 22

## 2021-01-04 MED ORDER — DIPHENHYDRAMINE HCL 50 MG/ML IJ SOLN
25.0000 mg | Freq: Once | INTRAMUSCULAR | Status: AC
  Administered 2021-01-04: 11:00:00 25 mg via INTRAVENOUS
  Filled 2021-01-04: qty 1

## 2021-01-04 MED ORDER — SODIUM CHLORIDE 0.9 % IV SOLN
10.0000 mg | Freq: Once | INTRAVENOUS | Status: AC
  Administered 2021-01-04: 11:00:00 10 mg via INTRAVENOUS
  Filled 2021-01-04: qty 10

## 2021-01-04 MED ORDER — FOLIC ACID 1 MG PO TABS: 1.0000 mg | ORAL_TABLET | Freq: Every day | ORAL | 0 refills | Status: DC

## 2021-01-04 MED ORDER — HEPARIN SOD (PORK) LOCK FLUSH 100 UNIT/ML IV SOLN
INTRAVENOUS | Status: AC
  Filled 2021-01-04: qty 5

## 2021-01-04 MED ORDER — SODIUM CHLORIDE 0.9 % IV SOLN
Freq: Once | INTRAVENOUS | Status: AC
Start: 2021-01-04 — End: 2021-01-04
  Filled 2021-01-04: qty 250

## 2021-01-04 MED ORDER — TRAMADOL HCL 50 MG PO TABS: 50.0000 mg | ORAL_TABLET | Freq: Four times a day (QID) | ORAL | 0 refills | Status: DC | PRN

## 2021-01-04 MED ORDER — PALONOSETRON HCL INJECTION 0.25 MG/5ML
0.2500 mg | Freq: Once | INTRAVENOUS | Status: AC
  Administered 2021-01-04: 11:00:00 0.25 mg via INTRAVENOUS
  Filled 2021-01-04: qty 5

## 2021-01-04 MED ORDER — FAMOTIDINE 20 MG IN NS 100 ML IVPB
20.0000 mg | Freq: Once | INTRAVENOUS | Status: AC
  Administered 2021-01-04: 20 mg via INTRAVENOUS
  Filled 2021-01-04: qty 20

## 2021-01-04 MED ORDER — HEPARIN SOD (PORK) LOCK FLUSH 100 UNIT/ML IV SOLN
500.0000 [IU] | Freq: Once | INTRAVENOUS | Status: AC
  Administered 2021-01-04: 14:00:00 500 [IU] via INTRAVENOUS
  Filled 2021-01-04: qty 5

## 2021-01-04 NOTE — Progress Notes (Signed)
Nutrition Assessment   Reason for Assessment: Referral from [facility] clinic   ASSESSMENT: 45 year old female diagnosed with cervical cancer. PMHX includes Chronic anemia, small bowel obstruction, severe protein calorie malnutrition, hyponatremia, Roux-enYgastric bypass, iron deficiency anemia, diarrhea, vaginal bleeding, depression, alcoholism, tobacco use, and substance abuse.  Seen today during infusion second round of 6 weekly treatments Carboplatin/PACLitaxel with radiation.   Patient is having trouble with nausea, diarrhea past 8 days prior to that had week of constipation.  She reports over 8 episodes a day. She has been using Imodium but is exceeding daily limits. She didn't discuss alternative medications with her providers during office visits today.  She has been attentive to taking in adequate fluids to replace losses. Drinking 3 cups coffee in the morning, 120-160oz water and 2 small diet cokes. Also taking Evolve Oat milk supplement TID.  She has been using that because Ensure and Glucerna products upset her stomach.    Usual PO B: oatmeal  with berries if she has  L: sandwich or leftovers D: chicken mashed potatoes, cabbage last night  She tries to graze throughout  day.Avoids milk products has lactose intolerance     Medications: Zofran, Compazine, Carafate just added 01/04/21, Vitamin B12, Thiamine, Folate, MVI, Vitamin D, Ensure plus TID   Has had trouble finding OTC Vit B12  Labs:   Latest Reference Range & Units 12/21/20 08:29 12/28/20 08:22  Albumin 3.5 - 5.0 g/dL 3.6 3.4 (L)  (L): Data is abnormally low   Latest Reference Range & Units 12/21/20 08:29 12/28/20 08:22  Hemoglobin 12.0 - 15.0 g/dL 11.0 (L) 10.5 (L)  HCT 36.0 - 46.0 % 34.5 (L) 34.3 (L)  (L): Data is abnormally low   Anthropometrics:   Height: 5" 7" Weight: 130# UBW: 140-150# after by-pass  BMI: 20.99  Weight /BMI 01/04/2021 12/28/2020 12/21/2020 12/20/2020  WEIGHT 130 lb 14.4 oz 134 lb 130 lb 130 lb   HEIGHT 5\' 7"    5\' 7"   BMI 20.5 kg/m2 20.99 kg/m2 20.36 kg/m2 20.36 kg/m2   Weight /BMI 12/07/2020 12/01/2020 11/26/2020  WEIGHT 133 lb 130 lb 1.6 oz 120 lb  HEIGHT   5\' 7"   BMI 20.83 kg/m2 20.38 kg/m2 18.79 kg/m2     Estimated Energy Needs  Kcals: 1800-2100 Protein: 61-73 grams Fluid: >/=1800 ml   NUTRITION DIAGNOSIS: Inadequate protein and calorie intake   MALNUTRITION DIAGNOSIS: at risk for malnutrition   INTERVENTION: Review anti diarrhea strategies and provided handout on diarrhea   MONITORING, EVALUATION, GOAL:  Will continue to follow weight, labs, and PO intake.   Next Visit: Follow up appointment 01/19/20 during infusion.

## 2021-01-04 NOTE — Progress Notes (Addendum)
[place]  [phone] [phone]  ID: [name] OB: [birth year]  MR#: [id number]  TDS#:[id number]  Patient Care Team: Anselmo Pickler, MD as PCP - General (Internal Medicine)  CHIEF COMPLAINT: Stage IIb adenocarcinoma of the cervix.  INTERVAL HISTORY: Mrs. Vanessa Romero is a 45 year old female status post hypertension, asthma, tobacco dependence, osteoarthritis, anxiety, alcoholism who is being followed by Dr. Grayland Ormond for stage IIb cervical cancer.  She is status post 2 cycles of concurrent chemoradiation with carbo/Taxol.  Reports tolerating treatments well up until about 5 days ago.  Has developed some significant abdominal pain that comes in waves and described as "stabbing" pain in her lower abdomen.  Reports fluctuating constipation and diarrhea most recently having diarrhea.  States pain is worse with food.  She has tried taking over-the-counter medication such as Zantac, Tums, Pepto-Bismol, MiraLAX and Imodium without any symptom control.  She is taking oxycodone and tramadol which is dulling the pain but not getting rid of it.  Reports constant nausea without vomiting.  Using antiemetics and is also eating ginger candies.  Mainly getting her nutrients with supplements.   REVIEW OF SYSTEMS:   Review of Systems  Constitutional:  Positive for malaise/fatigue and weight loss. Negative for chills and fever.  HENT:  Negative for congestion, ear pain and tinnitus.   Eyes: Negative.  Negative for blurred vision and double vision.  Respiratory: Negative.  Negative for cough, sputum production and shortness of breath.   Cardiovascular: Negative.  Negative for chest pain, palpitations and leg swelling.  Gastrointestinal:  Positive for abdominal pain, constipation, diarrhea, nausea and vomiting.  Genitourinary:  Negative for dysuria, frequency and urgency.  Musculoskeletal:  Positive for back pain. Negative for falls.  Skin: Negative.   Negative for rash.  Neurological:  Positive for weakness. Negative for headaches.  Endo/Heme/Allergies: Negative.  Does not bruise/bleed easily.  Psychiatric/Behavioral:  Negative for depression. The patient is nervous/anxious. The patient does not have insomnia.    As per HPI. Otherwise, a complete review of systems is negative.  PAST MEDICAL HISTORY: Past Medical History:  Diagnosis Date   Alcohol abuse    Anemia    Cancer ([place])    Tobacco dependence     PAST SURGICAL HISTORY: Past Surgical History:  Procedure Laterality Date   ABDOMINAL ADHESION SURGERY     bowel obstruction     x2   CERVICAL CONIZATION W/BX N/A 11/26/2020   Procedure: CONIZATION CERVIX WITH BIOPSY;  Surgeon: Malachy Mood, MD;  Location: [facility] ORS;  Service: Gynecology;  Laterality: N/A;   ESOPHAGOGASTRODUODENOSCOPY N/A 11/24/2020   Procedure: ESOPHAGOGASTRODUODENOSCOPY (EGD);  Surgeon: Lin Landsman, MD;  Location: [facility] ENDOSCOPY;  Service: Gastroenterology;  Laterality: N/A;   laparoscopic knee surgery     PORTA CATH INSERTION N/A 12/20/2020   Procedure: PORTA CATH INSERTION;  Surgeon: Algernon Huxley, MD;  Location: [place] [facility];  Service: Cardiovascular;  Laterality: N/A;   ROUX-EN-Y GASTRIC BYPASS     TEAR DUCT PROBING     unclogg   VAGOTOMY     VENTRICULOPERITONEAL SHUNT     x6 put in and removals    FAMILY HISTORY: Family History  Problem Relation Age of Onset   Cancer Mother    Cancer Father    Cancer Maternal Grandmother     ADVANCED DIRECTIVES (Y/N):  N  HEALTH MAINTENANCE: Social History   Tobacco Use   Smoking status: Former    Types: Cigarettes   Smokeless tobacco: Former  Media planner  Vaping Use: Former  Substance Use Topics   Alcohol use: Not Currently   Drug use: Not Currently     Colonoscopy:  PAP:  Bone density:  Lipid panel:  Allergies  Allergen Reactions   Contrast Media [Iodinated Diagnostic Agents] Hives   Gabapentin Other (See Comments), Rash  and Palpitations    Other Reaction: tachycardia Other Reaction: tachycardia    Morphine Dermatitis, Hives, Rash, Swelling and Other (See Comments)    Other reaction(s): Unknown (comments) Has tolerated hydromorphone (Dilaudid) Immediate after injections arm edema and arm turned bright red Immediate after injections arm edema and arm turned bright red IV Morphine IV Morphine    Sumatriptan Dermatitis, Hives, Itching, Other (See Comments) and Swelling    Other reaction(s): Joint Pain, Other (See Comments), Other (see comments), Unknown (comments) lock jaw Lock jaw Lock jaw Lock jaw TIGHTENING OF JAW Lock jaw lock jaw Lock jaw TIGHTENING OF JAW Lock jaw    Zolpidem Nausea And Vomiting and Other (See Comments)    Other reaction(s): Other (see comments) sleep walking sleep walking Sleep walking  don't tolerate it well    Erythromycin Diarrhea, Nausea And Vomiting and Nausea Only    Extreme upset stomach    Valproic Acid Rash    Other reaction(s): Other (see comments), Unknown MOOD DISORDER MOOD DISORDER Depakote: Reaction unknown     Acetazolamide     Other reaction(s): Unknown (comments)   Erythromycin Base     Other reaction(s): UNKNOWN   Amoxicillin Rash   Divalproex Sodium Anxiety and Other (See Comments)    Current Outpatient Medications  Medication Sig Dispense Refill   cholecalciferol (VITAMIN D) 25 MCG tablet Take 1 tablet (1,000 Units total) by mouth daily. 30 tablet 1   escitalopram (LEXAPRO) 20 MG tablet Take 1 tablet (20 mg total) by mouth daily at 12 noon. 30 tablet 1   feeding supplement (ENSURE ENLIVE / ENSURE PLUS) LIQD Take 237 mLs by mouth 3 (three) times daily between meals. 76195 mL 0   folic acid (FOLVITE) 1 MG tablet Take 1 tablet (1 mg total) by mouth daily. 30 tablet 0   HYDROcodone-acetaminophen (NORCO/VICODIN) 5-325 MG tablet Take 1 tablet by mouth every 6 (six) hours as needed for moderate pain. 30 tablet 0   lamoTRIgine (LAMICTAL) 25 MG  tablet Take 2 tablets (50 mg total) by mouth daily. 60 tablet 1   lidocaine-prilocaine (EMLA) cream Apply to affected area once 30 g 3   Multiple Vitamin (MULTIVITAMIN WITH MINERALS) TABS tablet Take 1 tablet by mouth daily.     ondansetron (ZOFRAN) 8 MG tablet Take 1 tablet (8 mg total) by mouth 2 (two) times daily as needed for refractory nausea / vomiting. 60 tablet 1   prochlorperazine (COMPAZINE) 10 MG tablet Take 1 tablet (10 mg total) by mouth every 6 (six) hours as needed (Nausea or vomiting). 60 tablet 1   sucralfate (CARAFATE) 1 g tablet Take 1 tablet (1 g total) by mouth 3 (three) times daily. 90 tablet 1   thiamine 100 MG tablet Take 1 tablet (100 mg total) by mouth daily. 30 tablet 0   traMADol (ULTRAM) 50 MG tablet Take 1 tablet (50 mg total) by mouth every 6 (six) hours as needed. 30 tablet 0   vitamin B-12 1000 MCG tablet Take 1 tablet (1,000 mcg total) by mouth daily. 30 tablet 0   No current facility-administered medications for this visit.   Facility-Administered Medications Ordered in Other Visits  Medication Dose Route Frequency Provider Last  Rate Last Admin   heparin lock flush 100 UNIT/ML injection            heparin lock flush 100 unit/mL  500 Units Intravenous Once Faythe Casa E, NP       sodium chloride flush (NS) 0.9 % injection 10 mL  10 mL Intravenous PRN Jacquelin Hawking, NP   10 mL at 01/04/21 0939    OBJECTIVE: Vitals:   01/04/21 0950  BP: 105/74  Pulse: 85  Temp: (!) 96.7 F (35.9 C)  SpO2: 99%     Body mass index is 20.5 kg/m.    ECOG FS:0 - Asymptomatic  Physical Exam Constitutional:      Appearance: Normal appearance.  HENT:     Head: Normocephalic and atraumatic.  Eyes:     Pupils: Pupils are equal, round, and reactive to light.  Cardiovascular:     Rate and Rhythm: Normal rate and regular rhythm.     Heart sounds: Normal heart sounds. No murmur heard. Pulmonary:     Effort: Pulmonary effort is normal.     Breath sounds: Normal  breath sounds. No wheezing.  Abdominal:     General: Bowel sounds are normal. There is no distension.     Palpations: Abdomen is soft.     Tenderness: There is no abdominal tenderness.  Musculoskeletal:        General: Normal range of motion.     Cervical back: Normal range of motion.  Skin:    General: Skin is warm and dry.     Coloration: Skin is pale.     Findings: No rash.  Neurological:     Mental Status: She is alert and oriented to person, place, and time.  Psychiatric:        Judgment: Judgment normal.   LAB RESULTS:  Lab Results  Component Value Date   NA 137 01/04/2021   K 3.8 01/04/2021   CL 106 01/04/2021   CO2 23 01/04/2021   GLUCOSE 89 01/04/2021   BUN 18 01/04/2021   CREATININE 0.56 01/04/2021   CALCIUM 8.0 (L) 01/04/2021   PROT 5.8 (L) 01/04/2021   ALBUMIN 3.2 (L) 01/04/2021   AST 20 01/04/2021   ALT 13 01/04/2021   ALKPHOS 40 01/04/2021   BILITOT 0.2 (L) 01/04/2021   GFRNONAA >60 01/04/2021    Lab Results  Component Value Date   WBC 3.5 (L) 01/04/2021   NEUTROABS 2.2 01/04/2021   HGB 10.4 (L) 01/04/2021   HCT 33.3 (L) 01/04/2021   MCV 79.9 (L) 01/04/2021   PLT 245 01/04/2021     STUDIES: PERIPHERAL VASCULAR CATHETERIZATION  Result Date: 12/20/2020 See surgical note for result.  NM PET Image Initial (PI) Skull Base To Thigh  Result Date: 12/06/2020 CLINICAL DATA:  Initial treatment strategy for cervical cancer. EXAM: NUCLEAR MEDICINE PET SKULL BASE TO THIGH TECHNIQUE: 7.08 mCi F-18 FDG was injected intravenously. Full-ring PET imaging was performed from the skull base to thigh after the radiotracer. CT data was obtained and used for attenuation correction and anatomic localization. Fasting blood glucose: 79 mg/dl COMPARISON:  Pelvic MRI 11/24/2020. Pelvic ultrasound 11/24/2020. Chest CT 05/22/2018 (report only). FINDINGS: Mediastinal blood pool activity: SUV max 1.4 NECK: No hypermetabolic cervical lymph nodes are identified.There are no  lesions of the pharyngeal mucosal space. Incidental CT findings: none CHEST: There are no hypermetabolic mediastinal, hilar or axillary lymph nodes. No hypermetabolic pulmonary activity or suspicious nodularity. Incidental CT findings: Small posterior mediastinal calcifications with postsurgical changes at the gastroesophageal  junction. ABDOMEN/PELVIS: There is no hypermetabolic activity within the liver, adrenal glands, spleen or pancreas. There is no hypermetabolic nodal activity. There is hypermetabolic activity within the previously demonstrated cervical mass (SUV max 5.6). There is no extension of this abnormal metabolic activity beyond the cervix. No suspicious adnexal activity. Incidental CT findings: No hydronephrosis. Mild extrahepatic biliary dilatation post cholecystectomy. Postsurgical changes in the stomach suggesting previous gastric bypass procedure. SKELETON: There is no hypermetabolic activity to suggest osseous metastatic disease. Incidental CT findings: none IMPRESSION: 1. The patient's known cervical cancer is hypermetabolic. No evidence of extrauterine extension of tumor, abdominopelvic adenopathy or distant metastases. 2. No hydronephrosis or suspicious adnexal findings. 3. Postsurgical changes as described. Electronically Signed   By: Richardean Sale M.D.   On: 12/06/2020 13:49    ASSESSMENT: Stage IIb adenocarcinoma of the cervix.  PLAN: Mrs. Vanessa Romero is a 45 year old female who is here for assessment prior to cycle 3 carbo/Taxol with concurrent radiation.  Clinically she is doing fair.  Labs from today are adequate for treatment although slightly trending down.  Proceed with cycle 3 carbo/Taxol.  Chemo induced anemia-hemoglobin 10.4.  Monitor.  Neutropenia-White count 3.5 with a normal ANC.  Pelvic pain-likely secondary to XRT and chemotherapy.  Carafate was added by radiation oncology.  Developed worsening abdominal pain over the past 5 days.  Notes diarrhea.  No bleeding.  Pain  is unrelieved with narcotics and over-the-counter medications.  Recommend abdominal imaging to rule out abnormality.  Patient is asking for a refill of her narcotics.  Reviewed PDMP.   Diarrhea-likely secondary to XRT.  Recommend getting a sample for testing.   Disposition- Proceed with treatment today. Recommend sample of stool.  Stat abdominal x-ray. Will call patient with results. RTC in 1 week for follow-up with lab work, see MD and cycle 4 of carbo/Taxol. Continue radiation.   Addendum- Abdominal x-ray showed nonobstructive bowel gas pattern with moderate volume of retained stool.  Prior gastric bypass.  She is currently having diarrhea.  Abdominal pain likely multifactorial but prior issues with constipation could be contributing.  Recommend not taking Imodium or Lomotil at this time.  Also request a sample of her stool for testing.  I spent 25 minutes dedicated to the care of this patient (face-to-face and non-face-to-face) on the date of the encounter to include what is described in the assessment and plan.  Patient expressed understanding and was in agreement with this plan. She also understands that She can call clinic at any time with any questions, concerns, or complaints.    Cancer Staging  Cervical cancer, FIGO stage IIB ([place]) Staging form: Cervix Uteri, AJCC Version 9 - Clinical stage from 12/06/2020: FIGO Stage IIB (cT2b, cN0, cM0) - Signed by Lloyd Huger, MD on 12/06/2020 Stage prefix: Initial diagnosis  Jacquelin Hawking, NP   01/04/2021 10:23 AM

## 2021-01-04 NOTE — Progress Notes (Signed)
Pt states she is has alternating diarrhea and constipation. Needs refills on Vit B12(hard to find otc), Folic acid, norco and tramadol.

## 2021-01-05 ENCOUNTER — Ambulatory Visit
Admission: RE | Admit: 2021-01-05 | Discharge: 2021-01-05 | Disposition: A | Discharge status: Home / Self Care | Payer: Medicare Other | Source: Home / Self Care | Attending: Oncology | Admitting: Oncology

## 2021-01-05 ENCOUNTER — Ambulatory Visit
Admission: RE | Admit: 2021-01-05 | Discharge: 2021-01-05 | Disposition: A | Discharge status: Home / Self Care | Payer: Medicare Other | Source: Ambulatory Visit | Attending: Radiation Oncology | Admitting: Radiation Oncology

## 2021-01-05 ENCOUNTER — Telehealth: Payer: Self-pay | Admitting: *Deleted

## 2021-01-05 ENCOUNTER — Other Ambulatory Visit: Payer: Self-pay | Admitting: Oncology

## 2021-01-05 ENCOUNTER — Encounter: Payer: Self-pay | Admitting: Oncology

## 2021-01-05 ENCOUNTER — Ambulatory Visit
Admission: RE | Admit: 2021-01-05 | Discharge: 2021-01-05 | Disposition: A | Discharge status: Home / Self Care | Payer: Medicare Other | Source: Ambulatory Visit | Attending: Oncology | Admitting: Oncology

## 2021-01-05 DIAGNOSIS — Z510 Encounter for antineoplastic radiation therapy: Secondary | ICD-10-CM | POA: Diagnosis present

## 2021-01-05 DIAGNOSIS — A09 Infectious gastroenteritis and colitis, unspecified: Secondary | ICD-10-CM | POA: Diagnosis not present

## 2021-01-05 DIAGNOSIS — C539 Malignant neoplasm of cervix uteri, unspecified: Secondary | ICD-10-CM | POA: Diagnosis present

## 2021-01-05 DIAGNOSIS — R1030 Lower abdominal pain, unspecified: Secondary | ICD-10-CM

## 2021-01-05 DIAGNOSIS — Z5111 Encounter for antineoplastic chemotherapy: Secondary | ICD-10-CM | POA: Diagnosis present

## 2021-01-05 DIAGNOSIS — A0472 Enterocolitis due to Clostridium difficile, not specified as recurrent: Secondary | ICD-10-CM | POA: Diagnosis not present

## 2021-01-05 LAB — GASTROINTESTINAL PANEL BY PCR, STOOL (REPLACES STOOL CULTURE)

## 2021-01-05 LAB — CLOSTRIDIUM DIFFICILE BY PCR, REFLEXED: Toxigenic C. Difficile by PCR: POSITIVE — AB

## 2021-01-05 LAB — C DIFFICILE QUICK SCREEN W PCR REFLEX
C Diff antigen: POSITIVE — AB
C Diff toxin: NEGATIVE

## 2021-01-05 MED ORDER — VANCOMYCIN HCL 125 MG PO CAPS
125.0000 mg | ORAL_CAPSULE | Freq: Four times a day (QID) | ORAL | 0 refills | Status: DC
Start: 2021-01-05 — End: 2021-01-14

## 2021-01-05 NOTE — Telephone Encounter (Signed)
Per Sonia Baller, This only means that she has had cdiff in the past and does not have an acute c-diff infection. I am not certain that her diarrhea isn't from radiation and abdominal x-ray doesn't show inflammation compatiable with c-diff infection. Call returned to patient and advised of this and she asked if she needs to take a laxative since she has so much stool in her colon

## 2021-01-05 NOTE — Progress Notes (Signed)
Re- C-diif  Patient called to review results of recent stool sample and abdominal x-ray.   Reviewed with Dr. Tasia Catchings prior to speaking the patient-C. difficile results show positive C. difficile antigen with little to no C. difficile toxin present.  Typically we would not treat this as an active C. difficile infection but given persistent if not worsening symptoms we will go ahead and treat.  Patient states she has developed worsening abdominal cramping and diarrhea over the past 24 hours.  Denies of fever.  Has nausea and her appetite is low.  Suggest trying to keep fluids down throughout this evening and starting her antibiotic.  Patient will call us in the morning if symptoms worsen and potentially may need IV fluids.  Recommend no radiation for the next 2 days and can pick up radiation after the holiday.  Patient in agreement.  We will let radiation oncology know.  Recommend fluids, antiemetics as needed for nausea and to start oral antibiotics ASAP.  Do not take antidiarrheal such as Imodium or Lomotil.  Faythe Casa, NP 01/05/2021 4:11 PM

## 2021-01-05 NOTE — Telephone Encounter (Signed)
Patient saw stool sample results and is asking what is to be done  Notes Component Ref Range & Units 1 d ago   Toxigenic C. Difficile by PCR NEGATIVE POSITIVE Abnormal    Comment: Positive for toxigenic C. difficile with little to no toxin production. Only treat if clinical presentation suggests symptomatic illness.  Performed at [facility], [address] [place], [place] [address]   Resulting Agency  [facility]         Specimen Collected: 01/04/21 13:58 Last Resulted: 01/05/21 14:11

## 2021-01-06 ENCOUNTER — Emergency Department: Payer: Medicare Other

## 2021-01-06 ENCOUNTER — Emergency Department
Admission: EM | Admit: 2021-01-06 | Discharge: 2021-01-06 | Disposition: A | Discharge status: Home / Self Care | Payer: Medicare Other | Source: Home / Self Care | Attending: Emergency Medicine | Admitting: Emergency Medicine

## 2021-01-06 ENCOUNTER — Other Ambulatory Visit: Payer: Self-pay

## 2021-01-06 ENCOUNTER — Telehealth: Payer: Self-pay | Admitting: *Deleted

## 2021-01-06 ENCOUNTER — Ambulatory Visit: Payer: Medicare Other

## 2021-01-06 DIAGNOSIS — Z20822 Contact with and (suspected) exposure to covid-19: Secondary | ICD-10-CM | POA: Insufficient documentation

## 2021-01-06 DIAGNOSIS — R197 Diarrhea, unspecified: Secondary | ICD-10-CM | POA: Insufficient documentation

## 2021-01-06 DIAGNOSIS — Z87891 Personal history of nicotine dependence: Secondary | ICD-10-CM | POA: Insufficient documentation

## 2021-01-06 DIAGNOSIS — R109 Unspecified abdominal pain: Secondary | ICD-10-CM

## 2021-01-06 DIAGNOSIS — Z85828 Personal history of other malignant neoplasm of skin: Secondary | ICD-10-CM | POA: Insufficient documentation

## 2021-01-06 DIAGNOSIS — I10 Essential (primary) hypertension: Secondary | ICD-10-CM | POA: Insufficient documentation

## 2021-01-06 DIAGNOSIS — Z79899 Other long term (current) drug therapy: Secondary | ICD-10-CM | POA: Insufficient documentation

## 2021-01-06 LAB — TROPONIN I (HIGH SENSITIVITY): Troponin I (High Sensitivity): <2 ng/L (ref <18)

## 2021-01-06 LAB — COMPREHENSIVE METABOLIC PANEL
ALT: 17 U/L (ref 0–44)
AST: 26 U/L (ref 15–41)
Albumin: 3.2 g/dL — ABNORMAL LOW (ref 3.5–5.0)
Alkaline Phosphatase: 38 U/L (ref 38–126)
Anion gap: 5 (ref 5–15)
BUN: 13 mg/dL (ref 6–20)
CO2: 21 mmol/L — ABNORMAL LOW (ref 22–32)
Calcium: 8.2 mg/dL — ABNORMAL LOW (ref 8.9–10.3)
Chloride: 108 mmol/L (ref 98–111)
Creatinine, Ser: 0.61 mg/dL (ref 0.44–1.00)
GFR, Estimated: >60 mL/min (ref >60)
Glucose, Bld: 107 mg/dL — ABNORMAL HIGH (ref 70–99)
Potassium: 4.1 mmol/L (ref 3.5–5.1)
Sodium: 134 mmol/L — ABNORMAL LOW (ref 135–145)
Total Bilirubin: 0.4 mg/dL (ref 0.3–1.2)
Total Protein: 5.8 g/dL — ABNORMAL LOW (ref 6.5–8.1)

## 2021-01-06 LAB — URINALYSIS, ROUTINE W REFLEX MICROSCOPIC
Bilirubin Urine: NEGATIVE
Glucose, UA: NEGATIVE mg/dL
Hgb urine dipstick: NEGATIVE
Ketones, ur: NEGATIVE mg/dL
Leukocytes,Ua: NEGATIVE
Nitrite: NEGATIVE
Protein, ur: NEGATIVE mg/dL
Specific Gravity, Urine: 1.02 (ref 1.005–1.030)
pH: 6 (ref 5.0–8.0)

## 2021-01-06 LAB — CBC
HCT: 34 % — ABNORMAL LOW (ref 36.0–46.0)
Hemoglobin: 10.7 g/dL — ABNORMAL LOW (ref 12.0–15.0)
MCH: 24.8 pg — ABNORMAL LOW (ref 26.0–34.0)
MCHC: 31.5 g/dL (ref 30.0–36.0)
MCV: 78.9 fL — ABNORMAL LOW (ref 80.0–100.0)
Platelets: 243 10*3/uL (ref 150–400)
RBC: 4.31 MIL/uL (ref 3.87–5.11)
WBC: 3.8 10*3/uL — ABNORMAL LOW (ref 4.0–10.5)
nRBC: 0 % (ref 0.0–0.2)

## 2021-01-06 LAB — RESP PANEL BY RT-PCR (FLU A&B, COVID) ARPGX2
Influenza A by PCR: NEGATIVE
Influenza B by PCR: NEGATIVE
SARS Coronavirus 2 by RT PCR: NEGATIVE

## 2021-01-06 LAB — HCG, QUANTITATIVE, PREGNANCY: hCG, Beta Chain, Quant, S: 1 m[IU]/mL (ref <5)

## 2021-01-06 LAB — LIPASE, BLOOD: Lipase: 25 U/L (ref 11–51)

## 2021-01-06 LAB — LACTIC ACID, PLASMA: Lactic Acid, Venous: 0.7 mmol/L (ref 0.5–1.9)

## 2021-01-06 MED ORDER — HYDROMORPHONE HCL 1 MG/ML IJ SOLN
0.5000 mg | Freq: Once | INTRAMUSCULAR | Status: AC
  Administered 2021-01-06: 18:00:00 0.5 mg via INTRAVENOUS
  Filled 2021-01-06: qty 1

## 2021-01-06 MED ORDER — IOHEXOL 300 MG/ML  SOLN
75.0000 mL | Freq: Once | INTRAMUSCULAR | Status: AC | PRN
  Administered 2021-01-06: 19:00:00 75 mL via INTRAVENOUS

## 2021-01-06 MED ORDER — HYDROMORPHONE HCL 1 MG/ML IJ SOLN
0.5000 mg | Freq: Once | INTRAMUSCULAR | Status: AC
  Administered 2021-01-06: 17:00:00 0.5 mg via INTRAVENOUS
  Filled 2021-01-06: qty 1

## 2021-01-06 MED ORDER — HALOPERIDOL LACTATE 5 MG/ML IJ SOLN
5.0000 mg | Freq: Once | INTRAMUSCULAR | Status: AC
  Administered 2021-01-06: 19:00:00 5 mg via INTRAVENOUS
  Filled 2021-01-06: qty 1

## 2021-01-06 MED ORDER — DICYCLOMINE HCL 10 MG PO CAPS
20.0000 mg | ORAL_CAPSULE | Freq: Once | ORAL | Status: AC
  Administered 2021-01-06: 21:00:00 20 mg via ORAL
  Filled 2021-01-06: qty 2

## 2021-01-06 MED ORDER — SODIUM CHLORIDE 0.9 % IV BOLUS
1000.0000 mL | Freq: Once | INTRAVENOUS | Status: AC
  Administered 2021-01-06: 15:00:00 1000 mL via INTRAVENOUS

## 2021-01-06 MED ORDER — DIPHENHYDRAMINE HCL 50 MG/ML IJ SOLN
50.0000 mg | Freq: Once | INTRAMUSCULAR | Status: AC
  Administered 2021-01-06: 18:00:00 50 mg via INTRAVENOUS
  Filled 2021-01-06: qty 1

## 2021-01-06 MED ORDER — HYDROMORPHONE HCL 1 MG/ML IJ SOLN
0.5000 mg | Freq: Once | INTRAMUSCULAR | Status: AC
  Administered 2021-01-06: 15:00:00 0.5 mg via INTRAVENOUS
  Filled 2021-01-06: qty 1

## 2021-01-06 MED ORDER — DICYCLOMINE HCL 10 MG PO CAPS: 10.0000 mg | ORAL_CAPSULE | Freq: Three times a day (TID) | ORAL | 0 refills | Status: DC | PRN

## 2021-01-06 MED ORDER — HYDROCORTISONE SOD SUC (PF) 250 MG IJ SOLR
200.0000 mg | Freq: Once | INTRAMUSCULAR | Status: AC
  Administered 2021-01-06: 15:00:00 200 mg via INTRAVENOUS
  Filled 2021-01-06: qty 200

## 2021-01-06 MED ORDER — HYDROMORPHONE HCL 1 MG/ML IJ SOLN
0.5000 mg | Freq: Once | INTRAMUSCULAR | Status: AC
  Administered 2021-01-06: 20:00:00 0.5 mg via INTRAVENOUS
  Filled 2021-01-06: qty 1

## 2021-01-06 MED ORDER — HYDROMORPHONE HCL 1 MG/ML IJ SOLN
0.5000 mg | Freq: Once | INTRAMUSCULAR | Status: AC
  Administered 2021-01-06: 21:00:00 0.5 mg via INTRAVENOUS
  Filled 2021-01-06: qty 1

## 2021-01-06 MED ORDER — ONDANSETRON HCL 4 MG/2ML IJ SOLN
4.0000 mg | Freq: Once | INTRAMUSCULAR | Status: AC
  Administered 2021-01-06: 15:00:00 4 mg via INTRAVENOUS
  Filled 2021-01-06: qty 2

## 2021-01-06 MED ORDER — VANCOMYCIN HCL 125 MG PO CAPS
125.0000 mg | ORAL_CAPSULE | Freq: Once | ORAL | Status: AC
  Administered 2021-01-06: 21:00:00 125 mg via ORAL
  Filled 2021-01-06: qty 1

## 2021-01-06 MED ORDER — DIPHENHYDRAMINE HCL 25 MG PO CAPS: 50.0000 mg | ORAL_CAPSULE | Freq: Once | ORAL | Status: AC

## 2021-01-06 NOTE — ED Notes (Signed)
Called x-ray for portable chest to be done

## 2021-01-06 NOTE — ED Notes (Signed)
Up to bathroom with assistance.  Tolerated walk.

## 2021-01-06 NOTE — ED Triage Notes (Signed)
Pt c/o abd cramping and diarrhea with mucous consistency for the past week, states she is currently getting radiation and chemo treatment for  cervical CA, was recently on vancomycin before the sx started, states they took a stool specimen yesterday and dx with C-diff

## 2021-01-06 NOTE — Telephone Encounter (Signed)
Patient called  to update on her condition. She reports that her pain is severe, she is doubled over with cramps and pain and the medicine she has is not controlling it. She states she is unable to keep anything down. Please advise

## 2021-01-06 NOTE — ED Notes (Signed)
Called lab to add on HCG

## 2021-01-06 NOTE — Telephone Encounter (Signed)
I called patient back and left message on voice mail that she needs to go to ER for evaluation Per Rulon Abide, NP "Worried about SBO. Needs ED"

## 2021-01-06 NOTE — ED Provider Notes (Signed)
Patient's CT scan is consistent with infectious versus inflammatory colitis.  Do think this is consistent with patient's diagnosis of C. difficile.  I did have a discussion with the patient.  I discussed the findings of the CT scan.  I did offer admission to the patient given multiple IV doses of narcotics.  However at this time patient states she would like to try to go home and manage her symptoms there.  Will plan on giving first dose of oral vancomycin here in the emergency department.  Discussed return precautions.   Nance Pear, MD 01/06/21 2026

## 2021-01-06 NOTE — Discharge Instructions (Signed)
Please seek medical attention for any high fevers, chest pain, shortness of breath, change in behavior, persistent vomiting, bloody stool or any other new or concerning symptoms.  

## 2021-01-06 NOTE — ED Provider Notes (Signed)
[facility] Emergency Department Provider Note  ____________________________________________   Event Date/Time   First MD Initiated Contact with Patient 01/06/21 1409     (approximate)  I have reviewed the triage vital signs and the nursing notes.   HISTORY  Chief Complaint Abdominal Pain and Diarrhea    HPI Vanessa Romero is a 45 y.o. female with history of cervical cancer, SBO, gastric bypass who comes in with abdominal pain.  It appears with notes from oncology that patient was C. difficile positive with no C. difficile toxin but they wanted to treat for an active infection.  Patient has not started the prescription yet.  Patient reports that she has had worsening abdominal pain over 5 days.  She states the pain is now more severe which is why her doctors wanted her to come in given she has a history of SBO.  Her pain is constant, not better with her home Dilaudid, nothing makes it worse.  She does report having multiple episodes of diarrhea.  She has been taking Zofran and oral Compazine at home to help with symptoms.  She gets radiation daily and that she gets chemotherapy last on Tuesday.  She had a full GI panel done that was negative by oncology other than the C. difficile being positive but no toxin.  She denies any falls, headaches, shortness of breath, chest pain.           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   Tylenol overdose 12/01/2020   Protein-calorie malnutrition, severe 11/26/2020   Cervical mass    Acute blood loss anemia    Vaginal bleeding    GI bleed 11/23/2020   Drop in hemoglobin    Seizure ([place])    Hypokalemia     Alcohol withdrawal ([place]) 11/22/2020   Tobacco dependence 11/22/2020   Depression 11/22/2020   LVH (left ventricular hypertrophy) 11/22/2020   Chronic diarrhea 11/22/2020   Hyponatremia 08/27/2020   RUQ abdominal pain 06/16/2020   Chronic anemia 05/18/2020   History of alcohol abuse 03/24/2020   Diarrhea 03/02/2020   Bilateral primary osteoarthritis of knee 01/06/2020   Anastomotic ulcer 12/27/2019   Partial bowel obstruction ([place]) 12/25/2019   Opioid dependence with withdrawal ([place]) 06/23/2019   Bilateral lower extremity edema 02/28/2018   Prolonged Q-T interval on ECG 02/28/2018   Difficult airway for intubation 06/28/2016   Intracranial hypertension 06/28/2016   Bipolar 2 disorder ([place]) 05/16/2016   Iron deficiency anemia following bariatric surgery 10/22/2015   IIH (idiopathic intracranial hypertension) 09/02/2015   Intractable chronic migraine without aura and without status migrainosus 09/02/2015   S/P gastric bypass 09/02/2015   H/O small bowel obstruction 06/27/2013   S/P exploratory laparotomy 06/04/2012   Benign essential hypertension 01/24/2012   Mild intermittent asthma without complication 77/82/4235   Presence of cerebrospinal fluid drainage device 01/24/2012   Tobacco use disorder 01/24/2012   Substance abuse ([place]) 05/26/2011   Knee pain 05/11/2011    Past Surgical History:  Procedure Laterality Date   ABDOMINAL ADHESION SURGERY     bowel obstruction     x2   CERVICAL CONIZATION W/BX N/A 11/26/2020  Procedure: CONIZATION CERVIX WITH BIOPSY;  Surgeon: Malachy Mood, MD;  Location: [facility] ORS;  Service: Gynecology;  Laterality: N/A;   ESOPHAGOGASTRODUODENOSCOPY N/A 11/24/2020   Procedure: ESOPHAGOGASTRODUODENOSCOPY (EGD);  Surgeon: Lin Landsman, MD;  Location: [facility] ENDOSCOPY;  Service: Gastroenterology;  Laterality: N/A;   laparoscopic knee surgery     PORTA CATH INSERTION N/A 12/20/2020   Procedure: PORTA CATH INSERTION;  Surgeon: Algernon Huxley, MD;   Location: [place] CV LAB;  Service: Cardiovascular;  Laterality: N/A;   ROUX-EN-Y GASTRIC BYPASS     TEAR DUCT PROBING     unclogg   VAGOTOMY     VENTRICULOPERITONEAL SHUNT     x6 put in and removals    Prior to Admission medications   Medication Sig Start Date End Date Taking? Authorizing Provider  cholecalciferol (VITAMIN D) 25 MCG tablet Take 1 tablet (1,000 Units total) by mouth daily. 11/27/20   Loletha Grayer, MD  cyanocobalamin 1000 MCG tablet Take 1 tablet (1,000 mcg total) by mouth daily. 01/04/21   Jacquelin Hawking, NP  escitalopram (LEXAPRO) 20 MG tablet Take 1 tablet (20 mg total) by mouth daily at 12 noon. 11/27/20   Loletha Grayer, MD  feeding supplement (ENSURE ENLIVE / ENSURE PLUS) LIQD Take 237 mLs by mouth 3 (three) times daily between meals. 11/27/20   Loletha Grayer, MD  folic acid (FOLVITE) 1 MG tablet Take 1 tablet (1 mg total) by mouth daily. 01/04/21   Jacquelin Hawking, NP  HYDROcodone-acetaminophen (NORCO/VICODIN) 5-325 MG tablet Take 1 tablet by mouth every 6 (six) hours as needed for moderate pain. 01/04/21   Jacquelin Hawking, NP  lamoTRIgine (LAMICTAL) 25 MG tablet Take 2 tablets (50 mg total) by mouth daily. 11/27/20   Loletha Grayer, MD  lidocaine-prilocaine (EMLA) cream Apply to affected area once 12/07/20   Lloyd Huger, MD  Multiple Vitamin (MULTIVITAMIN WITH MINERALS) TABS tablet Take 1 tablet by mouth daily. 11/27/20   Loletha Grayer, MD  ondansetron (ZOFRAN) 8 MG tablet Take 1 tablet (8 mg total) by mouth 2 (two) times daily as needed for refractory nausea / vomiting. 12/07/20   Lloyd Huger, MD  prochlorperazine (COMPAZINE) 10 MG tablet Take 1 tablet (10 mg total) by mouth every 6 (six) hours as needed (Nausea or vomiting). 12/07/20   Lloyd Huger, MD  sucralfate (CARAFATE) 1 g tablet Take 1 tablet (1 g total) by mouth 3 (three) times daily. 01/04/21   Noreene Filbert, MD  thiamine 100 MG tablet Take 1 tablet (100 mg  total) by mouth daily. 11/27/20   Loletha Grayer, MD  traMADol (ULTRAM) 50 MG tablet Take 1 tablet (50 mg total) by mouth every 6 (six) hours as needed. 01/04/21   Jacquelin Hawking, NP  vancomycin (VANCOCIN) 125 MG capsule Take 1 capsule (125 mg total) by mouth 4 (four) times daily. 01/05/21   Jacquelin Hawking, NP    Allergies Contrast media [iodinated diagnostic agents], Gabapentin, Morphine, Sumatriptan, Zolpidem, Erythromycin, Valproic acid, Acetazolamide, Erythromycin base, Amoxicillin, and Divalproex sodium  Family History  Problem Relation Age of Onset   Cancer Mother    Cancer Father    Cancer Maternal Grandmother     Social History Social History   Tobacco Use   Smoking status: Former    Types: Cigarettes   Smokeless tobacco: Former  Scientific laboratory technician Use: Former  Substance Use Topics   Alcohol use: Not Currently   Drug use: Not Currently  Review of Systems Constitutional: No fever/chills Eyes: No visual changes. ENT: No sore throat. Cardiovascular: Denies chest pain. Respiratory: Denies shortness of breath. Gastrointestinal: Positive abdominal pain, nausea, vomiting, diarrhea Genitourinary: Negative for dysuria. Musculoskeletal: Negative for back pain. Skin: Negative for rash. Neurological: Negative for headaches, focal weakness or numbness. All other ROS negative ____________________________________________   PHYSICAL EXAM:  VITAL SIGNS: ED Triage Vitals  Enc Vitals Group     BP 01/06/21 1307 121/72     Pulse Rate 01/06/21 1307 84     Resp 01/06/21 1307 16     Temp 01/06/21 1308 (!) 97.5 F (36.4 C)     Temp Source 01/06/21 1307 Oral     SpO2 01/06/21 1307 99 %     Weight 01/06/21 1308 120 lb (54.4 kg)     Height 01/06/21 1308 5\' 7"  (1.702 m)     Head Circumference --      Peak Flow --      Pain Score 01/06/21 1307 8     Pain Loc --      Pain Edu? --      Excl. in [place]? --     Constitutional: Alert and oriented. Well appearing and  in no acute distress. Eyes: Conjunctivae are normal. EOMI. Head: Atraumatic. Nose: No congestion/rhinnorhea. Mouth/Throat: Mucous membranes are moist.   Neck: No stridor. Trachea Midline. FROM Cardiovascular: Normal rate, regular rhythm. Grossly normal heart sounds.  Good peripheral circulation. Respiratory: Normal respiratory effort.  No retractions. Lungs CTAB. Gastrointestinal:   Old surgical scar, tender on examination Musculoskeletal: No lower extremity tenderness nor edema.  No joint effusions. Neurologic:  Normal speech and language. No gross focal neurologic deficits are appreciated.  Skin:  Skin is warm, dry and intact. No rash noted. Psychiatric: Mood and affect are normal. Speech and behavior are normal. GU: Deferred   ____________________________________________   LABS (all labs ordered are listed, but only abnormal results are displayed)  Labs Reviewed  COMPREHENSIVE METABOLIC PANEL - Abnormal; Notable for the following components:      Result Value   Sodium 134 (*)    CO2 21 (*)    Glucose, Bld 107 (*)    Calcium 8.2 (*)    Total Protein 5.8 (*)    Albumin 3.2 (*)    All other components within normal limits  CBC - Abnormal; Notable for the following components:   WBC 3.8 (*)    Hemoglobin 10.7 (*)    HCT 34.0 (*)    MCV 78.9 (*)    MCH 24.8 (*)    All other components within normal limits  LIPASE, BLOOD  URINALYSIS, ROUTINE W REFLEX MICROSCOPIC  POC URINE PREG, ED   ____________________________________________   ED ECG REPORT I, Vanessa Elmer, the attending physician, personally viewed and interpreted this ECG.  Normal sinus rate of 76 with a little bit of ST elevation in 2 3 aVF but no reciprocal changes, normal intervals ____________________________________________  RADIOLOGY Pending  PROCEDURES  Procedure(s) performed (including Critical Care):  Procedures   ____________________________________________   INITIAL IMPRESSION / ASSESSMENT  AND PLAN / ED COURSE  Mckinzey Paar was evaluated in Emergency Department on 01/06/2021 for the symptoms described in the history of present illness. She was evaluated in the context of the global COVID-19 pandemic, which necessitated consideration that the patient might be at risk for infection with the SARS-CoV-2 virus that causes COVID-19. Institutional protocols and algorithms that pertain to the evaluation of patients at risk for COVID-19 are in  a state of rapid change based on information released by regulatory bodies including the CDC and federal and state organizations. These policies and algorithms were followed during the patient's care in the ED.    Patient has a history of cervical cancer and comes in with severe abdominal pain, nausea, vomiting, diarrhea.  Patient was recently diagnosed with C. difficile but she was toxin negative but given the amount of stooling I think that it is good that she get started on treatment.  However will need CT imaging to rule out any evidence of perforation, obstruction, metastatic cervical cancer or other acute pathology.  Patient has contrast allergy.  Only hives.  We will do a contrast prep.  While while we are waiting for this we will get an x-ray just to make sure there is nothing that looks like it would need immediate surgery.  Her labs are reassuring slightly low sodium.  Her white count is low but similar to 2 days ago.  Patient getting some fluids, IV Dilaudid, IV Zofran.  Patient be handed off to oncoming team pending CT imaging.      ____________________________________________   FINAL CLINICAL IMPRESSION(S) / ED DIAGNOSES   Final diagnoses:  Abdominal pain, unspecified abdominal location  Diarrhea, unspecified type      MEDICATIONS GIVEN DURING THIS VISIT:  Medications  hydrocortisone sodium succinate (SOLU-CORTEF) injection 200 mg (has no administration in time range)  diphenhydrAMINE (BENADRYL) capsule 50 mg (has no  administration in time range)    Or  diphenhydrAMINE (BENADRYL) injection 50 mg (has no administration in time range)  sodium chloride 0.9 % bolus 1,000 mL (has no administration in time range)  ondansetron (ZOFRAN) injection 4 mg (has no administration in time range)  HYDROmorphone (DILAUDID) injection 0.5 mg (has no administration in time range)     ED Discharge Orders     None        Note:  This document was prepared using Dragon voice recognition software and may include unintentional dictation errors.    Vanessa [place], MD 01/06/21 [phone]

## 2021-01-07 ENCOUNTER — Telehealth: Payer: Self-pay | Admitting: *Deleted

## 2021-01-07 ENCOUNTER — Other Ambulatory Visit: Payer: Self-pay

## 2021-01-07 ENCOUNTER — Encounter: Payer: Self-pay | Admitting: Emergency Medicine

## 2021-01-07 ENCOUNTER — Ambulatory Visit: Payer: Medicare Other

## 2021-01-07 ENCOUNTER — Inpatient Hospital Stay
Admission: EM | Admit: 2021-01-07 | Discharge: 2021-01-14 | DRG: 372 | Disposition: A | Discharge status: Home / Self Care | Payer: Medicare Other | Attending: Internal Medicine | Admitting: Internal Medicine

## 2021-01-07 DIAGNOSIS — Z20822 Contact with and (suspected) exposure to covid-19: Secondary | ICD-10-CM | POA: Diagnosis present

## 2021-01-07 DIAGNOSIS — E871 Hypo-osmolality and hyponatremia: Secondary | ICD-10-CM | POA: Diagnosis present

## 2021-01-07 DIAGNOSIS — Z79899 Other long term (current) drug therapy: Secondary | ICD-10-CM | POA: Diagnosis not present

## 2021-01-07 DIAGNOSIS — R1084 Generalized abdominal pain: Secondary | ICD-10-CM | POA: Diagnosis not present

## 2021-01-07 DIAGNOSIS — Z881 Allergy status to other antibiotic agents status: Secondary | ICD-10-CM

## 2021-01-07 DIAGNOSIS — D701 Agranulocytosis secondary to cancer chemotherapy: Secondary | ICD-10-CM | POA: Diagnosis present

## 2021-01-07 DIAGNOSIS — E440 Moderate protein-calorie malnutrition: Secondary | ICD-10-CM | POA: Diagnosis present

## 2021-01-07 DIAGNOSIS — Z6822 Body mass index (BMI) 22.0-22.9, adult: Secondary | ICD-10-CM | POA: Diagnosis not present

## 2021-01-07 DIAGNOSIS — C539 Malignant neoplasm of cervix uteri, unspecified: Secondary | ICD-10-CM | POA: Diagnosis present

## 2021-01-07 DIAGNOSIS — D638 Anemia in other chronic diseases classified elsewhere: Secondary | ICD-10-CM | POA: Diagnosis not present

## 2021-01-07 DIAGNOSIS — Z8711 Personal history of peptic ulcer disease: Secondary | ICD-10-CM

## 2021-01-07 DIAGNOSIS — A09 Infectious gastroenteritis and colitis, unspecified: Secondary | ICD-10-CM | POA: Diagnosis present

## 2021-01-07 DIAGNOSIS — D630 Anemia in neoplastic disease: Secondary | ICD-10-CM | POA: Diagnosis present

## 2021-01-07 DIAGNOSIS — E162 Hypoglycemia, unspecified: Secondary | ICD-10-CM | POA: Diagnosis present

## 2021-01-07 DIAGNOSIS — Z9884 Bariatric surgery status: Secondary | ICD-10-CM

## 2021-01-07 DIAGNOSIS — Z888 Allergy status to other drugs, medicaments and biological substances status: Secondary | ICD-10-CM

## 2021-01-07 DIAGNOSIS — R197 Diarrhea, unspecified: Secondary | ICD-10-CM

## 2021-01-07 DIAGNOSIS — Z91041 Radiographic dye allergy status: Secondary | ICD-10-CM

## 2021-01-07 DIAGNOSIS — T451X5A Adverse effect of antineoplastic and immunosuppressive drugs, initial encounter: Secondary | ICD-10-CM

## 2021-01-07 DIAGNOSIS — F419 Anxiety disorder, unspecified: Secondary | ICD-10-CM | POA: Diagnosis present

## 2021-01-07 DIAGNOSIS — A0472 Enterocolitis due to Clostridium difficile, not specified as recurrent: Secondary | ICD-10-CM | POA: Diagnosis present

## 2021-01-07 DIAGNOSIS — R109 Unspecified abdominal pain: Secondary | ICD-10-CM

## 2021-01-07 DIAGNOSIS — Z87891 Personal history of nicotine dependence: Secondary | ICD-10-CM

## 2021-01-07 DIAGNOSIS — Z880 Allergy status to penicillin: Secondary | ICD-10-CM

## 2021-01-07 DIAGNOSIS — K520 Gastroenteritis and colitis due to radiation: Secondary | ICD-10-CM | POA: Diagnosis present

## 2021-01-07 DIAGNOSIS — Z885 Allergy status to narcotic agent status: Secondary | ICD-10-CM | POA: Diagnosis not present

## 2021-01-07 DIAGNOSIS — R112 Nausea with vomiting, unspecified: Secondary | ICD-10-CM

## 2021-01-07 LAB — CBC
HCT: 35.7 % — ABNORMAL LOW (ref 36.0–46.0)
Hemoglobin: 11.1 g/dL — ABNORMAL LOW (ref 12.0–15.0)
MCH: 25.2 pg — ABNORMAL LOW (ref 26.0–34.0)
MCHC: 31.1 g/dL (ref 30.0–36.0)
MCV: 81 fL (ref 80.0–100.0)
Platelets: 260 10*3/uL (ref 150–400)
RBC: 4.41 MIL/uL (ref 3.87–5.11)
WBC: 3.3 10*3/uL — ABNORMAL LOW (ref 4.0–10.5)
nRBC: 0 % (ref 0.0–0.2)

## 2021-01-07 LAB — COMPREHENSIVE METABOLIC PANEL
ALT: 15 U/L (ref 0–44)
AST: 20 U/L (ref 15–41)
Albumin: 3.3 g/dL — ABNORMAL LOW (ref 3.5–5.0)
Alkaline Phosphatase: 41 U/L (ref 38–126)
Anion gap: 6 (ref 5–15)
BUN: 14 mg/dL (ref 6–20)
CO2: 25 mmol/L (ref 22–32)
Calcium: 8.9 mg/dL (ref 8.9–10.3)
Chloride: 108 mmol/L (ref 98–111)
Creatinine, Ser: 0.69 mg/dL (ref 0.44–1.00)
GFR, Estimated: >60 mL/min (ref >60)
Glucose, Bld: 63 mg/dL — ABNORMAL LOW (ref 70–99)
Potassium: 4 mmol/L (ref 3.5–5.1)
Sodium: 139 mmol/L (ref 135–145)
Total Bilirubin: 0.4 mg/dL (ref 0.3–1.2)
Total Protein: 5.9 g/dL — ABNORMAL LOW (ref 6.5–8.1)

## 2021-01-07 LAB — URINALYSIS, ROUTINE W REFLEX MICROSCOPIC
Bilirubin Urine: NEGATIVE
Glucose, UA: NEGATIVE mg/dL
Ketones, ur: NEGATIVE mg/dL
Leukocytes,Ua: NEGATIVE
Nitrite: NEGATIVE
Protein, ur: NEGATIVE mg/dL
Specific Gravity, Urine: 1.02 (ref 1.005–1.030)
pH: 6.5 (ref 5.0–8.0)

## 2021-01-07 LAB — TROPONIN I (HIGH SENSITIVITY)
Troponin I (High Sensitivity): <2 ng/L (ref <18)
Troponin I (High Sensitivity): <2 ng/L (ref <18)

## 2021-01-07 LAB — LIPASE, BLOOD: Lipase: 27 U/L (ref 11–51)

## 2021-01-07 LAB — POC URINE PREG, ED: Preg Test, Ur: NEGATIVE

## 2021-01-07 MED ORDER — SODIUM CHLORIDE 0.9 % IV SOLN: INTRAVENOUS | Status: DC

## 2021-01-07 MED ORDER — HYDROMORPHONE HCL 1 MG/ML IJ SOLN
1.0000 mg | Freq: Once | INTRAMUSCULAR | Status: AC
  Administered 2021-01-07: 19:00:00 1 mg via INTRAVENOUS
  Filled 2021-01-07: qty 1

## 2021-01-07 MED ORDER — VANCOMYCIN HCL 125 MG PO CAPS
125.0000 mg | ORAL_CAPSULE | Freq: Four times a day (QID) | ORAL | Status: DC
  Administered 2021-01-07 – 2021-01-09 (×7): 125 mg via ORAL
  Filled 2021-01-07 (×11): qty 1

## 2021-01-07 MED ORDER — KETOROLAC TROMETHAMINE 30 MG/ML IJ SOLN
30.0000 mg | Freq: Once | INTRAMUSCULAR | Status: AC
  Administered 2021-01-07: 19:00:00 30 mg via INTRAVENOUS
  Filled 2021-01-07: qty 1

## 2021-01-07 MED ORDER — HYDROMORPHONE HCL 1 MG/ML IJ SOLN
0.5000 mg | Freq: Once | INTRAMUSCULAR | Status: AC
  Administered 2021-01-07: 22:00:00 0.5 mg via INTRAVENOUS
  Filled 2021-01-07: qty 1

## 2021-01-07 MED ORDER — SODIUM CHLORIDE 0.9 % IV SOLN: Freq: Once | INTRAVENOUS | Status: AC

## 2021-01-07 MED ORDER — PANTOPRAZOLE SODIUM 40 MG IV SOLR
40.0000 mg | INTRAVENOUS | Status: DC
  Administered 2021-01-07 – 2021-01-13 (×7): 40 mg via INTRAVENOUS
  Filled 2021-01-07 (×7): qty 40

## 2021-01-07 MED ORDER — PROCHLORPERAZINE EDISYLATE 10 MG/2ML IJ SOLN
10.0000 mg | Freq: Four times a day (QID) | INTRAMUSCULAR | Status: DC | PRN
  Administered 2021-01-07 – 2021-01-10 (×10): 10 mg via INTRAVENOUS
  Filled 2021-01-07 (×10): qty 2

## 2021-01-07 MED ORDER — HYDROMORPHONE HCL 1 MG/ML IJ SOLN
1.0000 mg | INTRAMUSCULAR | Status: DC | PRN
  Administered 2021-01-07 – 2021-01-09 (×14): 1 mg via INTRAVENOUS
  Filled 2021-01-07 (×15): qty 1

## 2021-01-07 MED ORDER — SODIUM CHLORIDE 0.9 % IV BOLUS
1000.0000 mL | Freq: Once | INTRAVENOUS | Status: AC
  Administered 2021-01-07: 19:00:00 1000 mL via INTRAVENOUS

## 2021-01-07 MED ORDER — KETOROLAC TROMETHAMINE 30 MG/ML IJ SOLN: 30.0000 mg | Freq: Once | INTRAMUSCULAR | Status: DC

## 2021-01-07 MED ORDER — ENOXAPARIN SODIUM 40 MG/0.4ML IJ SOSY
40.0000 mg | PREFILLED_SYRINGE | INTRAMUSCULAR | Status: DC
  Administered 2021-01-07 – 2021-01-13 (×7): 40 mg via SUBCUTANEOUS
  Filled 2021-01-07 (×7): qty 0.4

## 2021-01-07 NOTE — ED Notes (Signed)
Pt endorsing pain, md messaged

## 2021-01-07 NOTE — ED Triage Notes (Signed)
Pt comes into the ED via POV c/o abd pain and being c-diff positive.  Pt was seen last night where she was offered admission, but she decided to try and go home.  Pt presents back with continual pain and states that she has been unable to manage at home.  Pt in NAD at this time with even and unlabored respirations.  Pt currently undergoing chemo and radiation for cervical cancer.

## 2021-01-07 NOTE — ED Provider Notes (Signed)
[facility] Emergency Department Provider Note ____________________________________________   Event Date/Time   First MD Initiated Contact with Patient 01/07/21 1745     (approximate)  I have reviewed the triage vital signs and the nursing notes.   HISTORY  Chief Complaint Abdominal Pain   HPI Vanessa Romero is a 45 y.o. female who presents for abdominal pain and diarrhea  LOCATION: Generalized abdomen DURATION: 1 week prior to arrival TIMING: Worsening since onset SEVERITY: Severe QUALITY: Sharp stabbing pain CONTEXT: Patient states she began having abdominal pain with associated diarrhea approximately 1 week prior to arrival.  3 days prior to arrival patient has positive for C. difficile and yesterday was seen and diagnosed on CT scan with colitis.  Patient was given the opportunity to go home and manage symptoms on her own with antibiotics however she is having continued pain, worsening diarrhea, and subjective fevers and therefore returns to our emergency department MODIFYING FACTORS: Any p.o. intake worsens her pain and diarrhea.  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   Tylenol overdose 12/01/2020   Protein-calorie malnutrition, severe 11/26/2020   Cervical mass    Acute blood loss anemia    Vaginal bleeding    GI bleed 11/23/2020   Drop in hemoglobin    Seizure ([place])    Hypokalemia    Alcohol withdrawal ([place]) 11/22/2020    Tobacco dependence 11/22/2020   Depression 11/22/2020   LVH (left ventricular hypertrophy) 11/22/2020   Chronic diarrhea 11/22/2020   Hyponatremia 08/27/2020   RUQ abdominal pain 06/16/2020   Chronic anemia 05/18/2020   History of alcohol abuse 03/24/2020   Diarrhea 03/02/2020   Bilateral primary osteoarthritis of knee 01/06/2020   Anastomotic ulcer 12/27/2019   Partial bowel obstruction ([place]) 12/25/2019   Opioid dependence with withdrawal ([place]) 06/23/2019   Bilateral lower extremity edema 02/28/2018   Prolonged Q-T interval on ECG 02/28/2018   Difficult airway for intubation 06/28/2016   Intracranial hypertension 06/28/2016   Bipolar 2 disorder ([place]) 05/16/2016   Iron deficiency anemia following bariatric surgery 10/22/2015   IIH (idiopathic intracranial hypertension) 09/02/2015   Intractable chronic migraine without aura and without status migrainosus 09/02/2015   S/P gastric bypass 09/02/2015   H/O small bowel obstruction 06/27/2013   S/P exploratory laparotomy 06/04/2012   Benign essential hypertension 01/24/2012   Mild intermittent asthma without complication 07/62/2633   Presence of cerebrospinal fluid drainage device 01/24/2012   Tobacco use disorder 01/24/2012   Substance abuse ([place]) 05/26/2011   Knee pain 05/11/2011    Past Surgical History:  Procedure Laterality Date   ABDOMINAL ADHESION SURGERY     bowel obstruction     x2   CERVICAL CONIZATION W/BX N/A 11/26/2020   Procedure: CONIZATION CERVIX WITH BIOPSY;  Surgeon: Malachy Mood, MD;  Location: [facility] ORS;  Service: Gynecology;  Laterality: N/A;   ESOPHAGOGASTRODUODENOSCOPY N/A 11/24/2020   Procedure: ESOPHAGOGASTRODUODENOSCOPY (EGD);  Surgeon: Lin Landsman, MD;  Location: [facility] ENDOSCOPY;  Service: Gastroenterology;  Laterality: N/A;   laparoscopic knee surgery     PORTA CATH INSERTION N/A 12/20/2020   Procedure: PORTA CATH INSERTION;  Surgeon: Algernon Huxley, MD;  Location: [facility];   Service: Cardiovascular;  Laterality: N/A;   ROUX-EN-Y GASTRIC BYPASS     TEAR DUCT PROBING     unclogg   VAGOTOMY     VENTRICULOPERITONEAL SHUNT     x6 put in and removals    Prior to Admission medications   Medication Sig Start Date End Date Taking? Authorizing Provider  cholecalciferol (VITAMIN D) 25 MCG tablet Take 1 tablet (1,000 Units total) by mouth daily. 11/27/20   Loletha Grayer, MD  cyanocobalamin 1000 MCG tablet Take 1 tablet (1,000 mcg total) by mouth daily. 01/04/21   Jacquelin Hawking, NP  dicyclomine (BENTYL) 10 MG capsule Take 1 capsule (10 mg total) by mouth 3 (three) times daily as needed (abd pain). 01/06/21   Nance Pear, MD  escitalopram (LEXAPRO) 20 MG tablet Take 1 tablet (20 mg total) by mouth daily at 12 noon. 11/27/20   Loletha Grayer, MD  feeding supplement (ENSURE ENLIVE / ENSURE PLUS) LIQD Take 237 mLs by mouth 3 (three) times daily between meals. 11/27/20   Loletha Grayer, MD  folic acid (FOLVITE) 1 MG tablet Take 1 tablet (1 mg total) by mouth daily. 01/04/21   Jacquelin Hawking, NP  HYDROcodone-acetaminophen (NORCO/VICODIN) 5-325 MG tablet Take 1 tablet by mouth every 6 (six) hours as needed for moderate pain. 01/04/21   Jacquelin Hawking, NP  lamoTRIgine (LAMICTAL) 25 MG tablet Take 2 tablets (50 mg total) by mouth daily. 11/27/20   Loletha Grayer, MD  lidocaine-prilocaine (EMLA) cream Apply to affected area once 12/07/20   Lloyd Huger, MD  Multiple Vitamin (MULTIVITAMIN WITH MINERALS) TABS tablet Take 1 tablet by mouth daily. 11/27/20   Loletha Grayer, MD  ondansetron (ZOFRAN) 8 MG tablet Take 1 tablet (8 mg total) by mouth 2 (two) times daily as needed for refractory nausea / vomiting. 12/07/20   Lloyd Huger, MD  prochlorperazine (COMPAZINE) 10 MG tablet Take 1 tablet (10 mg total) by mouth every 6 (six) hours as needed (Nausea or vomiting). 12/07/20   Lloyd Huger, MD  sucralfate (CARAFATE) 1 g tablet Take 1 tablet (1 g  total) by mouth 3 (three) times daily. 01/04/21   Noreene Filbert, MD  thiamine 100 MG tablet Take 1 tablet (100 mg total) by mouth daily. 11/27/20   Loletha Grayer, MD  traMADol (ULTRAM) 50 MG tablet Take 1 tablet (50 mg total) by mouth every 6 (six) hours as needed. 01/04/21   Jacquelin Hawking, NP  vancomycin (VANCOCIN) 125 MG capsule Take 1 capsule (125 mg total) by mouth 4 (four) times daily. 01/05/21   Jacquelin Hawking, NP    Allergies Contrast media [iodinated contrast media], Gabapentin, Morphine, Sumatriptan, Zolpidem, Erythromycin, Valproic acid, Acetazolamide, Erythromycin base, Amoxicillin, and Divalproex sodium  Family History  Problem Relation Age of Onset   Cancer Mother    Cancer Father    Cancer Maternal Grandmother     Social History Social History   Tobacco Use   Smoking status: Former    Types: Cigarettes   Smokeless tobacco: Former  Scientific laboratory technician Use: Former  Substance Use Topics   Alcohol use: Not Currently   Drug use: Not Currently    Review of Systems Constitutional: No fever/chills Eyes: No visual changes. ENT:  No sore throat. Cardiovascular: Denies chest pain. Respiratory: Denies shortness of breath. Gastrointestinal: Endorses generalized abdominal pain.  No nausea, no vomiting.  Endorses diarrhea. Genitourinary: Negative for dysuria. Musculoskeletal: Negative for acute arthralgias Skin: Negative for rash. Neurological: Negative for headaches, weakness/numbness/paresthesias in any extremity Psychiatric: Negative for suicidal ideation/homicidal ideation   ____________________________________________   PHYSICAL EXAM:  VITAL SIGNS: ED Triage Vitals  Enc Vitals Group     BP 01/07/21 1237 103/79     Pulse Rate 01/07/21 1237 87     Resp 01/07/21 1237 19     Temp 01/07/21 1237 98.2 F (36.8 C)     Temp Source 01/07/21 1237 Oral     SpO2 01/07/21 1237 100 %     Weight 01/07/21 1238 120 lb (54.4 kg)     Height 01/07/21 1238 5\' 7"   (1.702 m)     Head Circumference --      Peak Flow --      Pain Score 01/07/21 1238 8     Pain Loc --      Pain Edu? --      Excl. in [place]? --    Constitutional: Alert and oriented. Well appearing and in no acute distress. Eyes: Conjunctivae are normal. PERRL. Head: Atraumatic. Nose: No congestion/rhinnorhea. Mouth/Throat: Mucous membranes are moist. Neck: No stridor Cardiovascular: Grossly normal heart sounds.  Good peripheral circulation. Respiratory: Normal respiratory effort.  No retractions. Gastrointestinal: Soft and generalized tenderness to palpation. No distention. Musculoskeletal: No obvious deformities Neurologic:  Normal speech and language. No gross focal neurologic deficits are appreciated. Skin:  Skin is warm and dry. No rash noted. Psychiatric: Mood and affect are normal. Speech and behavior are normal.  ____________________________________________   LABS (all labs ordered are listed, but only abnormal results are displayed)  Labs Reviewed  COMPREHENSIVE METABOLIC PANEL - Abnormal; Notable for the following components:      Result Value   Glucose, Bld 63 (*)    Total Protein 5.9 (*)    Albumin 3.3 (*)    All other components within normal limits  CBC - Abnormal; Notable for the following components:   WBC 3.3 (*)    Hemoglobin 11.1 (*)    HCT 35.7 (*)    MCH 25.2 (*)    All other components within normal limits  LIPASE, BLOOD  URINALYSIS, ROUTINE W REFLEX MICROSCOPIC  POC URINE PREG, ED  TROPONIN I (HIGH SENSITIVITY)   ____________________________________________  RADIOLOGY  ED MD interpretation: CT abdomen pelvis with contrast from 01/06/2021 shows mild to moderate severity infectious versus inflammatory colitis involving the descending and sigmoid colon consistent with C. difficile colitis  Official radiology report(s): CT ABDOMEN PELVIS W CONTRAST  Result Date: 01/06/2021 CLINICAL DATA:  Abdominal cramping and diarrhea. EXAM: CT ABDOMEN AND  PELVIS WITH CONTRAST TECHNIQUE: Multidetector CT imaging of the abdomen and pelvis was performed using the standard protocol following bolus administration of intravenous contrast. CONTRAST:  92mL OMNIPAQUE IOHEXOL 300 MG/ML  SOLN COMPARISON:  None. FINDINGS: Lower chest: No acute abnormality. Hepatobiliary: No focal liver abnormality is seen. Status post cholecystectomy. Common bile duct is dilated and measures 1.4 cm. Pancreas: Unremarkable. No pancreatic ductal dilatation or surrounding inflammatory changes. Spleen: Normal in size without focal abnormality. Adrenals/Urinary Tract: Adrenal glands are unremarkable. Kidneys are normal, without renal calculi, focal lesion, or hydronephrosis. The urinary bladder is partially empty and subsequently limited in evaluation. Stomach/Bowel: Surgical sutures are seen within the gastric region. Appendix appears normal. No evidence of bowel dilatation. Mild to  moderate severity diffuse colonic wall thickening is seen throughout the descending and sigmoid colon. Vascular/Lymphatic: Very mild aortic atherosclerosis. No enlarged abdominal or pelvic lymph nodes. Reproductive: The uterus is unremarkable. A 2.1 cm diameter cyst is seen along the posterior aspect of the right adnexa. Other: No abdominal wall hernia or abnormality. No abdominopelvic ascites. Musculoskeletal: No acute or significant osseous findings. IMPRESSION: 1. Mild to moderate severity infectious or inflammatory colitis involving the descending and sigmoid colon. 2. Evidence of prior cholecystectomy. 3. 2.1 cm diameter right adnexal cyst, likely ovarian in origin. 4. Very mild aortic atherosclerosis. Aortic Atherosclerosis (ICD10-I70.0). Electronically Signed   By: Virgina Norfolk M.D.   On: 01/06/2021 19:52    ____________________________________________   PROCEDURES  Procedure(s) performed (including Critical Care):  .1-3 Lead EKG Interpretation Performed by: Naaman Plummer, MD Authorized by:  Naaman Plummer, MD     Interpretation: normal     ECG rate:  68   ECG rate assessment: normal     Rhythm: sinus rhythm     Ectopy: none     Conduction: normal     ____________________________________________   INITIAL IMPRESSION / ASSESSMENT AND PLAN / ED COURSE  As part of my medical decision making, I reviewed the following data within the electronic medical record, if available:  Nursing notes reviewed and incorporated, Labs reviewed, EKG interpreted, Old chart reviewed, Radiograph reviewed and Notes from prior ED visits reviewed and incorporated        Patient a 45 year old female who presents for abdominal pain with diarrhea in the setting of positive C. difficile and findings on CT abdomen pelvis yesterday of colitis.  Patient endorses worsening pain after attempting to control her symptoms at home with antibiotics.  Patient's pain was unable to be controlled at home and she presents today for admission that was offered yesterday. DDx: Perforated colitis, C. difficile colitis, sepsis WBC 3.3 Last chemotherapeutic infusion on 01/04/2021 Last radiation treatment on 01/05/2021 Patient currently taking vancomycin 125 mg 4 times a day and has taken both morning treatments.  Given failure of outpatient treatment and pain control for her C. difficile colitis, patient will require admission to the internal medicine service for further evaluation and management.  Dispo: Admit to medicine     ____________________________________________   FINAL CLINICAL IMPRESSION(S) / ED DIAGNOSES  Final diagnoses:  Clostridium difficile colitis  Generalized abdominal pain  Diarrhea of infectious origin     ED Discharge Orders     None        Note:  This document was prepared using Dragon voice recognition software and may include unintentional dictation errors.    Naaman Plummer, MD 01/07/21 [phone]

## 2021-01-07 NOTE — H&P (Signed)
History and Physical  Vanessa Romero EYC:[id number] DOB: 12-24-1975 DOA: 01/07/2021  Referring physician: Naaman Plummer, MD PCP: Anselmo Pickler, MD  Patient coming from: Home  Chief Complaint: Abdominal pain  HPI: Vanessa Romero is a 45 y.o. female with medical history significant for cervical carcinoma currently undergoing chemotherapy and radiotherapy, gastric bypass, intracranial hypertension who presents to the emergency department due to about 5-day onset of abdominal pain, nausea, vomiting and diarrhea.  Patient tested positive for C. difficile with no C. difficile toxin per patient's oncologist medical record, but they wanted patient to be treated for an active infection.  Abdominal pain was sharp and burning and was rated as 7-8/10 on pain scale, it was intermittent and there was no alleviating/aggravating factor.  Patient states that she has not been able to tolerate any oral intake due to vomiting.  She denies fever, chest pain, shortness of breath, headache. She presented to the ED yesterday and she was going to be admitted, however, she preferred to take her medications at home, unfortunately, the abdominal pain was too intense and also due to the recurrent nausea vomiting.  She returned today for further evaluation and management.  ED Course:  In the emergency department, she was hemodynamically stable.  Work-up in the ED showed leukopenia, normocytic anemia, hypoglycemia, troponin x1 was negative, urinalysis was negative.  Influenza A, B, SARS coronavirus 2 was negative.  Full GI panel done by oncology was negative except for C. difficile without the C. difficile toxin CT abdomen and pelvis with contrast showed mild to moderate severity infectious or inflammatory colitis involving the descending and sigmoid colon Patient was started on vancomycin, Toradol and Dilaudid were given.  An IV hydration was provided.  Hospitalist was asked to admit patient for further  evaluation and management.  Review of Systems: A full 10 point Review of Systems was done, except as stated above, all other Review of systems were negative.   Past Medical History:  Diagnosis Date   Alcohol abuse    Anemia    Cancer ([place])    Tobacco dependence    Past Surgical History:  Procedure Laterality Date   ABDOMINAL ADHESION SURGERY     bowel obstruction     x2   CERVICAL CONIZATION W/BX N/A 11/26/2020   Procedure: CONIZATION CERVIX WITH BIOPSY;  Surgeon: Malachy Mood, MD;  Location: [facility] ORS;  Service: Gynecology;  Laterality: N/A;   ESOPHAGOGASTRODUODENOSCOPY N/A 11/24/2020   Procedure: ESOPHAGOGASTRODUODENOSCOPY (EGD);  Surgeon: Lin Landsman, MD;  Location: [facility] ENDOSCOPY;  Service: Gastroenterology;  Laterality: N/A;   laparoscopic knee surgery     PORTA CATH INSERTION N/A 12/20/2020   Procedure: PORTA CATH INSERTION;  Surgeon: Algernon Huxley, MD;  Location: [place] [facility];  Service: Cardiovascular;  Laterality: N/A;   ROUX-EN-Y GASTRIC BYPASS     TEAR DUCT PROBING     unclogg   VAGOTOMY     VENTRICULOPERITONEAL SHUNT     x6 put in and removals    Social History:  reports that she has quit smoking. Her smoking use included cigarettes. She has quit using smokeless tobacco. She reports that she does not currently use alcohol. She reports that she does not currently use drugs.   Allergies  Allergen Reactions   Contrast Media [Iodinated Contrast Media] Hives   Gabapentin Other (See Comments), Rash and Palpitations    Other Reaction: tachycardia Other Reaction: tachycardia    Morphine Dermatitis, Hives, Rash, Swelling and Other (See Comments)    Other  reaction(s): Unknown (comments) Has tolerated hydromorphone (Dilaudid) Immediate after injections arm edema and arm turned bright red Immediate after injections arm edema and arm turned bright red IV Morphine IV Morphine    Sumatriptan Dermatitis, Hives, Itching, Other (See Comments) and Swelling     Other reaction(s): Joint Pain, Other (See Comments), Other (see comments), Unknown (comments) lock jaw Lock jaw Lock jaw Lock jaw TIGHTENING OF JAW Lock jaw lock jaw Lock jaw TIGHTENING OF JAW Lock jaw    Zolpidem Nausea And Vomiting and Other (See Comments)    Other reaction(s): Other (see comments) sleep walking sleep walking Sleep walking  don't tolerate it well    Erythromycin Diarrhea, Nausea And Vomiting and Nausea Only    Extreme upset stomach    Valproic Acid Rash    Other reaction(s): Other (see comments), Unknown MOOD DISORDER MOOD DISORDER Depakote: Reaction unknown     Acetazolamide     Other reaction(s): Unknown (comments)   Erythromycin Base     Other reaction(s): UNKNOWN   Amoxicillin Rash   Divalproex Sodium Anxiety and Other (See Comments)    Family History  Problem Relation Age of Onset   Cancer Mother    Cancer Father    Cancer Maternal Grandmother      Prior to Admission medications   Medication Sig Start Date End Date Taking? Authorizing Provider  cholecalciferol (VITAMIN D) 25 MCG tablet Take 1 tablet (1,000 Units total) by mouth daily. 11/27/20   Loletha Grayer, MD  cyanocobalamin 1000 MCG tablet Take 1 tablet (1,000 mcg total) by mouth daily. 01/04/21   Jacquelin Hawking, NP  dicyclomine (BENTYL) 10 MG capsule Take 1 capsule (10 mg total) by mouth 3 (three) times daily as needed (abd pain). 01/06/21   Nance Pear, MD  escitalopram (LEXAPRO) 20 MG tablet Take 1 tablet (20 mg total) by mouth daily at 12 noon. 11/27/20   Loletha Grayer, MD  feeding supplement (ENSURE ENLIVE / ENSURE PLUS) LIQD Take 237 mLs by mouth 3 (three) times daily between meals. 11/27/20   Loletha Grayer, MD  folic acid (FOLVITE) 1 MG tablet Take 1 tablet (1 mg total) by mouth daily. 01/04/21   Jacquelin Hawking, NP  HYDROcodone-acetaminophen (NORCO/VICODIN) 5-325 MG tablet Take 1 tablet by mouth every 6 (six) hours as needed for moderate pain. 01/04/21    Jacquelin Hawking, NP  lamoTRIgine (LAMICTAL) 25 MG tablet Take 2 tablets (50 mg total) by mouth daily. 11/27/20   Loletha Grayer, MD  lidocaine-prilocaine (EMLA) cream Apply to affected area once 12/07/20   Lloyd Huger, MD  Multiple Vitamin (MULTIVITAMIN WITH MINERALS) TABS tablet Take 1 tablet by mouth daily. 11/27/20   Loletha Grayer, MD  ondansetron (ZOFRAN) 8 MG tablet Take 1 tablet (8 mg total) by mouth 2 (two) times daily as needed for refractory nausea / vomiting. 12/07/20   Lloyd Huger, MD  prochlorperazine (COMPAZINE) 10 MG tablet Take 1 tablet (10 mg total) by mouth every 6 (six) hours as needed (Nausea or vomiting). 12/07/20   Lloyd Huger, MD  sucralfate (CARAFATE) 1 g tablet Take 1 tablet (1 g total) by mouth 3 (three) times daily. 01/04/21   Noreene Filbert, MD  thiamine 100 MG tablet Take 1 tablet (100 mg total) by mouth daily. 11/27/20   Loletha Grayer, MD  traMADol (ULTRAM) 50 MG tablet Take 1 tablet (50 mg total) by mouth every 6 (six) hours as needed. 01/04/21   Jacquelin Hawking, NP  vancomycin (VANCOCIN) 125 MG  capsule Take 1 capsule (125 mg total) by mouth 4 (four) times daily. 01/05/21   Jacquelin Hawking, NP    Physical Exam: BP 121/82    Pulse 71    Temp 98.2 F (36.8 C) (Oral)    Resp 13    Ht 5\' 7"  (1.702 m)    Wt 54.4 kg    LMP 01/04/2021    SpO2 100%    BMI 18.79 kg/m   General: 45 y.o. year-old female well developed well nourished in no acute distress.  Alert and oriented x3. HEENT: NCAT, EOMI Neck: Supple, trachea medial Cardiovascular: Regular rate and rhythm with no rubs or gallops.  No thyromegaly or JVD noted.  No lower extremity edema. 2/4 pulses in all 4 extremities. Respiratory: Clear to auscultation with no wheezes or rales. Good inspiratory effort. Abdomen: Soft, nontender nondistended with normal bowel sounds x4 quadrants. Muskuloskeletal: No cyanosis, clubbing or edema noted bilaterally Neuro: CN II-XII intact, strength  5/5 x 4, sensation, reflexes intact Skin: No ulcerative lesions noted or rashes Psychiatry: Judgement and insight appear normal. Mood is appropriate for condition and setting          Labs on Admission:  Basic Metabolic Panel: Recent Labs  Lab 01/04/21 0939 01/06/21 1323 01/07/21 1240  NA 137 134* 139  K 3.8 4.1 4.0  CL 106 108 108  CO2 23 21* 25  GLUCOSE 89 107* 63*  BUN 18 13 14   CREATININE 0.56 0.61 0.69  CALCIUM 8.0* 8.2* 8.9   Liver Function Tests: Recent Labs  Lab 01/04/21 0939 01/06/21 1323 01/07/21 1240  AST 20 26 20   ALT 13 17 15   ALKPHOS 40 38 41  BILITOT 0.2* 0.4 0.4  PROT 5.8* 5.8* 5.9*  ALBUMIN 3.2* 3.2* 3.3*   Recent Labs  Lab 01/06/21 1323 01/07/21 1240  LIPASE 25 27   No results for input(s): AMMONIA in the last 168 hours. CBC: Recent Labs  Lab 01/04/21 0939 01/06/21 1323 01/07/21 1240  WBC 3.5* 3.8* 3.3*  NEUTROABS 2.2  --   --   HGB 10.4* 10.7* 11.1*  HCT 33.3* 34.0* 35.7*  MCV 79.9* 78.9* 81.0  PLT 245 243 260   Cardiac Enzymes: No results for input(s): CKTOTAL, CKMB, CKMBINDEX, TROPONINI in the last 168 hours.  BNP (last 3 results) No results for input(s): BNP in the last 8760 hours.  ProBNP (last 3 results) No results for input(s): PROBNP in the last 8760 hours.  CBG: No results for input(s): GLUCAP in the last 168 hours.  Radiological Exams on Admission: CT ABDOMEN PELVIS W CONTRAST  Result Date: 01/06/2021 CLINICAL DATA:  Abdominal cramping and diarrhea. EXAM: CT ABDOMEN AND PELVIS WITH CONTRAST TECHNIQUE: Multidetector CT imaging of the abdomen and pelvis was performed using the standard protocol following bolus administration of intravenous contrast. CONTRAST:  18mL OMNIPAQUE IOHEXOL 300 MG/ML  SOLN COMPARISON:  None. FINDINGS: Lower chest: No acute abnormality. Hepatobiliary: No focal liver abnormality is seen. Status post cholecystectomy. Common bile duct is dilated and measures 1.4 cm. Pancreas: Unremarkable. No  pancreatic ductal dilatation or surrounding inflammatory changes. Spleen: Normal in size without focal abnormality. Adrenals/Urinary Tract: Adrenal glands are unremarkable. Kidneys are normal, without renal calculi, focal lesion, or hydronephrosis. The urinary bladder is partially empty and subsequently limited in evaluation. Stomach/Bowel: Surgical sutures are seen within the gastric region. Appendix appears normal. No evidence of bowel dilatation. Mild to moderate severity diffuse colonic wall thickening is seen throughout the descending and sigmoid colon. Vascular/Lymphatic: Very mild  aortic atherosclerosis. No enlarged abdominal or pelvic lymph nodes. Reproductive: The uterus is unremarkable. A 2.1 cm diameter cyst is seen along the posterior aspect of the right adnexa. Other: No abdominal wall hernia or abnormality. No abdominopelvic ascites. Musculoskeletal: No acute or significant osseous findings. IMPRESSION: 1. Mild to moderate severity infectious or inflammatory colitis involving the descending and sigmoid colon. 2. Evidence of prior cholecystectomy. 3. 2.1 cm diameter right adnexal cyst, likely ovarian in origin. 4. Very mild aortic atherosclerosis. Aortic Atherosclerosis (ICD10-I70.0). Electronically Signed   By: Virgina Norfolk M.D.   On: 01/06/2021 19:52    EKG: I independently viewed the EKG done and my findings are as followed: Normal sinus rhythm at a rate of 71 bpm with RBBB  Assessment/Plan Present on Admission:  Colitis due to Clostridioides difficile  Cervical cancer, FIGO stage IIB (HCC)  Principal Problem:   Colitis due to Clostridioides difficile Active Problems:   Cervical cancer, FIGO stage IIB (HCC)   Leukopenia   Abdominal pain   Nausea & vomiting  Abdominal pain, nausea and vomiting secondary to colitis due to clostridioides  difficile Continue vancomycin Continue IV Compazine 10 mg every 6 hours as needed for nausea and vomiting Continue IV Dilaudid as  needed Continue IV Protonix Continue IV hydration Continue clear liquid diet and advance diet as tolerated  Hypoglycemia Continue CBG every 4 hours as needed and treat accordingly  Leukopenia possibly due to antineoplastic chemotherapy WBC 3.3, continue to monitor with morning labs  Cervical cancer Patient follows with oncologist She is on carboplatin/paclitaxel weekly for 6 weeks  DVT prophylaxis: Lovenox  Code Status: Full code  Family Communication: None at bedside  Disposition Plan:  Patient is from:                        home Anticipated DC to:                   SNF or family members home Anticipated DC date:               2-3 days Anticipated DC barriers:          Patient requires inpatient management due to nausea, vomiting and abdominal pain with difficulty in being able to tolerate oral intake due to recurrent vomiting   Consults called: None  Admission status: Inpatient    Bernadette Hoit MD [facility]  01/07/2021, 9:43 PM

## 2021-01-07 NOTE — ED Provider Notes (Signed)
Emergency Medicine Provider Triage Evaluation Note  Vanessa Romero , a 45 y.o. female  was evaluated in triage.  Pt complains of patient seen last night, is cancer patient being treated for cervical cancer with radiation and chemotherapy.  Diagnosed with C. difficile last night and was offered admission.  Patient thought she be able to handle at home.  Is back today for possible admission..  Review of Systems  Positive: C. difficile, weakness Negative: Chest pain shortness of breath  Physical Exam  LMP 01/04/2021  Gen:   Awake, no distress   Resp:  Normal effort  MSK:   Moves extremities without difficulty  Other:    Medical Decision Making  Medically screening exam initiated at 12:37 PM.  Appropriate orders placed.  Britt Watlington was informed that the remainder of the evaluation will be completed by another provider, this initial triage assessment does not replace that evaluation, and the importance of remaining in the ED until their evaluation is complete.     Versie Starks, PA-C 01/07/21 1237    Vanessa Boyle, MD 01/07/21 [phone]

## 2021-01-07 NOTE — Telephone Encounter (Signed)
Patient called reporting that she went to ER and they offered to admit her or said she could try to manage from home so she decided to go home, but has decided that was not the best choice and now wants something done. I advised that she return to the ER for admission and she agrees to this

## 2021-01-08 DIAGNOSIS — A09 Infectious gastroenteritis and colitis, unspecified: Secondary | ICD-10-CM

## 2021-01-08 LAB — CBC
HCT: 34.8 % — ABNORMAL LOW (ref 36.0–46.0)
Hemoglobin: 11 g/dL — ABNORMAL LOW (ref 12.0–15.0)
MCH: 25.7 pg — ABNORMAL LOW (ref 26.0–34.0)
MCHC: 31.6 g/dL (ref 30.0–36.0)
MCV: 81.3 fL (ref 80.0–100.0)
Platelets: 226 10*3/uL (ref 150–400)
RBC: 4.28 MIL/uL (ref 3.87–5.11)
WBC: 2.4 10*3/uL — ABNORMAL LOW (ref 4.0–10.5)
nRBC: 0 % (ref 0.0–0.2)

## 2021-01-08 LAB — COMPREHENSIVE METABOLIC PANEL
ALT: 13 U/L (ref 0–44)
AST: 17 U/L (ref 15–41)
Albumin: 2.7 g/dL — ABNORMAL LOW (ref 3.5–5.0)
Alkaline Phosphatase: 35 U/L — ABNORMAL LOW (ref 38–126)
Anion gap: 5 (ref 5–15)
BUN: 16 mg/dL (ref 6–20)
CO2: 22 mmol/L (ref 22–32)
Calcium: 7.8 mg/dL — ABNORMAL LOW (ref 8.9–10.3)
Chloride: 112 mmol/L — ABNORMAL HIGH (ref 98–111)
Creatinine, Ser: 0.53 mg/dL (ref 0.44–1.00)
GFR, Estimated: >60 mL/min (ref >60)
Glucose, Bld: 74 mg/dL (ref 70–99)
Potassium: 4 mmol/L (ref 3.5–5.1)
Sodium: 139 mmol/L (ref 135–145)
Total Bilirubin: 0.4 mg/dL (ref 0.3–1.2)
Total Protein: 5 g/dL — ABNORMAL LOW (ref 6.5–8.1)

## 2021-01-08 LAB — PHOSPHORUS: Phosphorus: 4.6 mg/dL (ref 2.5–4.6)

## 2021-01-08 LAB — GLUCOSE, CAPILLARY
Glucose-Capillary: 140 mg/dL — ABNORMAL HIGH (ref 70–99)
Glucose-Capillary: 63 mg/dL — ABNORMAL LOW (ref 70–99)

## 2021-01-08 LAB — CBG MONITORING, ED
Glucose-Capillary: 104 mg/dL — ABNORMAL HIGH (ref 70–99)
Glucose-Capillary: 66 mg/dL — ABNORMAL LOW (ref 70–99)
Glucose-Capillary: 68 mg/dL — ABNORMAL LOW (ref 70–99)

## 2021-01-08 LAB — MAGNESIUM: Magnesium: 1.8 mg/dL (ref 1.7–2.4)

## 2021-01-08 LAB — APTT: aPTT: 28 seconds (ref 24–36)

## 2021-01-08 MED ORDER — HYDROCODONE-ACETAMINOPHEN 5-325 MG PO TABS
2.0000 | ORAL_TABLET | Freq: Four times a day (QID) | ORAL | Status: DC | PRN
  Administered 2021-01-09 – 2021-01-14 (×17): 2 via ORAL
  Filled 2021-01-08 (×17): qty 2

## 2021-01-08 MED ORDER — HYDROCODONE-ACETAMINOPHEN 5-325 MG PO TABS
1.0000 | ORAL_TABLET | Freq: Four times a day (QID) | ORAL | Status: DC | PRN
  Administered 2021-01-08 (×2): 1 via ORAL
  Filled 2021-01-08 (×2): qty 1

## 2021-01-08 MED ORDER — ESCITALOPRAM OXALATE 20 MG PO TABS
20.0000 mg | ORAL_TABLET | Freq: Every day | ORAL | Status: DC
  Administered 2021-01-08 – 2021-01-14 (×7): 20 mg via ORAL
  Filled 2021-01-08 (×4): qty 1
  Filled 2021-01-08: qty 2
  Filled 2021-01-08 (×3): qty 1

## 2021-01-08 MED ORDER — VITAMIN B-12 1000 MCG PO TABS
1000.0000 ug | ORAL_TABLET | Freq: Every day | ORAL | Status: DC
  Administered 2021-01-08 – 2021-01-14 (×7): 1000 ug via ORAL
  Filled 2021-01-08 (×7): qty 1

## 2021-01-08 MED ORDER — TRAMADOL HCL 50 MG PO TABS
50.0000 mg | ORAL_TABLET | Freq: Four times a day (QID) | ORAL | Status: DC | PRN
  Administered 2021-01-08 (×2): 50 mg via ORAL
  Filled 2021-01-08 (×2): qty 1

## 2021-01-08 MED ORDER — CHLORHEXIDINE GLUCONATE CLOTH 2 % EX PADS
6.0000 | MEDICATED_PAD | Freq: Every day | CUTANEOUS | Status: DC
  Administered 2021-01-09 – 2021-01-14 (×6): 6 via TOPICAL

## 2021-01-08 MED ORDER — SUCRALFATE 1 G PO TABS
1.0000 g | ORAL_TABLET | Freq: Three times a day (TID) | ORAL | Status: DC
  Administered 2021-01-08 – 2021-01-14 (×17): 1 g via ORAL
  Filled 2021-01-08 (×15): qty 1

## 2021-01-08 MED ORDER — FOLIC ACID 1 MG PO TABS
1.0000 mg | ORAL_TABLET | Freq: Every day | ORAL | Status: DC
  Administered 2021-01-08 – 2021-01-14 (×7): 1 mg via ORAL
  Filled 2021-01-08 (×7): qty 1

## 2021-01-08 MED ORDER — HYDROCODONE-ACETAMINOPHEN 5-325 MG PO TABS
1.0000 | ORAL_TABLET | Freq: Once | ORAL | Status: AC
  Administered 2021-01-08: 23:00:00 1 via ORAL
  Filled 2021-01-08: qty 1

## 2021-01-08 MED ORDER — LAMOTRIGINE 25 MG PO TABS
50.0000 mg | ORAL_TABLET | Freq: Every day | ORAL | Status: DC
  Administered 2021-01-08 – 2021-01-14 (×7): 50 mg via ORAL
  Filled 2021-01-08 (×7): qty 2

## 2021-01-08 NOTE — Progress Notes (Signed)
PROGRESS NOTE    Vanessa Romero  PPJ:[id number] DOB: 01/14/1976 DOA: 01/07/2021 PCP: Anselmo Pickler, MD    Chief Complaint  Patient presents with   Abdominal Pain    Brief Narrative:   Vanessa Romero is a 45 y.o. female with medical history significant for cervical carcinoma currently undergoing chemotherapy and radiotherapy, gastric bypass, intracranial hypertension who presents to the emergency department due to about 5-day onset of abdominal pain, nausea, vomiting and diarrhea.  Patient tested positive for C. difficile with no C. difficile toxin per patient's oncologist medical record, but they wanted patient to be treated for an active infection.  Abdominal pain was sharp and burning and was rated as 7-8/10 on pain scale, it was intermittent and there was no alleviating/aggravating factor.  Patient states that she has not been able to tolerate any oral intake due to vomiting. In the emergency department, she was hemodynamically stable.  Work-up in the ED showed leukopenia, normocytic anemia, hypoglycemia, troponin x1 was negative, urinalysis was negative.  Influenza A, B, SARS coronavirus 2 was negative.  Full GI panel done by oncology was negative except for C. difficile without the C. difficile toxin CT abdomen and pelvis with contrast showed mild to moderate severity infectious or inflammatory colitis involving the descending and sigmoid colon Patient was started on vancomycin and symptomatic management with IV fluids and pain control.  Assessment & Plan:   Principal Problem:   Colitis due to Clostridioides difficile Active Problems:   Cervical cancer, FIGO stage IIB (HCC)   Leukopenia due to antineoplastic chemotherapy (HCC)   Abdominal pain   Nausea & vomiting   C. difficile colitis Continue with oral vancomycin to complete the course of 14 days. Symptomatic management with IV fluids, IV pain control Clear liquid diet and advance diet as  tolerated.    Leukopenia probably secondary to antineoplastic chemotherapy    History of cervical cancer -Active chemotherapy.    Mild anemia of chronic disease Continue to monitor  DVT prophylaxis: (Lovenox) Code Status: (Full code) Family Communication: none at bedside.  Disposition:   Status is: Inpatient  Remains inpatient appropriate because: IV pain control.        Consultants:  None.   Procedures: none.   Antimicrobials:  Antibiotics Given (last 72 hours)     Date/Time Action Medication Dose   01/07/21 1855 Given   vancomycin (VANCOCIN) capsule 125 mg 125 mg   01/07/21 2211 Given   vancomycin (VANCOCIN) capsule 125 mg 125 mg   01/08/21 0937 Given   vancomycin (VANCOCIN) capsule 125 mg 125 mg   01/08/21 1418 Given   vancomycin (VANCOCIN) capsule 125 mg 125 mg         Subjective: Persistent abdominal pain.   Objective: Vitals:   01/08/21 0600 01/08/21 0830 01/08/21 0940 01/08/21 1200  BP: 110/67  110/69 (!) 147/112  Pulse: 69 80 70 97  Resp: 15 15 16 12   Temp:      TempSrc:      SpO2: 99% 98% 98% 97%  Weight:      Height:       No intake or output data in the 24 hours ending 01/08/21 1441 Filed Weights   01/07/21 1238  Weight: 54.4 kg    Examination:  General exam: Appears calm and comfortable  Respiratory system: Clear to auscultation. Respiratory effort normal. Cardiovascular system: S1 & S2 heard, RRR. No JVD, . No pedal edema. Gastrointestinal system: Abdomen is soft, mildly tender.  Normal bowel sounds heard. Central nervous  system: Alert and oriented. No focal neurological deficits. Extremities: Symmetric 5 x 5 power. Skin: No rashes, lesions or ulcers Psychiatry: Mood & affect appropriate.     Data Reviewed: I have personally reviewed following labs and imaging studies  CBC: Recent Labs  Lab 01/04/21 0939 01/06/21 1323 01/07/21 1240 01/08/21 0658  WBC 3.5* 3.8* 3.3* 2.4*  NEUTROABS 2.2  --   --   --   HGB  10.4* 10.7* 11.1* 11.0*  HCT 33.3* 34.0* 35.7* 34.8*  MCV 79.9* 78.9* 81.0 81.3  PLT 245 243 260 341    Basic Metabolic Panel: Recent Labs  Lab 01/04/21 0939 01/06/21 1323 01/07/21 1240 01/08/21 0658  NA 137 134* 139 139  K 3.8 4.1 4.0 4.0  CL 106 108 108 112*  CO2 23 21* 25 22  GLUCOSE 89 107* 63* 74  BUN 18 13 14 16   CREATININE 0.56 0.61 0.69 0.53  CALCIUM 8.0* 8.2* 8.9 7.8*  MG  --   --   --  1.8  PHOS  --   --   --  4.6    GFR: Estimated Creatinine Clearance: 77.1 mL/min (by C-G formula based on SCr of 0.53 mg/dL).  Liver Function Tests: Recent Labs  Lab 01/04/21 0939 01/06/21 1323 01/07/21 1240 01/08/21 0658  AST 20 26 20 17   ALT 13 17 15 13   ALKPHOS 40 38 41 35*  BILITOT 0.2* 0.4 0.4 0.4  PROT 5.8* 5.8* 5.9* 5.0*  ALBUMIN 3.2* 3.2* 3.3* 2.7*    CBG: Recent Labs  Lab 01/08/21 0031 01/08/21 0440 01/08/21 0944  GLUCAP 66* 68* 104*     Recent Results (from the past 240 hour(s))  C difficile quick screen w PCR reflex     Status: Abnormal   Collection Time: 01/04/21  1:58 PM   Specimen: STOOL  Result Value Ref Range Status   C Diff antigen POSITIVE (A) NEGATIVE Final   C Diff toxin NEGATIVE NEGATIVE Final   C Diff interpretation Results are indeterminate. See PCR results.  Final    Comment: Performed at [facility], [address] [place], [place] [address]  Gastrointestinal Panel by PCR , Stool     Status: None   Collection Time: 01/04/21  1:58 PM   Specimen: Stool  Result Value Ref Range Status   Campylobacter species NOT DETECTED NOT DETECTED Final   Plesimonas shigelloides NOT DETECTED NOT DETECTED Final   Salmonella species NOT DETECTED NOT DETECTED Final   Yersinia enterocolitica NOT DETECTED NOT DETECTED Final   Vibrio species NOT DETECTED NOT DETECTED Final   Vibrio cholerae NOT DETECTED NOT DETECTED Final   Enteroaggregative E coli (EAEC) NOT DETECTED NOT DETECTED Final   Enteropathogenic E coli (EPEC) NOT DETECTED NOT  DETECTED Final   Enterotoxigenic E coli (ETEC) NOT DETECTED NOT DETECTED Final   Shiga like toxin producing E coli (STEC) NOT DETECTED NOT DETECTED Final   Shigella/Enteroinvasive E coli (EIEC) NOT DETECTED NOT DETECTED Final   Cryptosporidium NOT DETECTED NOT DETECTED Final   Cyclospora cayetanensis NOT DETECTED NOT DETECTED Final   Entamoeba histolytica NOT DETECTED NOT DETECTED Final   Giardia lamblia NOT DETECTED NOT DETECTED Final   Adenovirus F40/41 NOT DETECTED NOT DETECTED Final   Astrovirus NOT DETECTED NOT DETECTED Final   Norovirus GI/GII NOT DETECTED NOT DETECTED Final   Rotavirus A NOT DETECTED NOT DETECTED Final   Sapovirus (I, II, IV, and V) NOT DETECTED NOT DETECTED Final    Comment: Performed at [facility], [address], [place], [place] [address]  C. Diff by PCR, Reflexed     Status: Abnormal   Collection Time: 01/04/21  1:58 PM  Result Value Ref Range Status   Toxigenic C. Difficile by PCR POSITIVE (A) NEGATIVE Final    Comment: Positive for toxigenic C. difficile with little to no toxin production. Only treat if clinical presentation suggests symptomatic illness. Performed at [facility], [address] [place], [place] [address]   Resp Panel by RT-PCR (Flu A&B, Covid) Nasopharyngeal Swab     Status: None   Collection Time: 01/06/21  2:38 PM   Specimen: Nasopharyngeal Swab; Nasopharyngeal(NP) swabs in vial transport medium  Result Value Ref Range Status   SARS Coronavirus 2 by RT PCR NEGATIVE NEGATIVE Final    Comment: (NOTE) SARS-CoV-2 target nucleic acids are NOT DETECTED.  The SARS-CoV-2 RNA is generally detectable in upper respiratory specimens during the acute phase of infection. The lowest concentration of SARS-CoV-2 viral copies this assay can detect is 138 copies/mL. A negative result does not preclude SARS-Cov-2 infection and should not be used as the sole basis for treatment or other patient management decisions. A negative  result may occur with  improper specimen collection/handling, submission of specimen other than nasopharyngeal swab, presence of viral mutation(s) within the areas targeted by this assay, and inadequate number of viral copies(<138 copies/mL). A negative result must be combined with clinical observations, patient history, and epidemiological information. The expected result is Negative.  Fact Sheet for Patients:  EntrepreneurPulse.com.au  Fact Sheet for Healthcare Providers:  IncredibleEmployment.be  This test is no t yet approved or cleared by the [place] FDA and  has been authorized for detection and/or diagnosis of SARS-CoV-2 by FDA under an Emergency Use Authorization (EUA). This EUA will remain  in effect (meaning this test can be used) for the duration of the COVID-19 declaration under Section 564(b)(1) of the Act, 21 U.S.C.section 360bbb-3(b)(1), unless the authorization is terminated  or revoked sooner.       Influenza A by PCR NEGATIVE NEGATIVE Final   Influenza B by PCR NEGATIVE NEGATIVE Final    Comment: (NOTE) The Xpert Xpress SARS-CoV-2/FLU/RSV plus assay is intended as an aid in the diagnosis of influenza from Nasopharyngeal swab specimens and should not be used as a sole basis for treatment. Nasal washings and aspirates are unacceptable for Xpert Xpress SARS-CoV-2/FLU/RSV testing.  Fact Sheet for Patients: EntrepreneurPulse.com.au  Fact Sheet for Healthcare Providers: IncredibleEmployment.be  This test is not yet approved or cleared by the [place] FDA and has been authorized for detection and/or diagnosis of SARS-CoV-2 by FDA under an Emergency Use Authorization (EUA). This EUA will remain in effect (meaning this test can be used) for the duration of the COVID-19 declaration under Section 564(b)(1) of the Act, 21 U.S.C. section 360bbb-3(b)(1), unless the authorization is  terminated or revoked.  Performed at [facility], [address], [place], [place] [address]          Radiology Studies: CT ABDOMEN PELVIS W CONTRAST  Result Date: 01/06/2021 CLINICAL DATA:  Abdominal cramping and diarrhea. EXAM: CT ABDOMEN AND PELVIS WITH CONTRAST TECHNIQUE: Multidetector CT imaging of the abdomen and pelvis was performed using the standard protocol following bolus administration of intravenous contrast. CONTRAST:  21mL OMNIPAQUE IOHEXOL 300 MG/ML  SOLN COMPARISON:  None. FINDINGS: Lower chest: No acute abnormality. Hepatobiliary: No focal liver abnormality is seen. Status post cholecystectomy. Common bile duct is dilated and measures 1.4 cm. Pancreas: Unremarkable.  No pancreatic ductal dilatation or surrounding inflammatory changes. Spleen: Normal in size without focal abnormality. Adrenals/Urinary Tract: Adrenal glands are unremarkable. Kidneys are normal, without renal calculi, focal lesion, or hydronephrosis. The urinary bladder is partially empty and subsequently limited in evaluation. Stomach/Bowel: Surgical sutures are seen within the gastric region. Appendix appears normal. No evidence of bowel dilatation. Mild to moderate severity diffuse colonic wall thickening is seen throughout the descending and sigmoid colon. Vascular/Lymphatic: Very mild aortic atherosclerosis. No enlarged abdominal or pelvic lymph nodes. Reproductive: The uterus is unremarkable. A 2.1 cm diameter cyst is seen along the posterior aspect of the right adnexa. Other: No abdominal wall hernia or abnormality. No abdominopelvic ascites. Musculoskeletal: No acute or significant osseous findings. IMPRESSION: 1. Mild to moderate severity infectious or inflammatory colitis involving the descending and sigmoid colon. 2. Evidence of prior cholecystectomy. 3. 2.1 cm diameter right adnexal cyst, likely ovarian in origin. 4. Very mild aortic atherosclerosis. Aortic Atherosclerosis (ICD10-I70.0).  Electronically Signed   By: Virgina Norfolk M.D.   On: 01/06/2021 19:52        Scheduled Meds:  enoxaparin (LOVENOX) injection  40 mg Subcutaneous Q24H   escitalopram  20 mg Oral [id number]   folic acid  1 mg Oral Daily   lamoTRIgine  50 mg Oral Daily   pantoprazole (PROTONIX) IV  40 mg Intravenous Q24H   sucralfate  1 g Oral TID PC   vancomycin  125 mg Oral QID   cyanocobalamin  1,000 mcg Oral Daily   Continuous Infusions:  sodium chloride 75 mL/hr at 01/07/21 2337     LOS: 1 day        Hosie Poisson, MD [facility]   To contact the attending provider between 7A-7P or the covering provider during after hours 7P-7A, please log into the web site [email or web address] and access using universal [place] password for that web site. If you do not have the password, please call the hospital operator.  01/08/2021, 2:41 PM

## 2021-01-08 NOTE — ED Notes (Addendum)
Pt informed of her BS level - provided OJ - pt consuming her own [place]. Dew.

## 2021-01-08 NOTE — ED Notes (Addendum)
Messaged IP nurse regarding pt transferring to their floor to give report via secure chat.

## 2021-01-08 NOTE — ED Notes (Signed)
Pt offered OJ & encouraged to drink fluids

## 2021-01-08 NOTE — ED Notes (Signed)
Pt had bowel movement- diarrhea and reported had another BM of diarrhea approx. 30 minutes prior. No blood reported.

## 2021-01-09 ENCOUNTER — Inpatient Hospital Stay: Payer: Medicare Other

## 2021-01-09 DIAGNOSIS — R1084 Generalized abdominal pain: Secondary | ICD-10-CM

## 2021-01-09 LAB — BASIC METABOLIC PANEL
Anion gap: 3 — ABNORMAL LOW (ref 5–15)
BUN: 13 mg/dL (ref 6–20)
CO2: 24 mmol/L (ref 22–32)
Calcium: 8 mg/dL — ABNORMAL LOW (ref 8.9–10.3)
Chloride: 108 mmol/L (ref 98–111)
Creatinine, Ser: 0.56 mg/dL (ref 0.44–1.00)
GFR, Estimated: >60 mL/min (ref >60)
Glucose, Bld: 90 mg/dL (ref 70–99)
Potassium: 4.1 mmol/L (ref 3.5–5.1)
Sodium: 135 mmol/L (ref 135–145)

## 2021-01-09 LAB — CBC WITH DIFFERENTIAL/PLATELET
Abs Immature Granulocytes: 0.01 10*3/uL (ref 0.00–0.07)
Basophils Absolute: 0 10*3/uL (ref 0.0–0.1)
Basophils Relative: 1 %
Eosinophils Absolute: 0.1 10*3/uL (ref 0.0–0.5)
Eosinophils Relative: 7 %
HCT: 29.3 % — ABNORMAL LOW (ref 36.0–46.0)
Hemoglobin: 9.3 g/dL — ABNORMAL LOW (ref 12.0–15.0)
Immature Granulocytes: 1 %
Lymphocytes Relative: 23 %
Lymphs Abs: 0.5 10*3/uL — ABNORMAL LOW (ref 0.7–4.0)
MCH: 25.4 pg — ABNORMAL LOW (ref 26.0–34.0)
MCHC: 31.7 g/dL (ref 30.0–36.0)
MCV: 80.1 fL (ref 80.0–100.0)
Monocytes Absolute: 0.3 10*3/uL (ref 0.1–1.0)
Monocytes Relative: 13 %
Neutro Abs: 1.1 10*3/uL — ABNORMAL LOW (ref 1.7–7.7)
Neutrophils Relative %: 55 %
Platelets: 209 10*3/uL (ref 150–400)
RBC: 3.66 MIL/uL — ABNORMAL LOW (ref 3.87–5.11)
Smear Review: NORMAL
WBC: 2 10*3/uL — ABNORMAL LOW (ref 4.0–10.5)
nRBC: 0 % (ref 0.0–0.2)

## 2021-01-09 LAB — CBC
HCT: 29.5 % — ABNORMAL LOW (ref 36.0–46.0)
Hemoglobin: 9.5 g/dL — ABNORMAL LOW (ref 12.0–15.0)
MCH: 25.7 pg — ABNORMAL LOW (ref 26.0–34.0)
MCHC: 32.2 g/dL (ref 30.0–36.0)
MCV: 79.7 fL — ABNORMAL LOW (ref 80.0–100.0)
Platelets: 210 10*3/uL (ref 150–400)
RBC: 3.7 MIL/uL — ABNORMAL LOW (ref 3.87–5.11)
WBC: 2 10*3/uL — ABNORMAL LOW (ref 4.0–10.5)
nRBC: 0 % (ref 0.0–0.2)

## 2021-01-09 LAB — GLUCOSE, CAPILLARY
Glucose-Capillary: 80 mg/dL (ref 70–99)
Glucose-Capillary: 111 mg/dL — ABNORMAL HIGH (ref 70–99)
Glucose-Capillary: 82 mg/dL (ref 70–99)
Glucose-Capillary: 103 mg/dL — ABNORMAL HIGH (ref 70–99)

## 2021-01-09 MED ORDER — HYDROMORPHONE HCL 1 MG/ML IJ SOLN
1.0000 mg | INTRAMUSCULAR | Status: DC | PRN
  Administered 2021-01-10 – 2021-01-13 (×20): 1 mg via INTRAVENOUS
  Filled 2021-01-09 (×23): qty 1

## 2021-01-09 MED ORDER — FENTANYL 25 MCG/HR TD PT72
1.0000 | MEDICATED_PATCH | TRANSDERMAL | Status: DC
  Administered 2021-01-09 – 2021-01-12 (×2): 1 via TRANSDERMAL
  Filled 2021-01-09 (×2): qty 1

## 2021-01-09 MED ORDER — SODIUM CHLORIDE 0.9% FLUSH: 10.0000 mL | INTRAVENOUS | Status: DC | PRN

## 2021-01-09 MED ORDER — METRONIDAZOLE 500 MG/100ML IV SOLN
500.0000 mg | Freq: Three times a day (TID) | INTRAVENOUS | Status: DC
  Administered 2021-01-09 – 2021-01-14 (×16): 500 mg via INTRAVENOUS
  Filled 2021-01-09 (×17): qty 100

## 2021-01-09 MED ORDER — VANCOMYCIN HCL 250 MG PO CAPS
500.0000 mg | ORAL_CAPSULE | Freq: Four times a day (QID) | ORAL | Status: DC
  Administered 2021-01-09 – 2021-01-14 (×20): 500 mg via ORAL
  Filled 2021-01-09 (×23): qty 2

## 2021-01-09 MED ORDER — ALPRAZOLAM 0.5 MG PO TABS
0.5000 mg | ORAL_TABLET | Freq: Two times a day (BID) | ORAL | Status: DC | PRN
  Administered 2021-01-09 – 2021-01-14 (×8): 0.5 mg via ORAL
  Filled 2021-01-09 (×9): qty 1

## 2021-01-09 MED ORDER — ONDANSETRON HCL 4 MG/2ML IJ SOLN
4.0000 mg | Freq: Three times a day (TID) | INTRAMUSCULAR | Status: DC | PRN
  Administered 2021-01-09 – 2021-01-10 (×2): 4 mg via INTRAVENOUS
  Filled 2021-01-09 (×2): qty 2

## 2021-01-09 MED ORDER — SODIUM CHLORIDE 0.9% FLUSH
10.0000 mL | Freq: Two times a day (BID) | INTRAVENOUS | Status: DC
  Administered 2021-01-09 – 2021-01-13 (×7): 10 mL

## 2021-01-09 MED ORDER — HYDROMORPHONE HCL 1 MG/ML IJ SOLN
1.0000 mg | INTRAMUSCULAR | Status: DC | PRN
  Administered 2021-01-09: 09:00:00 1 mg via INTRAVENOUS
  Filled 2021-01-09: qty 1

## 2021-01-09 NOTE — Plan of Care (Signed)

## 2021-01-09 NOTE — Progress Notes (Signed)
PROGRESS NOTE    Vanessa Romero  TMH:[id number] DOB: Jan 20, 1975 DOA: 01/07/2021 PCP: Anselmo Pickler, MD    Chief Complaint  Patient presents with   Abdominal Pain    Brief Narrative:   Vanessa Romero is a 45 y.o. female with medical history significant for cervical carcinoma currently undergoing chemotherapy and radiotherapy, gastric bypass, intracranial hypertension who presents to the emergency department due to about 5-day onset of abdominal pain, nausea, vomiting and diarrhea.  Patient tested positive for C. difficile with no C. difficile toxin per patient's oncologist medical record, but they wanted patient to be treated for an active infection.  Abdominal pain was sharp and burning and was rated as 7-8/10 on pain scale, it was intermittent and there was no alleviating/aggravating factor.  Patient states that she has not been able to tolerate any oral intake due to vomiting. In the emergency department, she was hemodynamically stable.  Work-up in the ED showed leukopenia, normocytic anemia, hypoglycemia, troponin x1 was negative, urinalysis was negative.  Influenza A, B, SARS coronavirus 2 was negative.  Full GI panel done by oncology was negative except for C. difficile without the C. difficile toxin CT abdomen and pelvis with contrast showed mild to moderate severity infectious or inflammatory colitis involving the descending and sigmoid colon Patient was started on vancomycin and symptomatic management with IV fluids and pain control.  pRN reported this morning that pt is requiring IV dilaudid every 2 hours . Pt reports that she is nauseated and has more abdominal pain in the right quadrant.  Repeat CT abdomen done , showed Progressive and now severe inflammation of distal small bowel in the pelvis, including the terminal ileum. And ongoing distal colitis. Suspect Progressive Clostridium Difficile Enterocolitis in this setting. No pneumoperitoneum or pneumatosis  identified.   Assessment & Plan:   Principal Problem:   Colitis due to Clostridioides difficile Active Problems:   Cervical cancer, FIGO stage IIB (HCC)   Leukopenia due to antineoplastic chemotherapy (HCC)   Abdominal pain   Nausea & vomiting  Progressive and severe C diff enterocolitis:  Increase the oral vancomycin to higher dose of 500 mg every 6 hours , add IV flagyl to the regimen.  Symptomatic management with IV fluids, IV pain control ( added fentanyl patch 74mcg) and IV zofran.  Clear liquid diet for now.  Dicussed with the RN.     Leukopenia probably secondary to antineoplastic chemotherapy Differential added.    History of cervical cancer last chemotherapy done on Wednesday.  Concurrent radiation treatment.  S/p 3 rd carbo/Taxol.     Mild anemia of chronic disease Baseline hemoglobin between 10 to 11. Dropped to 9.5 this morning.  Continue to monitor Transfuse to keep hemoglobin greater than 7.   DVT prophylaxis: (Lovenox) Code Status: (Full code) Family Communication: none at bedside.  Disposition:   Status is: Inpatient  Remains inpatient appropriate because: IV pain control.        Consultants:  None.   Procedures: none.   Antimicrobials:  Antibiotics Given (last 72 hours)     Date/Time Action Medication Dose   01/07/21 1855 Given   vancomycin (VANCOCIN) capsule 125 mg 125 mg   01/07/21 2211 Given   vancomycin (VANCOCIN) capsule 125 mg 125 mg   01/08/21 0937 Given   vancomycin (VANCOCIN) capsule 125 mg 125 mg   01/08/21 1418 Given   vancomycin (VANCOCIN) capsule 125 mg 125 mg   01/08/21 1833 Given   vancomycin (VANCOCIN) capsule 125 mg 125 mg  01/08/21 2236 Given   vancomycin (VANCOCIN) capsule 125 mg 125 mg   01/09/21 1057 Given   vancomycin (VANCOCIN) capsule 125 mg 125 mg         Subjective: Added fentanyl patch to pain control. No chest pain, persistent abdominal pain and nausea.   Objective: Vitals:   01/08/21 2104  01/08/21 2126 01/09/21 0450 01/09/21 0923  BP: 109/65 (!) 104/56 99/74 (!) 110/57  Pulse: 73 72 79 76  Resp: 18 18 18 18   Temp: 98.1 F (36.7 C) 98.3 F (36.8 C) 98.3 F (36.8 C) 98.7 F (37.1 C)  TempSrc:      SpO2: 100% 98% 99% 100%  Weight: 59.2 kg     Height: 5' 7.01" (1.702 m)       Intake/Output Summary (Last 24 hours) at 01/09/2021 1306 Last data filed at 01/09/2021 0359 Gross per 24 hour  Intake 1358 ml  Output --  Net 1358 ml   Filed Weights   01/07/21 1238 01/08/21 2104  Weight: 54.4 kg 59.2 kg    Examination:  General exam: Appears calm and comfortable  Respiratory system: Clear to auscultation. Respiratory effort normal. Cardiovascular system: S1 & S2 heard, RRR. No JVD,  No pedal edema. Gastrointestinal system: Abdomen is soft, tender in the RLQ, normal bowel sounds.  Central nervous system: Alert and oriented. No focal neurological deficits. Extremities: Symmetric 5 x 5 power. Skin: No rashes, lesions or ulcers Psychiatry: Mood & affect appropriate.      Data Reviewed: I have personally reviewed following labs and imaging studies  CBC: Recent Labs  Lab 01/04/21 0939 01/06/21 1323 01/07/21 1240 01/08/21 0658 01/09/21 1145  WBC 3.5* 3.8* 3.3* 2.4* 2.0*  NEUTROABS 2.2  --   --   --   --   HGB 10.4* 10.7* 11.1* 11.0* 9.5*  HCT 33.3* 34.0* 35.7* 34.8* 29.5*  MCV 79.9* 78.9* 81.0 81.3 79.7*  PLT 245 243 260 226 210     Basic Metabolic Panel: Recent Labs  Lab 01/04/21 0939 01/06/21 1323 01/07/21 1240 01/08/21 0658 01/09/21 1145  NA 137 134* 139 139 135  K 3.8 4.1 4.0 4.0 4.1  CL 106 108 108 112* 108  CO2 23 21* 25 22 24   GLUCOSE 89 107* 63* 74 90  BUN 18 13 14 16 13   CREATININE 0.56 0.61 0.69 0.53 0.56  CALCIUM 8.0* 8.2* 8.9 7.8* 8.0*  MG  --   --   --  1.8  --   PHOS  --   --   --  4.6  --      GFR: Estimated Creatinine Clearance: 83.9 mL/min (by C-G formula based on SCr of 0.56 mg/dL).  Liver Function Tests: Recent Labs   Lab 01/04/21 0939 01/06/21 1323 01/07/21 1240 01/08/21 0658  AST 20 26 20 17   ALT 13 17 15 13   ALKPHOS 40 38 41 35*  BILITOT 0.2* 0.4 0.4 0.4  PROT 5.8* 5.8* 5.9* 5.0*  ALBUMIN 3.2* 3.2* 3.3* 2.7*     CBG: Recent Labs  Lab 01/08/21 2047 01/08/21 2351 01/09/21 0448 01/09/21 0921 01/09/21 1213  GLUCAP 140* 63* 80 111* 82      Recent Results (from the past 240 hour(s))  C difficile quick screen w PCR reflex     Status: Abnormal   Collection Time: 01/04/21  1:58 PM   Specimen: STOOL  Result Value Ref Range Status   C Diff antigen POSITIVE (A) NEGATIVE Final   C Diff toxin NEGATIVE NEGATIVE Final  C Diff interpretation Results are indeterminate. See PCR results.  Final    Comment: Performed at [facility], [address] [place], [place] [address]  Gastrointestinal Panel by PCR , Stool     Status: None   Collection Time: 01/04/21  1:58 PM   Specimen: Stool  Result Value Ref Range Status   Campylobacter species NOT DETECTED NOT DETECTED Final   Plesimonas shigelloides NOT DETECTED NOT DETECTED Final   Salmonella species NOT DETECTED NOT DETECTED Final   Yersinia enterocolitica NOT DETECTED NOT DETECTED Final   Vibrio species NOT DETECTED NOT DETECTED Final   Vibrio cholerae NOT DETECTED NOT DETECTED Final   Enteroaggregative E coli (EAEC) NOT DETECTED NOT DETECTED Final   Enteropathogenic E coli (EPEC) NOT DETECTED NOT DETECTED Final   Enterotoxigenic E coli (ETEC) NOT DETECTED NOT DETECTED Final   Shiga like toxin producing E coli (STEC) NOT DETECTED NOT DETECTED Final   Shigella/Enteroinvasive E coli (EIEC) NOT DETECTED NOT DETECTED Final   Cryptosporidium NOT DETECTED NOT DETECTED Final   Cyclospora cayetanensis NOT DETECTED NOT DETECTED Final   Entamoeba histolytica NOT DETECTED NOT DETECTED Final   Giardia lamblia NOT DETECTED NOT DETECTED Final   Adenovirus F40/41 NOT DETECTED NOT DETECTED Final   Astrovirus NOT DETECTED NOT DETECTED Final    Norovirus GI/GII NOT DETECTED NOT DETECTED Final   Rotavirus A NOT DETECTED NOT DETECTED Final   Sapovirus (I, II, IV, and V) NOT DETECTED NOT DETECTED Final    Comment: Performed at [facility], [address] [place], [place] [address]  C. Diff by PCR, Reflexed     Status: Abnormal   Collection Time: 01/04/21  1:58 PM  Result Value Ref Range Status   Toxigenic C. Difficile by PCR POSITIVE (A) NEGATIVE Final    Comment: Positive for toxigenic C. difficile with little to no toxin production. Only treat if clinical presentation suggests symptomatic illness. Performed at [facility], [address] [place], [place] [address]   Resp Panel by RT-PCR (Flu A&B, Covid) Nasopharyngeal Swab     Status: None   Collection Time: 01/06/21  2:38 PM   Specimen: Nasopharyngeal Swab; Nasopharyngeal(NP) swabs in vial transport medium  Result Value Ref Range Status   SARS Coronavirus 2 by RT PCR NEGATIVE NEGATIVE Final    Comment: (NOTE) SARS-CoV-2 target nucleic acids are NOT DETECTED.  The SARS-CoV-2 RNA is generally detectable in upper respiratory specimens during the acute phase of infection. The lowest concentration of SARS-CoV-2 viral copies this assay can detect is 138 copies/mL. A negative result does not preclude SARS-Cov-2 infection and should not be used as the sole basis for treatment or other patient management decisions. A negative result may occur with  improper specimen collection/handling, submission of specimen other than nasopharyngeal swab, presence of viral mutation(s) within the areas targeted by this assay, and inadequate number of viral copies(<138 copies/mL). A negative result must be combined with clinical observations, patient history, and epidemiological information. The expected result is Negative.  Fact Sheet for Patients:  EntrepreneurPulse.com.au  Fact Sheet for Healthcare Providers:   IncredibleEmployment.be  This test is no t yet approved or cleared by the [place] FDA and  has been authorized for detection and/or diagnosis of SARS-CoV-2 by FDA under an Emergency Use Authorization (EUA). This EUA will remain  in effect (meaning this test can be used) for the duration of the COVID-19 declaration under Section 564(b)(1) of the Act, 21 U.S.C.section 360bbb-3(b)(1), unless the authorization is terminated  or revoked sooner.       Influenza A by PCR NEGATIVE NEGATIVE Final   Influenza B by PCR NEGATIVE NEGATIVE Final    Comment: (NOTE) The Xpert Xpress SARS-CoV-2/FLU/RSV plus assay is intended as an aid in the diagnosis of influenza from Nasopharyngeal swab specimens and should not be used as a sole basis for treatment. Nasal washings and aspirates are unacceptable for Xpert Xpress SARS-CoV-2/FLU/RSV testing.  Fact Sheet for Patients: EntrepreneurPulse.com.au  Fact Sheet for Healthcare Providers: IncredibleEmployment.be  This test is not yet approved or cleared by the [place] FDA and has been authorized for detection and/or diagnosis of SARS-CoV-2 by FDA under an Emergency Use Authorization (EUA). This EUA will remain in effect (meaning this test can be used) for the duration of the COVID-19 declaration under Section 564(b)(1) of the Act, 21 U.S.C. section 360bbb-3(b)(1), unless the authorization is terminated or revoked.  Performed at [facility], [address] [place], [place] [address]           Radiology Studies: CT ABDOMEN PELVIS WO CONTRAST  Result Date: 01/09/2021 CLINICAL DATA:  45 year old female with increasing abdominal pain. Distal colitis on CT several days ago. Cervical cancer. Positive labs for for C difficile on 01/05/2021. EXAM: CT ABDOMEN AND PELVIS WITHOUT CONTRAST TECHNIQUE: Multidetector CT imaging of the abdomen and pelvis was performed following the  standard protocol without IV contrast. COMPARISON:  CT Abdomen and Pelvis 01/06/2021. FINDINGS: Lower chest: Negative. Hepatobiliary: Absent gallbladder. Negative noncontrast liver aside from trace inferior perihepatic free fluid with simple fluid density on series 2, image 43. Pancreas: Negative noncontrast pancreas. Spleen: Negative. Adrenals/Urinary Tract: Normal adrenal glands. Noncontrast kidneys appear stable and nonobstructed. Bladder seems to remain normal. Incidental pelvic phleboliths. Stomach/Bowel: Previous Roux-en-Y type gastric bypass. No dilated small or large bowel loops, but distal small bowel in the anterior pelvis appears severely inflamed (series 2, image 67) with pronounced wall thickening bowel wall thickening and edema continuing to the terminal ileum and ileocecal valve (image 59). Right: And transverse colon appear within normal limits. Splenic flexure within normal limits. Gradual transition in the distal descending colon 2 inflamed sigmoid and rectum with circumferential wall thickening. Oval roughly 10 mm capsule appears impacted at the gastroesophageal junction on series 2, image 13, but there is no upstream esophageal dilatation evident. Bypassed portion of the stomach contains a small volume of fluid as before. Small volume of fluid also in the duodenum. No free air. There is trace free fluid in the abdomen. Vascular/Lymphatic: Mild Calcified aortic atherosclerosis. Normal caliber abdominal aorta. Vascular patency is not evaluated in the absence of IV contrast. Reproductive: Retroverted uterus as before. Otherwise negative noncontrast appearance. Other: Pelvic mesenteric edema but no definite pelvic free fluid. Musculoskeletal: No acute osseous abnormality identified. IMPRESSION: 1. Progressive and now severe inflammation of distal small bowel in the pelvis, including the terminal ileum. And ongoing distal colitis. Suspect Progressive Clostridium Difficile Enterocolitis in this  setting. No pneumoperitoneum or pneumatosis identified. Trace free fluid. 2. Suspect impacted tablet or capsule at the GEJ. But no evidence of associated esophageal obstruction. 3. No other acute or inflammatory process identified on noncontrast CT abdomen and pelvis. Electronically Signed   By: Genevie Ann M.D.   On: 01/09/2021 10:54        Scheduled Meds:  Chlorhexidine Gluconate Cloth  6 each Topical Daily   enoxaparin (LOVENOX) injection  40 mg Subcutaneous Q24H   escitalopram  20 mg Oral Q1200   fentaNYL  1 patch Transdermal  O17P   folic acid  1 mg Oral Daily   lamoTRIgine  50 mg Oral Daily   pantoprazole (PROTONIX) IV  40 mg Intravenous Q24H   sucralfate  1 g Oral TID PC   vancomycin  500 mg Oral QID   cyanocobalamin  1,000 mcg Oral Daily   Continuous Infusions:  sodium chloride 75 mL/hr at 01/09/21 0359   metronidazole       LOS: 2 days        Hosie Poisson, MD [facility]   To contact the attending provider between 7A-7P or the covering provider during after hours 7P-7A, please log into the web site [email or web address] and access using universal [place] password for that web site. If you do not have the password, please call the hospital operator.  01/09/2021, 1:06 PM

## 2021-01-10 DIAGNOSIS — R109 Unspecified abdominal pain: Secondary | ICD-10-CM | POA: Diagnosis not present

## 2021-01-10 DIAGNOSIS — A0472 Enterocolitis due to Clostridium difficile, not specified as recurrent: Secondary | ICD-10-CM | POA: Diagnosis not present

## 2021-01-10 DIAGNOSIS — E440 Moderate protein-calorie malnutrition: Secondary | ICD-10-CM | POA: Insufficient documentation

## 2021-01-10 LAB — BASIC METABOLIC PANEL
Anion gap: 3 — ABNORMAL LOW (ref 5–15)
BUN: 10 mg/dL (ref 6–20)
CO2: 23 mmol/L (ref 22–32)
Calcium: 7.8 mg/dL — ABNORMAL LOW (ref 8.9–10.3)
Chloride: 107 mmol/L (ref 98–111)
Creatinine, Ser: 0.53 mg/dL (ref 0.44–1.00)
GFR, Estimated: >60 mL/min (ref >60)
Glucose, Bld: 102 mg/dL — ABNORMAL HIGH (ref 70–99)
Potassium: 3.5 mmol/L (ref 3.5–5.1)
Sodium: 133 mmol/L — ABNORMAL LOW (ref 135–145)

## 2021-01-10 LAB — CBC
HCT: 29.2 % — ABNORMAL LOW (ref 36.0–46.0)
Hemoglobin: 9.4 g/dL — ABNORMAL LOW (ref 12.0–15.0)
MCH: 25.8 pg — ABNORMAL LOW (ref 26.0–34.0)
MCHC: 32.2 g/dL (ref 30.0–36.0)
MCV: 80.2 fL (ref 80.0–100.0)
Platelets: 181 10*3/uL (ref 150–400)
RBC: 3.64 MIL/uL — ABNORMAL LOW (ref 3.87–5.11)
WBC: 2.5 10*3/uL — ABNORMAL LOW (ref 4.0–10.5)
nRBC: 0 % (ref 0.0–0.2)

## 2021-01-10 LAB — GLUCOSE, CAPILLARY
Glucose-Capillary: 100 mg/dL — ABNORMAL HIGH (ref 70–99)
Glucose-Capillary: 109 mg/dL — ABNORMAL HIGH (ref 70–99)
Glucose-Capillary: 113 mg/dL — ABNORMAL HIGH (ref 70–99)
Glucose-Capillary: 60 mg/dL — ABNORMAL LOW (ref 70–99)
Glucose-Capillary: 77 mg/dL (ref 70–99)
Glucose-Capillary: 83 mg/dL (ref 70–99)

## 2021-01-10 MED ORDER — VITAMIN D 25 MCG (1000 UNIT) PO TABS
1000.0000 [IU] | ORAL_TABLET | Freq: Every day | ORAL | Status: DC
  Administered 2021-01-11 – 2021-01-14 (×4): 1000 [IU] via ORAL
  Filled 2021-01-10 (×4): qty 1

## 2021-01-10 MED ORDER — SODIUM CHLORIDE 0.9 % IV SOLN
12.5000 mg | Freq: Four times a day (QID) | INTRAVENOUS | Status: DC | PRN
  Administered 2021-01-10 – 2021-01-12 (×5): 12.5 mg via INTRAVENOUS
  Filled 2021-01-10 (×4): qty 12.5
  Filled 2021-01-10: qty 0.5

## 2021-01-10 MED ORDER — DEXTROSE 50 % IV SOLN
1.0000 | INTRAVENOUS | Status: DC | PRN
  Administered 2021-01-11: 03:00:00 50 mL via INTRAVENOUS
  Filled 2021-01-10: qty 50

## 2021-01-10 MED ORDER — ADULT MULTIVITAMIN W/MINERALS CH
1.0000 | ORAL_TABLET | Freq: Every day | ORAL | Status: DC
  Administered 2021-01-11 – 2021-01-14 (×4): 1 via ORAL
  Filled 2021-01-10 (×4): qty 1

## 2021-01-10 MED ORDER — KATE FARMS STANDARD 1.4 PO LIQD
325.0000 mL | Freq: Three times a day (TID) | ORAL | Status: DC
  Administered 2021-01-10 – 2021-01-14 (×10): 325 mL via ORAL
  Filled 2021-01-10: qty 325

## 2021-01-10 MED ORDER — DEXTROSE 10 % IV SOLN: INTRAVENOUS | Status: DC

## 2021-01-10 NOTE — Progress Notes (Deleted)
[facility]  [phone] [phone]) [phone]  ID: Vanessa Romero OB: October 06, 1975  MR#: [id number]  WHQ#:[id number]  Patient Care Team: Anselmo Pickler, MD as PCP - General (Internal Medicine)  CHIEF COMPLAINT: Stage IIb adenocarcinoma of the cervix.  INTERVAL HISTORY: Patient returns to clinic today for further evaluation and consideration of cycle 2 of weekly carboplatinum and Taxol.  She is tolerating her treatments well.  She continues to have significant pelvic and back pain that is on relieved with tramadol.  She has no neurologic complaints.  She denies any recent fevers or illnesses.  She has a good appetite and denies weight loss. She has no chest pain, shortness of breath, cough, or hemoptysis.  She denies any nausea, vomiting, constipation, or diarrhea.  She has no urinary complaints.  Patient offers no further specific complaints today.  REVIEW OF SYSTEMS:   Review of Systems  Constitutional: Negative.  Negative for fever, malaise/fatigue and weight loss.  Respiratory: Negative.  Negative for cough, hemoptysis and shortness of breath.   Cardiovascular: Negative.  Negative for chest pain and leg swelling.  Gastrointestinal: Negative.  Negative for abdominal pain.  Genitourinary: Negative.  Negative for dysuria.  Musculoskeletal:  Positive for back pain.  Skin: Negative.  Negative for rash.  Neurological: Negative.  Negative for dizziness, focal weakness, weakness and headaches.  Psychiatric/Behavioral: Negative.  The patient is not nervous/anxious.    As per HPI. Otherwise, a complete review of systems is negative.  PAST MEDICAL HISTORY: Past Medical History:  Diagnosis Date   Alcohol abuse    Anemia    Cancer ([place])    Tobacco dependence     PAST SURGICAL HISTORY: Past Surgical History:  Procedure Laterality Date   ABDOMINAL ADHESION SURGERY     bowel obstruction     x2   CERVICAL CONIZATION W/BX N/A 11/26/2020   Procedure:  CONIZATION CERVIX WITH BIOPSY;  Surgeon: Malachy Mood, MD;  Location: [facility] ORS;  Service: Gynecology;  Laterality: N/A;   ESOPHAGOGASTRODUODENOSCOPY N/A 11/24/2020   Procedure: ESOPHAGOGASTRODUODENOSCOPY (EGD);  Surgeon: Lin Landsman, MD;  Location: [facility] ENDOSCOPY;  Service: Gastroenterology;  Laterality: N/A;   laparoscopic knee surgery     PORTA CATH INSERTION N/A 12/20/2020   Procedure: PORTA CATH INSERTION;  Surgeon: Algernon Huxley, MD;  Location: [place] [facility];  Service: Cardiovascular;  Laterality: N/A;   ROUX-EN-Y GASTRIC BYPASS     TEAR DUCT PROBING     unclogg   VAGOTOMY     VENTRICULOPERITONEAL SHUNT     x6 put in and removals    FAMILY HISTORY: Family History  Problem Relation Age of Onset   Cancer Mother    Cancer Father    Cancer Maternal Grandmother     ADVANCED DIRECTIVES (Y/N):  N  HEALTH MAINTENANCE: Social History   Tobacco Use   Smoking status: Former    Types: Cigarettes   Smokeless tobacco: Former  Scientific laboratory technician Use: Former  Substance Use Topics   Alcohol use: Not Currently   Drug use: Not Currently     Colonoscopy:  PAP:  Bone density:  Lipid panel:  Allergies  Allergen Reactions   Contrast Media [Iodinated Contrast Media] Hives   Gabapentin Other (See Comments), Rash and Palpitations    Other Reaction: tachycardia Other Reaction: tachycardia    Morphine Dermatitis, Hives, Rash, Swelling and Other (See Comments)    Other reaction(s): Unknown (comments) Has tolerated hydromorphone (Dilaudid) Immediate after injections arm edema and arm turned  bright red Immediate after injections arm edema and arm turned bright red IV Morphine IV Morphine    Sumatriptan Dermatitis, Hives, Itching, Other (See Comments) and Swelling    Other reaction(s): Joint Pain, Other (See Comments), Other (see comments), Unknown (comments) lock jaw Lock jaw Lock jaw Lock jaw TIGHTENING OF JAW Lock jaw lock jaw Lock jaw TIGHTENING OF  JAW Lock jaw    Zolpidem Nausea And Vomiting and Other (See Comments)    Other reaction(s): Other (see comments) sleep walking sleep walking Sleep walking  don't tolerate it well    Erythromycin Diarrhea, Nausea And Vomiting and Nausea Only    Extreme upset stomach    Valproic Acid Rash    Other reaction(s): Other (see comments), Unknown MOOD DISORDER MOOD DISORDER Depakote: Reaction unknown     Acetazolamide     Other reaction(s): Unknown (comments)   Erythromycin Base     Other reaction(s): UNKNOWN   Amoxicillin Rash   Divalproex Sodium Anxiety and Other (See Comments)    No current facility-administered medications for this visit.   No current outpatient medications on file.   Facility-Administered Medications Ordered in Other Visits  Medication Dose Route Frequency Provider Last Rate Last Admin   0.9 %  sodium chloride infusion   Intravenous Continuous Sharion Settler, NP 75 mL/hr at 01/10/21 0443 New Bag at 01/10/21 0443   ALPRAZolam Duanne Moron) tablet 0.5 mg  0.5 mg Oral BID PRN Hosie Poisson, MD   0.5 mg at 01/10/21 1093   Chlorhexidine Gluconate Cloth 2 % PADS 6 each  6 each Topical Daily Adefeso, Oladapo, DO   6 each at 01/09/21 0912   enoxaparin (LOVENOX) injection 40 mg  40 mg Subcutaneous Q24H Adefeso, Oladapo, DO   40 mg at 01/09/21 2126   escitalopram (LEXAPRO) tablet 20 mg  20 mg Oral Q1200 Hosie Poisson, MD   20 mg at 01/09/21 1057   fentaNYL (DURAGESIC) 25 MCG/HR 1 patch  1 patch Transdermal Q72H Hosie Poisson, MD   1 patch at 23/55/73 2202   folic acid (FOLVITE) tablet 1 mg  1 mg Oral Daily Hosie Poisson, MD   1 mg at 01/09/21 1006   heparin lock flush 100 UNIT/ML injection            HYDROcodone-acetaminophen (NORCO/VICODIN) 5-325 MG per tablet 2 tablet  2 tablet Oral Q6H PRN Sharion Settler, NP   2 tablet at 01/10/21 0300   HYDROmorphone (DILAUDID) injection 1 mg  1 mg Intravenous Q2H PRN Hosie Poisson, MD   1 mg at 01/10/21 0737   lamoTRIgine (LAMICTAL)  tablet 50 mg  50 mg Oral Daily Hosie Poisson, MD   50 mg at 01/09/21 1006   metroNIDAZOLE (FLAGYL) IVPB 500 mg  500 mg Intravenous Q8H Hosie Poisson, MD 100 mL/hr at 01/10/21 0443 500 mg at 01/10/21 0443   ondansetron (ZOFRAN) injection 4 mg  4 mg Intravenous Q8H PRN Hosie Poisson, MD   4 mg at 01/09/21 0911   pantoprazole (PROTONIX) injection 40 mg  40 mg Intravenous Q24H Adefeso, Oladapo, DO   40 mg at 01/09/21 2126   prochlorperazine (COMPAZINE) injection 10 mg  10 mg Intravenous Q6H PRN Adefeso, Oladapo, DO   10 mg at 01/10/21 0300   sodium chloride flush (NS) 0.9 % injection 10-40 mL  10-40 mL Intracatheter Q12H Hosie Poisson, MD   10 mL at 01/09/21 2128   sodium chloride flush (NS) 0.9 % injection 10-40 mL  10-40 mL Intracatheter PRN Hosie Poisson, MD  sucralfate (CARAFATE) tablet 1 g  1 g Oral TID PC Hosie Poisson, MD   1 g at 01/09/21 1346   vancomycin (VANCOCIN) capsule 500 mg  500 mg Oral QID Hosie Poisson, MD   500 mg at 01/09/21 2126   vitamin B-12 (CYANOCOBALAMIN) tablet 1,000 mcg  1,000 mcg Oral Daily Hosie Poisson, MD   1,000 mcg at 01/09/21 1006    OBJECTIVE: There were no vitals filed for this visit.    There is no height or weight on file to calculate BMI.    ECOG FS:0 - Asymptomatic  General: Well-developed, well-nourished, no acute distress. Eyes: Pink conjunctiva, anicteric sclera. HEENT: Normocephalic, moist mucous membranes. Lungs: No audible wheezing or coughing. Heart: Regular rate and rhythm. Abdomen: Soft, nontender, no obvious distention. Musculoskeletal: No edema, cyanosis, or clubbing. Neuro: Alert, answering all questions appropriately. Cranial nerves grossly intact. Skin: No rashes or petechiae noted. Psych: Normal affect.  LAB RESULTS:  Lab Results  Component Value Date   NA 133 (L) 01/10/2021   K 3.5 01/10/2021   CL 107 01/10/2021   CO2 23 01/10/2021   GLUCOSE 102 (H) 01/10/2021   BUN 10 01/10/2021   CREATININE 0.53 01/10/2021   CALCIUM 7.8  (L) 01/10/2021   PROT 5.0 (L) 01/08/2021   ALBUMIN 2.7 (L) 01/08/2021   AST 17 01/08/2021   ALT 13 01/08/2021   ALKPHOS 35 (L) 01/08/2021   BILITOT 0.4 01/08/2021   GFRNONAA >60 01/10/2021    Lab Results  Component Value Date   WBC 2.5 (L) 01/10/2021   NEUTROABS 1.1 (L) 01/09/2021   HGB 9.4 (L) 01/10/2021   HCT 29.2 (L) 01/10/2021   MCV 80.2 01/10/2021   PLT 181 01/10/2021     STUDIES: CT ABDOMEN PELVIS WO CONTRAST  Result Date: 01/09/2021 CLINICAL DATA:  45 year old female with increasing abdominal pain. Distal colitis on CT several days ago. Cervical cancer. Positive labs for for C difficile on 01/05/2021. EXAM: CT ABDOMEN AND PELVIS WITHOUT CONTRAST TECHNIQUE: Multidetector CT imaging of the abdomen and pelvis was performed following the standard protocol without IV contrast. COMPARISON:  CT Abdomen and Pelvis 01/06/2021. FINDINGS: Lower chest: Negative. Hepatobiliary: Absent gallbladder. Negative noncontrast liver aside from trace inferior perihepatic free fluid with simple fluid density on series 2, image 43. Pancreas: Negative noncontrast pancreas. Spleen: Negative. Adrenals/Urinary Tract: Normal adrenal glands. Noncontrast kidneys appear stable and nonobstructed. Bladder seems to remain normal. Incidental pelvic phleboliths. Stomach/Bowel: Previous Roux-en-Y type gastric bypass. No dilated small or large bowel loops, but distal small bowel in the anterior pelvis appears severely inflamed (series 2, image 67) with pronounced wall thickening bowel wall thickening and edema continuing to the terminal ileum and ileocecal valve (image 59). Right: And transverse colon appear within normal limits. Splenic flexure within normal limits. Gradual transition in the distal descending colon 2 inflamed sigmoid and rectum with circumferential wall thickening. Oval roughly 10 mm capsule appears impacted at the gastroesophageal junction on series 2, image 13, but there is no upstream esophageal  dilatation evident. Bypassed portion of the stomach contains a small volume of fluid as before. Small volume of fluid also in the duodenum. No free air. There is trace free fluid in the abdomen. Vascular/Lymphatic: Mild Calcified aortic atherosclerosis. Normal caliber abdominal aorta. Vascular patency is not evaluated in the absence of IV contrast. Reproductive: Retroverted uterus as before. Otherwise negative noncontrast appearance. Other: Pelvic mesenteric edema but no definite pelvic free fluid. Musculoskeletal: No acute osseous abnormality identified. IMPRESSION: 1. Progressive and now severe  inflammation of distal small bowel in the pelvis, including the terminal ileum. And ongoing distal colitis. Suspect Progressive Clostridium Difficile Enterocolitis in this setting. No pneumoperitoneum or pneumatosis identified. Trace free fluid. 2. Suspect impacted tablet or capsule at the GEJ. But no evidence of associated esophageal obstruction. 3. No other acute or inflammatory process identified on noncontrast CT abdomen and pelvis. Electronically Signed   By: Genevie Ann M.D.   On: 01/09/2021 10:54   CT ABDOMEN PELVIS W CONTRAST  Result Date: 01/06/2021 CLINICAL DATA:  Abdominal cramping and diarrhea. EXAM: CT ABDOMEN AND PELVIS WITH CONTRAST TECHNIQUE: Multidetector CT imaging of the abdomen and pelvis was performed using the standard protocol following bolus administration of intravenous contrast. CONTRAST:  45mL OMNIPAQUE IOHEXOL 300 MG/ML  SOLN COMPARISON:  None. FINDINGS: Lower chest: No acute abnormality. Hepatobiliary: No focal liver abnormality is seen. Status post cholecystectomy. Common bile duct is dilated and measures 1.4 cm. Pancreas: Unremarkable. No pancreatic ductal dilatation or surrounding inflammatory changes. Spleen: Normal in size without focal abnormality. Adrenals/Urinary Tract: Adrenal glands are unremarkable. Kidneys are normal, without renal calculi, focal lesion, or hydronephrosis. The  urinary bladder is partially empty and subsequently limited in evaluation. Stomach/Bowel: Surgical sutures are seen within the gastric region. Appendix appears normal. No evidence of bowel dilatation. Mild to moderate severity diffuse colonic wall thickening is seen throughout the descending and sigmoid colon. Vascular/Lymphatic: Very mild aortic atherosclerosis. No enlarged abdominal or pelvic lymph nodes. Reproductive: The uterus is unremarkable. A 2.1 cm diameter cyst is seen along the posterior aspect of the right adnexa. Other: No abdominal wall hernia or abnormality. No abdominopelvic ascites. Musculoskeletal: No acute or significant osseous findings. IMPRESSION: 1. Mild to moderate severity infectious or inflammatory colitis involving the descending and sigmoid colon. 2. Evidence of prior cholecystectomy. 3. 2.1 cm diameter right adnexal cyst, likely ovarian in origin. 4. Very mild aortic atherosclerosis. Aortic Atherosclerosis (ICD10-I70.0). Electronically Signed   By: Virgina Norfolk M.D.   On: 01/06/2021 19:52   PERIPHERAL VASCULAR CATHETERIZATION  Result Date: 12/20/2020 See surgical note for result.  DG Abd 2 Views  Result Date: 01/05/2021 CLINICAL DATA:  45 year old female with abdominal pain. Recent diarrhea and constipation. Cervical cancer EXAM: ABDOMEN - 2 VIEW COMPARISON:  PET-CT 12/06/2020. FINDINGS: Upright and supine views of the abdomen and pelvis. Negative lung bases. No pneumoperitoneum. Stable cholecystectomy clips. Non obstructed bowel gas pattern. Moderate volume of retained stool in the colon. Recent PET-CT with Roux-en-Y type gastric bypass. No acute osseous abnormality identified. Pelvic phleboliths. IMPRESSION: Nonobstructed bowel-gas pattern with moderate volume of retained stool. Prior gastric bypass. Electronically Signed   By: Genevie Ann M.D.   On: 01/05/2021 11:24    ASSESSMENT: Stage IIb adenocarcinoma of the cervix.  PLAN:    Stage IIb adenocarcinoma of the cervix:  By report patient is not a surgical candidate, therefore will proceed with concurrent XRT along with weekly chemotherapy using carboplatinum and Taxol.  She also will benefit from adjuvant brachytherapy.  PET scan results from December 06, 2020 reviewed independently and reported as above with no obvious evidence of malignancy outside of patient's known cervical cancer.  Proceed with cycle 2 of weekly carboplatin and Taxol today.  Continue daily XRT.  Return to clinic in 1 week for further evaluation and consideration of cycle 3.   Anemia: Chronic and unchanged.  Patient's hemoglobin has trended down slightly to 10.5. Thrombocytopenia: Resolved. Pelvic pain: Patient given a prescription for Vicodin today.  She has also signed a pain contract.  Patient expressed understanding and was in agreement with this plan. She also understands that She can call clinic at any time with any questions, concerns, or complaints.    Cancer Staging  Cervical cancer, FIGO stage IIB ([place]) Staging form: Cervix Uteri, AJCC Version 9 - Clinical stage from 12/06/2020: FIGO Stage IIB (cT2b, cN0, cM0) - Signed by Lloyd Huger, MD on 12/06/2020 Stage prefix: Initial diagnosis  Lloyd Huger, MD   01/10/2021 8:32 AM

## 2021-01-10 NOTE — Progress Notes (Signed)
PROGRESS NOTE    Vanessa Romero  WUJ:[id number] DOB: Nov 05, 1975 DOA: 01/07/2021 PCP: Anselmo Pickler, MD    Chief Complaint  Patient presents with   Abdominal Pain    Brief Narrative:   Vanessa Romero is a 45 y.o. female with medical history significant for cervical carcinoma currently undergoing chemotherapy and radiotherapy, gastric bypass, intracranial hypertension who presents to the emergency department due to about 5-day onset of abdominal pain, nausea, vomiting and diarrhea.  Patient tested positive for C. difficile with no C. difficile toxin per patient's oncologist medical record, but they wanted patient to be treated for an active infection.  Abdominal pain was sharp and burning and was rated as 7-8/10 on pain scale, it was intermittent and there was no alleviating/aggravating factor.  Patient states that she has not been able to tolerate any oral intake due to vomiting. In the emergency department, she was hemodynamically stable.  Work-up in the ED showed leukopenia, normocytic anemia, hypoglycemia, troponin x1 was negative, urinalysis was negative.  Influenza A, B, SARS coronavirus 2 was negative.  Full GI panel done by oncology was negative except for C. difficile without the C. difficile toxin CT abdomen and pelvis with contrast showed mild to moderate severity infectious or inflammatory colitis involving the descending and sigmoid colon Patient was started on vancomycin and symptomatic management with IV fluids and pain control.  pRN reported this morning that pt is requiring IV dilaudid every 2 hours . Pt reports that she is nauseated and has more abdominal pain in the right quadrant.  Repeat CT abdomen done , showed Progressive and now severe inflammation of distal small bowel in the pelvis, including the terminal ileum. And ongoing distal colitis. Suspect Progressive Clostridium Difficile Enterocolitis in this setting. No pneumoperitoneum or pneumatosis  identified.  GI consulted for further evaluation.    Assessment & Plan:   Principal Problem:   Colitis due to Clostridioides difficile Active Problems:   Cervical cancer, FIGO stage IIB (HCC)   Leukopenia due to antineoplastic chemotherapy (HCC)   Abdominal pain   Nausea & vomiting   Malnutrition of moderate degree  Progressive and severe C diff enterocolitis:  Increase the oral vancomycin to higher dose of 500 mg every 6 hours , add IV flagyl to the regimen.  Symptomatic management with IV fluids, IV pain control ( added fentanyl patch 68mcg in addition to hydrocodone and IV Dilaudid) , changed to IV phenergan . GI consulted for further recommendations as she reports persistent abdominal pain despite 3 pain meds and xanax.  Pain management consult from palliative care requested.  No signs of peritonitis on exam today.  Clear liquid diet for now.  Dicussed with the RN.     Leukopenia probably secondary to antineoplastic chemotherapy Improving.    History of cervical cancer last chemotherapy done on Wednesday.  Concurrent radiation treatment.  S/p 3 rd carbo/Taxol.    Mild hyponatremia:  Asymptomatic, monitor.    Mild anemia of chronic disease Baseline hemoglobin between 10 to 11. Dropped to 9.5  and has been stable around 9.4. Continue to monitor Transfuse to keep hemoglobin greater than 7.    Moderate malnutrition due to chronic Illness:  Dietary on board.   DVT prophylaxis: (Lovenox) Code Status: (Full code) Family Communication: none at bedside.  Disposition:   Status is: Inpatient  Remains inpatient appropriate because: IV pain control.        Consultants:  GI Palliative care for pain management .    Procedures: none.  Antimicrobials:  Antibiotics Given (last 72 hours)     Date/Time Action Medication Dose Rate   01/07/21 1855 Given   vancomycin (VANCOCIN) capsule 125 mg 125 mg    01/07/21 2211 Given   vancomycin (VANCOCIN) capsule 125 mg  125 mg    01/08/21 0937 Given   vancomycin (VANCOCIN) capsule 125 mg 125 mg    01/08/21 1418 Given   vancomycin (VANCOCIN) capsule 125 mg 125 mg    01/08/21 1833 Given   vancomycin (VANCOCIN) capsule 125 mg 125 mg    01/08/21 2236 Given   vancomycin (VANCOCIN) capsule 125 mg 125 mg    01/09/21 1057 Given   vancomycin (VANCOCIN) capsule 125 mg 125 mg    01/09/21 1346 Given   vancomycin (VANCOCIN) capsule 500 mg 500 mg    01/09/21 1356 New Bag/Given   metroNIDAZOLE (FLAGYL) IVPB 500 mg 500 mg 100 mL/hr   01/09/21 2126 Given   vancomycin (VANCOCIN) capsule 500 mg 500 mg    01/09/21 2133 New Bag/Given   metroNIDAZOLE (FLAGYL) IVPB 500 mg 500 mg 100 mL/hr   01/10/21 0443 New Bag/Given   metroNIDAZOLE (FLAGYL) IVPB 500 mg 500 mg 100 mL/hr   01/10/21 1035 Given   vancomycin (VANCOCIN) capsule 500 mg 500 mg    01/10/21 1403 Given   vancomycin (VANCOCIN) capsule 500 mg 500 mg    01/10/21 1407 New Bag/Given   metroNIDAZOLE (FLAGYL) IVPB 500 mg 500 mg 100 mL/hr         Subjective: Pt reports the fentanyl didn't make much different, she is more nauseated today.   Objective: Vitals:   01/09/21 2027 01/10/21 0429 01/10/21 0804 01/10/21 1547  BP: 106/67 116/75 112/66   Pulse: 81 70 72   Resp: 16 18 16    Temp: 98 F (36.7 C) 98.3 F (36.8 C) 97.6 F (36.4 C)   TempSrc:      SpO2: 99% 99% 100%   Weight:    65.1 kg  Height:        Intake/Output Summary (Last 24 hours) at 01/10/2021 1552 Last data filed at 01/09/2021 2221 Gross per 24 hour  Intake 1273.6 ml  Output --  Net 1273.6 ml    Filed Weights   01/07/21 1238 01/08/21 2104 01/10/21 1547  Weight: 54.4 kg 59.2 kg 65.1 kg    Examination:  General exam: Appears calm and comfortable  Respiratory system: Clear to auscultation. Respiratory effort normal. Cardiovascular system: S1 & S2 heard, RRR. No JVD,No pedal edema. Gastrointestinal system: Abdomen is soft, tenderness generalized, no distention. Normal bowel  sounds heard. Central nervous system: Alert and oriented. No focal neurological deficits. Extremities: Symmetric 5 x 5 power. Skin: No rashes, lesions or ulcers Psychiatry: anxious.       Data Reviewed: I have personally reviewed following labs and imaging studies  CBC: Recent Labs  Lab 01/04/21 0939 01/06/21 1323 01/07/21 1240 01/08/21 0658 01/09/21 1145 01/10/21 0455  WBC 3.5* 3.8* 3.3* 2.4* 2.0*   2.0* 2.5*  NEUTROABS 2.2  --   --   --  1.1*  --   HGB 10.4* 10.7* 11.1* 11.0* 9.3*   9.5* 9.4*  HCT 33.3* 34.0* 35.7* 34.8* 29.3*   29.5* 29.2*  MCV 79.9* 78.9* 81.0 81.3 80.1   79.7* 80.2  PLT 245 243 260 226 209   210 181     Basic Metabolic Panel: Recent Labs  Lab 01/06/21 1323 01/07/21 1240 01/08/21 0658 01/09/21 1145 01/10/21 0455  NA 134* 139 139 135 133*  K 4.1 4.0 4.0 4.1 3.5  CL 108 108 112* 108 107  CO2 21* 25 22 24 23   GLUCOSE 107* 63* 74 90 102*  BUN 13 14 16 13 10   CREATININE 0.61 0.69 0.53 0.56 0.53  CALCIUM 8.2* 8.9 7.8* 8.0* 7.8*  MG  --   --  1.8  --   --   PHOS  --   --  4.6  --   --      GFR: Estimated Creatinine Clearance: 87.3 mL/min (by C-G formula based on SCr of 0.53 mg/dL).  Liver Function Tests: Recent Labs  Lab 01/04/21 0939 01/06/21 1323 01/07/21 1240 01/08/21 0658  AST 20 26 20 17   ALT 13 17 15 13   ALKPHOS 40 38 41 35*  BILITOT 0.2* 0.4 0.4 0.4  PROT 5.8* 5.8* 5.9* 5.0*  ALBUMIN 3.2* 3.2* 3.3* 2.7*     CBG: Recent Labs  Lab 01/09/21 1213 01/09/21 1702 01/10/21 0425 01/10/21 0759 01/10/21 1203  GLUCAP 82 103* 109* 83 100*      Recent Results (from the past 240 hour(s))  C difficile quick screen w PCR reflex     Status: Abnormal   Collection Time: 01/04/21  1:58 PM   Specimen: STOOL  Result Value Ref Range Status   C Diff antigen POSITIVE (A) NEGATIVE Final   C Diff toxin NEGATIVE NEGATIVE Final   C Diff interpretation Results are indeterminate. See PCR results.  Final    Comment: Performed at [facility], [address] [place], [place] [address]  Gastrointestinal Panel by PCR , Stool     Status: None   Collection Time: 01/04/21  1:58 PM   Specimen: Stool  Result Value Ref Range Status   Campylobacter species NOT DETECTED NOT DETECTED Final   Plesimonas shigelloides NOT DETECTED NOT DETECTED Final   Salmonella species NOT DETECTED NOT DETECTED Final   Yersinia enterocolitica NOT DETECTED NOT DETECTED Final   Vibrio species NOT DETECTED NOT DETECTED Final   Vibrio cholerae NOT DETECTED NOT DETECTED Final   Enteroaggregative E coli (EAEC) NOT DETECTED NOT DETECTED Final   Enteropathogenic E coli (EPEC) NOT DETECTED NOT DETECTED Final   Enterotoxigenic E coli (ETEC) NOT DETECTED NOT DETECTED Final   Shiga like toxin producing E coli (STEC) NOT DETECTED NOT DETECTED Final   Shigella/Enteroinvasive E coli (EIEC) NOT DETECTED NOT DETECTED Final   Cryptosporidium NOT DETECTED NOT DETECTED Final   Cyclospora cayetanensis NOT DETECTED NOT DETECTED Final   Entamoeba histolytica NOT DETECTED NOT DETECTED Final   Giardia lamblia NOT DETECTED NOT DETECTED Final   Adenovirus F40/41 NOT DETECTED NOT DETECTED Final   Astrovirus NOT DETECTED NOT DETECTED Final   Norovirus GI/GII NOT DETECTED NOT DETECTED Final   Rotavirus A NOT DETECTED NOT DETECTED Final   Sapovirus (I, II, IV, and V) NOT DETECTED NOT DETECTED Final    Comment: Performed at [facility], [address] [place], [place] [address]  C. Diff by PCR, Reflexed     Status: Abnormal   Collection Time: 01/04/21  1:58 PM  Result Value Ref Range Status   Toxigenic C. Difficile by PCR POSITIVE (A) NEGATIVE Final    Comment: Positive for toxigenic C. difficile with little to no toxin production. Only treat if clinical presentation suggests symptomatic illness. Performed at [facility], [address] [place], [place] [address]   Resp Panel by RT-PCR (Flu A&B, Covid) Nasopharyngeal Swab     Status: None  Collection Time: 01/06/21  2:38 PM   Specimen: Nasopharyngeal Swab; Nasopharyngeal(NP) swabs in vial transport medium  Result Value Ref Range Status   SARS Coronavirus 2 by RT PCR NEGATIVE NEGATIVE Final    Comment: (NOTE) SARS-CoV-2 target nucleic acids are NOT DETECTED.  The SARS-CoV-2 RNA is generally detectable in upper respiratory specimens during the acute phase of infection. The lowest concentration of SARS-CoV-2 viral copies this assay can detect is 138 copies/mL. A negative result does not preclude SARS-Cov-2 infection and should not be used as the sole basis for treatment or other patient management decisions. A negative result may occur with  improper specimen collection/handling, submission of specimen other than nasopharyngeal swab, presence of viral mutation(s) within the areas targeted by this assay, and inadequate number of viral copies(<138 copies/mL). A negative result must be combined with clinical observations, patient history, and epidemiological information. The expected result is Negative.  Fact Sheet for Patients:  EntrepreneurPulse.com.au  Fact Sheet for Healthcare Providers:  IncredibleEmployment.be  This test is no t yet approved or cleared by the [place] FDA and  has been authorized for detection and/or diagnosis of SARS-CoV-2 by FDA under an Emergency Use Authorization (EUA). This EUA will remain  in effect (meaning this test can be used) for the duration of the COVID-19 declaration under Section 564(b)(1) of the Act, 21 U.S.C.section 360bbb-3(b)(1), unless the authorization is terminated  or revoked sooner.       Influenza A by PCR NEGATIVE NEGATIVE Final   Influenza B by PCR NEGATIVE NEGATIVE Final    Comment: (NOTE) The Xpert Xpress SARS-CoV-2/FLU/RSV plus assay is intended as an aid in the diagnosis of influenza from Nasopharyngeal swab specimens and should not be used as a sole basis for treatment.  Nasal washings and aspirates are unacceptable for Xpert Xpress SARS-CoV-2/FLU/RSV testing.  Fact Sheet for Patients: EntrepreneurPulse.com.au  Fact Sheet for Healthcare Providers: IncredibleEmployment.be  This test is not yet approved or cleared by the [place] FDA and has been authorized for detection and/or diagnosis of SARS-CoV-2 by FDA under an Emergency Use Authorization (EUA). This EUA will remain in effect (meaning this test can be used) for the duration of the COVID-19 declaration under Section 564(b)(1) of the Act, 21 U.S.C. section 360bbb-3(b)(1), unless the authorization is terminated or revoked.  Performed at [facility], [address], [place], [place] [address]           Radiology Studies: CT ABDOMEN PELVIS WO CONTRAST  Result Date: 01/09/2021 CLINICAL DATA:  45 year old female with increasing abdominal pain. Distal colitis on CT several days ago. Cervical cancer. Positive labs for for C difficile on 01/05/2021. EXAM: CT ABDOMEN AND PELVIS WITHOUT CONTRAST TECHNIQUE: Multidetector CT imaging of the abdomen and pelvis was performed following the standard protocol without IV contrast. COMPARISON:  CT Abdomen and Pelvis 01/06/2021. FINDINGS: Lower chest: Negative. Hepatobiliary: Absent gallbladder. Negative noncontrast liver aside from trace inferior perihepatic free fluid with simple fluid density on series 2, image 43. Pancreas: Negative noncontrast pancreas. Spleen: Negative. Adrenals/Urinary Tract: Normal adrenal glands. Noncontrast kidneys appear stable and nonobstructed. Bladder seems to remain normal. Incidental pelvic phleboliths. Stomach/Bowel: Previous Roux-en-Y type gastric bypass. No dilated small or large bowel loops, but distal small bowel in the anterior pelvis appears severely inflamed (series 2, image 67) with pronounced wall thickening bowel wall thickening and edema continuing to the terminal ileum and  ileocecal valve (image 59). Right: And transverse colon appear within normal limits. Splenic flexure within normal limits. Gradual transition in  the distal descending colon 2 inflamed sigmoid and rectum with circumferential wall thickening. Oval roughly 10 mm capsule appears impacted at the gastroesophageal junction on series 2, image 13, but there is no upstream esophageal dilatation evident. Bypassed portion of the stomach contains a small volume of fluid as before. Small volume of fluid also in the duodenum. No free air. There is trace free fluid in the abdomen. Vascular/Lymphatic: Mild Calcified aortic atherosclerosis. Normal caliber abdominal aorta. Vascular patency is not evaluated in the absence of IV contrast. Reproductive: Retroverted uterus as before. Otherwise negative noncontrast appearance. Other: Pelvic mesenteric edema but no definite pelvic free fluid. Musculoskeletal: No acute osseous abnormality identified. IMPRESSION: 1. Progressive and now severe inflammation of distal small bowel in the pelvis, including the terminal ileum. And ongoing distal colitis. Suspect Progressive Clostridium Difficile Enterocolitis in this setting. No pneumoperitoneum or pneumatosis identified. Trace free fluid. 2. Suspect impacted tablet or capsule at the GEJ. But no evidence of associated esophageal obstruction. 3. No other acute or inflammatory process identified on noncontrast CT abdomen and pelvis. Electronically Signed   By: Genevie Ann M.D.   On: 01/09/2021 10:54        Scheduled Meds:  Chlorhexidine Gluconate Cloth  6 each Topical Daily   [START ON 01/11/2021] cholecalciferol  1,000 Units Oral Daily   enoxaparin (LOVENOX) injection  40 mg Subcutaneous Q24H   escitalopram  20 mg Oral Q1200   feeding supplement (KATE [employer]ARMS STANDARD 1.4)  325 mL Oral TID   fentaNYL  1 patch Transdermal D22G   folic acid  1 mg Oral Daily   lamoTRIgine  50 mg Oral Daily   [START ON 01/11/2021] multivitamin with minerals  1  tablet Oral Daily   pantoprazole (PROTONIX) IV  40 mg Intravenous Q24H   sodium chloride flush  10-40 mL Intracatheter Q12H   sucralfate  1 g Oral TID PC   vancomycin  500 mg Oral QID   cyanocobalamin  1,000 mcg Oral Daily   Continuous Infusions:  sodium chloride 75 mL/hr at 01/10/21 0443   metronidazole 500 mg (01/10/21 1407)     LOS: 3 days        Hosie Poisson, MD [facility]   To contact the attending provider between 7A-7P or the covering provider during after hours 7P-7A, please log into the web site [email or web address] and access using universal [place] password for that web site. If you do not have the password, please call the hospital operator.  01/10/2021, 3:52 PM

## 2021-01-10 NOTE — Progress Notes (Signed)
Initial Nutrition Assessment  DOCUMENTATION CODES:   Non-severe (moderate) malnutrition in context of social or environmental circumstances  INTERVENTION:   [employer] [place] Standard 1.0 (chocolate)- provide supplement three times daily po- provides 325kcal and 16g protein per serving.   MVI po daily   Cholecalciferol 1000 units po daily   Pt at high refeed risk; recommend monitor potassium, magnesium and phosphorus labs daily until stable  NUTRITION DIAGNOSIS:   Moderate Malnutrition related to social / environmental circumstances as evidenced by moderate fat depletion, moderate muscle depletion.  GOAL:   Patient will meet greater than or equal to 90% of their needs  MONITOR:   PO intake, Supplement acceptance, Labs, Weight trends, Skin, I & O's  REASON FOR ASSESSMENT:   Malnutrition Screening Tool    ASSESSMENT:   45 y/o female with h/o etoh abuse, chronic diarrhea, roux-en-y gatric bypass 2010, LVH, intracranial hypertension since 2007 status post 2 VP shunt placements last of which was removed in May 2012 secondary to meningitis, chronic headaches, seizures, depression and recently diagnosed cervical carcinoma currently undergoing chemotherapy and radiotherapy who is now admitted with C. diff colitis.  Met with pt in room today. Pt is well known to this RD from her recent previous admit. Pt has started on chemotherapy/XRT since she was last seen by this RD. Pt is now diagnosed with C. Diff. Pt reports decreased appetite and oral intake for the past weeks r/t diarrhea and abdominal pain. Pt reports that she has been sober since her last admission and reports that she has been taking her vitamins and protein supplements at home. Pt reports that she has been drinking "Evolve" a plant based protein supplement at home. Pt reports that she has been unable to tolerate any dairy based products as they worsen her diarrhea. RD discussed with pt the importance of adequate nutrition needed to  preserve lean muscle. Pt would like to try [facility] in hospital as this is plant based. Pt currently on a clear liquid diet; MD is ok for pt to get the [facility] on a clear liquid diet. RD will add supplements and MVI to help pt meet her estimated needs. Pt is likely at refeed risk. Pt with a h/o roux-en-y gastric bypass. Pt's vitamins labs were checked during her last admission with everything being within normal limits with exception of her vitamin D. Pt reports that she has been taking vitamin D and B12 supplements at home. Recommended for pt to follow with the RD at the cancer center. Per chart, pt appears weight stable since her last admission.   Medications reviewed and include: lovenox, folic acid, protonix, carafate, vancomycin, B12, metronidazole, NaCl '@75ml' /hr  Labs reviewed: Na 133(L) P 4.6 wnl, Mg 1.8 wnl- 12/24 Wbc- 2.5(L), Hgb 9.4(L), Hct 29.2(L) Cbgs- 100, 83, 109 x 24 hrs  NUTRITION - FOCUSED PHYSICAL EXAM:  Flowsheet Row Most Recent Value  Orbital Region Mild depletion  Upper Arm Region Moderate depletion  Thoracic and Lumbar Region Moderate depletion  Buccal Region Mild depletion  Temple Region Moderate depletion  Clavicle Bone Region Moderate depletion  Clavicle and Acromion Bone Region Moderate depletion  Scapular Bone Region Moderate depletion  Dorsal Hand Moderate depletion  Patellar Region Severe depletion  Anterior Thigh Region Severe depletion  Posterior Calf Region Severe depletion  Edema (RD Assessment) None  Hair Reviewed  Eyes Reviewed  Mouth Reviewed  Skin Reviewed  Nails Reviewed   Diet Order:   Diet Order  Diet clear liquid Room service appropriate? Yes; Fluid consistency: Thin  Diet effective now                  EDUCATION NEEDS:   Education needs have been addressed  Skin:  Skin Assessment: Reviewed RN Assessment  Last BM:  12/26- type 7  Height:   Ht Readings from Last 1 Encounters:  01/08/21 5' 7.01" (1.702 m)     Weight:   Wt Readings from Last 1 Encounters:  01/10/21 65.1 kg    Ideal Body Weight:  61.36 kg  BMI:  Body mass index is 22.47 kg/m.  Estimated Nutritional Needs:   Kcal:  1800-2100kcal/day  Protein:  90-105g/day  Fluid:  1.8-2.1L/day  Koleen Distance MS, RD, LDN Please refer to [facility] for RD and/or RD on-call/weekend/after hours pager

## 2021-01-10 NOTE — Progress Notes (Signed)
Repeated episodes of hypoglycemia last CBG noted to be 60.  Switched IV fluids to D10 at 75 mL/h in the interim.  Continue CBGs every 4 hours.  Consider further work-up in a.m. if symptoms persist.

## 2021-01-10 NOTE — Consult Note (Signed)
Vanessa Antigua, MD [address] [place], [place], [place], [address] [address], [place], [place], [place], [address] Phone: [phone]  Fax: [phone]  Consultation  Referring Provider:     Dr. Karleen Hampshire Primary Care Physician:  Anselmo Pickler, MD Reason for Consultation:    C. difficile diarrhea  Date of Admission:  01/07/2021 Date of Consultation:  01/10/2021         HPI:   Vanessa Romero is a 45 y.o. female with recent diagnosis of cervical cancer, started chemotherapy and radiation about 4 weeks ago and reports diarrhea for the last week.  Reports multiple loose bowel movements a day, that she describes as over 20.  Most recent oncology note from 01/13/2019 reviewed and reports that patient has chemo induced anemia, neutropenia and had reported abdominal pain and diarrhea at that visit as well.  They thought the diarrhea was secondary to XRT and recommended Imodium and Lomotil.  However, they obtained stool test that showed positive C. difficile antigen, and due to her ongoing symptoms treatment was recommended.  However, patient did not start treatment until 12/23 when she was seen in the ER.  She was started on vancomycin orally but she feels that it did not help her symptoms  Patient was admitted in November 2022 due to alcohol withdrawal and was noted to have anemia.  As per previous consult note from November 2022:  "Patient reports history of previous GI ulcers.  States she may have noted black stool a month ago.  Describes daily NSAID use as well.  Describes having a previous upper endoscopy.  Care everywhere GI notes reviewed and patient had seen Dr. Merrilee Jansky of [facility] on June 16, 2020 and patient has a history of Roux-en-Y gastric bypass in 2011.  This note states that patient had a colonoscopy 2 years ago by Dr. Lorane Gell in 2020.  Procedure report not available.  Due to abdominal pain patient was scheduled for upper endoscopy which was done on June 22.   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  At least 4 erosions at the anastomosis.  Visible staple at the anastomosis.  Small bowel reported to be normal.  Protonix, sucralfate and Questran was recommended.  Colonoscopy was recommended but has not been done yet.  Patient has also previously seen hematology for iron deficiency anemia."  EGD on that admission showed Roux-en-Y gastrojejunostomy with healthy-appearing mucosa at the anastomosis site  Past Medical History:  Diagnosis Date   Alcohol abuse    Anemia    Cancer ([place])    Tobacco dependence     Past Surgical History:  Procedure Laterality Date   ABDOMINAL ADHESION SURGERY     bowel obstruction     x2   CERVICAL CONIZATION W/BX N/A 11/26/2020   Procedure: CONIZATION CERVIX WITH BIOPSY;  Surgeon: Malachy Mood, MD;  Location: [facility] ORS;  Service: Gynecology;  Laterality: N/A;   ESOPHAGOGASTRODUODENOSCOPY N/A 11/24/2020   Procedure: ESOPHAGOGASTRODUODENOSCOPY (EGD);  Surgeon: Lin Landsman, MD;  Location: [facility];  Service: Gastroenterology;  Laterality: N/A;   laparoscopic knee surgery     PORTA CATH INSERTION N/A 12/20/2020   Procedure: PORTA CATH INSERTION;  Surgeon: Algernon Huxley, MD;  Location: [facility];  Service: Cardiovascular;  Laterality: N/A;   ROUX-EN-Y GASTRIC BYPASS     TEAR DUCT PROBING     unclogg   VAGOTOMY     VENTRICULOPERITONEAL SHUNT     x6 put in  and removals    Prior to Admission medications   Medication Sig Start Date End Date Taking? Authorizing Provider  cholecalciferol (VITAMIN D) 25 MCG tablet Take 1 tablet (1,000 Units total) by mouth daily. 11/27/20  Yes Wieting, Richard, MD  cyanocobalamin 1000 MCG tablet Take 1 tablet (1,000 mcg total) by mouth daily. 01/04/21  Yes Jacquelin Hawking, NP  dicyclomine (BENTYL) 10 MG capsule Take 1 capsule (10 mg total) by mouth 3 (three) times daily as needed (abd pain). 01/06/21  Yes Nance Pear, MD   escitalopram (LEXAPRO) 20 MG tablet Take 1 tablet (20 mg total) by mouth daily at 12 noon. 11/27/20  Yes Wieting, Richard, MD  feeding supplement (ENSURE ENLIVE / ENSURE PLUS) LIQD Take 237 mLs by mouth 3 (three) times daily between meals. 11/27/20  Yes Wieting, Richard, MD  folic acid (FOLVITE) 1 MG tablet Take 1 tablet (1 mg total) by mouth daily. 01/04/21  Yes Jacquelin Hawking, NP  HYDROcodone-acetaminophen (NORCO/VICODIN) 5-325 MG tablet Take 1 tablet by mouth every 6 (six) hours as needed for moderate pain. Patient taking differently: Take 1 tablet by mouth every 4 (four) hours as needed for moderate pain. 01/04/21  Yes Jacquelin Hawking, NP  lamoTRIgine (LAMICTAL) 25 MG tablet Take 2 tablets (50 mg total) by mouth daily. 11/27/20  Yes Wieting, Richard, MD  Multiple Vitamin (MULTIVITAMIN WITH MINERALS) TABS tablet Take 1 tablet by mouth daily. 11/27/20  Yes Wieting, Richard, MD  sucralfate (CARAFATE) 1 g tablet Take 1 tablet (1 g total) by mouth 3 (three) times daily. 01/04/21  Yes Chrystal, Eulas Post, MD  thiamine 100 MG tablet Take 1 tablet (100 mg total) by mouth daily. 11/27/20  Yes Wieting, Delfino Lovett, MD  traMADol (ULTRAM) 50 MG tablet Take 1 tablet (50 mg total) by mouth every 6 (six) hours as needed. 01/04/21  Yes Jacquelin Hawking, NP  vancomycin (VANCOCIN) 125 MG capsule Take 1 capsule (125 mg total) by mouth 4 (four) times daily. 01/05/21  Yes Jacquelin Hawking, NP  lidocaine-prilocaine (EMLA) cream Apply to affected area once 12/07/20   Lloyd Huger, MD  ondansetron (ZOFRAN) 8 MG tablet Take 1 tablet (8 mg total) by mouth 2 (two) times daily as needed for refractory nausea / vomiting. 12/07/20   Lloyd Huger, MD  prochlorperazine (COMPAZINE) 10 MG tablet Take 1 tablet (10 mg total) by mouth every 6 (six) hours as needed (Nausea or vomiting). 12/07/20   Lloyd Huger, MD    Family History  Problem Relation Age of Onset   Cancer Mother    Cancer Father    Cancer  Maternal Grandmother      Social History   Tobacco Use   Smoking status: Former    Types: Cigarettes   Smokeless tobacco: Former  Scientific laboratory technician Use: Former  Substance Use Topics   Alcohol use: Not Currently   Drug use: Not Currently    Allergies as of 01/07/2021 - Review Complete 01/07/2021  Allergen Reaction Noted   Contrast media [iodinated contrast media] Hives 10/03/2012   Gabapentin Other (See Comments), Rash, and Palpitations 08/03/2014   Morphine Dermatitis, Hives, Rash, Swelling, and Other (See Comments) 10/03/2012   Sumatriptan Dermatitis, Hives, Itching, Other (See Comments), and Swelling 05/24/2011   Zolpidem Nausea And Vomiting and Other (See Comments) 10/03/2012   Erythromycin Diarrhea, Nausea And Vomiting, and Nausea Only 10/03/2012   Valproic acid Rash 05/24/2011   Acetazolamide  03/25/2013   Erythromycin base  09/02/2015  Amoxicillin Rash 05/24/2019   Divalproex sodium Anxiety and Other (See Comments) 12/01/2020    Review of Systems:    All systems reviewed and negative except where noted in HPI.   Physical Exam:  Constitutional: General:   Alert,  Well-developed, well-nourished, pleasant and cooperative in NAD BP 114/71 (BP Location: Right Arm)    Pulse 79    Temp 98.4 F (36.9 C)    Resp 20    Ht 5' 7.01" (1.702 m)    Wt 65.1 kg    LMP 01/04/2021    SpO2 100%    BMI 22.47 kg/m   Eyes:  Sclera clear, no icterus.   Conjunctiva pink. PERRLA  Ears:  No scars, lesions or masses, Normal auditory acuity. Nose:  No deformity, discharge, or lesions. Mouth:  No deformity or lesions, oropharynx pink & moist.  Neck:  Supple; no masses or thyromegaly.  Respiratory: Normal respiratory effort, Normal percussion  Gastrointestinal:  Normal bowel sounds.  No bruits.  Soft, tender to palpation right lower quadrant, and non-distended without masses, hepatosplenomegaly or hernias noted.  No guarding or rebound tenderness.     Cardiac: No clubbing or edema.  No  cyanosis. Normal posterior tibial pedal pulses noted.  Lymphatic:  No significant cervical or axillary adenopathy.  Psych:  Alert and cooperative. Normal mood and affect.  Musculoskeletal:  Normal gait. Head normocephalic, atraumatic. Symmetrical without gross deformities. 5/5 Upper and Lower extremity strength bilaterally.  Skin: Warm. Intact without significant lesions or rashes. No jaundice.  Neurologic:  Face symmetrical, tongue midline, Normal sensation to touch;  grossly normal neurologically.  Psych:  Alert and oriented x3, Alert and cooperative. Normal mood and affect.   LAB RESULTS: Recent Labs    01/08/21 0658 01/09/21 1145 01/10/21 0455  WBC 2.4* 2.0*   2.0* 2.5*  HGB 11.0* 9.3*   9.5* 9.4*  HCT 34.8* 29.3*   29.5* 29.2*  PLT 226 209   210 181   BMET Recent Labs    01/08/21 0658 01/09/21 1145 01/10/21 0455  NA 139 135 133*  K 4.0 4.1 3.5  CL 112* 108 107  CO2 22 24 23   GLUCOSE 74 90 102*  BUN 16 13 10   CREATININE 0.53 0.56 0.53  CALCIUM 7.8* 8.0* 7.8*   LFT Recent Labs    01/08/21 0658  PROT 5.0*  ALBUMIN 2.7*  AST 17  ALT 13  ALKPHOS 35*  BILITOT 0.4   PT/INR No results for input(s): LABPROT, INR in the last 72 hours.  STUDIES: CT ABDOMEN PELVIS WO CONTRAST  Result Date: 01/09/2021 CLINICAL DATA:  45 year old female with increasing abdominal pain. Distal colitis on CT several days ago. Cervical cancer. Positive labs for for C difficile on 01/05/2021. EXAM: CT ABDOMEN AND PELVIS WITHOUT CONTRAST TECHNIQUE: Multidetector CT imaging of the abdomen and pelvis was performed following the standard protocol without IV contrast. COMPARISON:  CT Abdomen and Pelvis 01/06/2021. FINDINGS: Lower chest: Negative. Hepatobiliary: Absent gallbladder. Negative noncontrast liver aside from trace inferior perihepatic free fluid with simple fluid density on series 2, image 43. Pancreas: Negative noncontrast pancreas. Spleen: Negative. Adrenals/Urinary Tract: Normal  adrenal glands. Noncontrast kidneys appear stable and nonobstructed. Bladder seems to remain normal. Incidental pelvic phleboliths. Stomach/Bowel: Previous Roux-en-Y type gastric bypass. No dilated small or large bowel loops, but distal small bowel in the anterior pelvis appears severely inflamed (series 2, image 67) with pronounced wall thickening bowel wall thickening and edema continuing to the terminal ileum and ileocecal valve (image 59). Right:  And transverse colon appear within normal limits. Splenic flexure within normal limits. Gradual transition in the distal descending colon 2 inflamed sigmoid and rectum with circumferential wall thickening. Oval roughly 10 mm capsule appears impacted at the gastroesophageal junction on series 2, image 13, but there is no upstream esophageal dilatation evident. Bypassed portion of the stomach contains a small volume of fluid as before. Small volume of fluid also in the duodenum. No free air. There is trace free fluid in the abdomen. Vascular/Lymphatic: Mild Calcified aortic atherosclerosis. Normal caliber abdominal aorta. Vascular patency is not evaluated in the absence of IV contrast. Reproductive: Retroverted uterus as before. Otherwise negative noncontrast appearance. Other: Pelvic mesenteric edema but no definite pelvic free fluid. Musculoskeletal: No acute osseous abnormality identified. IMPRESSION: 1. Progressive and now severe inflammation of distal small bowel in the pelvis, including the terminal ileum. And ongoing distal colitis. Suspect Progressive Clostridium Difficile Enterocolitis in this setting. No pneumoperitoneum or pneumatosis identified. Trace free fluid. 2. Suspect impacted tablet or capsule at the GEJ. But no evidence of associated esophageal obstruction. 3. No other acute or inflammatory process identified on noncontrast CT abdomen and pelvis. Electronically Signed   By: Genevie Ann M.D.   On: 01/09/2021 10:54      Impression / Plan:   Vanessa Romero is a 45 y.o. y/o female with cervical cancer, started chemo and radiation therapy 4 weeks ago, with diarrhea for the last week and C. difficile antigen positive, with CT showing inflammation in the distal small bowel, descending colon, sigmoid and rectum  Primary team has increased her vancomycin to 500 mg and added IV Flagyl, which is appropriate  Monitor symptoms closely.  Given small bowel inflammation seen on CT scan, pain primarily being in the right lower quadrant, likely from small bowel inflammation, this may not entirely be related to C. Difficile  Findings may be as a result of her XRT adverse effects.  Chemotherapy can also be causing her diarrhea  Management of diarrhea from XRT can be with Lomotil or Imodium.  However, would monitor to see if it gets better with the increase in her C. difficile meds and if it does not, literature does suggest that Imodium can be used in the setting of C. difficile as well  Recommend consulting oncology as well given symptoms may be related to her cervical cancer treatment and if this needs to be changed for her  If symptoms do not improve with conservative measures, flexible sigmoidoscopy can be considered for diagnostic purposes and biopsies  Above discussed with Dr. Karleen Hampshire  Thank you for involving me in the care of this patient.      LOS: 3 days   Virgel Manifold, MD  01/10/2021, 4:53 PM

## 2021-01-11 ENCOUNTER — Other Ambulatory Visit (HOSPITAL_COMMUNITY): Payer: Self-pay

## 2021-01-11 ENCOUNTER — Ambulatory Visit: Payer: Medicare Other

## 2021-01-11 ENCOUNTER — Inpatient Hospital Stay: Payer: Medicare Other

## 2021-01-11 ENCOUNTER — Inpatient Hospital Stay: Payer: Medicare Other | Admitting: Oncology

## 2021-01-11 ENCOUNTER — Telehealth: Payer: Self-pay | Admitting: Oncology

## 2021-01-11 DIAGNOSIS — A0472 Enterocolitis due to Clostridium difficile, not specified as recurrent: Principal | ICD-10-CM

## 2021-01-11 DIAGNOSIS — R109 Unspecified abdominal pain: Secondary | ICD-10-CM | POA: Diagnosis not present

## 2021-01-11 LAB — CBC WITH DIFFERENTIAL/PLATELET
Abs Immature Granulocytes: 0.01 10*3/uL (ref 0.00–0.07)
Basophils Absolute: 0 10*3/uL (ref 0.0–0.1)
Basophils Relative: 1 %
Eosinophils Absolute: 0.2 10*3/uL (ref 0.0–0.5)
Eosinophils Relative: 9 %
HCT: 30.3 % — ABNORMAL LOW (ref 36.0–46.0)
Hemoglobin: 9.8 g/dL — ABNORMAL LOW (ref 12.0–15.0)
Immature Granulocytes: 0 %
Lymphocytes Relative: 30 %
Lymphs Abs: 0.7 10*3/uL (ref 0.7–4.0)
MCH: 26.5 pg (ref 26.0–34.0)
MCHC: 32.3 g/dL (ref 30.0–36.0)
MCV: 81.9 fL (ref 80.0–100.0)
Monocytes Absolute: 0.3 10*3/uL (ref 0.1–1.0)
Monocytes Relative: 12 %
Neutro Abs: 1.1 10*3/uL — ABNORMAL LOW (ref 1.7–7.7)
Neutrophils Relative %: 48 %
Platelets: 188 10*3/uL (ref 150–400)
RBC: 3.7 MIL/uL — ABNORMAL LOW (ref 3.87–5.11)
Smear Review: NORMAL
WBC: 2.3 10*3/uL — ABNORMAL LOW (ref 4.0–10.5)
nRBC: 0 % (ref 0.0–0.2)

## 2021-01-11 LAB — GLUCOSE, CAPILLARY
Glucose-Capillary: 101 mg/dL — ABNORMAL HIGH (ref 70–99)
Glucose-Capillary: 155 mg/dL — ABNORMAL HIGH (ref 70–99)
Glucose-Capillary: 62 mg/dL — ABNORMAL LOW (ref 70–99)
Glucose-Capillary: 63 mg/dL — ABNORMAL LOW (ref 70–99)
Glucose-Capillary: 68 mg/dL — ABNORMAL LOW (ref 70–99)
Glucose-Capillary: 81 mg/dL (ref 70–99)
Glucose-Capillary: 90 mg/dL (ref 70–99)
Glucose-Capillary: 91 mg/dL (ref 70–99)
Glucose-Capillary: 94 mg/dL (ref 70–99)

## 2021-01-11 LAB — BASIC METABOLIC PANEL
Anion gap: 3 — ABNORMAL LOW (ref 5–15)
BUN: <5 mg/dL — ABNORMAL LOW (ref 6–20)
CO2: 25 mmol/L (ref 22–32)
Calcium: 8.2 mg/dL — ABNORMAL LOW (ref 8.9–10.3)
Chloride: 111 mmol/L (ref 98–111)
Creatinine, Ser: 0.46 mg/dL (ref 0.44–1.00)
GFR, Estimated: >60 mL/min (ref >60)
Glucose, Bld: 85 mg/dL (ref 70–99)
Potassium: 4.3 mmol/L (ref 3.5–5.1)
Sodium: 139 mmol/L (ref 135–145)

## 2021-01-11 NOTE — Progress Notes (Signed)
Vonda Antigua, MD [address] [place], [place], [place], [address] [address], [place], [place], [place], [address] Phone: [phone]  Fax: [phone]   Subjective:  Patient states she continues to have diarrhea.  No blood in stool.  Objective: Exam: Vital signs in last 24 hours: Vitals:   01/10/21 2026 01/11/21 0507 01/11/21 0752 01/11/21 1541  BP: 97/62 111/76 124/69 112/72  Pulse: 74 69 79 81  Resp: 16 16 14 16   Temp: 98.4 F (36.9 C) 98.5 F (36.9 C) 98.7 F (37.1 C) 98.4 F (36.9 C)  TempSrc:      SpO2: 100% 100% 100% 100%  Weight:      Height:       Weight change:   Intake/Output Summary (Last 24 hours) at 01/11/2021 1715 Last data filed at 01/11/2021 0900 Gross per 24 hour  Intake 600 ml  Output --  Net 600 ml    Constitutional: General:   Alert,  Well-developed, well-nourished, pleasant and cooperative in NAD BP 112/72 (BP Location: Left Arm)    Pulse 81    Temp 98.4 F (36.9 C)    Resp 16    Ht 5' 7.01" (1.702 m)    Wt 65.1 kg    LMP 01/04/2021    SpO2 100%    BMI 22.47 kg/m   Eyes:  Sclera clear, no icterus.   Conjunctiva pink.   Ears:  No scars, lesions or masses, Normal auditory acuity. Nose:  No deformity, discharge, or lesions. Mouth:  No deformity or lesions, oropharynx pink & moist.  Neck:  Supple; no masses, trachea midline  Respiratory: Normal respiratory effort  Gastrointestinal:  Soft, on-distended without masses, tender to palpation right lower quadrant, although less than yesterday.  No guarding or rebound tenderness.     Cardiac: No clubbing or edema.  No cyanosis. Normal posterior tibial pedal pulses noted.  Lymphatic:  No significant cervical adenopathy.  Psych:  Alert and cooperative. Normal mood and affect.  Musculoskeletal:  Head normocephalic, atraumatic. 5/5 Lower extremity strength bilaterally.  Skin: Warm. Intact without significant lesions or rashes. No jaundice.  Neurologic:  Face symmetrical,  tongue midline, Normal sensation to touch  Psych:  Alert and oriented x3, Alert and cooperative. Normal mood and affect.   Lab Results: Lab Results  Component Value Date   WBC 2.3 (L) 01/11/2021   HGB 9.8 (L) 01/11/2021   HCT 30.3 (L) 01/11/2021   MCV 81.9 01/11/2021   PLT 188 01/11/2021   Micro Results: Recent Results (from the past 240 hour(s))  C difficile quick screen w PCR reflex     Status: Abnormal   Collection Time: 01/04/21  1:58 PM   Specimen: STOOL  Result Value Ref Range Status   C Diff antigen POSITIVE (A) NEGATIVE Final   C Diff toxin NEGATIVE NEGATIVE Final   C Diff interpretation Results are indeterminate. See PCR results.  Final    Comment: Performed at [facility], [address] [place], [place] [address]  Gastrointestinal Panel by PCR , Stool     Status: None   Collection Time: 01/04/21  1:58 PM   Specimen: Stool  Result Value Ref Range Status   Campylobacter species NOT DETECTED NOT DETECTED Final   Plesimonas shigelloides NOT DETECTED NOT DETECTED Final   Salmonella species NOT DETECTED NOT DETECTED Final   Yersinia enterocolitica NOT DETECTED NOT DETECTED Final   Vibrio species NOT DETECTED NOT DETECTED Final   Vibrio cholerae NOT DETECTED NOT DETECTED Final  Enteroaggregative E coli (EAEC) NOT DETECTED NOT DETECTED Final   Enteropathogenic E coli (EPEC) NOT DETECTED NOT DETECTED Final   Enterotoxigenic E coli (ETEC) NOT DETECTED NOT DETECTED Final   Shiga like toxin producing E coli (STEC) NOT DETECTED NOT DETECTED Final   Shigella/Enteroinvasive E coli (EIEC) NOT DETECTED NOT DETECTED Final   Cryptosporidium NOT DETECTED NOT DETECTED Final   Cyclospora cayetanensis NOT DETECTED NOT DETECTED Final   Entamoeba histolytica NOT DETECTED NOT DETECTED Final   Giardia lamblia NOT DETECTED NOT DETECTED Final   Adenovirus F40/41 NOT DETECTED NOT DETECTED Final   Astrovirus NOT DETECTED NOT DETECTED Final   Norovirus GI/GII NOT DETECTED NOT  DETECTED Final   Rotavirus A NOT DETECTED NOT DETECTED Final   Sapovirus (I, II, IV, and V) NOT DETECTED NOT DETECTED Final    Comment: Performed at [facility], [address] [place], [place] [address]  C. Diff by PCR, Reflexed     Status: Abnormal   Collection Time: 01/04/21  1:58 PM  Result Value Ref Range Status   Toxigenic C. Difficile by PCR POSITIVE (A) NEGATIVE Final    Comment: Positive for toxigenic C. difficile with little to no toxin production. Only treat if clinical presentation suggests symptomatic illness. Performed at [facility], [address] [place],  [address]   Resp Panel by RT-PCR (Flu A&B, Covid) Nasopharyngeal Swab     Status: None   Collection Time: 01/06/21  2:38 PM   Specimen: Nasopharyngeal Swab; Nasopharyngeal(NP) swabs in vial transport medium  Result Value Ref Range Status   SARS Coronavirus 2 by RT PCR NEGATIVE NEGATIVE Final    Comment: (NOTE) SARS-CoV-2 target nucleic acids are NOT DETECTED.  The SARS-CoV-2 RNA is generally detectable in upper respiratory specimens during the acute phase of infection. The lowest concentration of SARS-CoV-2 viral copies this assay can detect is 138 copies/mL. A negative result does not preclude SARS-Cov-2 infection and should not be used as the sole basis for treatment or other patient management decisions. A negative result may occur with  improper specimen collection/handling, submission of specimen other than nasopharyngeal swab, presence of viral mutation(s) within the areas targeted by this assay, and inadequate number of viral copies(<138 copies/mL). A negative result must be combined with clinical observations, patient history, and epidemiological information. The expected result is Negative.  Fact Sheet for Patients:  EntrepreneurPulse.com.au  Fact Sheet for Healthcare Providers:  IncredibleEmployment.be  This test is no t yet approved  or cleared by the [place] FDA and  has been authorized for detection and/or diagnosis of SARS-CoV-2 by FDA under an Emergency Use Authorization (EUA). This EUA will remain  in effect (meaning this test can be used) for the duration of the COVID-19 declaration under Section 564(b)(1) of the Act, 21 U.S.C.section 360bbb-3(b)(1), unless the authorization is terminated  or revoked sooner.       Influenza A by PCR NEGATIVE NEGATIVE Final   Influenza B by PCR NEGATIVE NEGATIVE Final    Comment: (NOTE) The Xpert Xpress SARS-CoV-2/FLU/RSV plus assay is intended as an aid in the diagnosis of influenza from Nasopharyngeal swab specimens and should not be used as a sole basis for treatment. Nasal washings and aspirates are unacceptable for Xpert Xpress SARS-CoV-2/FLU/RSV testing.  Fact Sheet for Patients: EntrepreneurPulse.com.au  Fact Sheet for Healthcare Providers: IncredibleEmployment.be  This test is not yet approved or cleared by the [place] FDA and has been authorized for detection and/or diagnosis of SARS-CoV-2 by FDA under an  Emergency Use Authorization (EUA). This EUA will remain in effect (meaning this test can be used) for the duration of the COVID-19 declaration under Section 564(b)(1) of the Act, 21 U.S.C. section 360bbb-3(b)(1), unless the authorization is terminated or revoked.  Performed at [facility], [address] [place], [place] [address]    Studies/Results: [place] ABD ACUTE 2+V W 1V CHEST  Result Date: 01/11/2021 CLINICAL DATA:  Right-sided abdominal pain. EXAM: DG ABDOMEN ACUTE WITH 1 VIEW CHEST COMPARISON:  January 05, 2021 FINDINGS: There is no evidence of dilated bowel loops or free intraperitoneal air. No radiopaque calculi or other significant radiographic abnormality is seen. Radiopaque surgical clips are seen within the right upper quadrant, with radiopaque surgical sutures noted along the medial  aspect of the left upper quadrant. Subcentimeter phleboliths are noted within the lower pelvis. A right-sided venous Port-A-Cath is seen with its distal tip noted at the junction of the superior vena cava and right atrium. Heart size and mediastinal contours are within normal limits. Both lungs are clear. IMPRESSION: Negative abdominal radiographs.  No acute cardiopulmonary disease. Electronically Signed   By: Virgina Norfolk M.D.   On: 01/11/2021 04:08   Medications:  Scheduled Meds:  Chlorhexidine Gluconate Cloth  6 each Topical Daily   cholecalciferol  1,000 Units Oral Daily   enoxaparin (LOVENOX) injection  40 mg Subcutaneous Q24H   escitalopram  20 mg Oral Q1200   feeding supplement (KATE FARMS STANDARD 1.4)  325 mL Oral TID   fentaNYL  1 patch Transdermal C48G   folic acid  1 mg Oral Daily   lamoTRIgine  50 mg Oral Daily   multivitamin with minerals  1 tablet Oral Daily   pantoprazole (PROTONIX) IV  40 mg Intravenous Q24H   sodium chloride flush  10-40 mL Intracatheter Q12H   sucralfate  1 g Oral TID PC   vancomycin  500 mg Oral QID   cyanocobalamin  1,000 mcg Oral Daily   Continuous Infusions:  dextrose 75 mL/hr at 01/11/21 1635   metronidazole 500 mg (01/11/21 1301)   promethazine (PHENERGAN) injection (IM or IVPB) 12.5 mg (01/11/21 1004)   PRN Meds:.ALPRAZolam, dextrose, HYDROcodone-acetaminophen, HYDROmorphone (DILAUDID) injection, promethazine (PHENERGAN) injection (IM or IVPB), sodium chloride flush   Assessment: Principal Problem:   Colitis due to Clostridioides difficile Active Problems:   Cervical cancer, FIGO stage IIB (HCC)   Leukopenia due to antineoplastic chemotherapy (HCC)   Abdominal pain   Nausea & vomiting   Malnutrition of moderate degree    Plan: Oncology saw the patient and has held XRT as of today with plans to reinitiate treatment in 1 to 2 weeks  It would be helpful to see if her diarrhea starts to improve with holding XRT  Diarrhea was  likely a combination of XRT treatment given CT finding showed inflammation in the small bowel and C. difficile usually would not explain that by itself  Continue treatment for C. difficile as well  If diarrhea does not improve in 1 to 2 days, start Imodium 3-4 times a day, with instructions to hold with any signs of constipation   LOS: 4 days   Vonda Antigua, MD 01/11/2021, 5:15 PM

## 2021-01-11 NOTE — Care Plan (Signed)
Palliative Medicine consult noted. Due to high referral volume, there is a delay seeing this patient. Please call the Palliative Medicine Team office at [phone] if recommendations are needed in the interim.   Thank you for inviting [place] to see this patient.

## 2021-01-11 NOTE — Progress Notes (Signed)
Order received from Dr Karleen Hampshire to hold a.m. sliding scale insulin due to blood sugar being low earlier

## 2021-01-11 NOTE — Progress Notes (Signed)
Hypoglycemic Event  CBG: 62  Treatment: 4 oz juice  Symptoms: "felt bad"  Follow-up CBG: Time:1150 CBG Result:90  Possible Reasons for Event: poor intake  Comments/MD notified:MD aware and changed diet    Vanessa Romero

## 2021-01-11 NOTE — Progress Notes (Signed)
Hypoglycemic Event  CBG: 68  Treatment: 4 oz juice/soda  Symptoms: None  Follow-up CBG: Time:1615 CBG Result:81  Possible Reasons for Event: Inadequate meal intake  Comments/MD notified:MD aware. Continue D10 infusion    Haynes Dage

## 2021-01-11 NOTE — Care Management Important Message (Signed)
Important Message  Patient Details  Name: Vanessa Romero MRN: [id number] Date of Birth: 1975/06/04   Medicare Important Message Given:  N/A - LOS <3 / Initial given by admissions     Dannette Barbara 01/11/2021, 4:04 PM

## 2021-01-11 NOTE — Consult Note (Signed)
[place]  [phone] [phone]) [phone]  ID: Vanessa Romero OB: 1975-12-18  MR#: [id number]  LOV#:[id number]  Patient Care Team: Anselmo Pickler, MD as PCP - General (Internal Medicine)  CHIEF COMPLAINT: Stage IIb adenocarcinoma of the cervix, now with C. difficile colitis.  INTERVAL HISTORY: Patient is a 45 year old female actively receiving weekly chemotherapy along with daily XRT for adenocarcinoma of the cervix who was recently admitted with significant abdominal pain and diarrhea and found to have C. difficile colitis.  She continues to feel terrible, but improved since admission.  She had no neurologic complaints.  She denies any fevers.  She has not poor appetite, but denies weight loss.  She has no chest pain, shortness of breath, cough, or hemoptysis.  She denies any nausea or vomiting.  She has no urinary complaints.  Patient offers no further specific complaints today.  REVIEW OF SYSTEMS:   Review of Systems  Constitutional:  Positive for malaise/fatigue. Negative for fever and weight loss.  Respiratory: Negative.  Negative for cough, hemoptysis and shortness of breath.   Cardiovascular: Negative.  Negative for chest pain and leg swelling.  Gastrointestinal:  Positive for abdominal pain and diarrhea. Negative for nausea and vomiting.  Genitourinary: Negative.  Negative for dysuria.  Musculoskeletal:  Negative for back pain.  Skin: Negative.  Negative for rash.  Neurological:  Positive for weakness. Negative for dizziness, focal weakness and headaches.  Psychiatric/Behavioral: Negative.  The patient is not nervous/anxious.    As per HPI. Otherwise, a complete review of systems is negative.  PAST MEDICAL HISTORY: Past Medical History:  Diagnosis Date   Alcohol abuse    Anemia    Cancer ([place])    Tobacco dependence     PAST SURGICAL HISTORY: Past Surgical History:  Procedure Laterality Date   ABDOMINAL ADHESION SURGERY      bowel obstruction     x2   CERVICAL CONIZATION W/BX N/A 11/26/2020   Procedure: CONIZATION CERVIX WITH BIOPSY;  Surgeon: Malachy Mood, MD;  Location: [facility] ORS;  Service: Gynecology;  Laterality: N/A;   ESOPHAGOGASTRODUODENOSCOPY N/A 11/24/2020   Procedure: ESOPHAGOGASTRODUODENOSCOPY (EGD);  Surgeon: Lin Landsman, MD;  Location: [facility] ENDOSCOPY;  Service: Gastroenterology;  Laterality: N/A;   laparoscopic knee surgery     PORTA CATH INSERTION N/A 12/20/2020   Procedure: PORTA CATH INSERTION;  Surgeon: Algernon Huxley, MD;  Location: [place] CV LAB;  Service: Cardiovascular;  Laterality: N/A;   ROUX-EN-Y GASTRIC BYPASS     TEAR DUCT PROBING     unclogg   VAGOTOMY     VENTRICULOPERITONEAL SHUNT     x6 put in and removals    FAMILY HISTORY: Family History  Problem Relation Age of Onset   Cancer Mother    Cancer Father    Cancer Maternal Grandmother     ADVANCED DIRECTIVES (Y/N):  @ADVDIR @  HEALTH MAINTENANCE: Social History   Tobacco Use   Smoking status: Former    Types: Cigarettes   Smokeless tobacco: Former  Scientific laboratory technician Use: Former  Substance Use Topics   Alcohol use: Not Currently   Drug use: Not Currently     Colonoscopy:  PAP:  Bone density:  Lipid panel:  Allergies  Allergen Reactions   Contrast Media [Iodinated Contrast Media] Hives   Gabapentin Other (See Comments), Rash and Palpitations    Other Reaction: tachycardia Other Reaction: tachycardia    Morphine Dermatitis, Hives, Rash, Swelling and Other (See Comments)    Other reaction(s): Unknown (  comments) Has tolerated hydromorphone (Dilaudid) Immediate after injections arm edema and arm turned bright red Immediate after injections arm edema and arm turned bright red IV Morphine IV Morphine    Sumatriptan Dermatitis, Hives, Itching, Other (See Comments) and Swelling    Other reaction(s): Joint Pain, Other (See Comments), Other (see comments), Unknown (comments) lock jaw Lock  jaw Lock jaw Lock jaw TIGHTENING OF JAW Lock jaw lock jaw Lock jaw TIGHTENING OF JAW Lock jaw    Zolpidem Nausea And Vomiting and Other (See Comments)    Other reaction(s): Other (see comments) sleep walking sleep walking Sleep walking  don't tolerate it well    Erythromycin Diarrhea, Nausea And Vomiting and Nausea Only    Extreme upset stomach    Valproic Acid Rash    Other reaction(s): Other (see comments), Unknown MOOD DISORDER MOOD DISORDER Depakote: Reaction unknown     Acetazolamide     Other reaction(s): Unknown (comments)   Erythromycin Base     Other reaction(s): UNKNOWN   Amoxicillin Rash   Divalproex Sodium Anxiety and Other (See Comments)    Current Facility-Administered Medications  Medication Dose Route Frequency Provider Last Rate Last Admin   ALPRAZolam Duanne Moron) tablet 0.5 mg  0.5 mg Oral BID PRN Hosie Poisson, MD   0.5 mg at 01/11/21 0606   Chlorhexidine Gluconate Cloth 2 % PADS 6 each  6 each Topical Daily Adefeso, Oladapo, DO   6 each at 01/10/21 1035   cholecalciferol (VITAMIN D3) tablet 1,000 Units  1,000 Units Oral Daily Hosie Poisson, MD   1,000 Units at 01/11/21 0803   dextrose 10 % infusion   Intravenous Continuous Fuller Plan A, MD 75 mL/hr at 01/11/21 0010 New Bag at 01/11/21 0010   dextrose 50 % solution 50 mL  1 ampule Intravenous PRN Fuller Plan A, MD   50 mL at 01/11/21 0325   enoxaparin (LOVENOX) injection 40 mg  40 mg Subcutaneous Q24H Adefeso, Oladapo, DO   40 mg at 01/10/21 2014   escitalopram (LEXAPRO) tablet 20 mg  20 mg Oral Q1200 Hosie Poisson, MD   20 mg at 01/11/21 1300   feeding supplement (KATE FARMS STANDARD 1.4) liquid 325 mL  325 mL Oral TID Hosie Poisson, MD   325 mL at 01/10/21 2033   fentaNYL (DURAGESIC) 25 MCG/HR 1 patch  1 patch Transdermal Q72H Hosie Poisson, MD   1 patch at 77/41/28 7867   folic acid (FOLVITE) tablet 1 mg  1 mg Oral Daily Hosie Poisson, MD   1 mg at 01/11/21 0803   HYDROcodone-acetaminophen  (NORCO/VICODIN) 5-325 MG per tablet 2 tablet  2 tablet Oral Q6H PRN Sharion Settler, NP   2 tablet at 01/11/21 0759   HYDROmorphone (DILAUDID) injection 1 mg  1 mg Intravenous Q2H PRN Hosie Poisson, MD   1 mg at 01/11/21 1133   lamoTRIgine (LAMICTAL) tablet 50 mg  50 mg Oral Daily Hosie Poisson, MD   50 mg at 01/11/21 0803   metroNIDAZOLE (FLAGYL) IVPB 500 mg  500 mg Intravenous Q8H Hosie Poisson, MD 100 mL/hr at 01/11/21 1301 500 mg at 01/11/21 1301   multivitamin with minerals tablet 1 tablet  1 tablet Oral Daily Hosie Poisson, MD   1 tablet at 01/11/21 0803   pantoprazole (PROTONIX) injection 40 mg  40 mg Intravenous Q24H Adefeso, Oladapo, DO   40 mg at 01/10/21 2013   promethazine (PHENERGAN) 12.5 mg in sodium chloride 0.9 % 50 mL IVPB  12.5 mg Intravenous Q6H PRN Hosie Poisson,  MD 200 mL/hr at 01/11/21 1004 12.5 mg at 01/11/21 1004   sodium chloride flush (NS) 0.9 % injection 10-40 mL  10-40 mL Intracatheter Q12H Hosie Poisson, MD   10 mL at 01/10/21 2019   sodium chloride flush (NS) 0.9 % injection 10-40 mL  10-40 mL Intracatheter PRN Hosie Poisson, MD       sucralfate (CARAFATE) tablet 1 g  1 g Oral TID PC Hosie Poisson, MD   1 g at 01/11/21 0934   vancomycin (VANCOCIN) capsule 500 mg  500 mg Oral QID Hosie Poisson, MD   500 mg at 01/11/21 9892   vitamin B-12 (CYANOCOBALAMIN) tablet 1,000 mcg  1,000 mcg Oral Daily Hosie Poisson, MD   1,000 mcg at 01/11/21 0803   Facility-Administered Medications Ordered in Other Encounters  Medication Dose Route Frequency Provider Last Rate Last Admin   heparin lock flush 100 UNIT/ML injection             OBJECTIVE: Vitals:   01/11/21 0507 01/11/21 0752  BP: 111/76 124/69  Pulse: 69 79  Resp: 16 14  Temp: 98.5 F (36.9 C) 98.7 F (37.1 C)  SpO2: 100% 100%     Body mass index is 22.47 kg/m.    ECOG FS:1 - Symptomatic but completely ambulatory  General: Well-developed, well-nourished, no acute distress. Eyes: Pink conjunctiva, anicteric  sclera. HEENT: Normocephalic, moist mucous membranes. Lungs: No audible wheezing or coughing. Heart: Regular rate and rhythm. Abdomen: Soft, nontender, no obvious distention. Musculoskeletal: No edema, cyanosis, or clubbing. Neuro: Alert, answering all questions appropriately. Cranial nerves grossly intact. Skin: No rashes or petechiae noted. Psych: Normal affect.  LAB RESULTS:  Lab Results  Component Value Date   NA 139 01/11/2021   K 4.3 01/11/2021   CL 111 01/11/2021   CO2 25 01/11/2021   GLUCOSE 85 01/11/2021   BUN <5 (L) 01/11/2021   CREATININE 0.46 01/11/2021   CALCIUM 8.2 (L) 01/11/2021   PROT 5.0 (L) 01/08/2021   ALBUMIN 2.7 (L) 01/08/2021   AST 17 01/08/2021   ALT 13 01/08/2021   ALKPHOS 35 (L) 01/08/2021   BILITOT 0.4 01/08/2021   GFRNONAA >60 01/11/2021    Lab Results  Component Value Date   WBC 2.3 (L) 01/11/2021   NEUTROABS 1.1 (L) 01/11/2021   HGB 9.8 (L) 01/11/2021   HCT 30.3 (L) 01/11/2021   MCV 81.9 01/11/2021   PLT 188 01/11/2021     STUDIES: CT ABDOMEN PELVIS WO CONTRAST  Result Date: 01/09/2021 CLINICAL DATA:  45 year old female with increasing abdominal pain. Distal colitis on CT several days ago. Cervical cancer. Positive labs for for C difficile on 01/05/2021. EXAM: CT ABDOMEN AND PELVIS WITHOUT CONTRAST TECHNIQUE: Multidetector CT imaging of the abdomen and pelvis was performed following the standard protocol without IV contrast. COMPARISON:  CT Abdomen and Pelvis 01/06/2021. FINDINGS: Lower chest: Negative. Hepatobiliary: Absent gallbladder. Negative noncontrast liver aside from trace inferior perihepatic free fluid with simple fluid density on series 2, image 43. Pancreas: Negative noncontrast pancreas. Spleen: Negative. Adrenals/Urinary Tract: Normal adrenal glands. Noncontrast kidneys appear stable and nonobstructed. Bladder seems to remain normal. Incidental pelvic phleboliths. Stomach/Bowel: Previous Roux-en-Y type gastric bypass. No dilated  small or large bowel loops, but distal small bowel in the anterior pelvis appears severely inflamed (series 2, image 67) with pronounced wall thickening bowel wall thickening and edema continuing to the terminal ileum and ileocecal valve (image 59). Right: And transverse colon appear within normal limits. Splenic flexure within normal limits. Gradual transition in  the distal descending colon 2 inflamed sigmoid and rectum with circumferential wall thickening. Oval roughly 10 mm capsule appears impacted at the gastroesophageal junction on series 2, image 13, but there is no upstream esophageal dilatation evident. Bypassed portion of the stomach contains a small volume of fluid as before. Small volume of fluid also in the duodenum. No free air. There is trace free fluid in the abdomen. Vascular/Lymphatic: Mild Calcified aortic atherosclerosis. Normal caliber abdominal aorta. Vascular patency is not evaluated in the absence of IV contrast. Reproductive: Retroverted uterus as before. Otherwise negative noncontrast appearance. Other: Pelvic mesenteric edema but no definite pelvic free fluid. Musculoskeletal: No acute osseous abnormality identified. IMPRESSION: 1. Progressive and now severe inflammation of distal small bowel in the pelvis, including the terminal ileum. And ongoing distal colitis. Suspect Progressive Clostridium Difficile Enterocolitis in this setting. No pneumoperitoneum or pneumatosis identified. Trace free fluid. 2. Suspect impacted tablet or capsule at the GEJ. But no evidence of associated esophageal obstruction. 3. No other acute or inflammatory process identified on noncontrast CT abdomen and pelvis. Electronically Signed   By: Genevie Ann M.D.   On: 01/09/2021 10:54   CT ABDOMEN PELVIS W CONTRAST  Result Date: 01/06/2021 CLINICAL DATA:  Abdominal cramping and diarrhea. EXAM: CT ABDOMEN AND PELVIS WITH CONTRAST TECHNIQUE: Multidetector CT imaging of the abdomen and pelvis was performed using the  standard protocol following bolus administration of intravenous contrast. CONTRAST:  29mL OMNIPAQUE IOHEXOL 300 MG/ML  SOLN COMPARISON:  None. FINDINGS: Lower chest: No acute abnormality. Hepatobiliary: No focal liver abnormality is seen. Status post cholecystectomy. Common bile duct is dilated and measures 1.4 cm. Pancreas: Unremarkable. No pancreatic ductal dilatation or surrounding inflammatory changes. Spleen: Normal in size without focal abnormality. Adrenals/Urinary Tract: Adrenal glands are unremarkable. Kidneys are normal, without renal calculi, focal lesion, or hydronephrosis. The urinary bladder is partially empty and subsequently limited in evaluation. Stomach/Bowel: Surgical sutures are seen within the gastric region. Appendix appears normal. No evidence of bowel dilatation. Mild to moderate severity diffuse colonic wall thickening is seen throughout the descending and sigmoid colon. Vascular/Lymphatic: Very mild aortic atherosclerosis. No enlarged abdominal or pelvic lymph nodes. Reproductive: The uterus is unremarkable. A 2.1 cm diameter cyst is seen along the posterior aspect of the right adnexa. Other: No abdominal wall hernia or abnormality. No abdominopelvic ascites. Musculoskeletal: No acute or significant osseous findings. IMPRESSION: 1. Mild to moderate severity infectious or inflammatory colitis involving the descending and sigmoid colon. 2. Evidence of prior cholecystectomy. 3. 2.1 cm diameter right adnexal cyst, likely ovarian in origin. 4. Very mild aortic atherosclerosis. Aortic Atherosclerosis (ICD10-I70.0). Electronically Signed   By: Virgina Norfolk M.D.   On: 01/06/2021 19:52   PERIPHERAL VASCULAR CATHETERIZATION  Result Date: 12/20/2020 See surgical note for result.  DG Abd 2 Views  Result Date: 01/05/2021 CLINICAL DATA:  45 year old female with abdominal pain. Recent diarrhea and constipation. Cervical cancer EXAM: ABDOMEN - 2 VIEW COMPARISON:  PET-CT 12/06/2020. FINDINGS:  Upright and supine views of the abdomen and pelvis. Negative lung bases. No pneumoperitoneum. Stable cholecystectomy clips. Non obstructed bowel gas pattern. Moderate volume of retained stool in the colon. Recent PET-CT with Roux-en-Y type gastric bypass. No acute osseous abnormality identified. Pelvic phleboliths. IMPRESSION: Nonobstructed bowel-gas pattern with moderate volume of retained stool. Prior gastric bypass. Electronically Signed   By: Genevie Ann M.D.   On: 01/05/2021 11:24   DG ABD ACUTE 2+V W 1V CHEST  Result Date: 01/11/2021 CLINICAL DATA:  Right-sided abdominal pain. EXAM: DG  ABDOMEN ACUTE WITH 1 VIEW CHEST COMPARISON:  January 05, 2021 FINDINGS: There is no evidence of dilated bowel loops or free intraperitoneal air. No radiopaque calculi or other significant radiographic abnormality is seen. Radiopaque surgical clips are seen within the right upper quadrant, with radiopaque surgical sutures noted along the medial aspect of the left upper quadrant. Subcentimeter phleboliths are noted within the lower pelvis. A right-sided venous Port-A-Cath is seen with its distal tip noted at the junction of the superior vena cava and right atrium. Heart size and mediastinal contours are within normal limits. Both lungs are clear. IMPRESSION: Negative abdominal radiographs.  No acute cardiopulmonary disease. Electronically Signed   By: Virgina Norfolk M.D.   On: 01/11/2021 04:08    ASSESSMENT: Stage IIb adenocarcinoma of the cervix, now with C. difficile colitis.  PLAN:    1.  Stage IIb adenocarcinoma cervix: Patient last received chemotherapy with carboplatinum and Taxol on January 04, 2021.  She is also receiving daily XRT.  Treatment was held today.  Will reinitiate treatment in 1 to 2 weeks when symptoms have resolved.  No further interventions are needed. 2.  C. difficile diarrhea: Appreciate GI input.  Continue current antibiotics and symptomatic treatment as ordered. 3.  Pain: Continue current  narcotics.  Palliative care has been consulted. 4.  Leukopenia: Mild.  Likely secondary to chemotherapy.  Hold treatment as above.  Monitor. 5.  Anemia: Hemoglobin is 9.8, monitor.  Appreciate consult, will follow.  Lloyd Huger, MD   01/11/2021 1:06 PM

## 2021-01-11 NOTE — Progress Notes (Signed)
PROGRESS NOTE    Vanessa Romero  FXT:[id number] DOB: 1975-03-14 DOA: 01/07/2021 PCP: Anselmo Pickler, MD    Chief Complaint  Patient presents with   Abdominal Pain    Brief Narrative:   Vanessa Romero is a 45 y.o. female with medical history significant for cervical carcinoma currently undergoing chemotherapy and radiotherapy, gastric bypass, intracranial hypertension who presents to the emergency department due to about 5-day onset of abdominal pain, nausea, vomiting and diarrhea. Full GI panel done by oncology was negative except for C. difficile without the C. difficile toxin. CT abdomen and pelvis with contrast showed mild to moderate severity infectious or inflammatory colitis involving the descending and sigmoid colon Patient was started on vancomycin and symptomatic management with IV fluids and pain control.  In view of worsening pain , nausea  and the fact that pt is requiring IV dilaudid every 2 hours.  Repeat CT abdomen showed progressive and now severe inflammation of distal small bowel in the pelvis, including the terminal ileum. And ongoing distal colitis. Suspect Progressive Clostridium Difficile Enterocolitis in this setting. No pneumoperitoneum or pneumatosis identified.  GI consulted for further evaluation, suggested both chemo and XRT can be    Assessment & Plan:   Principal Problem:   Colitis due to Clostridioides difficile Active Problems:   Cervical cancer, FIGO stage IIB (HCC)   Leukopenia due to antineoplastic chemotherapy (HCC)   Abdominal pain   Nausea & vomiting   Malnutrition of moderate degree  Progressive and severe C diff enterocolitis:  Pt continues to have watery diarrhea despite increased dose of vancomycin.  Increased the oral vancomycin to higher dose of 500 mg every 6 hours and added IV flagyl to the regimen due to persistent pain and nausea.  Symptomatic management with IV fluids, IV pain control ( added fentanyl patch 83mcg in  addition to hydrocodone and IV Dilaudid) ,  and clear liquid diet.   GI consulted for further recommendations as she reports persistent abdominal pain despite 3 pain meds and xanax.  Plan for flex sigmoidoscopy if no improvement in symptoms.  Pain management consult from palliative care requested.  No signs of peritonitis on exam . Advance diet as tolerated.     Leukopenia probably secondary to antineoplastic chemotherapy ANC is 1.1.    History of cervical cancer last chemotherapy done on Wednesday.  Concurrent radiation treatment.  S/p 3 rd carbo/Taxol.  Oncology consulted, as per diarrhea and pain could be from her malignancy and radiation treatments.    Mild hyponatremia:  Asymptomatic, monitor.  Resolved.    Mild anemia of chronic disease Baseline hemoglobin between 10 to 11. Stable around 9.8.  Continue to monitor Transfuse to keep hemoglobin greater than 7.    Moderate malnutrition due to chronic Illness:  Dietary on board.   DVT prophylaxis: (Lovenox) Code Status: (Full code) Family Communication: none at bedside.  Disposition:   Status is: Inpatient  Remains inpatient appropriate because: IV pain control.        Consultants:  GI Palliative care for pain management .    Procedures: none.   Antimicrobials:  Antibiotics Given (last 72 hours)     Date/Time Action Medication Dose Rate   01/08/21 1833 Given   vancomycin (VANCOCIN) capsule 125 mg 125 mg    01/08/21 2236 Given   vancomycin (VANCOCIN) capsule 125 mg 125 mg    01/09/21 1057 Given   vancomycin (VANCOCIN) capsule 125 mg 125 mg    01/09/21 1346 Given   vancomycin (VANCOCIN) capsule  500 mg 500 mg    01/09/21 1356 New Bag/Given   metroNIDAZOLE (FLAGYL) IVPB 500 mg 500 mg 100 mL/hr   01/09/21 2126 Given   vancomycin (VANCOCIN) capsule 500 mg 500 mg    01/09/21 2133 New Bag/Given   metroNIDAZOLE (FLAGYL) IVPB 500 mg 500 mg 100 mL/hr   01/10/21 0443 New Bag/Given   metroNIDAZOLE (FLAGYL)  IVPB 500 mg 500 mg 100 mL/hr   01/10/21 1035 Given   vancomycin (VANCOCIN) capsule 500 mg 500 mg    01/10/21 1403 Given   vancomycin (VANCOCIN) capsule 500 mg 500 mg    01/10/21 1407 New Bag/Given   metroNIDAZOLE (FLAGYL) IVPB 500 mg 500 mg 100 mL/hr   01/10/21 1707 Given   vancomycin (VANCOCIN) capsule 500 mg 500 mg    01/10/21 2014 Given   vancomycin (VANCOCIN) capsule 500 mg 500 mg    01/11/21 0332 New Bag/Given   metroNIDAZOLE (FLAGYL) IVPB 500 mg 500 mg 100 mL/hr   01/11/21 5035 Given   vancomycin (VANCOCIN) capsule 500 mg 500 mg    01/11/21 1301 New Bag/Given   metroNIDAZOLE (FLAGYL) IVPB 500 mg 500 mg 100 mL/hr   01/11/21 1335 Given   vancomycin (VANCOCIN) capsule 500 mg 500 mg          Subjective: Persistent watery diarrhea, nauseated no vomiting. Persistent abdominal pain.   Objective: Vitals:   01/10/21 2026 01/11/21 0507 01/11/21 0752 01/11/21 1541  BP: 97/62 111/76 124/69 112/72  Pulse: 74 69 79 81  Resp: 16 16 14 16   Temp: 98.4 F (36.9 C) 98.5 F (36.9 C) 98.7 F (37.1 C) 98.4 F (36.9 C)  TempSrc:      SpO2: 100% 100% 100% 100%  Weight:      Height:        Intake/Output Summary (Last 24 hours) at 01/11/2021 1558 Last data filed at 01/11/2021 0900 Gross per 24 hour  Intake 600 ml  Output --  Net 600 ml    Filed Weights   01/07/21 1238 01/08/21 2104 01/10/21 1547  Weight: 54.4 kg 59.2 kg 65.1 kg    Examination:  General exam: malnourished lady not in distress.  Respiratory system: Clear to auscultation. Respiratory effort normal. Cardiovascular system: S1 & S2 heard, RRR. No JVD,  No pedal edema. Gastrointestinal system: Abdomen is soft with generalized tenderness.  Normal bowel sounds heard. Central nervous system: Alert and oriented. No focal neurological deficits. Extremities: Symmetric 5 x 5 power. Skin: No rashes, lesions or ulcers Psychiatry: Mood & affect appropriate.        Data Reviewed: I have personally reviewed  following labs and imaging studies  CBC: Recent Labs  Lab 01/07/21 1240 01/08/21 0658 01/09/21 1145 01/10/21 0455 01/11/21 1150  WBC 3.3* 2.4* 2.0*   2.0* 2.5* 2.3*  NEUTROABS  --   --  1.1*  --  1.1*  HGB 11.1* 11.0* 9.3*   9.5* 9.4* 9.8*  HCT 35.7* 34.8* 29.3*   29.5* 29.2* 30.3*  MCV 81.0 81.3 80.1   79.7* 80.2 81.9  PLT 260 226 209   210 181 188     Basic Metabolic Panel: Recent Labs  Lab 01/07/21 1240 01/08/21 0658 01/09/21 1145 01/10/21 0455 01/11/21 1223  NA 139 139 135 133* 139  K 4.0 4.0 4.1 3.5 4.3  CL 108 112* 108 107 111  CO2 25 22 24 23 25   GLUCOSE 63* 74 90 102* 85  BUN 14 16 13 10  <5*  CREATININE 0.69 0.53 0.56 0.53 0.46  CALCIUM 8.9 7.8* 8.0* 7.8* 8.2*  MG  --  1.8  --   --   --   PHOS  --  4.6  --   --   --      GFR: Estimated Creatinine Clearance: 87.3 mL/min (by C-G formula based on SCr of 0.46 mg/dL).  Liver Function Tests: Recent Labs  Lab 01/06/21 1323 01/07/21 1240 01/08/21 0658  AST 26 20 17   ALT 17 15 13   ALKPHOS 38 41 35*  BILITOT 0.4 0.4 0.4  PROT 5.8* 5.9* 5.0*  ALBUMIN 3.2* 3.3* 2.7*     CBG: Recent Labs  Lab 01/11/21 0528 01/11/21 0754 01/11/21 1111 01/11/21 1147 01/11/21 1538  GLUCAP 91 155* 62* 90 68*      Recent Results (from the past 240 hour(s))  C difficile quick screen w PCR reflex     Status: Abnormal   Collection Time: 01/04/21  1:58 PM   Specimen: STOOL  Result Value Ref Range Status   C Diff antigen POSITIVE (A) NEGATIVE Final   C Diff toxin NEGATIVE NEGATIVE Final   C Diff interpretation Results are indeterminate. See PCR results.  Final    Comment: Performed at [facility], [address] [place], [place] [address]  Gastrointestinal Panel by PCR , Stool     Status: None   Collection Time: 01/04/21  1:58 PM   Specimen: Stool  Result Value Ref Range Status   Campylobacter species NOT DETECTED NOT DETECTED Final   Plesimonas shigelloides NOT DETECTED NOT DETECTED Final    Salmonella species NOT DETECTED NOT DETECTED Final   Yersinia enterocolitica NOT DETECTED NOT DETECTED Final   Vibrio species NOT DETECTED NOT DETECTED Final   Vibrio cholerae NOT DETECTED NOT DETECTED Final   Enteroaggregative E coli (EAEC) NOT DETECTED NOT DETECTED Final   Enteropathogenic E coli (EPEC) NOT DETECTED NOT DETECTED Final   Enterotoxigenic E coli (ETEC) NOT DETECTED NOT DETECTED Final   Shiga like toxin producing E coli (STEC) NOT DETECTED NOT DETECTED Final   Shigella/Enteroinvasive E coli (EIEC) NOT DETECTED NOT DETECTED Final   Cryptosporidium NOT DETECTED NOT DETECTED Final   Cyclospora cayetanensis NOT DETECTED NOT DETECTED Final   Entamoeba histolytica NOT DETECTED NOT DETECTED Final   Giardia lamblia NOT DETECTED NOT DETECTED Final   Adenovirus F40/41 NOT DETECTED NOT DETECTED Final   Astrovirus NOT DETECTED NOT DETECTED Final   Norovirus GI/GII NOT DETECTED NOT DETECTED Final   Rotavirus A NOT DETECTED NOT DETECTED Final   Sapovirus (I, II, IV, and V) NOT DETECTED NOT DETECTED Final    Comment: Performed at [facility], [address] [place], [place] [address]  C. Diff by PCR, Reflexed     Status: Abnormal   Collection Time: 01/04/21  1:58 PM  Result Value Ref Range Status   Toxigenic C. Difficile by PCR POSITIVE (A) NEGATIVE Final    Comment: Positive for toxigenic C. difficile with little to no toxin production. Only treat if clinical presentation suggests symptomatic illness. Performed at [facility], [address] [place], [place] [address]   Resp Panel by RT-PCR (Flu A&B, Covid) Nasopharyngeal Swab     Status: None   Collection Time: 01/06/21  2:38 PM   Specimen: Nasopharyngeal Swab; Nasopharyngeal(NP) swabs in vial transport medium  Result Value Ref Range Status   SARS Coronavirus 2 by RT PCR NEGATIVE NEGATIVE Final    Comment: (NOTE) SARS-CoV-2 target nucleic acids are NOT DETECTED.  The SARS-CoV-2 RNA is  generally detectable  in upper respiratory specimens during the acute phase of infection. The lowest concentration of SARS-CoV-2 viral copies this assay can detect is 138 copies/mL. A negative result does not preclude SARS-Cov-2 infection and should not be used as the sole basis for treatment or other patient management decisions. A negative result may occur with  improper specimen collection/handling, submission of specimen other than nasopharyngeal swab, presence of viral mutation(s) within the areas targeted by this assay, and inadequate number of viral copies(<138 copies/mL). A negative result must be combined with clinical observations, patient history, and epidemiological information. The expected result is Negative.  Fact Sheet for Patients:  EntrepreneurPulse.com.au  Fact Sheet for Healthcare Providers:  IncredibleEmployment.be  This test is no t yet approved or cleared by the [place] FDA and  has been authorized for detection and/or diagnosis of SARS-CoV-2 by FDA under an Emergency Use Authorization (EUA). This EUA will remain  in effect (meaning this test can be used) for the duration of the COVID-19 declaration under Section 564(b)(1) of the Act, 21 U.S.C.section 360bbb-3(b)(1), unless the authorization is terminated  or revoked sooner.       Influenza A by PCR NEGATIVE NEGATIVE Final   Influenza B by PCR NEGATIVE NEGATIVE Final    Comment: (NOTE) The Xpert Xpress SARS-CoV-2/FLU/RSV plus assay is intended as an aid in the diagnosis of influenza from Nasopharyngeal swab specimens and should not be used as a sole basis for treatment. Nasal washings and aspirates are unacceptable for Xpert Xpress SARS-CoV-2/FLU/RSV testing.  Fact Sheet for Patients: EntrepreneurPulse.com.au  Fact Sheet for Healthcare Providers: IncredibleEmployment.be  This test is not yet approved or cleared by the [place] FDA and has  been authorized for detection and/or diagnosis of SARS-CoV-2 by FDA under an Emergency Use Authorization (EUA). This EUA will remain in effect (meaning this test can be used) for the duration of the COVID-19 declaration under Section 564(b)(1) of the Act, 21 U.S.C. section 360bbb-3(b)(1), unless the authorization is terminated or revoked.  Performed at [facility], [address], [place], [place] [address]           Radiology Studies: DG ABD ACUTE 2+V W 1V CHEST  Result Date: 01/11/2021 CLINICAL DATA:  Right-sided abdominal pain. EXAM: DG ABDOMEN ACUTE WITH 1 VIEW CHEST COMPARISON:  January 05, 2021 FINDINGS: There is no evidence of dilated bowel loops or free intraperitoneal air. No radiopaque calculi or other significant radiographic abnormality is seen. Radiopaque surgical clips are seen within the right upper quadrant, with radiopaque surgical sutures noted along the medial aspect of the left upper quadrant. Subcentimeter phleboliths are noted within the lower pelvis. A right-sided venous Port-A-Cath is seen with its distal tip noted at the junction of the superior vena cava and right atrium. Heart size and mediastinal contours are within normal limits. Both lungs are clear. IMPRESSION: Negative abdominal radiographs.  No acute cardiopulmonary disease. Electronically Signed   By: Virgina Norfolk M.D.   On: 01/11/2021 04:08        Scheduled Meds:  Chlorhexidine Gluconate Cloth  6 each Topical Daily   cholecalciferol  1,000 Units Oral Daily   enoxaparin (LOVENOX) injection  40 mg Subcutaneous Q24H   escitalopram  20 mg Oral Q1200   feeding supplement (KATE FARMS STANDARD 1.4)  325 mL Oral TID   fentaNYL  1 patch Transdermal B63S   folic acid  1 mg Oral Daily   lamoTRIgine  50 mg Oral Daily   multivitamin with minerals  1  tablet Oral Daily   pantoprazole (PROTONIX) IV  40 mg Intravenous Q24H   sodium chloride flush  10-40 mL Intracatheter Q12H   sucralfate  1 g  Oral TID PC   vancomycin  500 mg Oral QID   cyanocobalamin  1,000 mcg Oral Daily   Continuous Infusions:  dextrose 75 mL/hr at 01/11/21 0010   metronidazole 500 mg (01/11/21 1301)   promethazine (PHENERGAN) injection (IM or IVPB) 12.5 mg (01/11/21 1004)     LOS: 4 days        Hosie Poisson, MD [facility]   To contact the attending provider between 7A-7P or the covering provider during after hours 7P-7A, please log into the web site [email or web address] and access using universal [place] password for that web site. If you do not have the password, please call the hospital operator.  01/11/2021, 3:58 PM

## 2021-01-11 NOTE — Telephone Encounter (Signed)
Pt calld to let us know that she has been in hospital since 12-23. She will let us know when she is discharged from hospital.

## 2021-01-12 ENCOUNTER — Ambulatory Visit: Payer: Medicare Other

## 2021-01-12 DIAGNOSIS — R109 Unspecified abdominal pain: Secondary | ICD-10-CM | POA: Diagnosis not present

## 2021-01-12 DIAGNOSIS — A0472 Enterocolitis due to Clostridium difficile, not specified as recurrent: Secondary | ICD-10-CM | POA: Diagnosis not present

## 2021-01-12 DIAGNOSIS — D638 Anemia in other chronic diseases classified elsewhere: Secondary | ICD-10-CM

## 2021-01-12 LAB — BASIC METABOLIC PANEL
Anion gap: 6 (ref 5–15)
BUN: 5 mg/dL — ABNORMAL LOW (ref 6–20)
CO2: 26 mmol/L (ref 22–32)
Calcium: 8.5 mg/dL — ABNORMAL LOW (ref 8.9–10.3)
Chloride: 105 mmol/L (ref 98–111)
Creatinine, Ser: 0.51 mg/dL (ref 0.44–1.00)
GFR, Estimated: >60 mL/min (ref >60)
Glucose, Bld: 161 mg/dL — ABNORMAL HIGH (ref 70–99)
Potassium: 4.2 mmol/L (ref 3.5–5.1)
Sodium: 137 mmol/L (ref 135–145)

## 2021-01-12 LAB — CBC
HCT: 32.5 % — ABNORMAL LOW (ref 36.0–46.0)
Hemoglobin: 10.2 g/dL — ABNORMAL LOW (ref 12.0–15.0)
MCH: 25.3 pg — ABNORMAL LOW (ref 26.0–34.0)
MCHC: 31.4 g/dL (ref 30.0–36.0)
MCV: 80.6 fL (ref 80.0–100.0)
Platelets: 169 10*3/uL (ref 150–400)
RBC: 4.03 MIL/uL (ref 3.87–5.11)
WBC: 3.1 10*3/uL — ABNORMAL LOW (ref 4.0–10.5)
nRBC: 0 % (ref 0.0–0.2)

## 2021-01-12 LAB — GLUCOSE, CAPILLARY
Glucose-Capillary: 116 mg/dL — ABNORMAL HIGH (ref 70–99)
Glucose-Capillary: 69 mg/dL — ABNORMAL LOW (ref 70–99)
Glucose-Capillary: 77 mg/dL (ref 70–99)
Glucose-Capillary: 88 mg/dL (ref 70–99)
Glucose-Capillary: 90 mg/dL (ref 70–99)
Glucose-Capillary: 94 mg/dL (ref 70–99)

## 2021-01-12 MED ORDER — ZINC OXIDE 40 % EX OINT
TOPICAL_OINTMENT | Freq: Two times a day (BID) | CUTANEOUS | Status: DC
  Administered 2021-01-12: 1 via TOPICAL
  Filled 2021-01-12: qty 113

## 2021-01-12 MED ORDER — LOPERAMIDE HCL 2 MG PO CAPS
2.0000 mg | ORAL_CAPSULE | ORAL | Status: DC | PRN
  Administered 2021-01-12 (×2): 2 mg via ORAL
  Filled 2021-01-12 (×2): qty 1

## 2021-01-12 NOTE — Progress Notes (Signed)
PROGRESS NOTE    Vanessa Romero  XLK:[id number] DOB: Aug 23, 1975 DOA: 01/07/2021 PCP: Anselmo Pickler, MD   Assessment & Plan:   Principal Problem:   Colitis due to Clostridioides difficile Active Problems:   Cervical cancer, FIGO stage IIB (HCC)   Leukopenia due to antineoplastic chemotherapy ([name])   Abdominal pain   Nausea & vomiting   Malnutrition of moderate degree   Progressive and severe c. diff enterocolitis: still w/ watery diarrhea. Continue on po vanco & IV flagyl. Started on imodium today as per GI. Continue on fentanyl patch, norco, & dilaudid. Possible plan for flex sigmoidoscopy if no improvement in symptoms as per GI   Leukopenia: likely secondary to recent chemo    Hx of cervical cancer: stage IIb as per pt. S/p chemo & radiation recently. Diarrhea and pain could be from her malignancy and radiation treatments as per onco   Hyponatremia: resolved   ACD: H&H labile. No need for a transfuse currently    Moderate malnutrition: secondary to chronic Illness. Continue on nutritional supplements     DVT prophylaxis: lovenox  Code Status: full  Family Communication:  Disposition Plan: d/c back home   Level of care: Med-Surg  Status is: Inpatient  Remains inpatient appropriate because: severity of illness, still having multiple episodes of diarrhea     Consultants:  GI  Procedures:   Antimicrobials: po vanco, IV flagyl    Subjective: Pt c/o watery diarrhea  Objective: Vitals:   01/11/21 1541 01/11/21 1959 01/12/21 0345 01/12/21 0840  BP: 112/72 120/75 103/61 112/73  Pulse: 81 65 76 77  Resp: 16 18 18 16   Temp: 98.4 F (36.9 C) 98.6 F (37 C) 98.7 F (37.1 C) 98.8 F (37.1 C)  TempSrc:      SpO2: 100% 100% 98% 98%  Weight:      Height:        Intake/Output Summary (Last 24 hours) at 01/12/2021 0905 Last data filed at 01/12/2021 0319 Gross per 24 hour  Intake 2078.67 ml  Output --  Net 2078.67 ml   Filed Weights    01/07/21 1238 01/08/21 2104 01/10/21 1547  Weight: 54.4 kg 59.2 kg 65.1 kg    Examination:  General exam: Appears calm and comfortable  Respiratory system: Clear to auscultation. Respiratory effort normal. Cardiovascular system: S1 & S2 +. No rubs, gallops or clicks. No pedal edema. Gastrointestinal system: Abdomen is nondistended, soft and nontender. Normal bowel sounds heard. Central nervous system: Alert and oriented. Moves all extremities  Psychiatry: Judgement and insight appear normal. Mood & affect appropriate.     Data Reviewed: I have personally reviewed following labs and imaging studies  CBC: Recent Labs  Lab 01/08/21 0658 01/09/21 1145 01/10/21 0455 01/11/21 1150 01/12/21 0536  WBC 2.4* 2.0*   2.0* 2.5* 2.3* 3.1*  NEUTROABS  --  1.1*  --  1.1*  --   HGB 11.0* 9.3*   9.5* 9.4* 9.8* 10.2*  HCT 34.8* 29.3*   29.5* 29.2* 30.3* 32.5*  MCV 81.3 80.1   79.7* 80.2 81.9 80.6  PLT 226 209   210 181 188 366   Basic Metabolic Panel: Recent Labs  Lab 01/08/21 0658 01/09/21 1145 01/10/21 0455 01/11/21 1223 01/12/21 0536  NA 139 135 133* 139 137  K 4.0 4.1 3.5 4.3 4.2  CL 112* 108 107 111 105  CO2 22 24 23 25 26   GLUCOSE 74 90 102* 85 161*  BUN 16 13 10  <5* 5*  CREATININE 0.53 0.56 0.53  0.46 0.51  CALCIUM 7.8* 8.0* 7.8* 8.2* 8.5*  MG 1.8  --   --   --   --   PHOS 4.6  --   --   --   --    GFR: Estimated Creatinine Clearance: 87.3 mL/min (by C-G formula based on SCr of 0.51 mg/dL). Liver Function Tests: Recent Labs  Lab 01/06/21 1323 01/07/21 1240 01/08/21 0658  AST 26 20 17   ALT 17 15 13   ALKPHOS 38 41 35*  BILITOT 0.4 0.4 0.4  PROT 5.8* 5.9* 5.0*  ALBUMIN 3.2* 3.3* 2.7*   Recent Labs  Lab 01/06/21 1323 01/07/21 1240  LIPASE 25 27   No results for input(s): AMMONIA in the last 168 hours. Coagulation Profile: No results for input(s): INR, PROTIME in the last 168 hours. Cardiac Enzymes: No results for input(s): CKTOTAL, CKMB, CKMBINDEX, TROPONINI  in the last 168 hours. BNP (last 3 results) No results for input(s): PROBNP in the last 8760 hours. HbA1C: No results for input(s): HGBA1C in the last 72 hours. CBG: Recent Labs  Lab 01/11/21 1613 01/11/21 1956 01/12/21 0015 01/12/21 0352 01/12/21 0841  GLUCAP 81 94 94 90 77   Lipid Profile: No results for input(s): CHOL, HDL, LDLCALC, TRIG, CHOLHDL, LDLDIRECT in the last 72 hours. Thyroid Function Tests: No results for input(s): TSH, T4TOTAL, FREET4, T3FREE, THYROIDAB in the last 72 hours. Anemia Panel: No results for input(s): VITAMINB12, FOLATE, FERRITIN, TIBC, IRON, RETICCTPCT in the last 72 hours. Sepsis Labs: Recent Labs  Lab 01/06/21 1537  LATICACIDVEN 0.7    Recent Results (from the past 240 hour(s))  C difficile quick screen w PCR reflex     Status: Abnormal   Collection Time: 01/04/21  1:58 PM   Specimen: STOOL  Result Value Ref Range Status   C Diff antigen POSITIVE (A) NEGATIVE Final   C Diff toxin NEGATIVE NEGATIVE Final   C Diff interpretation Results are indeterminate. See PCR results.  Final    Comment: Performed at [facility], [address] [place], [place] [address]  Gastrointestinal Panel by PCR , Stool     Status: None   Collection Time: 01/04/21  1:58 PM   Specimen: Stool  Result Value Ref Range Status   Campylobacter species NOT DETECTED NOT DETECTED Final   Plesimonas shigelloides NOT DETECTED NOT DETECTED Final   Salmonella species NOT DETECTED NOT DETECTED Final   Yersinia enterocolitica NOT DETECTED NOT DETECTED Final   Vibrio species NOT DETECTED NOT DETECTED Final   Vibrio cholerae NOT DETECTED NOT DETECTED Final   Enteroaggregative E coli (EAEC) NOT DETECTED NOT DETECTED Final   Enteropathogenic E coli (EPEC) NOT DETECTED NOT DETECTED Final   Enterotoxigenic E coli (ETEC) NOT DETECTED NOT DETECTED Final   Shiga like toxin producing E coli (STEC) NOT DETECTED NOT DETECTED Final   Shigella/Enteroinvasive E coli (EIEC) NOT  DETECTED NOT DETECTED Final   Cryptosporidium NOT DETECTED NOT DETECTED Final   Cyclospora cayetanensis NOT DETECTED NOT DETECTED Final   Entamoeba histolytica NOT DETECTED NOT DETECTED Final   Giardia lamblia NOT DETECTED NOT DETECTED Final   Adenovirus F40/41 NOT DETECTED NOT DETECTED Final   Astrovirus NOT DETECTED NOT DETECTED Final   Norovirus GI/GII NOT DETECTED NOT DETECTED Final   Rotavirus A NOT DETECTED NOT DETECTED Final   Sapovirus (I, II, IV, and V) NOT DETECTED NOT DETECTED Final    Comment: Performed at [facility], [address] [place], [place] [address]  C. Diff by  PCR, Reflexed     Status: Abnormal   Collection Time: 01/04/21  1:58 PM  Result Value Ref Range Status   Toxigenic C. Difficile by PCR POSITIVE (A) NEGATIVE Final    Comment: Positive for toxigenic C. difficile with little to no toxin production. Only treat if clinical presentation suggests symptomatic illness. Performed at [facility], [address] [place], [place] [address]   Resp Panel by RT-PCR (Flu A&B, Covid) Nasopharyngeal Swab     Status: None   Collection Time: 01/06/21  2:38 PM   Specimen: Nasopharyngeal Swab; Nasopharyngeal(NP) swabs in vial transport medium  Result Value Ref Range Status   SARS Coronavirus 2 by RT PCR NEGATIVE NEGATIVE Final    Comment: (NOTE) SARS-CoV-2 target nucleic acids are NOT DETECTED.  The SARS-CoV-2 RNA is generally detectable in upper respiratory specimens during the acute phase of infection. The lowest concentration of SARS-CoV-2 viral copies this assay can detect is 138 copies/mL. A negative result does not preclude SARS-Cov-2 infection and should not be used as the sole basis for treatment or other patient management decisions. A negative result may occur with  improper specimen collection/handling, submission of specimen other than nasopharyngeal swab, presence of viral mutation(s) within the areas targeted by this assay, and  inadequate number of viral copies(<138 copies/mL). A negative result must be combined with clinical observations, patient history, and epidemiological information. The expected result is Negative.  Fact Sheet for Patients:  EntrepreneurPulse.com.au  Fact Sheet for Healthcare Providers:  IncredibleEmployment.be  This test is no t yet approved or cleared by the [place] FDA and  has been authorized for detection and/or diagnosis of SARS-CoV-2 by FDA under an Emergency Use Authorization (EUA). This EUA will remain  in effect (meaning this test can be used) for the duration of the COVID-19 declaration under Section 564(b)(1) of the Act, 21 U.S.C.section 360bbb-3(b)(1), unless the authorization is terminated  or revoked sooner.       Influenza A by PCR NEGATIVE NEGATIVE Final   Influenza B by PCR NEGATIVE NEGATIVE Final    Comment: (NOTE) The Xpert Xpress SARS-CoV-2/FLU/RSV plus assay is intended as an aid in the diagnosis of influenza from Nasopharyngeal swab specimens and should not be used as a sole basis for treatment. Nasal washings and aspirates are unacceptable for Xpert Xpress SARS-CoV-2/FLU/RSV testing.  Fact Sheet for Patients: EntrepreneurPulse.com.au  Fact Sheet for Healthcare Providers: IncredibleEmployment.be  This test is not yet approved or cleared by the [place] FDA and has been authorized for detection and/or diagnosis of SARS-CoV-2 by FDA under an Emergency Use Authorization (EUA). This EUA will remain in effect (meaning this test can be used) for the duration of the COVID-19 declaration under Section 564(b)(1) of the Act, 21 U.S.C. section 360bbb-3(b)(1), unless the authorization is terminated or revoked.  Performed at [facility], [address], [place], [place] [address]          Radiology Studies: DG ABD ACUTE 2+V W 1V CHEST  Result Date:  01/11/2021 CLINICAL DATA:  Right-sided abdominal pain. EXAM: DG ABDOMEN ACUTE WITH 1 VIEW CHEST COMPARISON:  January 05, 2021 FINDINGS: There is no evidence of dilated bowel loops or free intraperitoneal air. No radiopaque calculi or other significant radiographic abnormality is seen. Radiopaque surgical clips are seen within the right upper quadrant, with radiopaque surgical sutures noted along the medial aspect of the left upper quadrant. Subcentimeter phleboliths are noted within the lower pelvis. A right-sided venous Port-A-Cath is seen with its distal tip  noted at the junction of the superior vena cava and right atrium. Heart size and mediastinal contours are within normal limits. Both lungs are clear. IMPRESSION: Negative abdominal radiographs.  No acute cardiopulmonary disease. Electronically Signed   By: Virgina Norfolk M.D.   On: 01/11/2021 04:08        Scheduled Meds:  Chlorhexidine Gluconate Cloth  6 each Topical Daily   cholecalciferol  1,000 Units Oral Daily   enoxaparin (LOVENOX) injection  40 mg Subcutaneous Q24H   escitalopram  20 mg Oral Q1200   feeding supplement (KATE FARMS STANDARD 1.4)  325 mL Oral TID   fentaNYL  1 patch Transdermal T34K   folic acid  1 mg Oral Daily   lamoTRIgine  50 mg Oral Daily   multivitamin with minerals  1 tablet Oral Daily   pantoprazole (PROTONIX) IV  40 mg Intravenous Q24H   sodium chloride flush  10-40 mL Intracatheter Q12H   sucralfate  1 g Oral TID PC   vancomycin  500 mg Oral QID   cyanocobalamin  1,000 mcg Oral Daily   Continuous Infusions:  dextrose Stopped (01/11/21 1838)   metronidazole 500 mg (01/12/21 0542)   promethazine (PHENERGAN) injection (IM or IVPB) 12.5 mg (01/12/21 0342)     LOS: 5 days    Time spent: 33 mins     Wyvonnia Dusky, MD [facility] Pager [phone]  If 7PM-7AM, please contact night-coverage 01/12/2021, 9:05 AM

## 2021-01-12 NOTE — Progress Notes (Signed)
Vanessa Antigua, MD [address] [place], [place], [place], [address] [place], [place], [place], [address] Phone: [phone]  Fax: [phone]   Subjective: Patient reports continued loose stools and associated blood streaks with bowel movements   Objective: Exam: Vital signs in last 24 hours: Vitals:   01/11/21 1541 01/11/21 1959 01/12/21 0345 01/12/21 0840  BP: 112/72 120/75 103/61 112/73  Pulse: 81 65 76 77  Resp: 16 18 18 16   Temp: 98.4 F (36.9 C) 98.6 F (37 C) 98.7 F (37.1 C) 98.8 F (37.1 C)  TempSrc:      SpO2: 100% 100% 98% 98%  Weight:      Height:       Weight change:   Intake/Output Summary (Last 24 hours) at 01/12/2021 1202 Last data filed at 01/12/2021 0319 Gross per 24 hour  Intake 2078.67 ml  Output --  Net 2078.67 ml    Constitutional: General:   Alert,  Well-developed, well-nourished, pleasant and cooperative in NAD BP 112/73 (BP Location: Left Arm)    Pulse 77    Temp 98.8 F (37.1 C)    Resp 16    Ht 5' 7.01" (1.702 m)    Wt 65.1 kg    LMP 01/04/2021    SpO2 98%    BMI 22.47 kg/m   Eyes:  Sclera clear, no icterus.   Conjunctiva pink.   Ears:  No scars, lesions or masses, Normal auditory acuity. Nose:  No deformity, discharge, or lesions. Mouth:  No deformity or lesions, oropharynx pink & moist.  Neck:  Supple; no masses, trachea midline  Respiratory: Normal respiratory effort  Gastrointestinal: Tender to palpation right lower quadrant.  Soft, non-distended without masses,  No guarding or rebound tenderness.     Cardiac: No clubbing or edema.  No cyanosis. Normal posterior tibial pedal pulses noted.  Lymphatic:  No significant cervical adenopathy.  Psych:  Alert and cooperative. Normal mood and affect.  Musculoskeletal:  Head normocephalic, atraumatic. 5/5 Lower extremity strength bilaterally.  Skin: Warm. Intact without significant lesions or rashes. No jaundice.  Neurologic:  Face symmetrical, tongue  midline, Normal sensation to touch  Psych:  Alert and oriented x3, Alert and cooperative. Normal mood and affect.   Lab Results: Lab Results  Component Value Date   WBC 3.1 (L) 01/12/2021   HGB 10.2 (L) 01/12/2021   HCT 32.5 (L) 01/12/2021   MCV 80.6 01/12/2021   PLT 169 01/12/2021   Micro Results: Recent Results (from the past 240 hour(s))  C difficile quick screen w PCR reflex     Status: Abnormal   Collection Time: 01/04/21  1:58 PM   Specimen: STOOL  Result Value Ref Range Status   C Diff antigen POSITIVE (A) NEGATIVE Final   C Diff toxin NEGATIVE NEGATIVE Final   C Diff interpretation Results are indeterminate. See PCR results.  Final    Comment: Performed at [facility], [address] [place], [place] [address]  Gastrointestinal Panel by PCR , Stool     Status: None   Collection Time: 01/04/21  1:58 PM   Specimen: Stool  Result Value Ref Range Status   Campylobacter species NOT DETECTED NOT DETECTED Final   Plesimonas shigelloides NOT DETECTED NOT DETECTED Final   Salmonella species NOT DETECTED NOT DETECTED Final   Yersinia enterocolitica NOT DETECTED NOT DETECTED Final   Vibrio species NOT DETECTED NOT DETECTED Final   Vibrio cholerae NOT DETECTED NOT DETECTED Final   Enteroaggregative E coli (  EAEC) NOT DETECTED NOT DETECTED Final   Enteropathogenic E coli (EPEC) NOT DETECTED NOT DETECTED Final   Enterotoxigenic E coli (ETEC) NOT DETECTED NOT DETECTED Final   Shiga like toxin producing E coli (STEC) NOT DETECTED NOT DETECTED Final   Shigella/Enteroinvasive E coli (EIEC) NOT DETECTED NOT DETECTED Final   Cryptosporidium NOT DETECTED NOT DETECTED Final   Cyclospora cayetanensis NOT DETECTED NOT DETECTED Final   Entamoeba histolytica NOT DETECTED NOT DETECTED Final   Giardia lamblia NOT DETECTED NOT DETECTED Final   Adenovirus F40/41 NOT DETECTED NOT DETECTED Final   Astrovirus NOT DETECTED NOT DETECTED Final   Norovirus GI/GII NOT DETECTED NOT DETECTED  Final   Rotavirus A NOT DETECTED NOT DETECTED Final   Sapovirus (I, II, IV, and V) NOT DETECTED NOT DETECTED Final    Comment: Performed at [facility], [address] [place], [place] [address]  C. Diff by PCR, Reflexed     Status: Abnormal   Collection Time: 01/04/21  1:58 PM  Result Value Ref Range Status   Toxigenic C. Difficile by PCR POSITIVE (A) NEGATIVE Final    Comment: Positive for toxigenic C. difficile with little to no toxin production. Only treat if clinical presentation suggests symptomatic illness. Performed at [facility], [address] [place], [place] [address]   Resp Panel by RT-PCR (Flu A&B, Covid) Nasopharyngeal Swab     Status: None   Collection Time: 01/06/21  2:38 PM   Specimen: Nasopharyngeal Swab; Nasopharyngeal(NP) swabs in vial transport medium  Result Value Ref Range Status   SARS Coronavirus 2 by RT PCR NEGATIVE NEGATIVE Final    Comment: (NOTE) SARS-CoV-2 target nucleic acids are NOT DETECTED.  The SARS-CoV-2 RNA is generally detectable in upper respiratory specimens during the acute phase of infection. The lowest concentration of SARS-CoV-2 viral copies this assay can detect is 138 copies/mL. A negative result does not preclude SARS-Cov-2 infection and should not be used as the sole basis for treatment or other patient management decisions. A negative result may occur with  improper specimen collection/handling, submission of specimen other than nasopharyngeal swab, presence of viral mutation(s) within the areas targeted by this assay, and inadequate number of viral copies(<138 copies/mL). A negative result must be combined with clinical observations, patient history, and epidemiological information. The expected result is Negative.  Fact Sheet for Patients:  EntrepreneurPulse.com.au  Fact Sheet for Healthcare Providers:  IncredibleEmployment.be  This test is no t yet approved or cleared  by the [place] FDA and  has been authorized for detection and/or diagnosis of SARS-CoV-2 by FDA under an Emergency Use Authorization (EUA). This EUA will remain  in effect (meaning this test can be used) for the duration of the COVID-19 declaration under Section 564(b)(1) of the Act, 21 U.S.C.section 360bbb-3(b)(1), unless the authorization is terminated  or revoked sooner.       Influenza A by PCR NEGATIVE NEGATIVE Final   Influenza B by PCR NEGATIVE NEGATIVE Final    Comment: (NOTE) The Xpert Xpress SARS-CoV-2/FLU/RSV plus assay is intended as an aid in the diagnosis of influenza from Nasopharyngeal swab specimens and should not be used as a sole basis for treatment. Nasal washings and aspirates are unacceptable for Xpert Xpress SARS-CoV-2/FLU/RSV testing.  Fact Sheet for Patients: EntrepreneurPulse.com.au  Fact Sheet for Healthcare Providers: IncredibleEmployment.be  This test is not yet approved or cleared by the [place] FDA and has been authorized for detection and/or diagnosis of SARS-CoV-2 by FDA under an Emergency Use Authorization (  EUA). This EUA will remain in effect (meaning this test can be used) for the duration of the COVID-19 declaration under Section 564(b)(1) of the Act, 21 U.S.C. section 360bbb-3(b)(1), unless the authorization is terminated or revoked.  Performed at [facility], [address] [place], [place] [address]    Studies/Results: [place] ABD ACUTE 2+V W 1V CHEST  Result Date: 01/11/2021 CLINICAL DATA:  Right-sided abdominal pain. EXAM: DG ABDOMEN ACUTE WITH 1 VIEW CHEST COMPARISON:  January 05, 2021 FINDINGS: There is no evidence of dilated bowel loops or free intraperitoneal air. No radiopaque calculi or other significant radiographic abnormality is seen. Radiopaque surgical clips are seen within the right upper quadrant, with radiopaque surgical sutures noted along the medial aspect of the  left upper quadrant. Subcentimeter phleboliths are noted within the lower pelvis. A right-sided venous Port-A-Cath is seen with its distal tip noted at the junction of the superior vena cava and right atrium. Heart size and mediastinal contours are within normal limits. Both lungs are clear. IMPRESSION: Negative abdominal radiographs.  No acute cardiopulmonary disease. Electronically Signed   By: Virgina Norfolk M.D.   On: 01/11/2021 04:08   Medications:  Scheduled Meds:  Chlorhexidine Gluconate Cloth  6 each Topical Daily   cholecalciferol  1,000 Units Oral Daily   enoxaparin (LOVENOX) injection  40 mg Subcutaneous Q24H   escitalopram  20 mg Oral Q1200   feeding supplement (KATE FARMS STANDARD 1.4)  325 mL Oral TID   fentaNYL  1 patch Transdermal H08M   folic acid  1 mg Oral Daily   lamoTRIgine  50 mg Oral Daily   multivitamin with minerals  1 tablet Oral Daily   pantoprazole (PROTONIX) IV  40 mg Intravenous Q24H   sodium chloride flush  10-40 mL Intracatheter Q12H   sucralfate  1 g Oral TID PC   vancomycin  500 mg Oral QID   cyanocobalamin  1,000 mcg Oral Daily   Continuous Infusions:  dextrose Stopped (01/11/21 1838)   metronidazole 500 mg (01/12/21 0542)   promethazine (PHENERGAN) injection (IM or IVPB) 12.5 mg (01/12/21 0342)   PRN Meds:.ALPRAZolam, dextrose, HYDROcodone-acetaminophen, HYDROmorphone (DILAUDID) injection, loperamide, promethazine (PHENERGAN) injection (IM or IVPB), sodium chloride flush   Assessment: Principal Problem:   Colitis due to Clostridioides difficile Active Problems:   Cervical cancer, FIGO stage IIB (HCC)   Leukopenia due to antineoplastic chemotherapy (HCC)   Abdominal pain   Nausea & vomiting   Malnutrition of moderate degree    Plan: Patient has continued diarrhea  Please note that patient's diarrhea is not considered to be primarily from C. difficile.  In fact, her C. difficile toxin was negative and she is being treated for C. difficile  due to her diarrhea from other causes including XRT/chemotherapy  In addition, CT scan shows inflammation in the distal small bowel, and typically C. difficile is known to cause colitis and very rarely, enteritis  Her small bowel findings and diarrhea are likely due to her XRT/chemotherapy as well which is being held by oncology at this point  Treatment for diarrhea from XRT includes antidiarrheal such as loperamide  As far as antimotility agents being used in C. difficile, as per up-to-date "Antimotility agents (eg, loperamide, diphenoxylate-atropine) have traditionally been avoided in CDI, but the evidence that they cause harm is equivocal. We reserve use of these agents for patients in whom there is difficulty keeping up with fluid losses, in the absence of ileus or colonic distention."  Patient does not have any  evidence of ileus or colonic distention, including on x-ray done yesterday.  Abdomen is not distended.  She is not obstructed and is passing flatus, and does not have any nausea or vomiting.  Given ongoing diarrhea despite treatment of C. difficile and holding XRT recently, appropriate to start patient on loperamide with careful titration  I have discussed with this with primary team, Dr. Jimmye Norman and they agree with the plan.  I have added loperamide as needed to the medication list and discussed this with the patient so she knows to ask for it.  However, I have informed her and Dr. Jimmye Norman, to monitor bowel movements closely to avoid constipation and if bowel movements start to become firm, or she has constipation, to stop the medication  If symptoms do not improve despite this, flexible sigmoidoscopy/colonoscopy can be considered  Doing a colonoscopy procedure at this time, without assessing if she improves with loperamide, is unlikely to change management given that symptoms are likely from XRT with C. difficile as a component instead of C. difficile being the primary reason for  her symptoms, and would place patient at risks of procedure in the setting of underlying inflammation likely from XRT   LOS: 5 days   Vanessa Antigua, MD 01/12/2021, 12:02 PM

## 2021-01-12 NOTE — Plan of Care (Signed)
PMT note:  Spoke with attending MD. Pain has improved and consult can be discontinued. Recommend outpatient follow up with the oncology symptom management clinic- Josh Borders NP.

## 2021-01-13 ENCOUNTER — Ambulatory Visit: Payer: Medicare Other

## 2021-01-13 LAB — CBC
HCT: 29.3 % — ABNORMAL LOW (ref 36.0–46.0)
Hemoglobin: 9.4 g/dL — ABNORMAL LOW (ref 12.0–15.0)
MCH: 26 pg (ref 26.0–34.0)
MCHC: 32.1 g/dL (ref 30.0–36.0)
MCV: 81.2 fL (ref 80.0–100.0)
Platelets: 166 10*3/uL (ref 150–400)
RBC: 3.61 MIL/uL — ABNORMAL LOW (ref 3.87–5.11)
WBC: 2.7 10*3/uL — ABNORMAL LOW (ref 4.0–10.5)
nRBC: 0 % (ref 0.0–0.2)

## 2021-01-13 LAB — BASIC METABOLIC PANEL
Anion gap: 4 — ABNORMAL LOW (ref 5–15)
BUN: 7 mg/dL (ref 6–20)
CO2: 28 mmol/L (ref 22–32)
Calcium: 8.4 mg/dL — ABNORMAL LOW (ref 8.9–10.3)
Chloride: 106 mmol/L (ref 98–111)
Creatinine, Ser: 0.46 mg/dL (ref 0.44–1.00)
GFR, Estimated: >60 mL/min (ref >60)
Glucose, Bld: 168 mg/dL — ABNORMAL HIGH (ref 70–99)
Potassium: 4.1 mmol/L (ref 3.5–5.1)
Sodium: 138 mmol/L (ref 135–145)

## 2021-01-13 LAB — GLUCOSE, CAPILLARY
Glucose-Capillary: 80 mg/dL (ref 70–99)
Glucose-Capillary: 109 mg/dL — ABNORMAL HIGH (ref 70–99)
Glucose-Capillary: 98 mg/dL (ref 70–99)
Glucose-Capillary: 84 mg/dL (ref 70–99)
Glucose-Capillary: 98 mg/dL (ref 70–99)
Glucose-Capillary: 108 mg/dL — ABNORMAL HIGH (ref 70–99)

## 2021-01-13 MED ORDER — TRAZODONE HCL 50 MG PO TABS
50.0000 mg | ORAL_TABLET | Freq: Every day | ORAL | Status: DC
  Administered 2021-01-13: 23:00:00 50 mg via ORAL
  Filled 2021-01-13: qty 1

## 2021-01-13 MED ORDER — MORPHINE SULFATE (PF) 2 MG/ML IV SOLN
2.0000 mg | INTRAVENOUS | Status: DC | PRN
  Administered 2021-01-13 – 2021-01-14 (×8): 2 mg via INTRAVENOUS
  Filled 2021-01-13 (×8): qty 1

## 2021-01-13 MED ORDER — DIPHENHYDRAMINE HCL 25 MG PO CAPS
50.0000 mg | ORAL_CAPSULE | Freq: Once | ORAL | Status: AC
  Administered 2021-01-13: 03:00:00 50 mg via ORAL
  Filled 2021-01-13: qty 2

## 2021-01-13 MED ORDER — DIPHENHYDRAMINE HCL 25 MG PO CAPS
25.0000 mg | ORAL_CAPSULE | Freq: Four times a day (QID) | ORAL | Status: DC | PRN
  Administered 2021-01-13: 11:00:00 25 mg via ORAL
  Filled 2021-01-13: qty 1

## 2021-01-13 MED ORDER — LOPERAMIDE HCL 2 MG PO CAPS
4.0000 mg | ORAL_CAPSULE | ORAL | Status: DC | PRN
  Administered 2021-01-13 (×2): 4 mg via ORAL
  Filled 2021-01-13 (×2): qty 2

## 2021-01-13 NOTE — Progress Notes (Signed)
PROGRESS NOTE    Vanessa Romero  PNT:[id number] DOB: Feb 04, 1975 DOA: 01/07/2021 PCP: Anselmo Pickler, MD   Assessment & Plan:   Principal Problem:   Colitis due to Clostridioides difficile Active Problems:   Cervical cancer, FIGO stage IIB (HCC)   Leukopenia due to antineoplastic chemotherapy ([facility])   Abdominal pain   Nausea & vomiting   Malnutrition of moderate degree   Progressive and severe c. diff enterocolitis: continues to have watery diarrhea and will likely still have diarrhea at d/c but hopefully decreased frequency. Continue on po vanco & IV flagyl. Increased imodium dose today, Continue on norco, fentanyl patch & d/c dilaudid and started IV morphine. Will need to f/u outpatient w/ GI    Leukopenia: likely secondary to recent chemo. Labile    Hx of cervical cancer: stage IIb as per pt. S/p chemo & radiation recently. Diarrhea and pain could be from her malignancy and radiation treatments as per onco   Hyponatremia: resolved   ACD: H&H are labile. Will transfuse if Hb < 7.0    Moderate malnutrition: secondary to chronic Illness. Continue on nutritional supplements     DVT prophylaxis: lovenox  Code Status: full  Family Communication:  Disposition Plan: d/c back home   Level of care: Med-Surg  Status is: Inpatient  Remains inpatient appropriate because: severity of illness, still having multiple episodes of diarrhea     Consultants:  GI  Procedures:   Antimicrobials: po vanco, IV flagyl    Subjective: Pt c/o diarrhea still   Objective: Vitals:   01/12/21 1627 01/12/21 2117 01/12/21 2118 01/13/21 0301  BP: 99/69 (!) 108/57 109/61 (!) 100/54  Pulse: 70 80 67 65  Resp: 16 16  18   Temp: 98.4 F (36.9 C) (!) 97.4 F (36.3 C)  98.4 F (36.9 C)  TempSrc:      SpO2: 100% 99%  98%  Weight:      Height:       No intake or output data in the 24 hours ending 01/13/21 0815  Filed Weights   01/07/21 1238 01/08/21 2104 01/10/21 1547   Weight: 54.4 kg 59.2 kg 65.1 kg    Examination:  General exam: Appears comfortable  Respiratory system: clear breath sounds b/l  Cardiovascular system: S1/S2+. No rubs or gallops  Gastrointestinal system: Abd is soft, ND, hyperactive bowel sounds  Central nervous system: Alert and oriented. Moves all extremities  Psychiatry: Judgement and insight appears normal. Flat mood and affect     Data Reviewed: I have personally reviewed following labs and imaging studies  CBC: Recent Labs  Lab 01/09/21 1145 01/10/21 0455 01/11/21 1150 01/12/21 0536 01/13/21 0437  WBC 2.0*   2.0* 2.5* 2.3* 3.1* 2.7*  NEUTROABS 1.1*  --  1.1*  --   --   HGB 9.3*   9.5* 9.4* 9.8* 10.2* 9.4*  HCT 29.3*   29.5* 29.2* 30.3* 32.5* 29.3*  MCV 80.1   79.7* 80.2 81.9 80.6 81.2  PLT 209   210 181 188 169 086   Basic Metabolic Panel: Recent Labs  Lab 01/08/21 0658 01/09/21 1145 01/10/21 0455 01/11/21 1223 01/12/21 0536 01/13/21 0437  NA 139 135 133* 139 137 138  K 4.0 4.1 3.5 4.3 4.2 4.1  CL 112* 108 107 111 105 106  CO2 22 24 23 25 26 28   GLUCOSE 74 90 102* 85 161* 168*  BUN 16 13 10  <5* 5* 7  CREATININE 0.53 0.56 0.53 0.46 0.51 0.46  CALCIUM 7.8* 8.0* 7.8* 8.2*  8.5* 8.4*  MG 1.8  --   --   --   --   --   PHOS 4.6  --   --   --   --   --    GFR: Estimated Creatinine Clearance: 87.3 mL/min (by C-G formula based on SCr of 0.46 mg/dL). Liver Function Tests: Recent Labs  Lab 01/06/21 1323 01/07/21 1240 01/08/21 0658  AST 26 20 17   ALT 17 15 13   ALKPHOS 38 41 35*  BILITOT 0.4 0.4 0.4  PROT 5.8* 5.9* 5.0*  ALBUMIN 3.2* 3.3* 2.7*   Recent Labs  Lab 01/06/21 1323 01/07/21 1240  LIPASE 25 27   No results for input(s): AMMONIA in the last 168 hours. Coagulation Profile: No results for input(s): INR, PROTIME in the last 168 hours. Cardiac Enzymes: No results for input(s): CKTOTAL, CKMB, CKMBINDEX, TROPONINI in the last 168 hours. BNP (last 3 results) No results for input(s): PROBNP in  the last 8760 hours. HbA1C: No results for input(s): HGBA1C in the last 72 hours. CBG: Recent Labs  Lab 01/12/21 1227 01/12/21 1626 01/12/21 2123 01/13/21 0434 01/13/21 0755  GLUCAP 116* 88 69* 80 109*   Lipid Profile: No results for input(s): CHOL, HDL, LDLCALC, TRIG, CHOLHDL, LDLDIRECT in the last 72 hours. Thyroid Function Tests: No results for input(s): TSH, T4TOTAL, FREET4, T3FREE, THYROIDAB in the last 72 hours. Anemia Panel: No results for input(s): VITAMINB12, FOLATE, FERRITIN, TIBC, IRON, RETICCTPCT in the last 72 hours. Sepsis Labs: Recent Labs  Lab 01/06/21 1537  LATICACIDVEN 0.7    Recent Results (from the past 240 hour(s))  C difficile quick screen w PCR reflex     Status: Abnormal   Collection Time: 01/04/21  1:58 PM   Specimen: STOOL  Result Value Ref Range Status   C Diff antigen POSITIVE (A) NEGATIVE Final   C Diff toxin NEGATIVE NEGATIVE Final   C Diff interpretation Results are indeterminate. See PCR results.  Final    Comment: Performed at [facility], [address] [place], [place] [address]  Gastrointestinal Panel by PCR , Stool     Status: None   Collection Time: 01/04/21  1:58 PM   Specimen: Stool  Result Value Ref Range Status   Campylobacter species NOT DETECTED NOT DETECTED Final   Plesimonas shigelloides NOT DETECTED NOT DETECTED Final   Salmonella species NOT DETECTED NOT DETECTED Final   Yersinia enterocolitica NOT DETECTED NOT DETECTED Final   Vibrio species NOT DETECTED NOT DETECTED Final   Vibrio cholerae NOT DETECTED NOT DETECTED Final   Enteroaggregative E coli (EAEC) NOT DETECTED NOT DETECTED Final   Enteropathogenic E coli (EPEC) NOT DETECTED NOT DETECTED Final   Enterotoxigenic E coli (ETEC) NOT DETECTED NOT DETECTED Final   Shiga like toxin producing E coli (STEC) NOT DETECTED NOT DETECTED Final   Shigella/Enteroinvasive E coli (EIEC) NOT DETECTED NOT DETECTED Final   Cryptosporidium NOT DETECTED NOT DETECTED Final    Cyclospora cayetanensis NOT DETECTED NOT DETECTED Final   Entamoeba histolytica NOT DETECTED NOT DETECTED Final   Giardia lamblia NOT DETECTED NOT DETECTED Final   Adenovirus F40/41 NOT DETECTED NOT DETECTED Final   Astrovirus NOT DETECTED NOT DETECTED Final   Norovirus GI/GII NOT DETECTED NOT DETECTED Final   Rotavirus A NOT DETECTED NOT DETECTED Final   Sapovirus (I, II, IV, and V) NOT DETECTED NOT DETECTED Final    Comment: Performed at [facility], [address] [place], [place] [address]  C. Diff by PCR,  Reflexed     Status: Abnormal   Collection Time: 01/04/21  1:58 PM  Result Value Ref Range Status   Toxigenic C. Difficile by PCR POSITIVE (A) NEGATIVE Final    Comment: Positive for toxigenic C. difficile with little to no toxin production. Only treat if clinical presentation suggests symptomatic illness. Performed at [facility], [address] [place], [place] [address]   Resp Panel by RT-PCR (Flu A&B, Covid) Nasopharyngeal Swab     Status: None   Collection Time: 01/06/21  2:38 PM   Specimen: Nasopharyngeal Swab; Nasopharyngeal(NP) swabs in vial transport medium  Result Value Ref Range Status   SARS Coronavirus 2 by RT PCR NEGATIVE NEGATIVE Final    Comment: (NOTE) SARS-CoV-2 target nucleic acids are NOT DETECTED.  The SARS-CoV-2 RNA is generally detectable in upper respiratory specimens during the acute phase of infection. The lowest concentration of SARS-CoV-2 viral copies this assay can detect is 138 copies/mL. A negative result does not preclude SARS-Cov-2 infection and should not be used as the sole basis for treatment or other patient management decisions. A negative result may occur with  improper specimen collection/handling, submission of specimen other than nasopharyngeal swab, presence of viral mutation(s) within the areas targeted by this assay, and inadequate number of viral copies(<138 copies/mL). A negative result must be combined  with clinical observations, patient history, and epidemiological information. The expected result is Negative.  Fact Sheet for Patients:  EntrepreneurPulse.com.au  Fact Sheet for Healthcare Providers:  IncredibleEmployment.be  This test is no t yet approved or cleared by the [place] FDA and  has been authorized for detection and/or diagnosis of SARS-CoV-2 by FDA under an Emergency Use Authorization (EUA). This EUA will remain  in effect (meaning this test can be used) for the duration of the COVID-19 declaration under Section 564(b)(1) of the Act, 21 U.S.C.section 360bbb-3(b)(1), unless the authorization is terminated  or revoked sooner.       Influenza A by PCR NEGATIVE NEGATIVE Final   Influenza B by PCR NEGATIVE NEGATIVE Final    Comment: (NOTE) The Xpert Xpress SARS-CoV-2/FLU/RSV plus assay is intended as an aid in the diagnosis of influenza from Nasopharyngeal swab specimens and should not be used as a sole basis for treatment. Nasal washings and aspirates are unacceptable for Xpert Xpress SARS-CoV-2/FLU/RSV testing.  Fact Sheet for Patients: EntrepreneurPulse.com.au  Fact Sheet for Healthcare Providers: IncredibleEmployment.be  This test is not yet approved or cleared by the [place] FDA and has been authorized for detection and/or diagnosis of SARS-CoV-2 by FDA under an Emergency Use Authorization (EUA). This EUA will remain in effect (meaning this test can be used) for the duration of the COVID-19 declaration under Section 564(b)(1) of the Act, 21 U.S.C. section 360bbb-3(b)(1), unless the authorization is terminated or revoked.  Performed at [facility], [address] [place], [place] [address]          Radiology Studies: No results found.      Scheduled Meds:  Chlorhexidine Gluconate Cloth  6 each Topical Daily   cholecalciferol  1,000 Units Oral Daily    enoxaparin (LOVENOX) injection  40 mg Subcutaneous Q24H   escitalopram  20 mg Oral Q1200   feeding supplement (KATE FARMS STANDARD 1.4)  325 mL Oral TID   fentaNYL  1 patch Transdermal K09F   folic acid  1 mg Oral Daily   lamoTRIgine  50 mg Oral Daily   liver oil-zinc oxide   Topical BID   multivitamin with  minerals  1 tablet Oral Daily   pantoprazole (PROTONIX) IV  40 mg Intravenous Q24H   sodium chloride flush  10-40 mL Intracatheter Q12H   sucralfate  1 g Oral TID PC   traZODone  50 mg Oral QHS   vancomycin  500 mg Oral QID   cyanocobalamin  1,000 mcg Oral Daily   Continuous Infusions:  dextrose 75 mL/hr at 01/12/21 2237   metronidazole 500 mg (01/13/21 0431)   promethazine (PHENERGAN) injection (IM or IVPB) 12.5 mg (01/12/21 2238)     LOS: 6 days    Time spent: 30 mins     Wyvonnia Dusky, MD [facility] Pager [phone]  If 7PM-7AM, please contact night-coverage 01/13/2021, 8:15 AM

## 2021-01-14 ENCOUNTER — Ambulatory Visit: Payer: Medicare Other

## 2021-01-14 DIAGNOSIS — R109 Unspecified abdominal pain: Secondary | ICD-10-CM | POA: Diagnosis not present

## 2021-01-14 DIAGNOSIS — A0472 Enterocolitis due to Clostridium difficile, not specified as recurrent: Secondary | ICD-10-CM | POA: Diagnosis not present

## 2021-01-14 LAB — CBC
HCT: 29.3 % — ABNORMAL LOW (ref 36.0–46.0)
Hemoglobin: 9.4 g/dL — ABNORMAL LOW (ref 12.0–15.0)
MCH: 26 pg (ref 26.0–34.0)
MCHC: 32.1 g/dL (ref 30.0–36.0)
MCV: 81.2 fL (ref 80.0–100.0)
Platelets: 151 10*3/uL (ref 150–400)
RBC: 3.61 MIL/uL — ABNORMAL LOW (ref 3.87–5.11)
WBC: 2.5 10*3/uL — ABNORMAL LOW (ref 4.0–10.5)
nRBC: 0 % (ref 0.0–0.2)

## 2021-01-14 LAB — GLUCOSE, CAPILLARY
Glucose-Capillary: 92 mg/dL (ref 70–99)
Glucose-Capillary: 78 mg/dL (ref 70–99)

## 2021-01-14 LAB — BASIC METABOLIC PANEL
Anion gap: 5 (ref 5–15)
BUN: 7 mg/dL (ref 6–20)
CO2: 27 mmol/L (ref 22–32)
Calcium: 8.1 mg/dL — ABNORMAL LOW (ref 8.9–10.3)
Chloride: 105 mmol/L (ref 98–111)
Creatinine, Ser: 0.45 mg/dL (ref 0.44–1.00)
GFR, Estimated: >60 mL/min (ref >60)
Glucose, Bld: 105 mg/dL — ABNORMAL HIGH (ref 70–99)
Potassium: 3.7 mmol/L (ref 3.5–5.1)
Sodium: 137 mmol/L (ref 135–145)

## 2021-01-14 MED ORDER — HEPARIN SOD (PORK) LOCK FLUSH 100 UNIT/ML IV SOLN
500.0000 [IU] | Freq: Once | INTRAVENOUS | Status: AC
  Administered 2021-01-14: 15:00:00 500 [IU] via INTRAVENOUS
  Filled 2021-01-14: qty 5

## 2021-01-14 MED ORDER — ALPRAZOLAM 0.5 MG PO TABS
0.5000 mg | ORAL_TABLET | Freq: Two times a day (BID) | ORAL | 0 refills | Status: DC | PRN
Start: 2021-01-14 — End: 2021-01-18

## 2021-01-14 MED ORDER — HYDROCODONE-ACETAMINOPHEN 10-325 MG PO TABS: 1.0000 | ORAL_TABLET | Freq: Four times a day (QID) | ORAL | 0 refills | Status: DC | PRN

## 2021-01-14 MED ORDER — VANCOMYCIN HCL 250 MG PO CAPS: 500.0000 mg | ORAL_CAPSULE | Freq: Four times a day (QID) | ORAL | 0 refills | Status: AC

## 2021-01-14 MED ORDER — FENTANYL 25 MCG/HR TD PT72
1.0000 | MEDICATED_PATCH | TRANSDERMAL | 0 refills | Status: DC
Start: 2021-01-15 — End: 2021-01-18

## 2021-01-14 NOTE — Care Management Important Message (Signed)
Important Message  Patient Details  Name: Vanessa Romero MRN: [id number] Date of Birth: 1975-11-20   Medicare Important Message Given:  Yes  Reviewed Medicare IM with patient via room phone due to isolation status.  Aware of Medicare right to appeal.  Declined copy at this time.    Dannette Barbara 01/14/2021, 11:37 AM

## 2021-01-14 NOTE — Progress Notes (Signed)
Pt discharged per MD order. Port deaccessed and hep locked. Discharge instructions reviewed with pt. Pt verbalized understanding with all questions answered to pt satisfaction. Pt taken to car in wheelchair via staff.

## 2021-01-14 NOTE — Progress Notes (Signed)
Vonda Antigua, MD [address] [place], [place], [place], [address] [address], [place], [place], [place], [address] Phone: [phone]  Fax: [phone]   Subjective: Patient seen and examined today and reports improvement in diarrhea with Imodium.  States it is more manageable now and is less frequent and has become more formed.  No further blood in stool.   Objective: Exam: Vital signs in last 24 hours: Vitals:   01/13/21 1937 01/14/21 0330 01/14/21 0524 01/14/21 0802  BP: 111/66 110/71 113/75 (!) 92/46  Pulse: 96 69 71 70  Resp: 20 18  18   Temp: 99.3 F (37.4 C) 98.1 F (36.7 C) 97.8 F (36.6 C)   TempSrc:      SpO2: 100% 100% 100% 96%  Weight:      Height:       Weight change:   Intake/Output Summary (Last 24 hours) at 01/14/2021 1314 Last data filed at 01/14/2021 0102 Gross per 24 hour  Intake 240 ml  Output --  Net 240 ml    Constitutional: General:   Alert,  Well-developed, well-nourished, pleasant and cooperative in NAD BP (!) 92/46 (BP Location: Right Arm)    Pulse 70    Temp 97.8 F (36.6 C)    Resp 18    Ht 5' 7.01" (1.702 m)    Wt 65.1 kg    LMP 01/04/2021    SpO2 96%    BMI 22.47 kg/m   Eyes:  Sclera clear, no icterus.   Conjunctiva pink.   Ears:  No scars, lesions or masses, Normal auditory acuity. Nose:  No deformity, discharge, or lesions. Mouth:  No deformity or lesions, oropharynx pink & moist.  Neck:  Supple; no masses, trachea midline  Respiratory: Normal respiratory effort  Gastrointestinal:  Soft, non-tender and non-distended without masses.  No guarding or rebound tenderness.     Cardiac: No clubbing or edema.  No cyanosis. Normal posterior tibial pedal pulses noted.  Lymphatic:  No significant cervical adenopathy.  Psych:  Alert and cooperative. Normal mood and affect.  Musculoskeletal:  Head normocephalic, atraumatic. 5/5 Lower extremity strength bilaterally.  Skin: Warm. Intact without significant lesions or  rashes. No jaundice.  Neurologic:  Face symmetrical, tongue midline, Normal sensation to touch  Psych:  Alert and oriented x3, Alert and cooperative. Normal mood and affect.   Lab Results: Lab Results  Component Value Date   WBC 2.5 (L) 01/14/2021   HGB 9.4 (L) 01/14/2021   HCT 29.3 (L) 01/14/2021   MCV 81.2 01/14/2021   PLT 151 01/14/2021   Micro Results: Recent Results (from the past 240 hour(s))  C difficile quick screen w PCR reflex     Status: Abnormal   Collection Time: 01/04/21  1:58 PM   Specimen: STOOL  Result Value Ref Range Status   C Diff antigen POSITIVE (A) NEGATIVE Final   C Diff toxin NEGATIVE NEGATIVE Final   C Diff interpretation Results are indeterminate. See PCR results.  Final    Comment: Performed at [facility], [address] [place], [place] [address]  Gastrointestinal Panel by PCR , Stool     Status: None   Collection Time: 01/04/21  1:58 PM   Specimen: Stool  Result Value Ref Range Status   Campylobacter species NOT DETECTED NOT DETECTED Final   Plesimonas shigelloides NOT DETECTED NOT DETECTED Final   Salmonella species NOT DETECTED NOT DETECTED Final   Yersinia enterocolitica NOT DETECTED NOT DETECTED Final   Vibrio species NOT DETECTED  NOT DETECTED Final   Vibrio cholerae NOT DETECTED NOT DETECTED Final   Enteroaggregative E coli (EAEC) NOT DETECTED NOT DETECTED Final   Enteropathogenic E coli (EPEC) NOT DETECTED NOT DETECTED Final   Enterotoxigenic E coli (ETEC) NOT DETECTED NOT DETECTED Final   Shiga like toxin producing E coli (STEC) NOT DETECTED NOT DETECTED Final   Shigella/Enteroinvasive E coli (EIEC) NOT DETECTED NOT DETECTED Final   Cryptosporidium NOT DETECTED NOT DETECTED Final   Cyclospora cayetanensis NOT DETECTED NOT DETECTED Final   Entamoeba histolytica NOT DETECTED NOT DETECTED Final   Giardia lamblia NOT DETECTED NOT DETECTED Final   Adenovirus F40/41 NOT DETECTED NOT DETECTED Final   Astrovirus NOT DETECTED NOT  DETECTED Final   Norovirus GI/GII NOT DETECTED NOT DETECTED Final   Rotavirus A NOT DETECTED NOT DETECTED Final   Sapovirus (I, II, IV, and V) NOT DETECTED NOT DETECTED Final    Comment: Performed at [facility], [address] [place], [place] [address]  C. Diff by PCR, Reflexed     Status: Abnormal   Collection Time: 01/04/21  1:58 PM  Result Value Ref Range Status   Toxigenic C. Difficile by PCR POSITIVE (A) NEGATIVE Final    Comment: Positive for toxigenic C. difficile with little to no toxin production. Only treat if clinical presentation suggests symptomatic illness. Performed at [facility], [address] [place], [place] [address]   Resp Panel by RT-PCR (Flu A&B, Covid) Nasopharyngeal Swab     Status: None   Collection Time: 01/06/21  2:38 PM   Specimen: Nasopharyngeal Swab; Nasopharyngeal(NP) swabs in vial transport medium  Result Value Ref Range Status   SARS Coronavirus 2 by RT PCR NEGATIVE NEGATIVE Final    Comment: (NOTE) SARS-CoV-2 target nucleic acids are NOT DETECTED.  The SARS-CoV-2 RNA is generally detectable in upper respiratory specimens during the acute phase of infection. The lowest concentration of SARS-CoV-2 viral copies this assay can detect is 138 copies/mL. A negative result does not preclude SARS-Cov-2 infection and should not be used as the sole basis for treatment or other patient management decisions. A negative result may occur with  improper specimen collection/handling, submission of specimen other than nasopharyngeal swab, presence of viral mutation(s) within the areas targeted by this assay, and inadequate number of viral copies(<138 copies/mL). A negative result must be combined with clinical observations, patient history, and epidemiological information. The expected result is Negative.  Fact Sheet for Patients:  EntrepreneurPulse.com.au  Fact Sheet for Healthcare Providers:   IncredibleEmployment.be  This test is no t yet approved or cleared by the [place] FDA and  has been authorized for detection and/or diagnosis of SARS-CoV-2 by FDA under an Emergency Use Authorization (EUA). This EUA will remain  in effect (meaning this test can be used) for the duration of the COVID-19 declaration under Section 564(b)(1) of the Act, 21 U.S.C.section 360bbb-3(b)(1), unless the authorization is terminated  or revoked sooner.       Influenza A by PCR NEGATIVE NEGATIVE Final   Influenza B by PCR NEGATIVE NEGATIVE Final    Comment: (NOTE) The Xpert Xpress SARS-CoV-2/FLU/RSV plus assay is intended as an aid in the diagnosis of influenza from Nasopharyngeal swab specimens and should not be used as a sole basis for treatment. Nasal washings and aspirates are unacceptable for Xpert Xpress SARS-CoV-2/FLU/RSV testing.  Fact Sheet for Patients: EntrepreneurPulse.com.au  Fact Sheet for Healthcare Providers: IncredibleEmployment.be  This test is not yet approved or cleared by the [place] FDA  and has been authorized for detection and/or diagnosis of SARS-CoV-2 by FDA under an Emergency Use Authorization (EUA). This EUA will remain in effect (meaning this test can be used) for the duration of the COVID-19 declaration under Section 564(b)(1) of the Act, 21 U.S.C. section 360bbb-3(b)(1), unless the authorization is terminated or revoked.  Performed at [facility], [address], [place], [place] [address]    Studies/Results: No results found. Medications:  Scheduled Meds:  Chlorhexidine Gluconate Cloth  6 each Topical Daily   cholecalciferol  1,000 Units Oral Daily   enoxaparin (LOVENOX) injection  40 mg Subcutaneous Q24H   escitalopram  20 mg Oral Q1200   feeding supplement (KATE FARMS STANDARD 1.4)  325 mL Oral TID   fentaNYL  1 patch Transdermal R48N   folic acid  1 mg Oral Daily    lamoTRIgine  50 mg Oral Daily   liver oil-zinc oxide   Topical BID   multivitamin with minerals  1 tablet Oral Daily   pantoprazole (PROTONIX) IV  40 mg Intravenous Q24H   sodium chloride flush  10-40 mL Intracatheter Q12H   sucralfate  1 g Oral TID PC   traZODone  50 mg Oral QHS   vancomycin  500 mg Oral QID   cyanocobalamin  1,000 mcg Oral Daily   Continuous Infusions:  metronidazole 500 mg (01/14/21 1155)   promethazine (PHENERGAN) injection (IM or IVPB) 12.5 mg (01/12/21 2238)   PRN Meds:.ALPRAZolam, dextrose, diphenhydrAMINE, HYDROcodone-acetaminophen, loperamide, morphine injection, promethazine (PHENERGAN) injection (IM or IVPB), sodium chloride flush   Assessment: Principal Problem:   Colitis due to Clostridioides difficile Active Problems:   Cervical cancer, FIGO stage IIB (HCC)   Leukopenia due to antineoplastic chemotherapy (HCC)   Abdominal pain   Nausea & vomiting   Malnutrition of moderate degree    Plan: Improvement in diarrhea with Imodium is reassuring  Diarrhea was likely primarily due to adverse effects enteritis/colitis from XRT, with a component of possible C. Difficile  Patient C. difficile toxin was negative and antigen was positive and in the setting of diarrhea, treatment was started but testing by itself was not convincing of primarily C. difficile induced diarrhea  Since diarrhea is improving at this time, no indication for colonoscopy as it would place patient at risks of procedure (such as perforation) in the setting of inflammation from her cancer treatment  Patient advised to start decreasing Imodium dosing once diarrhea starts to improve, to avoid constipation and she verbalized understanding.  Patient states she has close follow-up with oncology scheduled for next week already  Follow-up with [place] [facility] in 2 to 3 weeks as well  If diarrhea reoccurs, patient advised to reach out to [place] [facility], PMD or oncology  GI service  will sign off at this time, please page with any questions or concerns or changes in symptoms   LOS: 7 days   Vonda Antigua, MD 01/14/2021, 1:14 PM

## 2021-01-14 NOTE — Progress Notes (Signed)
Nutrition Follow Up Note   DOCUMENTATION CODES:   Non-severe (moderate) malnutrition in context of social or environmental circumstances  INTERVENTION:   [employer] [place] Standard 1.0 (chocolate)- provide supplement three times daily po- provides 325kcal and 16g protein per serving.   MVI po daily   Cholecalciferol 1000 units po daily   Pt at high refeed risk; recommend monitor potassium, magnesium and phosphorus labs daily until stable  NUTRITION DIAGNOSIS:   Moderate Malnutrition related to social / environmental circumstances as evidenced by moderate fat depletion, moderate muscle depletion.  GOAL:   Patient will meet greater than or equal to 90% of their needs -progressing   MONITOR:   PO intake, Supplement acceptance, Labs, Weight trends, Skin, I & O's  ASSESSMENT:   45 y/o female with h/o etoh abuse, chronic diarrhea, roux-en-y gatric bypass 2010, LVH, intracranial hypertension since 2007 status post 2 VP shunt placements last of which was removed in May 2012 secondary to meningitis, chronic headaches, seizures, depression and recently diagnosed stage IIb cervical carcinoma currently undergoing chemotherapy and radiotherapy who is now admitted with C. diff colitis.  Met with pt in room 12/29. Pt reports that she is feeling better but is just tired. Pt reports that she likes the chocolate [employer] supplements and has ordered a case for at home. Pt has been drinking three per day. Pt has had fair appetite and oral intake in hospital. Pt continues to have diarrhea which GI reports if likely from chemo/radiation and not from C-diff. Recommend continue supplements and vitamins. Pt remains at high refeed risk. No new weight since 12/26; will request daily weights.   Medications reviewed and include: D3, lovenox, folic acid, MVI, protonix, carafate, vancomycin, B12, metronidazole  Labs reviewed: K 3.7 wnl Wbc- 2.5(L), Hgb 9.4(L), Hct 29.3(L) Cbgs- 92, 108, 98, 84 x 48 hrs  Diet  Order:   Diet Order             DIET SOFT Room service appropriate? Yes; Fluid consistency: Thin  Diet effective now                  EDUCATION NEEDS:   Education needs have been addressed  Skin:  Skin Assessment: Reviewed RN Assessment  Last BM:  12/26- type 7  Height:   Ht Readings from Last 1 Encounters:  01/08/21 5' 7.01" (1.702 m)    Weight:   Wt Readings from Last 1 Encounters:  01/10/21 65.1 kg    Ideal Body Weight:  61.36 kg  BMI:  Body mass index is 22.47 kg/m.  Estimated Nutritional Needs:   Kcal:  1800-2100kcal/day  Protein:  90-105g/day  Fluid:  1.8-2.1L/day  Koleen Distance MS, RD, LDN Please refer to [facility] for RD and/or RD on-call/weekend/after hours pager

## 2021-01-14 NOTE — Discharge Summary (Signed)
Physician Discharge Summary  Vanessa Romero VZC:[id number] DOB: 07/16/1975 DOA: 01/07/2021  PCP: Anselmo Pickler, MD  Admit date: 01/07/2021 Discharge date: 01/14/2021  Admitted From: home  Disposition: home   Recommendations for Outpatient Follow-up:  Follow up with PCP in 1-2 weeks F/u w/ [facility] in 2-3 weeks  F/u w/ onco within 1 week   Home Health: no  Equipment/Devices:  Discharge Condition: stable  CODE STATUS: full  Diet recommendation: regular   Brief/Interim Summary: HPI was taken from Dr. Josephine Cables: Vanessa Romero is a 45 y.o. female with medical history significant for cervical carcinoma currently undergoing chemotherapy and radiotherapy, gastric bypass, intracranial hypertension who presents to the emergency department due to about 5-day onset of abdominal pain, nausea, vomiting and diarrhea.  Patient tested positive for C. difficile with no C. difficile toxin per patient's oncologist medical record, but they wanted patient to be treated for an active infection.  Abdominal pain was sharp and burning and was rated as 7-8/10 on pain scale, it was intermittent and there was no alleviating/aggravating factor.  Patient states that she has not been able to tolerate any oral intake due to vomiting.  She denies fever, chest pain, shortness of breath, headache. She presented to the ED yesterday and she was going to be admitted, however, she preferred to take her medications at home, unfortunately, the abdominal pain was too intense and also due to the recurrent nausea vomiting.  She returned today for further evaluation and management.   ED Course:  In the emergency department, she was hemodynamically stable.  Work-up in the ED showed leukopenia, normocytic anemia, hypoglycemia, troponin x1 was negative, urinalysis was negative.  Influenza A, B, SARS coronavirus 2 was negative.  Full GI panel done by oncology was negative except for C. difficile without the C.  difficile toxin CT abdomen and pelvis with contrast showed mild to moderate severity infectious or inflammatory colitis involving the descending and sigmoid colon Patient was started on vancomycin, Toradol and Dilaudid were given.  An IV hydration was provided.  Hospitalist was asked to admit patient for further evaluation and management.   As per Dr. Karleen Hampshire:  Vanessa Romero is a 45 y.o. female with medical history significant for cervical carcinoma currently undergoing chemotherapy and radiotherapy, gastric bypass, intracranial hypertension who presents to the emergency department due to about 5-day onset of abdominal pain, nausea, vomiting and diarrhea. Full GI panel done by oncology was negative except for C. difficile without the C. difficile toxin. CT abdomen and pelvis with contrast showed mild to moderate severity infectious or inflammatory colitis involving the descending and sigmoid colon Patient was started on vancomycin and symptomatic management with IV fluids and pain control.  In view of worsening pain , nausea  and the fact that pt is requiring IV dilaudid every 2 hours.  Repeat CT abdomen showed progressive and now severe inflammation of distal small bowel in the pelvis, including the terminal ileum. And ongoing distal colitis. Suspect Progressive Clostridium Difficile Enterocolitis in this setting. No pneumoperitoneum or pneumatosis identified.   GI consulted for further evaluation, suggested both chemo and XRT can be   As per Dr. Jimmye Norman 12/28-12/30/22: Pt continued to have diarrhea but it improved w/ imodium use as per GI recs. Pt was educated on importance of decreasing imodium use as diarrhea has improved, to avoid constipation. Pt verbalized her understanding.  For more information, please see previous progress/consult notes.   Discharge Diagnoses:  Principal Problem:   Colitis due to Clostridioides difficile  Active Problems:   Cervical cancer, FIGO stage IIB (HCC)    Leukopenia due to antineoplastic chemotherapy (HCC)   Abdominal pain   Nausea & vomiting   Malnutrition of moderate degree  Progressive and severe c. diff enterocolitis: continues to have watery diarrhea and still has diarrhea but decreased frequency. Continue on po vanco and prn imodium at d/c as per GI.  Continue on norco, fentanyl patch.   Anxiety: severity unknown. Xanax prn    Leukopenia: likely secondary to recent chemo. Labile    Hx of cervical cancer: stage IIb as per pt. S/p chemo & radiation recently. Diarrhea and pain could be from her malignancy and radiation treatments as per onco   Hyponatremia: resolved   ACD: H&H are labile. Will transfuse if Hb < 7.0    Moderate malnutrition: secondary to chronic Illness. Continue on nutritional supplements   Discharge Instructions  Discharge Instructions     Diet general   Complete by: As directed    Discharge instructions   Complete by: As directed    F/u w/ PCP 1-2 weeks. F/u w/ onco within 1 week   Increase activity slowly   Complete by: As directed       Allergies as of 01/14/2021       Reactions   Contrast Media [iodinated Contrast Media] Hives   Gabapentin Other (See Comments), Rash, Palpitations   Other Reaction: tachycardia Other Reaction: tachycardia   Morphine Dermatitis, Hives, Rash, Swelling, Other (See Comments)   Other reaction(s): Unknown (comments) Has tolerated hydromorphone (Dilaudid) Immediate after injections arm edema and arm turned bright red Immediate after injections arm edema and arm turned bright red IV Morphine IV Morphine   Sumatriptan Dermatitis, Hives, Itching, Other (See Comments), Swelling   Other reaction(s): Joint Pain, Other (See Comments), Other (see comments), Unknown (comments) lock jaw Lock jaw Lock jaw Lock jaw TIGHTENING OF JAW Lock jaw lock jaw Lock jaw TIGHTENING OF JAW Lock jaw   Zolpidem Nausea And Vomiting, Other (See Comments)   Other reaction(s): Other (see  comments) sleep walking sleep walking Sleep walking  don't tolerate it well   Erythromycin Diarrhea, Nausea And Vomiting, Nausea Only   Extreme upset stomach   Valproic Acid Rash   Other reaction(s): Other (see comments), Unknown MOOD DISORDER MOOD DISORDER Depakote: Reaction unknown    Acetazolamide    Other reaction(s): Unknown (comments)   Erythromycin Base    Other reaction(s): UNKNOWN   Amoxicillin Rash   Divalproex Sodium Anxiety, Other (See Comments)        Medication List     STOP taking these medications    HYDROcodone-acetaminophen 5-325 MG tablet Commonly known as: NORCO/VICODIN Replaced by: HYDROcodone-acetaminophen 10-325 MG tablet   traMADol 50 MG tablet Commonly known as: ULTRAM       TAKE these medications    ALPRAZolam 0.5 MG tablet Commonly known as: XANAX Take 1 tablet (0.5 mg total) by mouth 2 (two) times daily as needed for up to 5 days for anxiety.   cyanocobalamin 1000 MCG tablet Take 1 tablet (1,000 mcg total) by mouth daily.   dicyclomine 10 MG capsule Commonly known as: BENTYL Take 1 capsule (10 mg total) by mouth 3 (three) times daily as needed (abd pain).   escitalopram 20 MG tablet Commonly known as: LEXAPRO Take 1 tablet (20 mg total) by mouth daily at 12 noon.   feeding supplement Liqd Take 237 mLs by mouth 3 (three) times daily between meals.   fentaNYL 25 MCG/HR Commonly known  as: [place] 1 patch onto the skin every 3 (three) days for 5 days. Start taking on: January 16, 5955   folic acid 1 MG tablet Commonly known as: FOLVITE Take 1 tablet (1 mg total) by mouth daily.   HYDROcodone-acetaminophen 10-325 MG tablet Commonly known as: Norco Take 1 tablet by mouth every 6 (six) hours as needed for up to 5 days for moderate pain or severe pain. Replaces: HYDROcodone-acetaminophen 5-325 MG tablet   lamoTRIgine 25 MG tablet Commonly known as: LAMICTAL Take 2 tablets (50 mg total) by mouth daily.    lidocaine-prilocaine cream Commonly known as: EMLA Apply to affected area once   multivitamin with minerals Tabs tablet Take 1 tablet by mouth daily.   ondansetron 8 MG tablet Commonly known as: Zofran Take 1 tablet (8 mg total) by mouth 2 (two) times daily as needed for refractory nausea / vomiting.   prochlorperazine 10 MG tablet Commonly known as: COMPAZINE Take 1 tablet (10 mg total) by mouth every 6 (six) hours as needed (Nausea or vomiting).   sucralfate 1 g tablet Commonly known as: Carafate Take 1 tablet (1 g total) by mouth 3 (three) times daily.   thiamine 100 MG tablet Take 1 tablet (100 mg total) by mouth daily.   vancomycin 250 MG capsule Commonly known as: VANCOCIN Take 2 capsules (500 mg total) by mouth 4 (four) times daily for 5 days. What changed:  medication strength how much to take   Vitamin D3 25 MCG tablet Commonly known as: Vitamin D Take 1 tablet (1,000 Units total) by mouth daily.        Allergies  Allergen Reactions   Contrast Media [Iodinated Contrast Media] Hives   Gabapentin Other (See Comments), Rash and Palpitations    Other Reaction: tachycardia Other Reaction: tachycardia    Morphine Dermatitis, Hives, Rash, Swelling and Other (See Comments)    Other reaction(s): Unknown (comments) Has tolerated hydromorphone (Dilaudid) Immediate after injections arm edema and arm turned bright red Immediate after injections arm edema and arm turned bright red IV Morphine IV Morphine    Sumatriptan Dermatitis, Hives, Itching, Other (See Comments) and Swelling    Other reaction(s): Joint Pain, Other (See Comments), Other (see comments), Unknown (comments) lock jaw Lock jaw Lock jaw Lock jaw TIGHTENING OF JAW Lock jaw lock jaw Lock jaw TIGHTENING OF JAW Lock jaw    Zolpidem Nausea And Vomiting and Other (See Comments)    Other reaction(s): Other (see comments) sleep walking sleep walking Sleep walking  don't tolerate it well     Erythromycin Diarrhea, Nausea And Vomiting and Nausea Only    Extreme upset stomach    Valproic Acid Rash    Other reaction(s): Other (see comments), Unknown MOOD DISORDER MOOD DISORDER Depakote: Reaction unknown     Acetazolamide     Other reaction(s): Unknown (comments)   Erythromycin Base     Other reaction(s): UNKNOWN   Amoxicillin Rash   Divalproex Sodium Anxiety and Other (See Comments)    Consultations:  GI   Procedures/Studies: CT ABDOMEN PELVIS WO CONTRAST  Result Date: 01/09/2021 CLINICAL DATA:  45 year old female with increasing abdominal pain. Distal colitis on CT several days ago. Cervical cancer. Positive labs for for C difficile on 01/05/2021. EXAM: CT ABDOMEN AND PELVIS WITHOUT CONTRAST TECHNIQUE: Multidetector CT imaging of the abdomen and pelvis was performed following the standard protocol without IV contrast. COMPARISON:  CT Abdomen and Pelvis 01/06/2021. FINDINGS: Lower chest: Negative. Hepatobiliary: Absent gallbladder. Negative noncontrast liver aside from  trace inferior perihepatic free fluid with simple fluid density on series 2, image 43. Pancreas: Negative noncontrast pancreas. Spleen: Negative. Adrenals/Urinary Tract: Normal adrenal glands. Noncontrast kidneys appear stable and nonobstructed. Bladder seems to remain normal. Incidental pelvic phleboliths. Stomach/Bowel: Previous Roux-en-Y type gastric bypass. No dilated small or large bowel loops, but distal small bowel in the anterior pelvis appears severely inflamed (series 2, image 67) with pronounced wall thickening bowel wall thickening and edema continuing to the terminal ileum and ileocecal valve (image 59). Right: And transverse colon appear within normal limits. Splenic flexure within normal limits. Gradual transition in the distal descending colon 2 inflamed sigmoid and rectum with circumferential wall thickening. Oval roughly 10 mm capsule appears impacted at the gastroesophageal junction on series 2,  image 13, but there is no upstream esophageal dilatation evident. Bypassed portion of the stomach contains a small volume of fluid as before. Small volume of fluid also in the duodenum. No free air. There is trace free fluid in the abdomen. Vascular/Lymphatic: Mild Calcified aortic atherosclerosis. Normal caliber abdominal aorta. Vascular patency is not evaluated in the absence of IV contrast. Reproductive: Retroverted uterus as before. Otherwise negative noncontrast appearance. Other: Pelvic mesenteric edema but no definite pelvic free fluid. Musculoskeletal: No acute osseous abnormality identified. IMPRESSION: 1. Progressive and now severe inflammation of distal small bowel in the pelvis, including the terminal ileum. And ongoing distal colitis. Suspect Progressive Clostridium Difficile Enterocolitis in this setting. No pneumoperitoneum or pneumatosis identified. Trace free fluid. 2. Suspect impacted tablet or capsule at the GEJ. But no evidence of associated esophageal obstruction. 3. No other acute or inflammatory process identified on noncontrast CT abdomen and pelvis. Electronically Signed   By: Genevie Ann M.D.   On: 01/09/2021 10:54   CT ABDOMEN PELVIS W CONTRAST  Result Date: 01/06/2021 CLINICAL DATA:  Abdominal cramping and diarrhea. EXAM: CT ABDOMEN AND PELVIS WITH CONTRAST TECHNIQUE: Multidetector CT imaging of the abdomen and pelvis was performed using the standard protocol following bolus administration of intravenous contrast. CONTRAST:  76mL OMNIPAQUE IOHEXOL 300 MG/ML  SOLN COMPARISON:  None. FINDINGS: Lower chest: No acute abnormality. Hepatobiliary: No focal liver abnormality is seen. Status post cholecystectomy. Common bile duct is dilated and measures 1.4 cm. Pancreas: Unremarkable. No pancreatic ductal dilatation or surrounding inflammatory changes. Spleen: Normal in size without focal abnormality. Adrenals/Urinary Tract: Adrenal glands are unremarkable. Kidneys are normal, without renal  calculi, focal lesion, or hydronephrosis. The urinary bladder is partially empty and subsequently limited in evaluation. Stomach/Bowel: Surgical sutures are seen within the gastric region. Appendix appears normal. No evidence of bowel dilatation. Mild to moderate severity diffuse colonic wall thickening is seen throughout the descending and sigmoid colon. Vascular/Lymphatic: Very mild aortic atherosclerosis. No enlarged abdominal or pelvic lymph nodes. Reproductive: The uterus is unremarkable. A 2.1 cm diameter cyst is seen along the posterior aspect of the right adnexa. Other: No abdominal wall hernia or abnormality. No abdominopelvic ascites. Musculoskeletal: No acute or significant osseous findings. IMPRESSION: 1. Mild to moderate severity infectious or inflammatory colitis involving the descending and sigmoid colon. 2. Evidence of prior cholecystectomy. 3. 2.1 cm diameter right adnexal cyst, likely ovarian in origin. 4. Very mild aortic atherosclerosis. Aortic Atherosclerosis (ICD10-I70.0). Electronically Signed   By: Virgina Norfolk M.D.   On: 01/06/2021 19:52   PERIPHERAL VASCULAR CATHETERIZATION  Result Date: 12/20/2020 See surgical note for result.  DG Abd 2 Views  Result Date: 01/05/2021 CLINICAL DATA:  45 year old female with abdominal pain. Recent diarrhea and constipation. Cervical cancer  EXAM: ABDOMEN - 2 VIEW COMPARISON:  PET-CT 12/06/2020. FINDINGS: Upright and supine views of the abdomen and pelvis. Negative lung bases. No pneumoperitoneum. Stable cholecystectomy clips. Non obstructed bowel gas pattern. Moderate volume of retained stool in the colon. Recent PET-CT with Roux-en-Y type gastric bypass. No acute osseous abnormality identified. Pelvic phleboliths. IMPRESSION: Nonobstructed bowel-gas pattern with moderate volume of retained stool. Prior gastric bypass. Electronically Signed   By: Genevie Ann M.D.   On: 01/05/2021 11:24   DG ABD ACUTE 2+V W 1V CHEST  Result Date:  01/11/2021 CLINICAL DATA:  Right-sided abdominal pain. EXAM: DG ABDOMEN ACUTE WITH 1 VIEW CHEST COMPARISON:  January 05, 2021 FINDINGS: There is no evidence of dilated bowel loops or free intraperitoneal air. No radiopaque calculi or other significant radiographic abnormality is seen. Radiopaque surgical clips are seen within the right upper quadrant, with radiopaque surgical sutures noted along the medial aspect of the left upper quadrant. Subcentimeter phleboliths are noted within the lower pelvis. A right-sided venous Port-A-Cath is seen with its distal tip noted at the junction of the superior vena cava and right atrium. Heart size and mediastinal contours are within normal limits. Both lungs are clear. IMPRESSION: Negative abdominal radiographs.  No acute cardiopulmonary disease. Electronically Signed   By: Virgina Norfolk M.D.   On: 01/11/2021 04:08   (Echo, Carotid, EGD, Colonoscopy, ERCP)    Subjective: Pt c/o diarrhea but improved from day prior    Discharge Exam: Vitals:   01/14/21 0524 01/14/21 0802  BP: 113/75 (!) 92/46  Pulse: 71 70  Resp:  18  Temp: 97.8 F (36.6 C)   SpO2: 100% 96%   Vitals:   01/13/21 1937 01/14/21 0330 01/14/21 0524 01/14/21 0802  BP: 111/66 110/71 113/75 (!) 92/46  Pulse: 96 69 71 70  Resp: 20 18  18   Temp: 99.3 F (37.4 C) 98.1 F (36.7 C) 97.8 F (36.6 C)   TempSrc:      SpO2: 100% 100% 100% 96%  Weight:      Height:        General: Pt is alert, awake, not in acute distress Cardiovascular:  S1/S2 +, no rubs, no gallops Respiratory: CTA bilaterally, no wheezing, no rhonchi Abdominal: Soft, NT, ND, hyperactive bowel sounds  Extremities: no edema, no cyanosis    The results of significant diagnostics from this hospitalization (including imaging, microbiology, ancillary and laboratory) are listed below for reference.     Microbiology: Recent Results (from the past 240 hour(s))  C difficile quick screen w PCR reflex     Status: Abnormal    Collection Time: 01/04/21  1:58 PM   Specimen: STOOL  Result Value Ref Range Status   C Diff antigen POSITIVE (A) NEGATIVE Final   C Diff toxin NEGATIVE NEGATIVE Final   C Diff interpretation Results are indeterminate. See PCR results.  Final    Comment: Performed at [facility], [address] [place], [place] [address]  Gastrointestinal Panel by PCR , Stool     Status: None   Collection Time: 01/04/21  1:58 PM   Specimen: Stool  Result Value Ref Range Status   Campylobacter species NOT DETECTED NOT DETECTED Final   Plesimonas shigelloides NOT DETECTED NOT DETECTED Final   Salmonella species NOT DETECTED NOT DETECTED Final   Yersinia enterocolitica NOT DETECTED NOT DETECTED Final   Vibrio species NOT DETECTED NOT DETECTED Final   Vibrio cholerae NOT DETECTED NOT DETECTED Final   Enteroaggregative E coli (EAEC) NOT DETECTED NOT  DETECTED Final   Enteropathogenic E coli (EPEC) NOT DETECTED NOT DETECTED Final   Enterotoxigenic E coli (ETEC) NOT DETECTED NOT DETECTED Final   Shiga like toxin producing E coli (STEC) NOT DETECTED NOT DETECTED Final   Shigella/Enteroinvasive E coli (EIEC) NOT DETECTED NOT DETECTED Final   Cryptosporidium NOT DETECTED NOT DETECTED Final   Cyclospora cayetanensis NOT DETECTED NOT DETECTED Final   Entamoeba histolytica NOT DETECTED NOT DETECTED Final   Giardia lamblia NOT DETECTED NOT DETECTED Final   Adenovirus F40/41 NOT DETECTED NOT DETECTED Final   Astrovirus NOT DETECTED NOT DETECTED Final   Norovirus GI/GII NOT DETECTED NOT DETECTED Final   Rotavirus A NOT DETECTED NOT DETECTED Final   Sapovirus (I, II, IV, and V) NOT DETECTED NOT DETECTED Final    Comment: Performed at [facility], [address] [place], [place] [address]  C. Diff by PCR, Reflexed     Status: Abnormal   Collection Time: 01/04/21  1:58 PM  Result Value Ref Range Status   Toxigenic C. Difficile by PCR POSITIVE (A) NEGATIVE Final    Comment: Positive for  toxigenic C. difficile with little to no toxin production. Only treat if clinical presentation suggests symptomatic illness. Performed at [facility], [address] [place], [place] [address]   Resp Panel by RT-PCR (Flu A&B, Covid) Nasopharyngeal Swab     Status: None   Collection Time: 01/06/21  2:38 PM   Specimen: Nasopharyngeal Swab; Nasopharyngeal(NP) swabs in vial transport medium  Result Value Ref Range Status   SARS Coronavirus 2 by RT PCR NEGATIVE NEGATIVE Final    Comment: (NOTE) SARS-CoV-2 target nucleic acids are NOT DETECTED.  The SARS-CoV-2 RNA is generally detectable in upper respiratory specimens during the acute phase of infection. The lowest concentration of SARS-CoV-2 viral copies this assay can detect is 138 copies/mL. A negative result does not preclude SARS-Cov-2 infection and should not be used as the sole basis for treatment or other patient management decisions. A negative result may occur with  improper specimen collection/handling, submission of specimen other than nasopharyngeal swab, presence of viral mutation(s) within the areas targeted by this assay, and inadequate number of viral copies(<138 copies/mL). A negative result must be combined with clinical observations, patient history, and epidemiological information. The expected result is Negative.  Fact Sheet for Patients:  EntrepreneurPulse.com.au  Fact Sheet for Healthcare Providers:  IncredibleEmployment.be  This test is no t yet approved or cleared by the [place] FDA and  has been authorized for detection and/or diagnosis of SARS-CoV-2 by FDA under an Emergency Use Authorization (EUA). This EUA will remain  in effect (meaning this test can be used) for the duration of the COVID-19 declaration under Section 564(b)(1) of the Act, 21 U.S.C.section 360bbb-3(b)(1), unless the authorization is terminated  or revoked sooner.       Influenza A by  PCR NEGATIVE NEGATIVE Final   Influenza B by PCR NEGATIVE NEGATIVE Final    Comment: (NOTE) The Xpert Xpress SARS-CoV-2/FLU/RSV plus assay is intended as an aid in the diagnosis of influenza from Nasopharyngeal swab specimens and should not be used as a sole basis for treatment. Nasal washings and aspirates are unacceptable for Xpert Xpress SARS-CoV-2/FLU/RSV testing.  Fact Sheet for Patients: EntrepreneurPulse.com.au  Fact Sheet for Healthcare Providers: IncredibleEmployment.be  This test is not yet approved or cleared by the [place] FDA and has been authorized for detection and/or diagnosis of SARS-CoV-2 by FDA under an Emergency Use Authorization (EUA). This EUA will  remain in effect (meaning this test can be used) for the duration of the COVID-19 declaration under Section 564(b)(1) of the Act, 21 U.S.C. section 360bbb-3(b)(1), unless the authorization is terminated or revoked.  Performed at [facility], [address] [place], [place] [address]      Labs: BNP (last 3 results) No results for input(s): BNP in the last 8760 hours. Basic Metabolic Panel: Recent Labs  Lab 01/08/21 0658 01/09/21 1145 01/10/21 0455 01/11/21 1223 01/12/21 0536 01/13/21 0437 01/14/21 0500  NA 139   < > 133* 139 137 138 137  K 4.0   < > 3.5 4.3 4.2 4.1 3.7  CL 112*   < > 107 111 105 106 105  CO2 22   < > 23 25 26 28 27   GLUCOSE 74   < > 102* 85 161* 168* 105*  BUN 16   < > 10 <5* 5* 7 7  CREATININE 0.53   < > 0.53 0.46 0.51 0.46 0.45  CALCIUM 7.8*   < > 7.8* 8.2* 8.5* 8.4* 8.1*  MG 1.8  --   --   --   --   --   --   PHOS 4.6  --   --   --   --   --   --    < > = values in this interval not displayed.   Liver Function Tests: Recent Labs  Lab 01/07/21 1240 01/08/21 0658  AST 20 17  ALT 15 13  ALKPHOS 41 35*  BILITOT 0.4 0.4  PROT 5.9* 5.0*  ALBUMIN 3.3* 2.7*   Recent Labs  Lab 01/07/21 1240  LIPASE 27   No results for  input(s): AMMONIA in the last 168 hours. CBC: Recent Labs  Lab 01/09/21 1145 01/10/21 0455 01/11/21 1150 01/12/21 0536 01/13/21 0437 01/14/21 0500  WBC 2.0*   2.0* 2.5* 2.3* 3.1* 2.7* 2.5*  NEUTROABS 1.1*  --  1.1*  --   --   --   HGB 9.3*   9.5* 9.4* 9.8* 10.2* 9.4* 9.4*  HCT 29.3*   29.5* 29.2* 30.3* 32.5* 29.3* 29.3*  MCV 80.1   79.7* 80.2 81.9 80.6 81.2 81.2  PLT 209   210 181 188 169 166 151   Cardiac Enzymes: No results for input(s): CKTOTAL, CKMB, CKMBINDEX, TROPONINI in the last 168 hours. BNP: Invalid input(s): POCBNP CBG: Recent Labs  Lab 01/13/21 1631 01/13/21 1935 01/13/21 2319 01/14/21 0804 01/14/21 1137  GLUCAP 84 98 108* 92 78   D-Dimer No results for input(s): DDIMER in the last 72 hours. Hgb A1c No results for input(s): HGBA1C in the last 72 hours. Lipid Profile No results for input(s): CHOL, HDL, LDLCALC, TRIG, CHOLHDL, LDLDIRECT in the last 72 hours. Thyroid function studies No results for input(s): TSH, T4TOTAL, T3FREE, THYROIDAB in the last 72 hours.  Invalid input(s): FREET3 Anemia work up No results for input(s): VITAMINB12, FOLATE, FERRITIN, TIBC, IRON, RETICCTPCT in the last 72 hours. Urinalysis    Component Value Date/Time   COLORURINE YELLOW 01/07/2021 1925   APPEARANCEUR CLEAR 01/07/2021 1925   LABSPEC 1.020 01/07/2021 1925   PHURINE 6.5 01/07/2021 1925   GLUCOSEU NEGATIVE 01/07/2021 1925   HGBUR TRACE (A) 01/07/2021 1925   BILIRUBINUR NEGATIVE 01/07/2021 1925   KETONESUR NEGATIVE 01/07/2021 1925   PROTEINUR NEGATIVE 01/07/2021 1925   NITRITE NEGATIVE 01/07/2021 1925   LEUKOCYTESUR NEGATIVE 01/07/2021 1925   Sepsis Labs Invalid input(s): PROCALCITONIN,  WBC,  LACTICIDVEN Microbiology Recent Results (from the past 240  hour(s))  C difficile quick screen w PCR reflex     Status: Abnormal   Collection Time: 01/04/21  1:58 PM   Specimen: STOOL  Result Value Ref Range Status   C Diff antigen POSITIVE (A) NEGATIVE Final   C Diff  toxin NEGATIVE NEGATIVE Final   C Diff interpretation Results are indeterminate. See PCR results.  Final    Comment: Performed at [facility], [address] [place], [place] [address]  Gastrointestinal Panel by PCR , Stool     Status: None   Collection Time: 01/04/21  1:58 PM   Specimen: Stool  Result Value Ref Range Status   Campylobacter species NOT DETECTED NOT DETECTED Final   Plesimonas shigelloides NOT DETECTED NOT DETECTED Final   Salmonella species NOT DETECTED NOT DETECTED Final   Yersinia enterocolitica NOT DETECTED NOT DETECTED Final   Vibrio species NOT DETECTED NOT DETECTED Final   Vibrio cholerae NOT DETECTED NOT DETECTED Final   Enteroaggregative E coli (EAEC) NOT DETECTED NOT DETECTED Final   Enteropathogenic E coli (EPEC) NOT DETECTED NOT DETECTED Final   Enterotoxigenic E coli (ETEC) NOT DETECTED NOT DETECTED Final   Shiga like toxin producing E coli (STEC) NOT DETECTED NOT DETECTED Final   Shigella/Enteroinvasive E coli (EIEC) NOT DETECTED NOT DETECTED Final   Cryptosporidium NOT DETECTED NOT DETECTED Final   Cyclospora cayetanensis NOT DETECTED NOT DETECTED Final   Entamoeba histolytica NOT DETECTED NOT DETECTED Final   Giardia lamblia NOT DETECTED NOT DETECTED Final   Adenovirus F40/41 NOT DETECTED NOT DETECTED Final   Astrovirus NOT DETECTED NOT DETECTED Final   Norovirus GI/GII NOT DETECTED NOT DETECTED Final   Rotavirus A NOT DETECTED NOT DETECTED Final   Sapovirus (I, II, IV, and V) NOT DETECTED NOT DETECTED Final    Comment: Performed at [facility], [address] [place], [place] [address]  C. Diff by PCR, Reflexed     Status: Abnormal   Collection Time: 01/04/21  1:58 PM  Result Value Ref Range Status   Toxigenic C. Difficile by PCR POSITIVE (A) NEGATIVE Final    Comment: Positive for toxigenic C. difficile with little to no toxin production. Only treat if clinical presentation suggests symptomatic illness. Performed at [facility], [address] [place], [place] [address]   Resp Panel by RT-PCR (Flu A&B, Covid) Nasopharyngeal Swab     Status: None   Collection Time: 01/06/21  2:38 PM   Specimen: Nasopharyngeal Swab; Nasopharyngeal(NP) swabs in vial transport medium  Result Value Ref Range Status   SARS Coronavirus 2 by RT PCR NEGATIVE NEGATIVE Final    Comment: (NOTE) SARS-CoV-2 target nucleic acids are NOT DETECTED.  The SARS-CoV-2 RNA is generally detectable in upper respiratory specimens during the acute phase of infection. The lowest concentration of SARS-CoV-2 viral copies this assay can detect is 138 copies/mL. A negative result does not preclude SARS-Cov-2 infection and should not be used as the sole basis for treatment or other patient management decisions. A negative result may occur with  improper specimen collection/handling, submission of specimen other than nasopharyngeal swab, presence of viral mutation(s) within the areas targeted by this assay, and inadequate number of viral copies(<138 copies/mL). A negative result must be combined with clinical observations, patient history, and epidemiological information. The expected result is Negative.  Fact Sheet for Patients:  EntrepreneurPulse.com.au  Fact Sheet for Healthcare Providers:  IncredibleEmployment.be  This test is no t yet approved or cleared by the [place] and  has been authorized for detection and/or diagnosis of SARS-CoV-2 by FDA under an Emergency Use Authorization (EUA). This EUA will remain  in effect (meaning this test can be used) for the duration of the COVID-19 declaration under Section 564(b)(1) of the Act, 21 U.S.C.section 360bbb-3(b)(1), unless the authorization is terminated  or revoked sooner.       Influenza A by PCR NEGATIVE NEGATIVE Final   Influenza B by PCR NEGATIVE NEGATIVE Final    Comment: (NOTE) The Xpert Xpress SARS-CoV-2/FLU/RSV plus assay is  intended as an aid in the diagnosis of influenza from Nasopharyngeal swab specimens and should not be used as a sole basis for treatment. Nasal washings and aspirates are unacceptable for Xpert Xpress SARS-CoV-2/FLU/RSV testing.  Fact Sheet for Patients: EntrepreneurPulse.com.au  Fact Sheet for Healthcare Providers: IncredibleEmployment.be  This test is not yet approved or cleared by the [place] FDA and has been authorized for detection and/or diagnosis of SARS-CoV-2 by FDA under an Emergency Use Authorization (EUA). This EUA will remain in effect (meaning this test can be used) for the duration of the COVID-19 declaration under Section 564(b)(1) of the Act, 21 U.S.C. section 360bbb-3(b)(1), unless the authorization is terminated or revoked.  Performed at [facility], [address] [place], [place] [address]      Time coordinating discharge: Over 30 minutes  SIGNED:   Wyvonnia Dusky, MD  [facility] 01/14/2021, 11:52 AM Pager   If 7PM-7AM, please contact night-coverage

## 2021-01-15 ENCOUNTER — Ambulatory Visit
Admission: EM | Admit: 2021-01-15 | Discharge: 2021-01-15 | Disposition: A | Discharge status: Home / Self Care | Payer: Medicare Other | Attending: Emergency Medicine | Admitting: Emergency Medicine

## 2021-01-15 ENCOUNTER — Other Ambulatory Visit: Payer: Self-pay

## 2021-01-15 ENCOUNTER — Encounter: Payer: Self-pay | Admitting: Emergency Medicine

## 2021-01-15 DIAGNOSIS — U071 COVID-19: Secondary | ICD-10-CM | POA: Insufficient documentation

## 2021-01-15 LAB — RESP PANEL BY RT-PCR (FLU A&B, COVID) ARPGX2
Influenza A by PCR: NEGATIVE
Influenza B by PCR: NEGATIVE
SARS Coronavirus 2 by RT PCR: POSITIVE — AB

## 2021-01-15 MED ORDER — BENZONATATE 200 MG PO CAPS: 200.0000 mg | ORAL_CAPSULE | Freq: Three times a day (TID) | ORAL | 0 refills | Status: DC | PRN

## 2021-01-15 MED ORDER — MOLNUPIRAVIR EUA 200MG CAPSULE: 4.0000 | ORAL_CAPSULE | Freq: Two times a day (BID) | ORAL | 0 refills | Status: AC

## 2021-01-15 MED ORDER — FLUTICASONE PROPIONATE 50 MCG/ACT NA SUSP: 2.0000 | Freq: Every day | NASAL | 0 refills | Status: DC

## 2021-01-15 NOTE — ED Triage Notes (Signed)
Pt c/o runny nose and cough sxs x 1 day. Pt is here with friend that has Covid. Pt was in hospital for Cdiff but is now on Vanc and feeling better.

## 2021-01-15 NOTE — ED Provider Notes (Signed)
HPI  SUBJECTIVE:  Vanessa Romero is a 45 y.o. female who presents with nasal congestion, dry cough, clear rhinorrhea, maxillary sinus pain and pressure, postnasal drip starting today.  No fevers, new or different headaches, body aches, nausea, vomiting, diarrhea, abdominal pain.  No loss of sense of smell or taste, wheezing, shortness of breath.  She got 4 doses of the COVID-vaccine.  She did not yet get this years flu vaccine.  No known flu exposure.  She took Tylenol within 6 hours of evaluation without improvement in her symptoms.  No aggravating factors.  She has a past medical history of cervical cancer and is currently on chemotherapy and radiation, she currently has C. difficile diarrhea and is on vancomycin.  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  No history of pulmonary disease, chronic kidney disease, diabetes, hypertension. Her husband tested positive for COVID this morning.  She states that her oncologist sent here to get tested for COVID.  PMD: [facility].      Past Medical History:  Diagnosis Date   Alcohol abuse    Anemia    Cancer ([place])    Tobacco dependence     Past Surgical History:  Procedure Laterality Date   ABDOMINAL ADHESION SURGERY     bowel obstruction     x2   CERVICAL CONIZATION W/BX N/A 11/26/2020   Procedure: CONIZATION CERVIX WITH BIOPSY;  Surgeon: Malachy Mood, MD;  Location: [facility] ORS;  Service: Gynecology;  Laterality: N/A;   ESOPHAGOGASTRODUODENOSCOPY N/A 11/24/2020   Procedure: ESOPHAGOGASTRODUODENOSCOPY (EGD);  Surgeon: Lin Landsman, MD;  Location: [facility] ENDOSCOPY;  Service: Gastroenterology;  Laterality: N/A;   laparoscopic knee surgery     PORTA CATH INSERTION N/A 12/20/2020   Procedure: PORTA CATH INSERTION;  Surgeon: Algernon Huxley, MD;  Location: [facility] CV LAB;  Service: Cardiovascular;  Laterality: N/A;   ROUX-EN-Y GASTRIC BYPASS     TEAR DUCT PROBING      unclogg   VAGOTOMY     VENTRICULOPERITONEAL SHUNT     x6 put in and removals    Family History  Problem Relation Age of Onset   Cancer Mother    Cancer Father    Cancer Maternal Grandmother     Social History   Tobacco Use   Smoking status: Former    Types: Cigarettes   Smokeless tobacco: Former  Scientific laboratory technician Use: Former  Substance Use Topics   Alcohol use: Not Currently   Drug use: Not Currently    No current facility-administered medications for this encounter.  Current Outpatient Medications:    benzonatate (TESSALON) 200 MG capsule, Take 1 capsule (200 mg total) by mouth 3 (three) times daily as needed for cough., Disp: 30 capsule, Rfl: 0   cholecalciferol (VITAMIN D) 25 MCG tablet, Take 1 tablet (1,000 Units total) by mouth daily., Disp: 30 tablet, Rfl: 1   cyanocobalamin 1000 MCG tablet, Take 1 tablet (1,000 mcg total) by mouth daily., Disp: 30 tablet, Rfl: 0   escitalopram (LEXAPRO) 20 MG tablet, Take 1 tablet (20 mg total) by mouth daily at 12 noon., Disp: 30 tablet, Rfl: 1   fentaNYL (DURAGESIC) 25 MCG/HR, Place 1 patch onto the skin every 3 (three) days for 5 days., Disp: 1 patch, Rfl: 0   fluticasone (FLONASE) 50 MCG/ACT nasal spray, Place 2 sprays into both nostrils daily., Disp: 16 g, Rfl: 0   HYDROcodone-acetaminophen (NORCO) 10-325 MG tablet, Take 1  tablet by mouth every 6 (six) hours as needed for up to 5 days for moderate pain or severe pain., Disp: 20 tablet, Rfl: 0   lamoTRIgine (LAMICTAL) 25 MG tablet, Take 2 tablets (50 mg total) by mouth daily., Disp: 60 tablet, Rfl: 1   molnupiravir EUA (LAGEVRIO) 200 mg CAPS capsule, Take 4 capsules (800 mg total) by mouth 2 (two) times daily for 5 days., Disp: 40 capsule, Rfl: 0   ondansetron (ZOFRAN) 8 MG tablet, Take 1 tablet (8 mg total) by mouth 2 (two) times daily as needed for refractory nausea / vomiting., Disp: 60 tablet, Rfl: 1   sucralfate (CARAFATE) 1 g tablet, Take 1 tablet (1 g total) by mouth 3  (three) times daily., Disp: 90 tablet, Rfl: 1   vancomycin (VANCOCIN) 250 MG capsule, Take 2 capsules (500 mg total) by mouth 4 (four) times daily for 5 days., Disp: 40 capsule, Rfl: 0   ALPRAZolam (XANAX) 0.5 MG tablet, Take 1 tablet (0.5 mg total) by mouth 2 (two) times daily as needed for up to 5 days for anxiety., Disp: 10 tablet, Rfl: 0   dicyclomine (BENTYL) 10 MG capsule, Take 1 capsule (10 mg total) by mouth 3 (three) times daily as needed (abd pain)., Disp: 20 capsule, Rfl: 0   feeding supplement (ENSURE ENLIVE / ENSURE PLUS) LIQD, Take 237 mLs by mouth 3 (three) times daily between meals., Disp: 16109 mL, Rfl: 0   folic acid (FOLVITE) 1 MG tablet, Take 1 tablet (1 mg total) by mouth daily., Disp: 30 tablet, Rfl: 0   lidocaine-prilocaine (EMLA) cream, Apply to affected area once, Disp: 30 g, Rfl: 3   Multiple Vitamin (MULTIVITAMIN WITH MINERALS) TABS tablet, Take 1 tablet by mouth daily., Disp: , Rfl:    prochlorperazine (COMPAZINE) 10 MG tablet, Take 1 tablet (10 mg total) by mouth every 6 (six) hours as needed (Nausea or vomiting)., Disp: 60 tablet, Rfl: 1   thiamine 100 MG tablet, Take 1 tablet (100 mg total) by mouth daily., Disp: 30 tablet, Rfl: 0  Facility-Administered Medications Ordered in Other Encounters:    heparin lock flush 100 UNIT/ML injection, , , ,   Allergies  Allergen Reactions   Contrast Media [Iodinated Contrast Media] Hives   Gabapentin Other (See Comments), Rash and Palpitations    Other Reaction: tachycardia Other Reaction: tachycardia    Morphine Dermatitis, Hives, Rash, Swelling and Other (See Comments)    Other reaction(s): Unknown (comments) Has tolerated hydromorphone (Dilaudid) Immediate after injections arm edema and arm turned bright red Immediate after injections arm edema and arm turned bright red IV Morphine IV Morphine    Sumatriptan Dermatitis, Hives, Itching, Other (See Comments) and Swelling    Other reaction(s): Joint Pain, Other (See  Comments), Other (see comments), Unknown (comments) lock jaw Lock jaw Lock jaw Lock jaw TIGHTENING OF JAW Lock jaw lock jaw Lock jaw TIGHTENING OF JAW Lock jaw    Zolpidem Nausea And Vomiting and Other (See Comments)    Other reaction(s): Other (see comments) sleep walking sleep walking Sleep walking  don't tolerate it well    Erythromycin Diarrhea, Nausea And Vomiting and Nausea Only    Extreme upset stomach    Valproic Acid Rash    Other reaction(s): Other (see comments), Unknown MOOD DISORDER MOOD DISORDER Depakote: Reaction unknown     Acetazolamide     Other reaction(s): Unknown (comments)   Erythromycin Base     Other reaction(s): UNKNOWN   Amoxicillin Rash   Divalproex Sodium Anxiety  and Other (See Comments)     ROS  As noted in HPI.   Physical Exam  BP 121/65 (BP Location: Left Arm)    Pulse 79    Temp 97.8 F (36.6 C) (Oral)    Resp 16    Ht 5\' 7"  (1.702 m)    Wt 54.4 kg    LMP 01/04/2021    SpO2 100%    BMI 18.79 kg/m   Constitutional: Well developed, well nourished, no acute distress.coughing Eyes: PERRL, EOMI, conjunctiva normal bilaterally HENT: Normocephalic, atraumatic,mucus membranes moist.  Positive nasal congestion.  Normal turbinates.  Positive maxillary, frontal sinus tenderness.  No postnasal drip. Neck: No cervical lymphadenopathy Respiratory: Clear to auscultation bilaterally, no rales, no wheezing, no rhonchi Cardiovascular: Normal rate and rhythm, no murmurs, no gallops, no rubs GI: Back: no CVAT skin: No rash, skin intact Musculoskeletal:  no deformities Neurologic: Alert & oriented x 3, CN III-XII grossly intact, no motor deficits, sensation grossly intact Psychiatric: Speech and behavior appropriate   ED Course   Medications - No data to display  Orders Placed This Encounter  Procedures   Resp Panel by RT-PCR (Flu A&B, Covid) Nasopharyngeal Swab    Standing Status:   Standing    Number of Occurrences:   1   Airborne  and Contact precautions    Standing Status:   Standing    Number of Occurrences:   1   Results for orders placed or performed during the hospital encounter of 01/15/21 (from the past 24 hour(s))  Resp Panel by RT-PCR (Flu A&B, Covid) Nasopharyngeal Swab     Status: Abnormal   Collection Time: 01/15/21  2:07 PM   Specimen: Nasopharyngeal Swab; Nasopharyngeal(NP) swabs in vial transport medium  Result Value Ref Range   SARS Coronavirus 2 by RT PCR POSITIVE (A) NEGATIVE   Influenza A by PCR NEGATIVE NEGATIVE   Influenza B by PCR NEGATIVE NEGATIVE   No results found.  ED Clinical Impression  1. COVID-19 virus infection      ED Assessment/Plan  COVID-positive.  Home with Molnupiravir, Tessalon, Flonase, saline nasal irrigation.  ER return precautions given.  Discussed labs, MDM, treatment plan, and plan for follow-up with patient Discussed sn/sx that should prompt return to the ED. patient agrees with plan.   Meds ordered this encounter  Medications   molnupiravir EUA (LAGEVRIO) 200 mg CAPS capsule    Sig: Take 4 capsules (800 mg total) by mouth 2 (two) times daily for 5 days.    Dispense:  40 capsule    Refill:  0   fluticasone (FLONASE) 50 MCG/ACT nasal spray    Sig: Place 2 sprays into both nostrils daily.    Dispense:  16 g    Refill:  0   benzonatate (TESSALON) 200 MG capsule    Sig: Take 1 capsule (200 mg total) by mouth 3 (three) times daily as needed for cough.    Dispense:  30 capsule    Refill:  0      *This clinic note was created using [employer]. Therefore, there may be occasional mistakes despite careful proofreading. ?    Melynda Ripple, MD 01/16/21 1554

## 2021-01-15 NOTE — Discharge Instructions (Addendum)
Flonase, saline nasal irrigation with a [employer] sinus rinse and distilled water as often as you want, Tessalon for the cough.  Finish the [employer], even if you feel better.  Get out and walk every day to prevent blood clots in your lung

## 2021-01-18 ENCOUNTER — Inpatient Hospital Stay: Payer: Medicare Other

## 2021-01-18 ENCOUNTER — Inpatient Hospital Stay: Payer: Medicare Other | Admitting: Oncology

## 2021-01-18 ENCOUNTER — Other Ambulatory Visit: Payer: Self-pay

## 2021-01-18 ENCOUNTER — Ambulatory Visit
Admission: RE | Admit: 2021-01-18 | Discharge: 2021-01-18 | Disposition: A | Discharge status: Home / Self Care | Payer: Medicare Other | Source: Ambulatory Visit | Attending: Radiation Oncology | Admitting: Radiation Oncology

## 2021-01-18 ENCOUNTER — Inpatient Hospital Stay: Payer: Medicare Other | Attending: Hospice and Palliative Medicine | Admitting: Hospice and Palliative Medicine

## 2021-01-18 ENCOUNTER — Telehealth: Payer: Self-pay | Admitting: *Deleted

## 2021-01-18 ENCOUNTER — Telehealth: Payer: Self-pay | Admitting: Hospice and Palliative Medicine

## 2021-01-18 ENCOUNTER — Other Ambulatory Visit: Payer: Self-pay | Admitting: *Deleted

## 2021-01-18 DIAGNOSIS — R1031 Right lower quadrant pain: Secondary | ICD-10-CM | POA: Insufficient documentation

## 2021-01-18 DIAGNOSIS — Z9884 Bariatric surgery status: Secondary | ICD-10-CM | POA: Insufficient documentation

## 2021-01-18 DIAGNOSIS — F419 Anxiety disorder, unspecified: Secondary | ICD-10-CM | POA: Diagnosis not present

## 2021-01-18 DIAGNOSIS — Z79899 Other long term (current) drug therapy: Secondary | ICD-10-CM | POA: Insufficient documentation

## 2021-01-18 DIAGNOSIS — C539 Malignant neoplasm of cervix uteri, unspecified: Secondary | ICD-10-CM | POA: Insufficient documentation

## 2021-01-18 DIAGNOSIS — Z5111 Encounter for antineoplastic chemotherapy: Secondary | ICD-10-CM | POA: Insufficient documentation

## 2021-01-18 DIAGNOSIS — Z8616 Personal history of COVID-19: Secondary | ICD-10-CM | POA: Insufficient documentation

## 2021-01-18 DIAGNOSIS — F32A Depression, unspecified: Secondary | ICD-10-CM | POA: Insufficient documentation

## 2021-01-18 DIAGNOSIS — Z510 Encounter for antineoplastic radiation therapy: Secondary | ICD-10-CM | POA: Insufficient documentation

## 2021-01-18 DIAGNOSIS — G40909 Epilepsy, unspecified, not intractable, without status epilepticus: Secondary | ICD-10-CM | POA: Insufficient documentation

## 2021-01-18 DIAGNOSIS — R112 Nausea with vomiting, unspecified: Secondary | ICD-10-CM | POA: Insufficient documentation

## 2021-01-18 DIAGNOSIS — Z515 Encounter for palliative care: Secondary | ICD-10-CM

## 2021-01-18 DIAGNOSIS — G8929 Other chronic pain: Secondary | ICD-10-CM | POA: Insufficient documentation

## 2021-01-18 DIAGNOSIS — G893 Neoplasm related pain (acute) (chronic): Secondary | ICD-10-CM

## 2021-01-18 MED ORDER — HYDROCODONE-ACETAMINOPHEN 10-325 MG PO TABS: 1.0000 | ORAL_TABLET | Freq: Four times a day (QID) | ORAL | 0 refills | Status: DC | PRN

## 2021-01-18 MED ORDER — NALOXONE HCL 4 MG/0.1ML NA LIQD: NASAL | 0 refills | Status: DC

## 2021-01-18 MED ORDER — FENTANYL 25 MCG/HR TD PT72: 1.0000 | MEDICATED_PATCH | TRANSDERMAL | 0 refills | Status: DC

## 2021-01-18 MED ORDER — ALPRAZOLAM 0.5 MG PO TABS: 0.5000 mg | ORAL_TABLET | Freq: Two times a day (BID) | ORAL | 0 refills | Status: DC | PRN

## 2021-01-18 NOTE — Telephone Encounter (Signed)
Pt called to reschedule appt for today. Call back at [phone]

## 2021-01-18 NOTE — Telephone Encounter (Signed)
This was integrated with previous note

## 2021-01-18 NOTE — Progress Notes (Signed)
Virtual Visit via Video Note  I connected with [name] on 01/18/21 at  2:30 PM EST by a video enabled telemedicine application and verified that I am speaking with the correct person using two identifiers.  Location: Patient: Home Provider: Clinic   I discussed the limitations of evaluation and management by telemedicine and the availability of in person appointments. The patient expressed understanding and agreed to proceed.  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  Patient was hospitalized 01/07/2021-01/14/2021 with intractable abdominal pain, nausea, vomiting, diarrhea.  Patient tested positive for C. difficile antigen but was negative for toxin.  She was started empirically on treatment for C. difficile but GI thought it more probable that symptoms were secondary to cancer treatment effects.  Patient was started on fentanyl patch and Norco, which seem to be controlling her pain well.  She was discharged home and represented to the ER on 01/15/2021 with COVID.   Observations/Objective: Patient reports that she is doing fair since discharging home.  She continues to have diarrhea but quantity and volume have decreased since initial infection.  She denies fever or chills.  She continues to have lower pelvic/hip/back pain but says that this is reasonably well controlled on combination of transdermal fentanyl/Norco.  She also endorses worsening anxiety and requests refill of her alprazolam, which was started when she was in the hospital and is worked well for her.  Patient denies other symptomatic complaints or concerns at present.  Assessment and Plan: Diarrhea -likely multifactorial from chemotherapy/XRT +/- C. Difficile +/- COVID.  GI recommended antidiarrheals which patient has available to take if needed.  Continue  aggressive oral fluids.  Follow-up for labs and possible fluids next week or sooner if needed.  Complete course of oral vancomycin.  Pain -this is also likely multifactorial from cervical cancer and colitis.  Pain seems to be reasonably well controlled on fentanyl/hydrocodone.  We will continue that regimen for now with plan to wean as tolerated.  We could consider referral to interventional pain management for consideration of hypogastric plexus block if pain persists.  Anxiety -continue Lexapro.  We will refill alprazolam.  PDMP reviewed.  Case and plan discussed with Dr. Grayland Ormond   Follow Up Instructions: RTC next week or sooner if needed   I discussed the assessment and treatment plan with the patient. The patient was provided an opportunity to ask questions and all were answered. The patient agreed with the plan and demonstrated an understanding of the instructions.   The patient was advised to call back or seek an in-person evaluation if the symptoms worsen or if the condition fails to improve as anticipated.  I provided 15 minutes of non-face-to-face time during this encounter.   Irean Hong, NP

## 2021-01-18 NOTE — Telephone Encounter (Signed)
Pt contacted and agreed to virtual [facility] visit to discuss management of pain medications. Appointment scheduled.

## 2021-01-18 NOTE — Telephone Encounter (Signed)
Patient called reporting that she tested positive for COVID Friday and she is asking when she needs to come in for appointment she is going to miss today. She is also asking if she is to continue the Fentanyl 25 mcg patches she was started on in the hospital and the Alprazolam 0.5 mg twice a day. She is requesting a refill of her Tramadol and Hydrocodone 5/325 mg, but discharge instructions say to stop those medications, but then increased her Hydrocodone to 10/325 mg dose. Please advise  Medication List       STOP taking these medications     HYDROcodone-acetaminophen 5-325 MG tablet Commonly known as: NORCO/VICODIN Replaced by: HYDROcodone-acetaminophen 10-325 MG tablet    traMADol 50 MG tablet Commonly known as: ULTRAM           TAKE these medications     ALPRAZolam 0.5 MG tablet Commonly known as: XANAX Take 1 tablet (0.5 mg total) by mouth 2 (two) times daily as needed for up to 5 days for anxiety.    cyanocobalamin 1000 MCG tablet Take 1 tablet (1,000 mcg total) by mouth daily.    dicyclomine 10 MG capsule Commonly known as: BENTYL Take 1 capsule (10 mg total) by mouth 3 (three) times daily as needed (abd pain).    escitalopram 20 MG tablet Commonly known as: LEXAPRO Take 1 tablet (20 mg total) by mouth daily at 12 noon.    feeding supplement Liqd Take 237 mLs by mouth 3 (three) times daily between meals.    fentaNYL 25 MCG/HR Commonly known as: [place] 1 patch onto the skin every 3 (three) days for 5 days. Start taking on: January 16, 5680    folic acid 1 MG tablet Commonly known as: FOLVITE Take 1 tablet (1 mg total) by mouth daily.    HYDROcodone-acetaminophen 10-325 MG tablet Commonly known as: Norco Take 1 tablet by mouth every 6 (six) hours as needed for up to 5 days for moderate pain or severe pain. Replaces: HYDROcodone-acetaminophen 5-325 MG tablet    lamoTRIgine 25 MG tablet Commonly known as: LAMICTAL Take 2 tablets (50 mg total) by mouth  daily.    lidocaine-prilocaine cream Commonly known as: EMLA Apply to affected area once    multivitamin with minerals Tabs tablet Take 1 tablet by mouth daily.    ondansetron 8 MG tablet Commonly known as: Zofran Take 1 tablet (8 mg total) by mouth 2 (two) times daily as needed for refractory nausea / vomiting.    prochlorperazine 10 MG tablet Commonly known as: COMPAZINE Take 1 tablet (10 mg total) by mouth every 6 (six) hours as needed (Nausea or vomiting).    sucralfate 1 g tablet Commonly known as: Carafate Take 1 tablet (1 g total) by mouth 3 (three) times daily.    thiamine 100 MG tablet Take 1 tablet (100 mg total) by mouth daily.    vancomycin 250 MG capsule Commonly known as: VANCOCIN Take 2 capsules (500 mg total) by mouth 4 (four) times daily for 5 days. What changed:  medication strength how much to take    Vitamin D3 25 MCG tablet Commonly known as: Vitamin D Take 1 tablet (1,000 Units total) by mouth daily.

## 2021-01-19 ENCOUNTER — Encounter: Payer: Self-pay | Admitting: Emergency Medicine

## 2021-01-19 ENCOUNTER — Other Ambulatory Visit: Payer: Self-pay

## 2021-01-19 ENCOUNTER — Ambulatory Visit: Payer: Medicare Other

## 2021-01-19 ENCOUNTER — Emergency Department
Admission: EM | Admit: 2021-01-19 | Discharge: 2021-01-19 | Disposition: A | Discharge status: Home / Self Care | Payer: Medicare Other | Source: Home / Self Care | Attending: Emergency Medicine | Admitting: Emergency Medicine

## 2021-01-19 ENCOUNTER — Emergency Department: Payer: Medicare Other

## 2021-01-19 DIAGNOSIS — I10 Essential (primary) hypertension: Secondary | ICD-10-CM | POA: Insufficient documentation

## 2021-01-19 DIAGNOSIS — R1031 Right lower quadrant pain: Secondary | ICD-10-CM

## 2021-01-19 DIAGNOSIS — K56609 Unspecified intestinal obstruction, unspecified as to partial versus complete obstruction: Secondary | ICD-10-CM | POA: Diagnosis not present

## 2021-01-19 DIAGNOSIS — Z8541 Personal history of malignant neoplasm of cervix uteri: Secondary | ICD-10-CM | POA: Insufficient documentation

## 2021-01-19 DIAGNOSIS — K529 Noninfective gastroenteritis and colitis, unspecified: Secondary | ICD-10-CM | POA: Insufficient documentation

## 2021-01-19 DIAGNOSIS — R197 Diarrhea, unspecified: Secondary | ICD-10-CM

## 2021-01-19 DIAGNOSIS — A0471 Enterocolitis due to Clostridium difficile, recurrent: Secondary | ICD-10-CM | POA: Diagnosis not present

## 2021-01-19 DIAGNOSIS — R112 Nausea with vomiting, unspecified: Secondary | ICD-10-CM

## 2021-01-19 LAB — CBC WITH DIFFERENTIAL/PLATELET
Abs Immature Granulocytes: 0.01 10*3/uL (ref 0.00–0.07)
Basophils Absolute: 0 10*3/uL (ref 0.0–0.1)
Basophils Relative: 1 %
Eosinophils Absolute: 0.1 10*3/uL (ref 0.0–0.5)
Eosinophils Relative: 4 %
HCT: 33.9 % — ABNORMAL LOW (ref 36.0–46.0)
Hemoglobin: 10.9 g/dL — ABNORMAL LOW (ref 12.0–15.0)
Immature Granulocytes: 0 %
Lymphocytes Relative: 34 %
Lymphs Abs: 1 10*3/uL (ref 0.7–4.0)
MCH: 25.9 pg — ABNORMAL LOW (ref 26.0–34.0)
MCHC: 32.2 g/dL (ref 30.0–36.0)
MCV: 80.5 fL (ref 80.0–100.0)
Monocytes Absolute: 0.3 10*3/uL (ref 0.1–1.0)
Monocytes Relative: 9 %
Neutro Abs: 1.6 10*3/uL — ABNORMAL LOW (ref 1.7–7.7)
Neutrophils Relative %: 52 %
Platelets: 234 10*3/uL (ref 150–400)
RBC: 4.21 MIL/uL (ref 3.87–5.11)
Smear Review: NORMAL
WBC: 3.1 10*3/uL — ABNORMAL LOW (ref 4.0–10.5)
nRBC: 0 % (ref 0.0–0.2)

## 2021-01-19 LAB — COMPREHENSIVE METABOLIC PANEL
ALT: 21 U/L (ref 0–44)
AST: 27 U/L (ref 15–41)
Albumin: 3.3 g/dL — ABNORMAL LOW (ref 3.5–5.0)
Alkaline Phosphatase: 53 U/L (ref 38–126)
Anion gap: 8 (ref 5–15)
BUN: 14 mg/dL (ref 6–20)
CO2: 22 mmol/L (ref 22–32)
Calcium: 8.4 mg/dL — ABNORMAL LOW (ref 8.9–10.3)
Chloride: 103 mmol/L (ref 98–111)
Creatinine, Ser: 0.85 mg/dL (ref 0.44–1.00)
GFR, Estimated: >60 mL/min (ref >60)
Glucose, Bld: 144 mg/dL — ABNORMAL HIGH (ref 70–99)
Potassium: 3.5 mmol/L (ref 3.5–5.1)
Sodium: 133 mmol/L — ABNORMAL LOW (ref 135–145)
Total Bilirubin: 0.4 mg/dL (ref 0.3–1.2)
Total Protein: 6.2 g/dL — ABNORMAL LOW (ref 6.5–8.1)

## 2021-01-19 LAB — URINALYSIS, ROUTINE W REFLEX MICROSCOPIC
Glucose, UA: NEGATIVE mg/dL
Ketones, ur: 5 mg/dL — AB
Nitrite: NEGATIVE
Protein, ur: 100 mg/dL — AB
RBC / HPF: >50 RBC/hpf — ABNORMAL HIGH (ref 0–5)
Specific Gravity, Urine: 1.041 — ABNORMAL HIGH (ref 1.005–1.030)
WBC, UA: >50 WBC/hpf — ABNORMAL HIGH (ref 0–5)
pH: 5 (ref 5.0–8.0)

## 2021-01-19 LAB — LIPASE, BLOOD: Lipase: 37 U/L (ref 11–51)

## 2021-01-19 LAB — POC URINE PREG, ED: Preg Test, Ur: NEGATIVE

## 2021-01-19 LAB — TROPONIN I (HIGH SENSITIVITY): Troponin I (High Sensitivity): <2 ng/L (ref <18)

## 2021-01-19 LAB — LACTIC ACID, PLASMA
Lactic Acid, Venous: 1.2 mmol/L (ref 0.5–1.9)
Lactic Acid, Venous: 1.1 mmol/L (ref 0.5–1.9)

## 2021-01-19 MED ORDER — HEPARIN SOD (PORK) LOCK FLUSH 100 UNIT/ML IV SOLN
500.0000 [IU] | Freq: Once | INTRAVENOUS | Status: AC
  Administered 2021-01-19: 500 [IU] via INTRAVENOUS
  Filled 2021-01-19: qty 5

## 2021-01-19 MED ORDER — OXYCODONE-ACETAMINOPHEN 5-325 MG PO TABS: 1.0000 | ORAL_TABLET | ORAL | 0 refills | Status: DC | PRN

## 2021-01-19 MED ORDER — LACTATED RINGERS IV BOLUS
1000.0000 mL | Freq: Once | INTRAVENOUS | Status: AC
  Administered 2021-01-19: 1000 mL via INTRAVENOUS

## 2021-01-19 MED ORDER — MORPHINE SULFATE (PF) 4 MG/ML IV SOLN
4.0000 mg | Freq: Once | INTRAVENOUS | Status: AC
  Administered 2021-01-19: 4 mg via INTRAVENOUS
  Filled 2021-01-19: qty 1

## 2021-01-19 MED ORDER — PROCHLORPERAZINE EDISYLATE 10 MG/2ML IJ SOLN
10.0000 mg | Freq: Once | INTRAMUSCULAR | Status: AC
Start: 2021-01-19 — End: 2021-01-19
  Administered 2021-01-19: 10 mg via INTRAVENOUS
  Filled 2021-01-19: qty 2

## 2021-01-19 NOTE — ED Triage Notes (Signed)
Patient ambulatory to triage with steady gait, without difficulty or distress noted; pt st recently admitted for cdiff and currently taking vancomycin; +COVID on Friday; c/o returning diarrhea with N/V and abd pain; st her fentanyl patches and hydrocodone not helping

## 2021-01-19 NOTE — ED Provider Notes (Signed)
[facility] Provider Note    Event Date/Time   First MD Initiated Contact with Patient 01/19/21 [phone]     (approximate)   History   Chief Complaint Abdominal Pain   HPI  Vanessa Romero is a 46 y.o. female with past medical history of cervical cancer, seizures, alcohol abuse, gastric bypass, intracranial hypertension, bowel obstruction, and C. difficile who presents to the ED complaining of abdominal pain.  Patient reports that over the past 2 days she has had increasing "searing" pain extending along the entire right side of her abdomen.  She describes pain as constant and not exacerbated or alleviated by anything in particular.  It has been associated with diarrhea along with small amounts of blood in her stool.  She has been feeling nauseous but has not vomited, denies any fevers.  She does states she has noticed some blood in her urine but denies any dysuria or flank pain.  She was recently admitted to the hospital for management of abdominal pain and diarrhea after testing positive for C. difficile on December 20.  She states that she was feeling better at the time of discharge on December 30 until similar symptoms returned 2 days ago.  She also reports testing positive for COVID-19 on December 31, but denies cough or shortness of breath.  She does state that she experienced some pressure in her chest yesterday that has since resolved.     Physical Exam   Triage Vital Signs: ED Triage Vitals  Enc Vitals Group     BP 01/19/21 0632 112/65     Pulse Rate 01/19/21 0632 77     Resp 01/19/21 0632 20     Temp 01/19/21 0632 97.9 F (36.6 C)     Temp Source 01/19/21 0632 Oral     SpO2 01/19/21 0632 99 %     Weight 01/19/21 0633 120 lb (54.4 kg)     Height 01/19/21 0633 5\' 7"  (1.702 m)     Head Circumference --      Peak Flow --      Pain Score 01/19/21 0632 8     Pain Loc --      Pain Edu? --      Excl. in [place]? --     Most recent vital signs: Vitals:    01/19/21 0632  BP: 112/65  Pulse: 77  Resp: 20  Temp: 97.9 F (36.6 C)  SpO2: 99%    Constitutional: Alert and oriented. Eyes: Conjunctivae are normal. Head: Atraumatic. Nose: No congestion/rhinnorhea. Mouth/Throat: Mucous membranes are moist.  Cardiovascular: Normal rate, regular rhythm. Grossly normal heart sounds.  2+ radial pulses bilaterally. Respiratory: Normal respiratory effort.  No retractions. Lungs CTAB. Gastrointestinal: Soft and tender to palpation in the right upper and lower quadrants with no rebound or guarding. No distention. Genitourinary: Deferred. Musculoskeletal: No lower extremity tenderness nor edema.  Neurologic:  Normal speech and language. No gross focal neurologic deficits are appreciated.    ED Results / Procedures / Treatments   Labs (all labs ordered are listed, but only abnormal results are displayed) Labs Reviewed  CBC WITH DIFFERENTIAL/PLATELET - Abnormal; Notable for the following components:      Result Value   WBC 3.1 (*)    Hemoglobin 10.9 (*)    HCT 33.9 (*)    MCH 25.9 (*)    Neutro Abs 1.6 (*)    All other components within normal limits  COMPREHENSIVE METABOLIC PANEL - Abnormal; Notable for the following components:  Sodium 133 (*)    Glucose, Bld 144 (*)    Calcium 8.4 (*)    Total Protein 6.2 (*)    Albumin 3.3 (*)    All other components within normal limits  URINALYSIS, ROUTINE W REFLEX MICROSCOPIC - Abnormal; Notable for the following components:   Color, Urine AMBER (*)    APPearance CLOUDY (*)    Specific Gravity, Urine 1.041 (*)    Hgb urine dipstick LARGE (*)    Bilirubin Urine SMALL (*)    Ketones, ur 5 (*)    Protein, ur 100 (*)    Leukocytes,Ua TRACE (*)    RBC / HPF >50 (*)    WBC, UA >50 (*)    Bacteria, UA RARE (*)    All other components within normal limits  URINE CULTURE  LIPASE, BLOOD  LACTIC ACID, PLASMA  LACTIC ACID, PLASMA  POC URINE PREG, ED  TROPONIN I (HIGH SENSITIVITY)    RADIOLOGY CT  scan reviewed by me with ongoing distal enteritis and colitis, improved per radiology.  PROCEDURES:  Critical Care performed: No  Procedures   MEDICATIONS ORDERED IN ED: Medications  morphine 4 MG/ML injection 4 mg (4 mg Intravenous Given 01/19/21 1054)  prochlorperazine (COMPAZINE) injection 10 mg (10 mg Intravenous Given 01/19/21 1054)  lactated ringers bolus 1,000 mL (1,000 mLs Intravenous New Bag/Given 01/19/21 1053)     IMPRESSION / MDM / ASSESSMENT AND PLAN / ED COURSE  I reviewed the triage vital signs and the nursing notes.                              46 y.o. female with past medical history of cervical cancer, seizures, alcohol abuse, intracranial hypertension, Roux-en-Y gastric bypass, and C. difficile who presents to the ED complaining of increasing right-sided abdominal pain and diarrhea over the past 2 days.  Chart was reviewed from recent admission and patient had C. difficile antigen that came back positive however was negative for C. difficile toxin.  This may have been a contributing factor to her pain and diarrhea, however patient currently receiving chemotherapy and radiation for cervical cancer, which was thought to be a contributing factor by GI team.  Differential diagnosis includes, but is not limited to, enteritis, colitis, intra-abdominal abscess, worsening C. difficile infection, UTI, and partial bowel obstruction.  Patient is well-appearing on my evaluation, does have tenderness on the right side of her abdomen.  We will further assess with CT scan to see if prior enteritis is improving and ensure no associated complication or other finding to explain her pain.  Labs thus far are unremarkable, CBC and CMP show no anemia or electrolyte abnormality.  We will treat symptomatically with IV morphine and Compazine, hydrate with IV fluids.  UA appears to be a contaminated sample and given lack of significant urinary symptoms, low suspicion for UTI.  We will send urine for  culture but hold off on antibiotic treatment for this.  CT scan is reassuring with improving enteritis and colitis per radiology.  Patient also noted to have mild biliary dilatation, however doubt this is clinically significant given her normal LFTs.  Patient reports symptoms improved on reassessment and she is appropriate for outpatient management with GI and oncology follow-up.  She reports having nausea medication available at home, we will prescribe short course of oxycodone for pain control.  She was counseled to continue antibiotics for C. difficile and to return to the ED  for new worsening symptoms, patient agrees with plan.       FINAL CLINICAL IMPRESSION(S) / ED DIAGNOSES   Final diagnoses:  Right lower quadrant abdominal pain  Diarrhea, unspecified type  Nausea and vomiting, unspecified vomiting type  Enteritis     Rx / DC Orders   ED Discharge Orders          Ordered    oxyCODONE-acetaminophen (PERCOCET) 5-325 MG tablet  Every 4 hours PRN        01/19/21 1115             Note:  This document was prepared using Dragon voice recognition software and may include unintentional dictation errors.   Blake Divine, MD 01/19/21 [phone]

## 2021-01-20 ENCOUNTER — Ambulatory Visit: Payer: Medicare Other

## 2021-01-20 ENCOUNTER — Other Ambulatory Visit: Payer: Self-pay | Admitting: Hospice and Palliative Medicine

## 2021-01-20 ENCOUNTER — Telehealth: Payer: Self-pay | Admitting: *Deleted

## 2021-01-20 DIAGNOSIS — C539 Malignant neoplasm of cervix uteri, unspecified: Secondary | ICD-10-CM

## 2021-01-20 LAB — URINE CULTURE

## 2021-01-20 NOTE — Telephone Encounter (Signed)
Patient called concerned about the CT results from yesterday and is asking if this means her cancer is spreading. Please return her call  Narrative & Impression  CLINICAL DATA:  Abdominal pain. Diarrhea, nausea, and vomiting. Current treatment for Clostridium difficile colitis.   EXAM: CT ABDOMEN AND PELVIS WITHOUT CONTRAST   TECHNIQUE: Multidetector CT imaging of the abdomen and pelvis was performed following the standard protocol without IV contrast.   COMPARISON:  CT abdomen and pelvis 01/09/2021   FINDINGS: Lower chest: Clear lung bases.   Hepatobiliary: No focal liver abnormality is seen. Status post cholecystectomy with mildly increased extrahepatic biliary dilatation compared to the prior study with the common bile duct measuring approximately 1.1 cm in diameter proximally and tapering distally.   Pancreas: Unremarkable.   Spleen: Small calcification in the central aspect of the spleen.   Adrenals/Urinary Tract: Unremarkable adrenal glands. No evidence of renal mass, calculi, or hydronephrosis. Nondistended bladder.   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 Bowel assessment is limited by the absence of IV and oral contrast material. There is no evidence of bowel obstruction. Distal small bowel and distal colonic inflammation on the prior CT appears improved. There is a moderate amount of right-sided colonic stool.   Vascular/Lymphatic: Mild abdominal aortic atherosclerosis without aneurysm. No enlarged lymph nodes.   Reproductive: Grossly unremarkable uterus and adnexa.   Other: At most trace pelvic free fluid.  No pneumoperitoneum.   Musculoskeletal: No acute osseous abnormality or suspicious osseous lesion.   IMPRESSION: 1. Improved distal small bowel and colonic inflammation. No evidence of bowel obstruction. 2. Mildly increased extrahepatic biliary dilatation. Recommend laboratory correlation. 3. Aortic Atherosclerosis  (ICD10-I70.0).     Electronically Signed   By: Logan Bores M.D.   On: 01/19/2021 10:29

## 2021-01-21 ENCOUNTER — Telehealth: Payer: Self-pay | Admitting: *Deleted

## 2021-01-21 ENCOUNTER — Encounter: Payer: Self-pay | Admitting: Emergency Medicine

## 2021-01-21 ENCOUNTER — Other Ambulatory Visit: Payer: Self-pay

## 2021-01-21 ENCOUNTER — Ambulatory Visit: Payer: Medicare Other

## 2021-01-21 DIAGNOSIS — Z79899 Other long term (current) drug therapy: Secondary | ICD-10-CM

## 2021-01-21 DIAGNOSIS — Z880 Allergy status to penicillin: Secondary | ICD-10-CM

## 2021-01-21 DIAGNOSIS — F32A Depression, unspecified: Secondary | ICD-10-CM | POA: Diagnosis present

## 2021-01-21 DIAGNOSIS — G8929 Other chronic pain: Secondary | ICD-10-CM | POA: Diagnosis present

## 2021-01-21 DIAGNOSIS — Z87891 Personal history of nicotine dependence: Secondary | ICD-10-CM

## 2021-01-21 DIAGNOSIS — A0471 Enterocolitis due to Clostridium difficile, recurrent: Principal | ICD-10-CM | POA: Diagnosis present

## 2021-01-21 DIAGNOSIS — Z881 Allergy status to other antibiotic agents status: Secondary | ICD-10-CM

## 2021-01-21 DIAGNOSIS — G40909 Epilepsy, unspecified, not intractable, without status epilepticus: Secondary | ICD-10-CM | POA: Diagnosis present

## 2021-01-21 DIAGNOSIS — Z888 Allergy status to other drugs, medicaments and biological substances status: Secondary | ICD-10-CM

## 2021-01-21 DIAGNOSIS — Z9884 Bariatric surgery status: Secondary | ICD-10-CM

## 2021-01-21 DIAGNOSIS — E162 Hypoglycemia, unspecified: Secondary | ICD-10-CM | POA: Diagnosis present

## 2021-01-21 DIAGNOSIS — Z809 Family history of malignant neoplasm, unspecified: Secondary | ICD-10-CM

## 2021-01-21 DIAGNOSIS — F419 Anxiety disorder, unspecified: Secondary | ICD-10-CM | POA: Diagnosis present

## 2021-01-21 DIAGNOSIS — C539 Malignant neoplasm of cervix uteri, unspecified: Secondary | ICD-10-CM | POA: Diagnosis present

## 2021-01-21 DIAGNOSIS — A084 Viral intestinal infection, unspecified: Secondary | ICD-10-CM | POA: Diagnosis present

## 2021-01-21 DIAGNOSIS — Z885 Allergy status to narcotic agent status: Secondary | ICD-10-CM

## 2021-01-21 DIAGNOSIS — D630 Anemia in neoplastic disease: Secondary | ICD-10-CM | POA: Diagnosis present

## 2021-01-21 DIAGNOSIS — U071 COVID-19: Secondary | ICD-10-CM | POA: Diagnosis present

## 2021-01-21 DIAGNOSIS — E559 Vitamin D deficiency, unspecified: Secondary | ICD-10-CM | POA: Diagnosis present

## 2021-01-21 DIAGNOSIS — Z91041 Radiographic dye allergy status: Secondary | ICD-10-CM

## 2021-01-21 DIAGNOSIS — E538 Deficiency of other specified B group vitamins: Secondary | ICD-10-CM | POA: Diagnosis present

## 2021-01-21 DIAGNOSIS — F1010 Alcohol abuse, uncomplicated: Secondary | ICD-10-CM | POA: Diagnosis present

## 2021-01-21 LAB — COMPREHENSIVE METABOLIC PANEL
ALT: 19 U/L (ref 0–44)
AST: 26 U/L (ref 15–41)
Albumin: 3.5 g/dL (ref 3.5–5.0)
Alkaline Phosphatase: 53 U/L (ref 38–126)
Anion gap: 6 (ref 5–15)
BUN: 18 mg/dL (ref 6–20)
CO2: 27 mmol/L (ref 22–32)
Calcium: 8.7 mg/dL — ABNORMAL LOW (ref 8.9–10.3)
Chloride: 106 mmol/L (ref 98–111)
Creatinine, Ser: 0.68 mg/dL (ref 0.44–1.00)
GFR, Estimated: >60 mL/min (ref >60)
Glucose, Bld: 54 mg/dL — ABNORMAL LOW (ref 70–99)
Potassium: 4.1 mmol/L (ref 3.5–5.1)
Sodium: 139 mmol/L (ref 135–145)
Total Bilirubin: 0.4 mg/dL (ref 0.3–1.2)
Total Protein: 6.6 g/dL (ref 6.5–8.1)

## 2021-01-21 LAB — CBC
HCT: 37.4 % (ref 36.0–46.0)
Hemoglobin: 11.5 g/dL — ABNORMAL LOW (ref 12.0–15.0)
MCH: 25.6 pg — ABNORMAL LOW (ref 26.0–34.0)
MCHC: 30.7 g/dL (ref 30.0–36.0)
MCV: 83.1 fL (ref 80.0–100.0)
Platelets: 250 10*3/uL (ref 150–400)
RBC: 4.5 MIL/uL (ref 3.87–5.11)
WBC: 4.8 10*3/uL (ref 4.0–10.5)
nRBC: 0 % (ref 0.0–0.2)

## 2021-01-21 LAB — HCG, QUANTITATIVE, PREGNANCY: hCG, Beta Chain, Quant, S: 3 m[IU]/mL (ref <5)

## 2021-01-21 LAB — LIPASE, BLOOD: Lipase: 38 U/L (ref 11–51)

## 2021-01-21 NOTE — Telephone Encounter (Signed)
Patient called reporting that her pain is npt being controlled and that she is now waking form dead sleep screaming in pain. SH ehas been to ER about it, but feels like her problem is not being addressed and was told to call us about. Please advise

## 2021-01-21 NOTE — Telephone Encounter (Addendum)
She has called again this time reporting that she has a massive headache and is unable to down food or liquid as well as having "unrelenting pain" and fever of 102

## 2021-01-21 NOTE — ED Triage Notes (Addendum)
Pt c/o right sided abdominal pain x 1 week since being diagnosed with cdiff. Pt was diagnosed covid + on 12/30. Pt has cervical cancer and last chemo was x 2 weeks ago. Pt also c/o difficulty urinating and fevers. Highest temp at home was 101.1 3 hours PTA, pt states she took tylenol

## 2021-01-21 NOTE — Telephone Encounter (Signed)
Called pt and per [facility], she was advised her to go to the ER d/t pain, vomiting and fever. Pt states the narcotics do nothing for her pain. Pt verbalized understanding.

## 2021-01-22 ENCOUNTER — Emergency Department: Payer: Medicare Other

## 2021-01-22 ENCOUNTER — Inpatient Hospital Stay
Admission: EM | Admit: 2021-01-22 | Discharge: 2021-01-25 | DRG: 371 | Disposition: A | Discharge status: Home / Self Care | Payer: Medicare Other | Attending: Internal Medicine | Admitting: Internal Medicine

## 2021-01-22 DIAGNOSIS — G40909 Epilepsy, unspecified, not intractable, without status epilepticus: Secondary | ICD-10-CM

## 2021-01-22 DIAGNOSIS — F32A Depression, unspecified: Secondary | ICD-10-CM | POA: Diagnosis not present

## 2021-01-22 DIAGNOSIS — C539 Malignant neoplasm of cervix uteri, unspecified: Secondary | ICD-10-CM | POA: Diagnosis not present

## 2021-01-22 DIAGNOSIS — Z809 Family history of malignant neoplasm, unspecified: Secondary | ICD-10-CM | POA: Diagnosis not present

## 2021-01-22 DIAGNOSIS — K529 Noninfective gastroenteritis and colitis, unspecified: Secondary | ICD-10-CM

## 2021-01-22 DIAGNOSIS — R1084 Generalized abdominal pain: Secondary | ICD-10-CM | POA: Diagnosis not present

## 2021-01-22 DIAGNOSIS — Z880 Allergy status to penicillin: Secondary | ICD-10-CM | POA: Diagnosis not present

## 2021-01-22 DIAGNOSIS — E538 Deficiency of other specified B group vitamins: Secondary | ICD-10-CM | POA: Diagnosis present

## 2021-01-22 DIAGNOSIS — U071 COVID-19: Secondary | ICD-10-CM | POA: Diagnosis not present

## 2021-01-22 DIAGNOSIS — Z87891 Personal history of nicotine dependence: Secondary | ICD-10-CM | POA: Diagnosis not present

## 2021-01-22 DIAGNOSIS — F419 Anxiety disorder, unspecified: Secondary | ICD-10-CM

## 2021-01-22 DIAGNOSIS — G8929 Other chronic pain: Secondary | ICD-10-CM | POA: Insufficient documentation

## 2021-01-22 DIAGNOSIS — Z888 Allergy status to other drugs, medicaments and biological substances status: Secondary | ICD-10-CM | POA: Diagnosis not present

## 2021-01-22 DIAGNOSIS — K56609 Unspecified intestinal obstruction, unspecified as to partial versus complete obstruction: Secondary | ICD-10-CM | POA: Diagnosis present

## 2021-01-22 DIAGNOSIS — R112 Nausea with vomiting, unspecified: Secondary | ICD-10-CM | POA: Diagnosis not present

## 2021-01-22 DIAGNOSIS — A0472 Enterocolitis due to Clostridium difficile, not specified as recurrent: Principal | ICD-10-CM

## 2021-01-22 DIAGNOSIS — E559 Vitamin D deficiency, unspecified: Secondary | ICD-10-CM | POA: Diagnosis present

## 2021-01-22 DIAGNOSIS — R197 Diarrhea, unspecified: Secondary | ICD-10-CM

## 2021-01-22 DIAGNOSIS — A0471 Enterocolitis due to Clostridium difficile, recurrent: Secondary | ICD-10-CM | POA: Diagnosis present

## 2021-01-22 DIAGNOSIS — Z9884 Bariatric surgery status: Secondary | ICD-10-CM

## 2021-01-22 DIAGNOSIS — Z79899 Other long term (current) drug therapy: Secondary | ICD-10-CM | POA: Diagnosis not present

## 2021-01-22 DIAGNOSIS — F1010 Alcohol abuse, uncomplicated: Secondary | ICD-10-CM | POA: Diagnosis present

## 2021-01-22 DIAGNOSIS — Z91041 Radiographic dye allergy status: Secondary | ICD-10-CM | POA: Diagnosis not present

## 2021-01-22 DIAGNOSIS — Z881 Allergy status to other antibiotic agents status: Secondary | ICD-10-CM | POA: Diagnosis not present

## 2021-01-22 DIAGNOSIS — E162 Hypoglycemia, unspecified: Secondary | ICD-10-CM | POA: Diagnosis present

## 2021-01-22 DIAGNOSIS — A084 Viral intestinal infection, unspecified: Secondary | ICD-10-CM | POA: Diagnosis present

## 2021-01-22 DIAGNOSIS — Z885 Allergy status to narcotic agent status: Secondary | ICD-10-CM | POA: Diagnosis not present

## 2021-01-22 DIAGNOSIS — R52 Pain, unspecified: Secondary | ICD-10-CM

## 2021-01-22 DIAGNOSIS — D630 Anemia in neoplastic disease: Secondary | ICD-10-CM | POA: Diagnosis present

## 2021-01-22 LAB — BASIC METABOLIC PANEL
Anion gap: 7 (ref 5–15)
BUN: 12 mg/dL (ref 6–20)
CO2: 24 mmol/L (ref 22–32)
Calcium: 8.5 mg/dL — ABNORMAL LOW (ref 8.9–10.3)
Chloride: 104 mmol/L (ref 98–111)
Creatinine, Ser: 0.57 mg/dL (ref 0.44–1.00)
GFR, Estimated: >60 mL/min (ref >60)
Glucose, Bld: 85 mg/dL (ref 70–99)
Potassium: 4.5 mmol/L (ref 3.5–5.1)
Sodium: 135 mmol/L (ref 135–145)

## 2021-01-22 LAB — CBC
HCT: 31.1 % — ABNORMAL LOW (ref 36.0–46.0)
Hemoglobin: 9.9 g/dL — ABNORMAL LOW (ref 12.0–15.0)
MCH: 27 pg (ref 26.0–34.0)
MCHC: 31.8 g/dL (ref 30.0–36.0)
MCV: 85 fL (ref 80.0–100.0)
Platelets: 202 10*3/uL (ref 150–400)
RBC: 3.66 MIL/uL — ABNORMAL LOW (ref 3.87–5.11)
WBC: 3 10*3/uL — ABNORMAL LOW (ref 4.0–10.5)
nRBC: 0 % (ref 0.0–0.2)

## 2021-01-22 LAB — URINALYSIS, ROUTINE W REFLEX MICROSCOPIC
Bacteria, UA: NONE SEEN
Bilirubin Urine: NEGATIVE
Glucose, UA: NEGATIVE mg/dL
Ketones, ur: NEGATIVE mg/dL
Nitrite: NEGATIVE
Protein, ur: NEGATIVE mg/dL
RBC / HPF: >50 RBC/hpf — ABNORMAL HIGH (ref 0–5)
Specific Gravity, Urine: 1.013 (ref 1.005–1.030)
pH: 7 (ref 5.0–8.0)

## 2021-01-22 LAB — MAGNESIUM: Magnesium: 1.9 mg/dL (ref 1.7–2.4)

## 2021-01-22 LAB — CBG MONITORING, ED: Glucose-Capillary: 96 mg/dL (ref 70–99)

## 2021-01-22 MED ORDER — ESCITALOPRAM OXALATE 10 MG PO TABS
10.0000 mg | ORAL_TABLET | Freq: Every day | ORAL | Status: DC
  Administered 2021-01-23 – 2021-01-24 (×2): 10 mg via ORAL
  Filled 2021-01-22 (×2): qty 1

## 2021-01-22 MED ORDER — ALPRAZOLAM 0.5 MG PO TABS
0.5000 mg | ORAL_TABLET | Freq: Two times a day (BID) | ORAL | Status: DC
  Administered 2021-01-22 – 2021-01-25 (×7): 0.5 mg via ORAL
  Filled 2021-01-22 (×7): qty 1

## 2021-01-22 MED ORDER — PROCHLORPERAZINE EDISYLATE 10 MG/2ML IJ SOLN
10.0000 mg | Freq: Four times a day (QID) | INTRAMUSCULAR | Status: DC | PRN
  Administered 2021-01-22 – 2021-01-25 (×5): 10 mg via INTRAVENOUS
  Filled 2021-01-22 (×7): qty 2

## 2021-01-22 MED ORDER — HYDROMORPHONE HCL 1 MG/ML IJ SOLN
1.0000 mg | INTRAMUSCULAR | Status: DC | PRN
  Filled 2021-01-22: qty 1

## 2021-01-22 MED ORDER — PANTOPRAZOLE SODIUM 40 MG IV SOLR
40.0000 mg | Freq: Two times a day (BID) | INTRAVENOUS | Status: DC
  Administered 2021-01-22 – 2021-01-23 (×2): 40 mg via INTRAVENOUS
  Filled 2021-01-22 (×2): qty 40

## 2021-01-22 MED ORDER — REMDESIVIR 100 MG IV SOLR
100.0000 mg | Freq: Every day | INTRAVENOUS | Status: DC
  Administered 2021-01-23 – 2021-01-24 (×2): 100 mg via INTRAVENOUS
  Filled 2021-01-22 (×2): qty 100
  Filled 2021-01-22 (×2): qty 20

## 2021-01-22 MED ORDER — METRONIDAZOLE 500 MG/100ML IV SOLN
500.0000 mg | Freq: Three times a day (TID) | INTRAVENOUS | Status: DC
  Administered 2021-01-22 – 2021-01-23 (×4): 500 mg via INTRAVENOUS
  Filled 2021-01-22 (×5): qty 100

## 2021-01-22 MED ORDER — ONDANSETRON HCL 4 MG/2ML IJ SOLN
4.0000 mg | Freq: Once | INTRAMUSCULAR | Status: AC
  Administered 2021-01-22: 4 mg via INTRAVENOUS
  Filled 2021-01-22: qty 2

## 2021-01-22 MED ORDER — HYDROMORPHONE HCL 1 MG/ML IJ SOLN
1.0000 mg | INTRAMUSCULAR | Status: DC | PRN
Start: 2021-01-22 — End: 2021-01-25
  Administered 2021-01-22 – 2021-01-25 (×20): 1 mg via INTRAVENOUS
  Filled 2021-01-22 (×20): qty 1

## 2021-01-22 MED ORDER — ACETAMINOPHEN 325 MG PO TABS: 650.0000 mg | ORAL_TABLET | Freq: Four times a day (QID) | ORAL | Status: DC | PRN

## 2021-01-22 MED ORDER — LORAZEPAM 2 MG/ML IJ SOLN
1.0000 mg | Freq: Once | INTRAMUSCULAR | Status: AC
  Administered 2021-01-22: 1 mg via INTRAVENOUS
  Filled 2021-01-22: qty 1

## 2021-01-22 MED ORDER — THIAMINE HCL 100 MG/ML IJ SOLN
100.0000 mg | Freq: Every day | INTRAMUSCULAR | Status: DC
  Administered 2021-01-22 – 2021-01-25 (×4): 100 mg via INTRAVENOUS
  Filled 2021-01-22 (×4): qty 2

## 2021-01-22 MED ORDER — SODIUM CHLORIDE 0.9 % IV SOLN
200.0000 mg | Freq: Once | INTRAVENOUS | Status: AC
  Administered 2021-01-22: 200 mg via INTRAVENOUS
  Filled 2021-01-22: qty 200

## 2021-01-22 MED ORDER — ENOXAPARIN SODIUM 40 MG/0.4ML IJ SOSY
40.0000 mg | PREFILLED_SYRINGE | Freq: Every day | INTRAMUSCULAR | Status: DC
  Administered 2021-01-22 – 2021-01-24 (×3): 40 mg via SUBCUTANEOUS
  Filled 2021-01-22 (×3): qty 0.4

## 2021-01-22 MED ORDER — HYDROMORPHONE HCL 1 MG/ML IJ SOLN
0.5000 mg | INTRAMUSCULAR | Status: DC | PRN
Start: 2021-01-22 — End: 2021-01-22
  Administered 2021-01-22 (×2): 0.5 mg via INTRAVENOUS
  Filled 2021-01-22 (×2): qty 1

## 2021-01-22 MED ORDER — LACTATED RINGERS IV BOLUS
1000.0000 mL | Freq: Once | INTRAVENOUS | Status: AC
  Administered 2021-01-22: 1000 mL via INTRAVENOUS

## 2021-01-22 MED ORDER — VANCOMYCIN HCL 500 MG IV SOLR
500.0000 mg | Freq: Four times a day (QID) | Status: DC
  Administered 2021-01-22 (×2): 500 mg via RECTAL
  Filled 2021-01-22 (×6): qty 10

## 2021-01-22 MED ORDER — BENZOCAINE 20 % MT AERO
INHALATION_SPRAY | Freq: Once | OROMUCOSAL | Status: AC
  Filled 2021-01-22: qty 57

## 2021-01-22 MED ORDER — METRONIDAZOLE 500 MG/100ML IV SOLN
500.0000 mg | Freq: Once | INTRAVENOUS | Status: AC
  Administered 2021-01-22: 500 mg via INTRAVENOUS
  Filled 2021-01-22: qty 100

## 2021-01-22 MED ORDER — LAMOTRIGINE 25 MG PO TABS
50.0000 mg | ORAL_TABLET | Freq: Every day | ORAL | Status: DC
  Administered 2021-01-22 – 2021-01-25 (×4): 50 mg via ORAL
  Filled 2021-01-22 (×4): qty 2

## 2021-01-22 MED ORDER — OXYCODONE-ACETAMINOPHEN 5-325 MG PO TABS
1.0000 | ORAL_TABLET | Freq: Four times a day (QID) | ORAL | Status: DC | PRN
Start: 2021-01-22 — End: 2021-01-25
  Administered 2021-01-23 – 2021-01-25 (×10): 1 via ORAL
  Filled 2021-01-22 (×10): qty 1

## 2021-01-22 MED ORDER — HYDROMORPHONE HCL 1 MG/ML IJ SOLN
1.0000 mg | Freq: Once | INTRAMUSCULAR | Status: AC
  Administered 2021-01-22: 1 mg via INTRAVENOUS
  Filled 2021-01-22: qty 1

## 2021-01-22 MED ORDER — FENTANYL CITRATE PF 50 MCG/ML IJ SOSY
25.0000 ug | PREFILLED_SYRINGE | INTRAMUSCULAR | Status: DC | PRN
  Administered 2021-01-22 (×3): 25 ug via INTRAVENOUS
  Filled 2021-01-22 (×3): qty 1

## 2021-01-22 MED ORDER — FENTANYL 25 MCG/HR TD PT72
1.0000 | MEDICATED_PATCH | TRANSDERMAL | Status: DC
  Administered 2021-01-22 – 2021-01-25 (×2): 1 via TRANSDERMAL
  Filled 2021-01-22 (×2): qty 1

## 2021-01-22 MED ORDER — LACTATED RINGERS IV SOLN: INTRAVENOUS | Status: DC

## 2021-01-22 NOTE — ED Notes (Signed)
Pt able to urinate small amount for urinalysis. Urine sent to lab.

## 2021-01-22 NOTE — Progress Notes (Signed)
Patient ID: Vanessa Romero, female   DOB: 04-21-1975, 46 y.o.   MRN: [id number] [facility] PROGRESS NOTE  Raelea Gosse NLZ:[id number] DOB: 16-Dec-1975 DOA: 01/22/2021 PCP: Anselmo Pickler, MD  HPI/Subjective: Patient feeling miserable.  Had nausea vomiting this morning.  Having severe abdominal pain.  Still having diarrhea.  Recently diagnosed with C. difficile colitis.  Receiving chemo and radiation treatment for cervical cancer.  Objective: Vitals:   01/22/21 1200 01/22/21 1315  BP: 116/84 108/69  Pulse: 70 64  Resp: 13 14  Temp:    SpO2: 99% 100%    Intake/Output Summary (Last 24 hours) at 01/22/2021 1339 Last data filed at 01/22/2021 1034 Gross per 24 hour  Intake 250 ml  Output --  Net 250 ml   Filed Weights   01/21/21 1930  Weight: 54.4 kg    ROS: Review of Systems  Respiratory:  Negative for shortness of breath.   Cardiovascular:  Negative for chest pain.  Gastrointestinal:  Positive for abdominal pain, diarrhea, nausea and vomiting.  Exam: Physical Exam HENT:     Head: Normocephalic.     Mouth/Throat:     Pharynx: No oropharyngeal exudate.  Eyes:     General: Lids are normal.     Conjunctiva/sclera: Conjunctivae normal.  Cardiovascular:     Rate and Rhythm: Normal rate and regular rhythm.     Heart sounds: Normal heart sounds, S1 normal and S2 normal.  Pulmonary:     Breath sounds: No decreased breath sounds, wheezing, rhonchi or rales.  Abdominal:     Palpations: Abdomen is soft.     Tenderness: There is generalized abdominal tenderness.  Musculoskeletal:     Right lower leg: No swelling.     Left lower leg: No swelling.  Skin:    General: Skin is warm.     Findings: No rash.  Neurological:     Mental Status: She is alert and oriented to person, place, and time.      Scheduled Meds:  ALPRAZolam  0.5 mg Oral BID   enoxaparin (LOVENOX) injection  40 mg Subcutaneous Daily   fentaNYL  1 patch Transdermal Q72H   vancomycin (VANCOCIN)  rectal ENEMA  500 mg Rectal Q6H   Continuous Infusions:  lactated ringers 50 mL/hr at 01/22/21 1104   metronidazole Stopped (01/22/21 0926)   [START ON 01/23/2021] remdesivir 100 mg in NS 100 mL      Assessment/Plan:  C. difficile colitis.  Severe abdominal pain.  Nausea vomiting and diarrhea.  We will give vancomycin rectal enemas and IV Flagyl for right now.  Case discussed with general surgery and they do not think that this is a small bowel obstruction.  Since the patient is having diarrhea, I agree with this assessment.  Hopefully can change over to p.o. Dificid once not vomiting.  As needed IV Dilaudid. COVID-19 virus infection possible gastroenteritis.  Remdesivir ordered. Cervical cancer stage IIb.  Receiving chemo and radiation therapy. Chronic pain on Duragesic patch. History of seizure disorder restart Lamictal Anxiety and depression on Xanax and Lexapro Prior history of alcohol abuse.  Will hopefully restart thiamine once eating. Vitamin B12 deficiency Vitamin D deficiency     Code Status:     Code Status Orders  (From admission, onward)           Start     Ordered   01/22/21 0253  Full code  Continuous        01/22/21 0254  Code Status History     Date Active Date Inactive Code Status Order ID Comments User Context   01/07/2021 2147 01/14/2021 1947 Full Code [id number]  Bernadette Hoit, DO ED   11/22/2020 1306 11/27/2020 1931 Full Code [id number]  [facility], Briant Cedar, DO ED       Disposition Plan: Status is: Inpatient  Cincere Zorn [facility]  [facility]

## 2021-01-22 NOTE — H&P (Signed)
History and Physical    Vanessa Romero YBO:[id number] DOB: Jun 09, 1975 DOA: 01/22/2021  PCP: Anselmo Pickler, MD   Patient coming from: home  I have personally briefly reviewed patient's relevant medical records in [place]  Chief Complaint: abdominal pain  HPI: Vanessa Romero is a 46 y.o. 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 difficile colitis of the descending colon treated with and discharged on oral vancomycin, with continued diarrhea at discharge treated with as needed loperamide, who presents to the ED for the second time in the past week with increasing right-sided abdominal pain associated with continued and worsening diarrhea in spite of an increase in her vancomycin dose from 125 mg to 250 mg..  When seen on 1/4 , CT scan of the abdomen showed improvement however in the past 2 days, she started vomiting and is now unable to keep down any of her medications.  She also reports that she tested positive for COVID on 12/31.  ED course: Vitals WNL Blood work:unremarkable  Imaging: CT abdomen with moderate severity enteritis and possible distal small bowel obstruction  Patient was treated with IV fluid bolus, antiemetics and pain meds.  NG tube ordered.  Hospitalist consulted for admission.  Review of Systems: As per HPI otherwise all other systems on review of systems negative.   Assessment/Plan  SBO, with history of mesenteric volvulus with ileal resection in 2014 Acute gastroenteritis -Acute enteritis possibly related to C. difficile versus COVID gastroenteritis - Continue NG tube to low intermittent suction - IV antiemetics, IV pain meds - Surgical consult to follow    Clostridium difficile colitis -Patient unable to tolerate orally - ID consult to discuss  options for continued management    COVID-19 virus infection possible COVID gastroenteritis - COVID PCR positive on 01/15/2021 - Remdesivir - Routine COVID precautions    History of Roux-en-Y gastric bypass - No complications suspected    Cervical cancer, FIGO stage IIB (HCC) - Currently on chemo and XRT   DVT prophylaxis: Lovenox  Code Status: full code  Family Communication:  none  Disposition Plan: Back to previous home environment Consults called: none  Status:At the time of admission, it appears that the appropriate admission status for this patient is INPATIENT. This is judged to be reasonable and necessary in order to provide the required intensity of service to ensure the patient's safety given the presenting symptoms, physical exam findings, and initial radiographic and laboratory data in the context of their  Comorbid conditions.   Patient requires inpatient status due to high intensity of service, high risk for further deterioration and high frequency of surveillance required.   I certify that at the point of admission it is my clinical judgment that the patient will require inpatient hospital care spanning beyond 2 midnights     Physical Exam: Vitals:   01/21/21 1930  BP: 110/65  Pulse: 95  Resp: 18  Temp: 97.7 F (36.5 C)  SpO2: 98%  Weight: 54.4 kg  Height: 5\' 7"  (1.702 m)   Constitutional: Alert, oriented x 3 . Not in any apparent distress HEENT:      Head: Normocephalic and atraumatic.         Eyes: PERLA, EOMI, Conjunctivae are normal. Sclera is non-icteric.       Mouth/Throat: Mucous membranes are moist.  Neck: Supple with no signs of meningismus. Cardiovascular: Regular rate and rhythm. No murmurs, gallops, or rubs. 2+ symmetrical distal pulses are present . No JVD. No  LE edema Respiratory: Respiratory effort normal .Lungs sounds clear bilaterally. No wheezes, crackles, or rhonchi.  Gastrointestinal: Soft, right-sided tenderness, mildly  hyperactive distended. Positive bowel sounds.  Genitourinary: No CVA tenderness. Musculoskeletal: Nontender with normal range of motion in all extremities. No cyanosis, or erythema of extremities. Neurologic:  Face is symmetric. Moving all extremities. No gross focal neurologic deficits . Skin: Skin is warm, dry.  No rash or ulcers Psychiatric: Mood and affect are appropriate     Past Medical History:  Diagnosis Date   Alcohol abuse    Anemia    Cancer ([place])    Tobacco dependence     Past Surgical History:  Procedure Laterality Date   ABDOMINAL ADHESION SURGERY     bowel obstruction     x2   CERVICAL CONIZATION W/BX N/A 11/26/2020   Procedure: CONIZATION CERVIX WITH BIOPSY;  Surgeon: Malachy Mood, MD;  Location: [facility] ORS;  Service: Gynecology;  Laterality: N/A;   ESOPHAGOGASTRODUODENOSCOPY N/A 11/24/2020   Procedure: ESOPHAGOGASTRODUODENOSCOPY (EGD);  Surgeon: Lin Landsman, MD;  Location: [facility] ENDOSCOPY;  Service: Gastroenterology;  Laterality: N/A;   laparoscopic knee surgery     PORTA CATH INSERTION N/A 12/20/2020   Procedure: PORTA CATH INSERTION;  Surgeon: Algernon Huxley, MD;  Location: [facility];  Service: Cardiovascular;  Laterality: N/A;   ROUX-EN-Y GASTRIC BYPASS     TEAR DUCT PROBING     unclogg   VAGOTOMY     VENTRICULOPERITONEAL SHUNT     x6 put in and removals     reports that she has quit smoking. Her smoking use included cigarettes. She has quit using smokeless tobacco. She reports that she does not currently use alcohol. She reports that she does not currently use drugs.  Allergies  Allergen Reactions   Contrast Media [Iodinated Contrast Media] Hives   Gabapentin Other (See Comments), Rash and Palpitations    Other Reaction: tachycardia Other Reaction: tachycardia    Morphine Dermatitis, Hives, Rash, Swelling and Other (See Comments)    Other reaction(s): Unknown (comments) Has tolerated hydromorphone (Dilaudid) Immediate after  injections arm edema and arm turned bright red Immediate after injections arm edema and arm turned bright red IV Morphine IV Morphine    Sumatriptan Dermatitis, Hives, Itching, Other (See Comments) and Swelling    Other reaction(s): Joint Pain, Other (See Comments), Other (see comments), Unknown (comments) lock jaw Lock jaw Lock jaw Lock jaw TIGHTENING OF JAW Lock jaw lock jaw Lock jaw TIGHTENING OF JAW Lock jaw    Zolpidem Nausea And Vomiting and Other (See Comments)    Other reaction(s): Other (see comments) sleep walking sleep walking Sleep walking  don't tolerate it well    Erythromycin Diarrhea, Nausea And Vomiting and Nausea Only    Extreme upset stomach    Valproic Acid Rash    Other reaction(s): Other (see comments), Unknown MOOD DISORDER MOOD DISORDER Depakote: Reaction unknown     Acetazolamide     Other reaction(s): Unknown (comments)   Erythromycin Base     Other reaction(s): UNKNOWN   Amoxicillin Rash   Divalproex Sodium Anxiety and Other (See Comments)    Family History  Problem Relation Age of Onset   Cancer Mother    Cancer Father    Cancer Maternal Grandmother       Prior to Admission medications  Medication Sig Start Date End Date Taking? Authorizing Provider  ALPRAZolam Duanne Moron) 0.5 MG tablet Take 1 tablet (0.5 mg total) by mouth 2 (two) times daily as needed for up to 5 days for anxiety. 01/18/21 01/23/21  Borders, Kirt Boys, NP  benzonatate (TESSALON) 200 MG capsule Take 1 capsule (200 mg total) by mouth 3 (three) times daily as needed for cough. 01/15/21   Melynda Ripple, MD  cholecalciferol (VITAMIN D) 25 MCG tablet Take 1 tablet (1,000 Units total) by mouth daily. 11/27/20   Loletha Grayer, MD  cyanocobalamin 1000 MCG tablet Take 1 tablet (1,000 mcg total) by mouth daily. 01/04/21   Jacquelin Hawking, NP  dicyclomine (BENTYL) 10 MG capsule Take 1 capsule (10 mg total) by mouth 3 (three) times daily as needed (abd pain). 01/06/21    Nance Pear, MD  escitalopram (LEXAPRO) 20 MG tablet Take 1 tablet (20 mg total) by mouth daily at 12 noon. 11/27/20   Loletha Grayer, MD  feeding supplement (ENSURE ENLIVE / ENSURE PLUS) LIQD Take 237 mLs by mouth 3 (three) times daily between meals. 11/27/20   Loletha Grayer, MD  fentaNYL (DURAGESIC) 25 MCG/HR Place 1 patch onto the skin every 3 (three) days. 01/18/21   Borders, Kirt Boys, NP  fluticasone (FLONASE) 50 MCG/ACT nasal spray Place 2 sprays into both nostrils daily. 01/15/21   Melynda Ripple, MD  folic acid (FOLVITE) 1 MG tablet Take 1 tablet (1 mg total) by mouth daily. 01/04/21   Jacquelin Hawking, NP  HYDROcodone-acetaminophen (NORCO) 10-325 MG tablet Take 1 tablet by mouth every 6 (six) hours as needed for up to 5 days for moderate pain or severe pain. 01/18/21 01/23/21  Borders, Kirt Boys, NP  lamoTRIgine (LAMICTAL) 25 MG tablet Take 2 tablets (50 mg total) by mouth daily. 11/27/20   Loletha Grayer, MD  lidocaine-prilocaine (EMLA) cream Apply to affected area once 12/07/20   Lloyd Huger, MD  Multiple Vitamin (MULTIVITAMIN WITH MINERALS) TABS tablet Take 1 tablet by mouth daily. 11/27/20   Loletha Grayer, MD  naloxone [facility]) nasal spray 4 mg/0.1 mL SPRAY 1 SPRAY INTO ONE NOSTRIL AS DIRECTED FOR OPIOID OVERDOSE (TURN PERSON ON SIDE AFTER DOSE. IF NO RESPONSE IN 2-3 MINUTES OR PERSON RESPONDS BUT RELAPSES, REPEAT USING A NEW SPRAY DEVICE AND SPRAY INTO THE OTHER NOSTRIL. CALL 911 AFTER USE.) * EMERGENCY USE ONLY * 01/18/21   Borders, Kirt Boys, NP  ondansetron (ZOFRAN) 8 MG tablet Take 1 tablet (8 mg total) by mouth 2 (two) times daily as needed for refractory nausea / vomiting. 12/07/20   Lloyd Huger, MD  oxyCODONE-acetaminophen (PERCOCET) 5-325 MG tablet Take 1 tablet by mouth every 4 (four) hours as needed for severe pain. 01/19/21 01/19/22  Blake Divine, MD  prochlorperazine (COMPAZINE) 10 MG tablet Take 1 tablet (10 mg total) by mouth every 6 (six) hours as  needed (Nausea or vomiting). 12/07/20   Lloyd Huger, MD  sucralfate (CARAFATE) 1 g tablet Take 1 tablet (1 g total) by mouth 3 (three) times daily. 01/04/21   Noreene Filbert, MD  thiamine 100 MG tablet Take 1 tablet (100 mg total) by mouth daily. 11/27/20   Loletha Grayer, MD      Labs on Admission: I have personally reviewed following labs and imaging studies  CBC: Recent Labs  Lab 01/19/21 0642 01/21/21 1934  WBC 3.1* 4.8  NEUTROABS 1.6*  --   HGB 10.9* 11.5*  HCT 33.9* 37.4  MCV 80.5 83.1  PLT 234 250  Basic Metabolic Panel: Recent Labs  Lab 01/19/21 0642 01/21/21 1934  NA 133* 139  K 3.5 4.1  CL 103 106  CO2 22 27  GLUCOSE 144* 54*  BUN 14 18  CREATININE 0.85 0.68  CALCIUM 8.4* 8.7*  MG  --  1.9   GFR: Estimated Creatinine Clearance: 77.1 mL/min (by C-G formula based on SCr of 0.68 mg/dL). Liver Function Tests: Recent Labs  Lab 01/19/21 0642 01/21/21 1934  AST 27 26  ALT 21 19  ALKPHOS 53 53  BILITOT 0.4 0.4  PROT 6.2* 6.6  ALBUMIN 3.3* 3.5   Recent Labs  Lab 01/19/21 0642 01/21/21 1934  LIPASE 37 38   No results for input(s): AMMONIA in the last 168 hours. Coagulation Profile: No results for input(s): INR, PROTIME in the last 168 hours. Cardiac Enzymes: No results for input(s): CKTOTAL, CKMB, CKMBINDEX, TROPONINI in the last 168 hours. BNP (last 3 results) No results for input(s): PROBNP in the last 8760 hours. HbA1C: No results for input(s): HGBA1C in the last 72 hours. CBG: Recent Labs  Lab 01/22/21 0045  GLUCAP 96   Lipid Profile: No results for input(s): CHOL, HDL, LDLCALC, TRIG, CHOLHDL, LDLDIRECT in the last 72 hours. Thyroid Function Tests: No results for input(s): TSH, T4TOTAL, FREET4, T3FREE, THYROIDAB in the last 72 hours. Anemia Panel: No results for input(s): VITAMINB12, FOLATE, FERRITIN, TIBC, IRON, RETICCTPCT in the last 72 hours. Urine analysis:    Component Value Date/Time   COLORURINE AMBER (A)  01/19/2021 0644   APPEARANCEUR CLOUDY (A) 01/19/2021 0644   LABSPEC 1.041 (H) 01/19/2021 0644   PHURINE 5.0 01/19/2021 0644   GLUCOSEU NEGATIVE 01/19/2021 0644   HGBUR LARGE (A) 01/19/2021 0644   BILIRUBINUR SMALL (A) 01/19/2021 0644   KETONESUR 5 (A) 01/19/2021 0644   PROTEINUR 100 (A) 01/19/2021 0644   NITRITE NEGATIVE 01/19/2021 0644   LEUKOCYTESUR TRACE (A) 01/19/2021 0644    Radiological Exams on Admission: No results found.     Athena Masse MD [facility]   01/22/2021, 1:25 AM

## 2021-01-22 NOTE — ED Notes (Signed)
Patient reports lower right abdominal pain 8/10. She also c/o pain in right hip and across entire lower back. She reports vomiting and that she feels C.diff has not improved. Pt reports blood glucoses normally can run low at times and she is asymptomatic.

## 2021-01-22 NOTE — ED Notes (Signed)
Pt ambulatory to the restroom at this time. Pt is A&Ox4 and NAD, pr requesting pain medication.

## 2021-01-22 NOTE — ED Provider Notes (Signed)
[facility] Provider Note    Event Date/Time   First MD Initiated Contact with Patient 01/22/21 0007     (approximate)   History   Abdominal Pain   HPI  Ruthellen Tippy is a 46 y.o. female history of cervical cancer on chemotherapy and radiation, gastric bypass who presents to the emergency department with diffuse and severe abdominal pain, nausea, vomiting and diarrhea.  Was diagnosed with C. difficile on 01/05/2021.  She was admitted to the hospital for failing outpatient oral vancomycin on 01/07/2021 to 01/14/2021.  States she continues to have diarrhea and had her vancomycin dose recently increased from 125 mg to 250 mg but she continues to have copious diarrhea and now her pain is worsening despite multiple medications at home.  She is also started to have vomiting and is unable to keep any food or water down nor any of her medications.  Also recently had COVID-19 about a week ago.  No cough, chest pain or shortness of breath.   History provided by patient.      Past Medical History:  Diagnosis Date   Alcohol abuse    Anemia    Cancer ([place])    Tobacco dependence     Past Surgical History:  Procedure Laterality Date   ABDOMINAL ADHESION SURGERY     bowel obstruction     x2   CERVICAL CONIZATION W/BX N/A 11/26/2020   Procedure: CONIZATION CERVIX WITH BIOPSY;  Surgeon: Malachy Mood, MD;  Location: [facility] ORS;  Service: Gynecology;  Laterality: N/A;   ESOPHAGOGASTRODUODENOSCOPY N/A 11/24/2020   Procedure: ESOPHAGOGASTRODUODENOSCOPY (EGD);  Surgeon: Lin Landsman, MD;  Location: [facility] ENDOSCOPY;  Service: Gastroenterology;  Laterality: N/A;   laparoscopic knee surgery     PORTA CATH INSERTION N/A 12/20/2020   Procedure: PORTA CATH INSERTION;  Surgeon: Algernon Huxley, MD;  Location: [facility] LAB;  Service: Cardiovascular;  Laterality: N/A;   ROUX-EN-Y GASTRIC BYPASS     TEAR DUCT PROBING     unclogg   VAGOTOMY      VENTRICULOPERITONEAL SHUNT     x6 put in and removals    MEDICATIONS:  Prior to Admission medications   Medication Sig Start Date End Date Taking? Authorizing Provider  ALPRAZolam Duanne Moron) 0.5 MG tablet Take 1 tablet (0.5 mg total) by mouth 2 (two) times daily as needed for up to 5 days for anxiety. 01/18/21 01/23/21  Borders, Kirt Boys, NP  benzonatate (TESSALON) 200 MG capsule Take 1 capsule (200 mg total) by mouth 3 (three) times daily as needed for cough. 01/15/21   Melynda Ripple, MD  cholecalciferol (VITAMIN D) 25 MCG tablet Take 1 tablet (1,000 Units total) by mouth daily. 11/27/20   Loletha Grayer, MD  cyanocobalamin 1000 MCG tablet Take 1 tablet (1,000 mcg total) by mouth daily. 01/04/21   Jacquelin Hawking, NP  dicyclomine (BENTYL) 10 MG capsule Take 1 capsule (10 mg total) by mouth 3 (three) times daily as needed (abd pain). 01/06/21   Nance Pear, MD  escitalopram (LEXAPRO) 20 MG tablet Take 1 tablet (20 mg total) by mouth daily at 12 noon. 11/27/20   Loletha Grayer, MD  feeding supplement (ENSURE ENLIVE / ENSURE PLUS) LIQD Take 237 mLs by mouth 3 (three) times daily between meals. 11/27/20   Loletha Grayer, MD  fentaNYL (DURAGESIC) 25 MCG/HR Place 1 patch onto the skin every 3 (three) days. 01/18/21   Borders, Kirt Boys, NP  fluticasone (FLONASE) 50 MCG/ACT nasal spray Place 2 sprays  into both nostrils daily. 01/15/21   Melynda Ripple, MD  folic acid (FOLVITE) 1 MG tablet Take 1 tablet (1 mg total) by mouth daily. 01/04/21   Jacquelin Hawking, NP  HYDROcodone-acetaminophen (NORCO) 10-325 MG tablet Take 1 tablet by mouth every 6 (six) hours as needed for up to 5 days for moderate pain or severe pain. 01/18/21 01/23/21  Borders, Kirt Boys, NP  lamoTRIgine (LAMICTAL) 25 MG tablet Take 2 tablets (50 mg total) by mouth daily. 11/27/20   Loletha Grayer, MD  lidocaine-prilocaine (EMLA) cream Apply to affected area once 12/07/20   Lloyd Huger, MD  Multiple Vitamin (MULTIVITAMIN  WITH MINERALS) TABS tablet Take 1 tablet by mouth daily. 11/27/20   Loletha Grayer, MD  naloxone [facility]) nasal spray 4 mg/0.1 mL SPRAY 1 SPRAY INTO ONE NOSTRIL AS DIRECTED FOR OPIOID OVERDOSE (TURN PERSON ON SIDE AFTER DOSE. IF NO RESPONSE IN 2-3 MINUTES OR PERSON RESPONDS BUT RELAPSES, REPEAT USING A NEW SPRAY DEVICE AND SPRAY INTO THE OTHER NOSTRIL. CALL 911 AFTER USE.) * EMERGENCY USE ONLY * 01/18/21   Borders, Kirt Boys, NP  ondansetron (ZOFRAN) 8 MG tablet Take 1 tablet (8 mg total) by mouth 2 (two) times daily as needed for refractory nausea / vomiting. 12/07/20   Lloyd Huger, MD  oxyCODONE-acetaminophen (PERCOCET) 5-325 MG tablet Take 1 tablet by mouth every 4 (four) hours as needed for severe pain. 01/19/21 01/19/22  Blake Divine, MD  prochlorperazine (COMPAZINE) 10 MG tablet Take 1 tablet (10 mg total) by mouth every 6 (six) hours as needed (Nausea or vomiting). 12/07/20   Lloyd Huger, MD  sucralfate (CARAFATE) 1 g tablet Take 1 tablet (1 g total) by mouth 3 (three) times daily. 01/04/21   Noreene Filbert, MD  thiamine 100 MG tablet Take 1 tablet (100 mg total) by mouth daily. 11/27/20   Loletha Grayer, MD    Physical Exam   Triage Vital Signs: ED Triage Vitals [01/21/21 1930]  Enc Vitals Group     BP 110/65     Pulse Rate 95     Resp 18     Temp 97.7 F (36.5 C)     Temp src      SpO2 98 %     Weight 119 lb 14.9 oz (54.4 kg)     Height 5\' 7"  (1.702 m)     Head Circumference      Peak Flow      Pain Score 8     Pain Loc      Pain Edu?      Excl. in [place]?     Most recent vital signs: Vitals:   01/21/21 1930  BP: 110/65  Pulse: 95  Resp: 18  Temp: 97.7 F (36.5 C)  SpO2: 98%    CONSTITUTIONAL: Alert and oriented and responds appropriately to questions. Well-appearing; well-nourished HEAD: Normocephalic, atraumatic EYES: Conjunctivae clear, pupils appear equal, sclera nonicteric ENT: normal nose; moist mucous membranes NECK: Supple, normal ROM CARD:  RRR; S1 and S2 appreciated; no murmurs, no clicks, no rubs, no gallops RESP: Normal chest excursion without splinting or tachypnea; breath sounds clear and equal bilaterally; no wheezes, no rhonchi, no rales, no hypoxia or respiratory distress, speaking full sentences ABD/GI: Normal bowel sounds; non-distended; soft, non-tender, no rebound, no guarding, no peritoneal signs BACK: The back appears normal EXT: Normal ROM in all joints; no deformity noted, no edema; no cyanosis SKIN: Normal color for age and race; warm; no rash on exposed skin NEURO:  Moves all extremities equally, normal speech PSYCH: The patient's mood and manner are appropriate.   ED Results / Procedures / Treatments   LABS: (all labs ordered are listed, but only abnormal results are displayed) Labs Reviewed  COMPREHENSIVE METABOLIC PANEL - Abnormal; Notable for the following components:      Result Value   Glucose, Bld 54 (*)    Calcium 8.7 (*)    All other components within normal limits  CBC - Abnormal; Notable for the following components:   Hemoglobin 11.5 (*)    MCH 25.6 (*)    All other components within normal limits  URINALYSIS, ROUTINE W REFLEX MICROSCOPIC - Abnormal; Notable for the following components:   Color, Urine YELLOW (*)    APPearance CLEAR (*)    Hgb urine dipstick MODERATE (*)    Leukocytes,Ua TRACE (*)    RBC / HPF >50 (*)    All other components within normal limits  LIPASE, BLOOD  HCG, QUANTITATIVE, PREGNANCY  MAGNESIUM  CBG MONITORING, ED     EKG:  RADIOLOGY: My personal review and interpretation of imaging: CT showed persistent bowel inflammation and now developing a bowel obstruction.  I have personally reviewed all radiology reports.   CT ABDOMEN PELVIS WO CONTRAST  Result Date: 01/22/2021 CLINICAL DATA:  Right-sided abdominal pain. EXAM: CT ABDOMEN AND PELVIS WITHOUT CONTRAST TECHNIQUE: Multidetector CT imaging of the abdomen and pelvis was performed following the standard  protocol without IV contrast. COMPARISON:  January 19, 2021 FINDINGS: Lower chest: No acute abnormality. Hepatobiliary: No focal liver abnormality is seen. Status post cholecystectomy. The common bile duct measures 1.2 cm in diameter. Pancreas: Unremarkable. No pancreatic ductal dilatation or surrounding inflammatory changes. Spleen: Normal in size without focal abnormality. Adrenals/Urinary Tract: Adrenal glands are unremarkable. Kidneys are normal, without renal calculi, focal lesion, or hydronephrosis. The urinary bladder is poorly distended and subsequently limited in evaluation. Stomach/Bowel: Surgical sutures are seen within the gastric region. Surgically anastomosed bowel is also noted within the mid left abdomen. The appendix is not clearly identified. Stool is seen throughout the large bowel. Moderately inflamed small bowel loops are suspected within the pelvis. It should be noted that this is limited in evaluation in the absence of oral contrast. A short segment of dilated small bowel is also suspected within the lower pelvis on the left (axial CT image 67 through 74, CT series 2). Vascular/Lymphatic: Very mild aortic atherosclerosis. No enlarged abdominal or pelvic lymph nodes. Reproductive: Uterus is mildly enlarged and heterogeneous in appearance. A 2.1 cm diameter cyst is noted along the posterior aspect of the right adnexa. Other: No abdominal wall hernia or abnormality. No abdominopelvic ascites. Musculoskeletal: No acute or significant osseous findings. IMPRESSION: 1. Moderate severity enteritis involving multiple loops of distal small bowel with additional findings that may represent a subsequent distal small bowel obstruction. 2. Evidence of prior gastric bypass surgery. 3. Evidence of prior cholecystectomy. 4. 2.1 cm diameter right adnexal cyst, likely ovarian in origin. 5. Very mild aortic atherosclerosis. Aortic Atherosclerosis (ICD10-I70.0). Electronically Signed   By: Virgina Norfolk M.D.    On: 01/22/2021 01:44     PROCEDURES:  Critical Care performed: Yes, see critical care procedure note(s)   CRITICAL CARE Performed by: Cyril Mourning Akeria Hedstrom   Total critical care time: 40 minutes  Critical care time was exclusive of separately billable procedures and treating other patients.  Critical care was necessary to treat or prevent imminent or life-threatening deterioration.  Critical care was time spent personally by me  on the following activities: development of treatment plan with patient and/or surrogate as well as nursing, discussions with consultants, evaluation of patient's response to treatment, examination of patient, obtaining history from patient or surrogate, ordering and performing treatments and interventions, ordering and review of laboratory studies, ordering and review of radiographic studies, pulse oximetry and re-evaluation of patient's condition.   Procedures    IMPRESSION / MDM / ASSESSMENT AND PLAN / ED COURSE  I reviewed the triage vital signs and the nursing notes.    Patient here with intractable pain, C. difficile colitis, vomiting unable to tolerate medications or food at home.    DIFFERENTIAL DIAGNOSIS (includes but not limited to):   C. difficile colitis, perforation, bowel obstruction, cholecystitis, appendicitis, UTI, pyelonephritis, kidney stone, dehydration, electrolyte derangement, toxic megacolon   PLAN: Labs, urine, IV fluids, IV pain and nausea medicine.  Likely admission.   MEDICATIONS GIVEN IN ED: Medications  lactated ringers infusion (has no administration in time range)  metroNIDAZOLE (FLAGYL) IVPB 500 mg (500 mg Intravenous New Bag/Given 01/22/21 0140)  benzocaine (HURRICAINE) 20 % mouth spray (has no administration in time range)  LORazepam (ATIVAN) injection 1 mg (has no administration in time range)  lactated ringers bolus 1,000 mL (1,000 mLs Intravenous New Bag/Given 01/22/21 0139)  HYDROmorphone (DILAUDID) injection 1 mg (1 mg  Intravenous Given 01/22/21 0143)  ondansetron (ZOFRAN) injection 4 mg (4 mg Intravenous Given 01/22/21 0139)     ED COURSE: Patient's labs today show hypoglycemia likely due to GI losses and patient unable to tolerate p.o.  We will recheck her blood sugar.  Patient has had 3 CT scans in the past couple of weeks that have all shown C. difficile colitis.  She has a nonsurgical abdomen today.  I do not feel she needs another repeat CT scan.  Given she is not tolerating p.o., will give IV fluids, IV Dilaudid and Zofran.  Will give IV Flagyl since she is not tolerating oral vancomycin.  I feel patient will need admission to the hospital for fluids, pain control.   1:02 AM  Discussed case with hospitalist, Dr. Damita Dunnings.  She will see patient feels she may need a CT scan of her abdomen pelvis given pain is worsening to rule out toxic megacolon especially given she has had such prolonged symptoms and seems to be failing oral antibiotics.  Will obtain CT of abdomen pelvis for further evaluation.  Will reconsult hospitalist service once CT completed and read by radiology.   1:49 AM  Spoke with Dr. Sherrye Payor with radiology - has persistent small bowel inflammation and as a result is getting a early SBO.  No sign of toxic megacolon.  We will update patient and place NG tube.  Will discuss with hospitalist.  CONSULTS:  Consulted and discussed patient's case with hospitalist, Dr. Damita Dunnings.  I have recommended admission and consulting physician agrees and will place admission orders.  Patient (and family if present) agree with this plan.   I reviewed all nursing notes, vitals, pertinent previous records.  All labs, EKGs, imaging ordered have been independently reviewed and interpreted by myself.    OUTSIDE RECORDS REVIEWED: Reviewed records from recent hospitalization for C. difficile colitis.         FINAL CLINICAL IMPRESSION(S) / ED DIAGNOSES   Final diagnoses:  C. difficile colitis  Nausea vomiting  and diarrhea  Hypoglycemia  Uncontrolled pain  SBO (small bowel obstruction) ([place])     Rx / DC Orders   ED Discharge Orders  None        Note:  This document was prepared using Dragon voice recognition software and may include unintentional dictation errors.   Peggy Loge, Delice Bison, DO 01/22/21 0157

## 2021-01-22 NOTE — ED Notes (Signed)
Pt unable to sustain full hour at this time. Pt sustained for about 45 min. Assisted pt to restroom.

## 2021-01-22 NOTE — ED Notes (Signed)
Pt unable to tolerate second enema due to pain and urge to have bowel movement.

## 2021-01-22 NOTE — Consult Note (Signed)
Subjective:   CC: Small bowel obstruction  HPI:  Vanessa Romero is a 46 y.o. female who was consulted by Damita Dunnings for issue above.  Symptoms were first noted a few days ago. Pain is sharp, periumbilical.  Associated with N/V, exacerbated by nothing specific.  Currently has been treated for C. Diff,  pain I is similar since diagnosis but had acute worsening.  Last BM as this am, continued diarrhea with some blood.  The N/V has been a chronic issue secondary to chemo for her cervical CA     Past Medical History:  has a past medical history of Alcohol abuse, Anemia, Cancer ([place]), and Tobacco dependence.  Past Surgical History:  Past Surgical History:  Procedure Laterality Date   ABDOMINAL ADHESION SURGERY     bowel obstruction     x2   CERVICAL CONIZATION W/BX N/A 11/26/2020   Procedure: CONIZATION CERVIX WITH BIOPSY;  Surgeon: Malachy Mood, MD;  Location: [facility] ORS;  Service: Gynecology;  Laterality: N/A;   ESOPHAGOGASTRODUODENOSCOPY N/A 11/24/2020   Procedure: ESOPHAGOGASTRODUODENOSCOPY (EGD);  Surgeon: Lin Landsman, MD;  Location: [facility] ENDOSCOPY;  Service: Gastroenterology;  Laterality: N/A;   laparoscopic knee surgery     PORTA CATH INSERTION N/A 12/20/2020   Procedure: PORTA CATH INSERTION;  Surgeon: Algernon Huxley, MD;  Location: [place] [facility];  Service: Cardiovascular;  Laterality: N/A;   ROUX-EN-Y GASTRIC BYPASS     TEAR DUCT PROBING     unclogg   VAGOTOMY     VENTRICULOPERITONEAL SHUNT     x6 put in and removals    Family History: family history includes Cancer in her father, maternal grandmother, and mother.  Social History:  reports that she has quit smoking. Her smoking use included cigarettes. She has quit using smokeless tobacco. She reports that she does not currently use alcohol. She reports that she does not currently use drugs.  Current Medications:  Prior to Admission medications   Medication Sig Start Date End Date Taking? Authorizing Provider   ALPRAZolam Duanne Moron) 0.5 MG tablet Take 1 tablet (0.5 mg total) by mouth 2 (two) times daily as needed for up to 5 days for anxiety. 01/18/21 01/23/21  Borders, Kirt Boys, NP  benzonatate (TESSALON) 200 MG capsule Take 1 capsule (200 mg total) by mouth 3 (three) times daily as needed for cough. 01/15/21   Melynda Ripple, MD  cholecalciferol (VITAMIN D) 25 MCG tablet Take 1 tablet (1,000 Units total) by mouth daily. 11/27/20   Loletha Grayer, MD  cyanocobalamin 1000 MCG tablet Take 1 tablet (1,000 mcg total) by mouth daily. 01/04/21   Jacquelin Hawking, NP  dicyclomine (BENTYL) 10 MG capsule Take 1 capsule (10 mg total) by mouth 3 (three) times daily as needed (abd pain). 01/06/21   Nance Pear, MD  escitalopram (LEXAPRO) 20 MG tablet Take 1 tablet (20 mg total) by mouth daily at 12 noon. 11/27/20   Loletha Grayer, MD  feeding supplement (ENSURE ENLIVE / ENSURE PLUS) LIQD Take 237 mLs by mouth 3 (three) times daily between meals. 11/27/20   Loletha Grayer, MD  fentaNYL (DURAGESIC) 25 MCG/HR Place 1 patch onto the skin every 3 (three) days. 01/18/21   Borders, Kirt Boys, NP  fluticasone (FLONASE) 50 MCG/ACT nasal spray Place 2 sprays into both nostrils daily. 01/15/21   Melynda Ripple, MD  folic acid (FOLVITE) 1 MG tablet Take 1 tablet (1 mg total) by mouth daily. 01/04/21   Jacquelin Hawking, NP  HYDROcodone-acetaminophen (NORCO) 10-325 MG tablet Take 1  tablet by mouth every 6 (six) hours as needed for up to 5 days for moderate pain or severe pain. 01/18/21 01/23/21  Borders, Kirt Boys, NP  lamoTRIgine (LAMICTAL) 25 MG tablet Take 2 tablets (50 mg total) by mouth daily. 11/27/20   Loletha Grayer, MD  lidocaine-prilocaine (EMLA) cream Apply to affected area once 12/07/20   Lloyd Huger, MD  Multiple Vitamin (MULTIVITAMIN WITH MINERALS) TABS tablet Take 1 tablet by mouth daily. 11/27/20   Loletha Grayer, MD  naloxone [facility]) nasal spray 4 mg/0.1 mL SPRAY 1 SPRAY INTO ONE NOSTRIL AS  DIRECTED FOR OPIOID OVERDOSE (TURN PERSON ON SIDE AFTER DOSE. IF NO RESPONSE IN 2-3 MINUTES OR PERSON RESPONDS BUT RELAPSES, REPEAT USING A NEW SPRAY DEVICE AND SPRAY INTO THE OTHER NOSTRIL. CALL 911 AFTER USE.) * EMERGENCY USE ONLY * 01/18/21   Borders, Kirt Boys, NP  ondansetron (ZOFRAN) 8 MG tablet Take 1 tablet (8 mg total) by mouth 2 (two) times daily as needed for refractory nausea / vomiting. 12/07/20   Lloyd Huger, MD  oxyCODONE-acetaminophen (PERCOCET) 5-325 MG tablet Take 1 tablet by mouth every 4 (four) hours as needed for severe pain. 01/19/21 01/19/22  Blake Divine, MD  prochlorperazine (COMPAZINE) 10 MG tablet Take 1 tablet (10 mg total) by mouth every 6 (six) hours as needed (Nausea or vomiting). 12/07/20   Lloyd Huger, MD  sucralfate (CARAFATE) 1 g tablet Take 1 tablet (1 g total) by mouth 3 (three) times daily. 01/04/21   Noreene Filbert, MD  thiamine 100 MG tablet Take 1 tablet (100 mg total) by mouth daily. 11/27/20   Loletha Grayer, MD    Allergies:  Allergies as of 01/21/2021 - Review Complete 01/21/2021  Allergen Reaction Noted   Contrast media [iodinated contrast media] Hives 10/03/2012   Gabapentin Other (See Comments), Rash, and Palpitations 08/03/2014   Morphine Dermatitis, Hives, Rash, Swelling, and Other (See Comments) 10/03/2012   Sumatriptan Dermatitis, Hives, Itching, Other (See Comments), and Swelling 05/24/2011   Zolpidem Nausea And Vomiting and Other (See Comments) 10/03/2012   Erythromycin Diarrhea, Nausea And Vomiting, and Nausea Only 10/03/2012   Valproic acid Rash 05/24/2011   Acetazolamide  03/25/2013   Erythromycin base  09/02/2015   Amoxicillin Rash 05/24/2019   Divalproex sodium Anxiety and Other (See Comments) 12/01/2020    ROS:  General: Denies weight loss, weight gain, fatigue, fevers, chills, and night sweats. Eyes: Denies blurry vision, double vision, eye pain, itchy eyes, and tearing. Ears: Denies hearing loss, earache, and  ringing in ears. Nose: Denies sinus pain, congestion, infections, runny nose, and nosebleeds. Mouth/throat: Denies hoarseness, sore throat, bleeding gums, and difficulty swallowing. Heart: Denies chest pain, palpitations, racing heart, irregular heartbeat, leg pain or swelling, and decreased activity tolerance. Respiratory: Denies breathing difficulty, shortness of breath, wheezing, cough, and sputum. GI: Denies change in appetite, heartburn, constipation,  GU: Denies difficulty urinating, pain with urinating, urgency, frequency, blood in urine. Musculoskeletal: Denies joint stiffness, pain, swelling, muscle weakness. Skin: Denies rash, itching, mass, tumors, sores, and boils Neurologic: Denies headache, fainting, dizziness, seizures, numbness, and tingling. Psychiatric: Denies depression, anxiety, difficulty sleeping, and memory loss. Endocrine: Denies heat or cold intolerance, and increased thirst or urination. Blood/lymph: Denies easy bruising, easy bruising, and swollen glands     Objective:     BP (!) 131/95    Pulse 78    Temp 97.7 F (36.5 C)    Resp 13    Ht 5\' 7"  (1.702 m)    Wt 54.4  kg    LMP 01/04/2021    SpO2 99%    BMI 18.78 kg/m   Constitutional :  alert, cooperative, appears stated age, and mild distress  Lymphatics/Throat:  no asymmetry, masses, or scars  Respiratory:  clear to auscultation bilaterally  Cardiovascular:  regular rate and rhythm  Gastrointestinal: Soft, no guarding, moderate tenderness to palpation in the periumbilical region, nondistended .   Musculoskeletal: Steady movement  Skin: Cool and moist, mult surgical scars   Psychiatric: Normal affect, non-agitated, not confused       LABS:  CMP Latest Ref Rng & Units 01/22/2021 01/21/2021 01/19/2021  Glucose 70 - 99 mg/dL 85 54(L) 144(H)  BUN 6 - 20 mg/dL 12 18 14   Creatinine 0.44 - 1.00 mg/dL 0.57 0.68 0.85  Sodium 135 - 145 mmol/L 135 139 133(L)  Potassium 3.5 - 5.1 mmol/L 4.5 4.1 3.5  Chloride 98 - 111  mmol/L 104 106 103  CO2 22 - 32 mmol/L 24 27 22   Calcium 8.9 - 10.3 mg/dL 8.5(L) 8.7(L) 8.4(L)  Total Protein 6.5 - 8.1 g/dL - 6.6 6.2(L)  Total Bilirubin 0.3 - 1.2 mg/dL - 0.4 0.4  Alkaline Phos 38 - 126 U/L - 53 53  AST 15 - 41 U/L - 26 27  ALT 0 - 44 U/L - 19 21   CBC Latest Ref Rng & Units 01/22/2021 01/21/2021 01/19/2021  WBC 4.0 - 10.5 K/uL 3.0(L) 4.8 3.1(L)  Hemoglobin 12.0 - 15.0 g/dL 9.9(L) 11.5(L) 10.9(L)  Hematocrit 36.0 - 46.0 % 31.1(L) 37.4 33.9(L)  Platelets 150 - 400 K/uL 202 250 234    RADS: CLINICAL DATA:  Right-sided abdominal pain.   EXAM: CT ABDOMEN AND PELVIS WITHOUT CONTRAST   TECHNIQUE: Multidetector CT imaging of the abdomen and pelvis was performed following the standard protocol without IV contrast.   COMPARISON:  January 19, 2021   FINDINGS: Lower chest: No acute abnormality.   Hepatobiliary: No focal liver abnormality is seen. Status post cholecystectomy. The common bile duct measures 1.2 cm in diameter.   Pancreas: Unremarkable. No pancreatic ductal dilatation or surrounding inflammatory changes.   Spleen: Normal in size without focal abnormality.   Adrenals/Urinary Tract: Adrenal glands are unremarkable. Kidneys are normal, without renal calculi, focal lesion, or hydronephrosis. The urinary bladder is poorly distended and subsequently limited in evaluation.   Stomach/Bowel: Surgical sutures are seen within the gastric region. Surgically anastomosed bowel is also noted within the mid left abdomen. The appendix is not clearly identified. Stool is seen throughout the large bowel. Moderately inflamed small bowel loops are suspected within the pelvis. It should be noted that this is limited in evaluation in the absence of oral contrast. A short segment of dilated small bowel is also suspected within the lower pelvis on the left (axial CT image 67 through 74, CT series 2).   Vascular/Lymphatic: Very mild aortic atherosclerosis. No  enlarged abdominal or pelvic lymph nodes.   Reproductive: Uterus is mildly enlarged and heterogeneous in appearance. A 2.1 cm diameter cyst is noted along the posterior aspect of the right adnexa.   Other: No abdominal wall hernia or abnormality. No abdominopelvic ascites.   Musculoskeletal: No acute or significant osseous findings.   IMPRESSION: 1. Moderate severity enteritis involving multiple loops of distal small bowel with additional findings that may represent a subsequent distal small bowel obstruction. 2. Evidence of prior gastric bypass surgery. 3. Evidence of prior cholecystectomy. 4. 2.1 cm diameter right adnexal cyst, likely ovarian in origin. 5. Very mild aortic  atherosclerosis.   Aortic Atherosclerosis (ICD10-I70.0).     Electronically Signed   By: Virgina Norfolk M.D.   On: 01/22/2021 01:44   Assessment:   Small bowel obstruction concerns on CT, images reviewed by myself.  Plan:   Despite the CT read above, patient is still actively having diffuse diarrhea, and chronic nausea vomiting that has been going on prior to all this secondary to her chemotherapy.  On physical exam she is nondistended.  Review of the images is suboptimal secondary to lack of contrast use as well.  History and physical exam at this point is more consistent with worsening/persistent C. difficile infection as the cause of her abdominal pain rather than an obstructive process.  Recommend full medical supportive care per the primary team at this point.  As long as the patient continues to have bowel movements, no surgical intervention is needed from a concern for a bowel obstruction.  Surgery will peripherally follow for now.  Please call with any questions

## 2021-01-22 NOTE — ED Notes (Addendum)
Dr. Damita Dunnings notified of not being able to pass NG past patients nose due resistance. Per Dr. Damita Dunnings if patient is not vomitting NG placement can be held.

## 2021-01-22 NOTE — ED Notes (Addendum)
Messaged MD regarding no improvement in lower right abdominal pain despite the Dilaudid administered. Informed MD patient requesting further additional pain medication.

## 2021-01-22 NOTE — ED Notes (Signed)
This RN at bedside. Pt states that the pain medication is not helping with her pain at all, states that the Diluadid was helping more. Also requesting medication for anxiety states she usually take Xanax twice a day. Dr. Earleen Newport notified.

## 2021-01-22 NOTE — ED Notes (Signed)
Pt to CT

## 2021-01-22 NOTE — ED Notes (Addendum)
Pt's retention enema set up as follow. 18 Fr foley cath with 30 cc balloon inserted in the rectum and balloon inflated. Vancomycin instilled via cath and clamped afterwards. Pt side lying and instructed to retain for 1 hour. Will reevaluate in 1 hour.

## 2021-01-22 NOTE — ED Notes (Signed)
Pt requesting pain medication again. States that the medication is not helping at all.

## 2021-01-23 ENCOUNTER — Encounter: Payer: Self-pay | Admitting: Internal Medicine

## 2021-01-23 DIAGNOSIS — R1084 Generalized abdominal pain: Secondary | ICD-10-CM | POA: Diagnosis not present

## 2021-01-23 DIAGNOSIS — A0472 Enterocolitis due to Clostridium difficile, not specified as recurrent: Secondary | ICD-10-CM | POA: Diagnosis not present

## 2021-01-23 DIAGNOSIS — U071 COVID-19: Secondary | ICD-10-CM | POA: Diagnosis not present

## 2021-01-23 DIAGNOSIS — R112 Nausea with vomiting, unspecified: Secondary | ICD-10-CM | POA: Diagnosis not present

## 2021-01-23 LAB — CBC
HCT: 30 % — ABNORMAL LOW (ref 36.0–46.0)
Hemoglobin: 9.7 g/dL — ABNORMAL LOW (ref 12.0–15.0)
MCH: 26.7 pg (ref 26.0–34.0)
MCHC: 32.3 g/dL (ref 30.0–36.0)
MCV: 82.6 fL (ref 80.0–100.0)
Platelets: 198 10*3/uL (ref 150–400)
RBC: 3.63 MIL/uL — ABNORMAL LOW (ref 3.87–5.11)
WBC: 3 10*3/uL — ABNORMAL LOW (ref 4.0–10.5)
nRBC: 0 % (ref 0.0–0.2)

## 2021-01-23 LAB — COMPREHENSIVE METABOLIC PANEL
ALT: 17 U/L (ref 0–44)
AST: 22 U/L (ref 15–41)
Albumin: 2.8 g/dL — ABNORMAL LOW (ref 3.5–5.0)
Alkaline Phosphatase: 40 U/L (ref 38–126)
Anion gap: 8 (ref 5–15)
BUN: 9 mg/dL (ref 6–20)
CO2: 25 mmol/L (ref 22–32)
Calcium: 8.3 mg/dL — ABNORMAL LOW (ref 8.9–10.3)
Chloride: 103 mmol/L (ref 98–111)
Creatinine, Ser: 0.52 mg/dL (ref 0.44–1.00)
GFR, Estimated: >60 mL/min (ref >60)
Glucose, Bld: 81 mg/dL (ref 70–99)
Potassium: 3.6 mmol/L (ref 3.5–5.1)
Sodium: 136 mmol/L (ref 135–145)
Total Bilirubin: 0.5 mg/dL (ref 0.3–1.2)
Total Protein: 5.3 g/dL — ABNORMAL LOW (ref 6.5–8.1)

## 2021-01-23 MED ORDER — METOCLOPRAMIDE HCL 5 MG/ML IJ SOLN
5.0000 mg | Freq: Four times a day (QID) | INTRAMUSCULAR | Status: DC
  Administered 2021-01-23 – 2021-01-24 (×5): 5 mg via INTRAVENOUS
  Filled 2021-01-23 (×5): qty 2

## 2021-01-23 MED ORDER — FAMOTIDINE IN NACL 20-0.9 MG/50ML-% IV SOLN
20.0000 mg | Freq: Every day | INTRAVENOUS | Status: DC
  Administered 2021-01-23 – 2021-01-24 (×2): 20 mg via INTRAVENOUS
  Filled 2021-01-23 (×3): qty 50

## 2021-01-23 MED ORDER — FIDAXOMICIN 200 MG PO TABS
200.0000 mg | ORAL_TABLET | Freq: Two times a day (BID) | ORAL | Status: DC
  Administered 2021-01-23 – 2021-01-25 (×5): 200 mg via ORAL
  Filled 2021-01-23 (×6): qty 1

## 2021-01-23 MED ORDER — CHLORHEXIDINE GLUCONATE CLOTH 2 % EX PADS
6.0000 | MEDICATED_PAD | Freq: Every day | CUTANEOUS | Status: DC
  Administered 2021-01-23 – 2021-01-25 (×3): 6 via TOPICAL

## 2021-01-23 NOTE — ED Notes (Signed)
Breakfast tray given. °

## 2021-01-23 NOTE — Progress Notes (Signed)
Patient ID: Vanessa Romero, female   DOB: Aug 11, 1975, 46 y.o.   MRN: [id number] [facility] PROGRESS NOTE  Antoine Fiallos TKW:[id number] DOB: 1975/02/09 DOA: 01/22/2021 PCP: Anselmo Pickler, MD  HPI/Subjective: Patient feeling a little bit better.  Was able to tolerate liquids.  Able to tolerate the oral Dificid.  Not having diarrhea currently but did yesterday.  Still having quite a bit of abdominal pain.  Just dry heaving at this time no vomiting.  Admitted with recurrent C. difficile.  Objective: Vitals:   01/23/21 0807 01/23/21 1052  BP: (!) 114/96 112/73  Pulse: 90 65  Resp: 20 20  Temp:  97.8 F (36.6 C)  SpO2: 100% 100%    Intake/Output Summary (Last 24 hours) at 01/23/2021 1307 Last data filed at 01/23/2021 0804 Gross per 24 hour  Intake 399.49 ml  Output --  Net 399.49 ml   Filed Weights   01/21/21 1930 01/23/21 1115  Weight: 54.4 kg 54.8 kg    ROS: Review of Systems  Respiratory:  Negative for shortness of breath.   Cardiovascular:  Negative for chest pain.  Gastrointestinal:  Positive for abdominal pain and nausea. Negative for vomiting.  Exam: Physical Exam HENT:     Head: Normocephalic.     Mouth/Throat:     Pharynx: No oropharyngeal exudate.  Eyes:     General: Lids are normal.     Conjunctiva/sclera: Conjunctivae normal.  Cardiovascular:     Rate and Rhythm: Normal rate and regular rhythm.     Heart sounds: Normal heart sounds, S1 normal and S2 normal.  Pulmonary:     Breath sounds: No decreased breath sounds, wheezing, rhonchi or rales.  Abdominal:     Palpations: Abdomen is soft.     Tenderness: There is generalized abdominal tenderness.  Skin:    General: Skin is warm.     Findings: No rash.  Neurological:     Mental Status: She is alert and oriented to person, place, and time.      Scheduled Meds:  ALPRAZolam  0.5 mg Oral BID   Chlorhexidine Gluconate Cloth  6 each Topical Daily   enoxaparin (LOVENOX) injection  40 mg  Subcutaneous Daily   escitalopram  10 mg Oral Daily   fentaNYL  1 patch Transdermal Q72H   fidaxomicin  200 mg Oral BID   lamoTRIgine  50 mg Oral Daily   metoCLOPramide (REGLAN) injection  5 mg Intravenous Q6H   thiamine injection  100 mg Intravenous Daily   Continuous Infusions:  famotidine (PEPCID) IV     lactated ringers 50 mL/hr at 01/23/21 1041   remdesivir 100 mg in NS 100 mL 100 mg (01/23/21 1102)    Assessment/Plan:  Recurrent C. difficile colitis.  Continues to have severe abdominal pain.  Nausea and dry heaves.  Switched rectal vancomycin and IV Flagyl over to p.o. Dificid this morning.  Advanced from clear liquid diet up to full liquid diet for this afternoon.  As needed IV Dilaudid and p.o. pain medications.  Added Reglan for nausea vomiting and changed Protonix to Pepcid. COVID-19 virus infection possible gastroenteritis.  Continue remdesivir Cervical cancer stage IIb.  Receiving chemo and radiation therapy as outpatient. Chronic pain on Duragesic patch Seizure disorder on Lamictal Anxiety depression on Xanax and Lexapro Prior history of alcohol abuse Vitamin B12 deficiency Vitamin D deficiency      Code Status:     Code Status Orders  (From admission, onward)  Start     Ordered   01/22/21 0253  Full code  Continuous        01/22/21 0254           Code Status History     Date Active Date Inactive Code Status Order ID Comments User Context   01/07/2021 2147 01/14/2021 1947 Full Code [id number]  Bernadette Hoit, DO ED   11/22/2020 1306 11/27/2020 1931 Full Code [id number]  Cox, Briant Cedar, DO ED      Family Communication: Updated patient's mother on the phone Disposition Plan: Status is: Inpatient  Antibiotics: Change antibiotics to p.o. Stem  [facility] [employer]

## 2021-01-24 ENCOUNTER — Other Ambulatory Visit: Payer: Self-pay

## 2021-01-24 ENCOUNTER — Ambulatory Visit: Payer: Medicare Other

## 2021-01-24 ENCOUNTER — Encounter: Payer: Self-pay | Admitting: Oncology

## 2021-01-24 ENCOUNTER — Other Ambulatory Visit (HOSPITAL_COMMUNITY): Payer: Self-pay

## 2021-01-24 DIAGNOSIS — G40909 Epilepsy, unspecified, not intractable, without status epilepticus: Secondary | ICD-10-CM

## 2021-01-24 DIAGNOSIS — R112 Nausea with vomiting, unspecified: Secondary | ICD-10-CM | POA: Diagnosis not present

## 2021-01-24 DIAGNOSIS — K56609 Unspecified intestinal obstruction, unspecified as to partial versus complete obstruction: Secondary | ICD-10-CM | POA: Diagnosis not present

## 2021-01-24 DIAGNOSIS — C539 Malignant neoplasm of cervix uteri, unspecified: Secondary | ICD-10-CM | POA: Diagnosis not present

## 2021-01-24 DIAGNOSIS — U071 COVID-19: Secondary | ICD-10-CM | POA: Diagnosis not present

## 2021-01-24 DIAGNOSIS — A0472 Enterocolitis due to Clostridium difficile, not specified as recurrent: Secondary | ICD-10-CM | POA: Diagnosis not present

## 2021-01-24 MED ORDER — ESCITALOPRAM OXALATE 20 MG PO TABS
20.0000 mg | ORAL_TABLET | Freq: Every day | ORAL | Status: DC
  Administered 2021-01-25: 20 mg via ORAL
  Filled 2021-01-24: qty 1

## 2021-01-24 MED ORDER — METOCLOPRAMIDE HCL 5 MG/ML IJ SOLN
5.0000 mg | Freq: Four times a day (QID) | INTRAMUSCULAR | Status: DC | PRN
  Administered 2021-01-24 – 2021-01-25 (×3): 5 mg via INTRAVENOUS
  Filled 2021-01-24 (×3): qty 2

## 2021-01-24 MED ORDER — ADULT MULTIVITAMIN W/MINERALS CH: 1.0000 | ORAL_TABLET | Freq: Every day | ORAL | Status: DC

## 2021-01-24 MED ORDER — SODIUM CHLORIDE 0.9 % IV SOLN: INTRAVENOUS | Status: DC | PRN

## 2021-01-24 MED ORDER — DIFICID 200 MG PO TABS
200.0000 mg | ORAL_TABLET | Freq: Two times a day (BID) | ORAL | 0 refills | Status: DC
Start: 2021-01-24 — End: 2021-02-02
  Filled 2021-01-24: qty 15, 8d supply, fill #0

## 2021-01-24 MED ORDER — KATE FARMS STANDARD 1.4 PO LIQD
325.0000 mL | Freq: Three times a day (TID) | ORAL | Status: DC
  Administered 2021-01-24 – 2021-01-25 (×2): 325 mL via ORAL
  Filled 2021-01-24: qty 325

## 2021-01-24 NOTE — TOC Progression Note (Signed)
Transition of Care [facility]) - Progression Note    Patient Details  Name: Vanessa Romero MRN: [id number] Date of Birth: 1976-01-15  Transition of Care [facility]) CM/SW Walker, RN Phone Number: 01/24/2021, 8:56 AM  Clinical Narrative:    Transition of Care (TOC) Screening Note   Patient Details  Name: Vanessa Romero Date of Birth: 11/28/1975   Transition of Care [facility]) CM/SW Contact:    Conception Oms, RN Phone Number: 01/24/2021, 8:57 AM    Transition of Care Department [facility]) has reviewed patient and no TOC needs have been identified at this time. We will continue to monitor patient advancement through interdisciplinary progression rounds. If new patient transition needs arise, please place a TOC consult.           Expected Discharge Plan and Services                                                 Social Determinants of Health (SDOH) Interventions    Readmission Risk Interventions No flowsheet data found.

## 2021-01-24 NOTE — Progress Notes (Signed)
Initial Nutrition Assessment  DOCUMENTATION CODES:  Not applicable  INTERVENTION:  Continue regular diet.  Add [employer] Standard 1.0 (chocolate)- provide supplement three times daily po- provides 325kcal and 16g protein per serving.   Add MVI with minerals daily.  Encourage PO and supplement intake.  NUTRITION DIAGNOSIS:  Inadequate oral intake related to nausea, vomiting, diarrhea as evidenced by per patient/family report.  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 difficile colitis of the descending colon. Admitted with COVID-19 and c. difficile colitis.  RD working remotely. Attempted to call patient's room phone. Pt did not answer.  Pt recently seen by another RD - pt drinks chocolate [employer] at home. This RD to reorder that.  Pt moderately malnourished during previous hospital stay. This is likely still the case, however RD cannot definitively diagnose at this time.  Weight stable per Epic.  Medications: reviewed; Dificid BID, thiamine, Pepcid per IV, Dilaudid per IV (given 4 times today), Reglan per IV PRN (given once today), Percocet PO PRN (given twice today), Compazine per IV PRN (given once today)  Labs: reviewed  NUTRITION - FOCUSED PHYSICAL EXAM: Unable to perform - defer to follow-up  Diet Order:   Diet Order             Diet regular Room service appropriate? Yes; Fluid consistency: Thin  Diet effective now                  EDUCATION NEEDS:  Education needs have been addressed  Skin:  Skin Assessment: Reviewed RN  Assessment  Last BM:  01/24/21 - Type 6, brown/black, small  Height:  Ht Readings from Last 1 Encounters:  01/23/21 5\' 7"  (1.702 m)   Weight:  Wt Readings from Last 1 Encounters:  01/23/21 54.8 kg   BMI:  Body mass index is 18.92 kg/m.  Estimated Nutritional Needs:  Kcal:  1900-2100 Protein:  80-95 grams Fluid:  >1.9 L  Derrel Nip, RD, LDN (she/her/hers) Clinical Inpatient Dietitian RD Pager/After-Hours/Weekend Pager # in [place]

## 2021-01-24 NOTE — TOC Benefit Eligibility Note (Signed)
Patient Teacher, English as a foreign language completed.    The patient is currently admitted and upon discharge could be taking Dificid 200 mg tablets.  The current 10 day co-pay is, $1,641.53.   The patient is insured through [place], [place] Patient Advocate Specialist [place] Patient Advocate Team Direct Number: [phone]  Fax: [phone]

## 2021-01-24 NOTE — Plan of Care (Signed)
°  Problem: Education: Goal: Knowledge of General Education information will improve Description: Including pain rating scale, medication(s)/side effects and non-pharmacologic comfort measures Outcome: Progressing   Problem: Health Behavior/Discharge Planning: Goal: Ability to manage health-related needs will improve Outcome: Progressing   Problem: Clinical Measurements: Goal: Ability to maintain clinical measurements within normal limits will improve Outcome: Progressing Goal: Will remain free from infection Outcome: Progressing Goal: Diagnostic test results will improve Outcome: Progressing Goal: Respiratory complications will improve Outcome: Progressing Goal: Cardiovascular complication will be avoided Outcome: Progressing   Problem: Activity: Goal: Risk for activity intolerance will decrease Outcome: Progressing   Problem: Nutrition: Goal: Adequate nutrition will be maintained Outcome: Progressing   Problem: Coping: Goal: Level of anxiety will decrease Outcome: Progressing   Problem: Elimination: Goal: Will not experience complications related to bowel motility Outcome: Progressing Goal: Will not experience complications related to urinary retention Outcome: Progressing   Problem: Pain Managment: Goal: General experience of comfort will improve Outcome: Progressing   Problem: Safety: Goal: Ability to remain free from injury will improve Outcome: Progressing   Problem: Skin Integrity: Goal: Risk for impaired skin integrity will decrease Outcome: Progressing   Problem: Education: Goal: Knowledge of risk factors and measures for prevention of condition will improve Outcome: Progressing   Problem: Coping: Goal: Psychosocial and spiritual needs will be supported Outcome: Progressing   Problem: Respiratory: Goal: Will maintain a patent airway Outcome: Progressing Goal: Complications related to the disease process, condition or treatment will be avoided or  minimized Outcome: Progressing   Problem: Education: Goal: Knowledge of risk factors and measures for prevention of condition will improve Outcome: Progressing   Problem: Coping: Goal: Psychosocial and spiritual needs will be supported Outcome: Progressing   Problem: Respiratory: Goal: Will maintain a patent airway Outcome: Progressing Goal: Complications related to the disease process, condition or treatment will be avoided or minimized Outcome: Progressing

## 2021-01-24 NOTE — TOC Benefit Eligibility Note (Signed)
Patient Advocate Encounter  Completed and sent [employer] application for Dificid for this patient.    Patient is approved 01/24/2021 through 02/23/2021.  BIN      [id number] PCN    AS [id number]    [id number] ID        [id number]     Lyndel Safe, CPhT Pharmacy Patient Advocate Specialist [place] Patient Advocate Team Direct Number: [phone]  Fax: [phone]

## 2021-01-24 NOTE — Consult Note (Signed)
[place]  [phone] [phone]  ID: Vanessa Romero OB: 1975-03-03  MR#: [id number]  ENI#:[id number]  Patient Care Team: Anselmo Pickler, MD as PCP - General (Internal Medicine)  CHIEF COMPLAINT: Stage IIb adenocarcinoma of the cervix, now with abdominal pain and recurrent C. difficile colitis.  INTERVAL HISTORY: Patient is a 46 year old female who is actively receiving treatment with chemotherapy and XRT for adenocarcinoma of the cervix, although has not had treatment in greater than 2 weeks.  She was recently readmitted to the hospital with worsening abdominal pain and concern for recurrent C. difficile colitis.  She feels significantly improved since admission, but not back to her baseline.  She continues to have mild abdominal pain, but her diarrhea has improved.  She has no neurologic complaints.  She denies any recent fevers.  She has a fair appetite.  She has no chest pain, shortness of breath, cough, or hemoptysis.  She continues to have nausea.  She has no urinary complaints.  Patient offers no further specific complaints today.  REVIEW OF SYSTEMS:   Review of Systems  Constitutional:  Positive for malaise/fatigue. Negative for fever and weight loss.  Respiratory: Negative.  Negative for cough, hemoptysis and shortness of breath.   Cardiovascular: Negative.  Negative for chest pain and leg swelling.  Gastrointestinal:  Positive for abdominal pain, diarrhea and nausea.  Genitourinary:  Negative for dysuria.  Musculoskeletal: Negative.   Skin: Negative.  Negative for itching and rash.  Neurological:  Positive for weakness. Negative for dizziness, focal weakness and headaches.  Psychiatric/Behavioral: Negative.  The patient is not nervous/anxious.    As per HPI. Otherwise, a complete review of systems is negative.  PAST MEDICAL HISTORY: Past Medical History:  Diagnosis Date   Alcohol abuse    Anemia    Cancer (Secretary)    Tobacco  dependence     PAST SURGICAL HISTORY: Past Surgical History:  Procedure Laterality Date   ABDOMINAL ADHESION SURGERY     bowel obstruction     x2   CERVICAL CONIZATION W/BX N/A 11/26/2020   Procedure: CONIZATION CERVIX WITH BIOPSY;  Surgeon: Malachy Mood, MD;  Location: [facility] ORS;  Service: Gynecology;  Laterality: N/A;   ESOPHAGOGASTRODUODENOSCOPY N/A 11/24/2020   Procedure: ESOPHAGOGASTRODUODENOSCOPY (EGD);  Surgeon: Lin Landsman, MD;  Location: [facility] ENDOSCOPY;  Service: Gastroenterology;  Laterality: N/A;   laparoscopic knee surgery     PORTA CATH INSERTION N/A 12/20/2020   Procedure: PORTA CATH INSERTION;  Surgeon: Algernon Huxley, MD;  Location: [facility];  Service: Cardiovascular;  Laterality: N/A;   ROUX-EN-Y GASTRIC BYPASS     TEAR DUCT PROBING     unclogg   VAGOTOMY     VENTRICULOPERITONEAL SHUNT     x6 put in and removals    FAMILY HISTORY: Family History  Problem Relation Age of Onset   Cancer Mother    Cancer Father    Cancer Maternal Grandmother     ADVANCED DIRECTIVES (Y/N):  @ADVDIR @  HEALTH MAINTENANCE: Social History   Tobacco Use   Smoking status: Former    Types: Cigarettes   Smokeless tobacco: Former  Scientific laboratory technician Use: Former  Substance Use Topics   Alcohol use: Not Currently   Drug use: Not Currently     Colonoscopy:  PAP:  Bone density:  Lipid panel:  Allergies  Allergen Reactions   Contrast Media [Iodinated Contrast Media] Hives   Gabapentin Other (See Comments), Rash and Palpitations    Other  Reaction: tachycardia Other Reaction: tachycardia    Morphine Dermatitis, Hives, Rash, Swelling and Other (See Comments)    Other reaction(s): Unknown (comments) Has tolerated hydromorphone (Dilaudid) Immediate after injections arm edema and arm turned bright red Immediate after injections arm edema and arm turned bright red IV Morphine IV Morphine    Sumatriptan Dermatitis, Hives, Itching, Other (See Comments)  and Swelling    Other reaction(s): Joint Pain, Other (See Comments), Other (see comments), Unknown (comments) lock jaw Lock jaw Lock jaw Lock jaw TIGHTENING OF JAW Lock jaw lock jaw Lock jaw TIGHTENING OF JAW Lock jaw    Zolpidem Nausea And Vomiting and Other (See Comments)    Other reaction(s): Other (see comments) sleep walking sleep walking Sleep walking  don't tolerate it well    Erythromycin Diarrhea, Nausea And Vomiting and Nausea Only    Extreme upset stomach    Valproic Acid Rash    Other reaction(s): Other (see comments), Unknown MOOD DISORDER MOOD DISORDER Depakote: Reaction unknown     Acetazolamide     Other reaction(s): Unknown (comments)   Erythromycin Base     Other reaction(s): UNKNOWN   Amoxicillin Rash   Divalproex Sodium Anxiety and Other (See Comments)    Current Facility-Administered Medications  Medication Dose Route Frequency Provider Last Rate Last Admin   acetaminophen (TYLENOL) tablet 650 mg  650 mg Oral Q6H PRN Wieting, Richard, MD       ALPRAZolam Duanne Moron) tablet 0.5 mg  0.5 mg Oral BID Loletha Grayer, MD   0.5 mg at 01/24/21 1027   Chlorhexidine Gluconate Cloth 2 % PADS 6 each  6 each Topical Daily Loletha Grayer, MD   6 each at 01/24/21 1027   enoxaparin (LOVENOX) injection 40 mg  40 mg Subcutaneous Daily Judd Gaudier V, MD   40 mg at 01/24/21 1027   escitalopram (LEXAPRO) tablet 10 mg  10 mg Oral Daily Loletha Grayer, MD   10 mg at 01/24/21 1117   famotidine (PEPCID) IVPB 20 mg premix  20 mg Intravenous QHS Loletha Grayer, MD   Stopped at 01/23/21 2157   fentaNYL (DURAGESIC) 25 MCG/HR 1 patch  1 patch Transdermal Q72H Loletha Grayer, MD   1 patch at 01/22/21 0932   fidaxomicin (DIFICID) tablet 200 mg  200 mg Oral BID Loletha Grayer, MD   200 mg at 01/24/21 1027   HYDROmorphone (DILAUDID) injection 1 mg  1 mg Intravenous Q3H PRN Loletha Grayer, MD   1 mg at 01/24/21 1116   lamoTRIgine (LAMICTAL) tablet 50 mg  50 mg Oral  Daily Loletha Grayer, MD   50 mg at 01/24/21 1027   metoCLOPramide (REGLAN) injection 5 mg  5 mg Intravenous Q6H PRN Loletha Grayer, MD   5 mg at 01/24/21 1116   oxyCODONE-acetaminophen (PERCOCET/ROXICET) 5-325 MG per tablet 1 tablet  1 tablet Oral Q6H PRN Loletha Grayer, MD   1 tablet at 01/24/21 1027   prochlorperazine (COMPAZINE) injection 10 mg  10 mg Intravenous Q6H PRN Athena Masse, MD   10 mg at 01/24/21 0446   remdesivir 100 mg in sodium chloride 0.9 % 100 mL IVPB  100 mg Intravenous Daily Oswald Hillock, RPH   Stopped at 01/24/21 1105   thiamine (B-1) injection 100 mg  100 mg Intravenous Daily Loletha Grayer, MD   100 mg at 01/24/21 1027   Facility-Administered Medications Ordered in Other Encounters  Medication Dose Route Frequency Provider Last Rate Last Admin   heparin lock flush 100 UNIT/ML injection  OBJECTIVE: Vitals:   01/24/21 0606 01/24/21 1036  BP: 116/84 117/87  Pulse: 65 73  Resp: 16 16  Temp:  98.2 F (36.8 C)  SpO2: 98% 98%     Body mass index is 18.92 kg/m.    ECOG FS:1 - Symptomatic but completely ambulatory  General: Well-developed, well-nourished, no acute distress. Eyes: Pink conjunctiva, anicteric sclera. HEENT: Normocephalic, moist mucous membranes. Lungs: No audible wheezing or coughing. Heart: Regular rate and rhythm. Abdomen: Soft, nontender, no obvious distention. Musculoskeletal: No edema, cyanosis, or clubbing. Neuro: Alert, answering all questions appropriately. Cranial nerves grossly intact. Skin: No rashes or petechiae noted. Psych: Normal affect. Lymphatics: No cervical, calvicular, axillary or inguinal LAD.   LAB RESULTS:  Lab Results  Component Value Date   NA 136 01/23/2021   K 3.6 01/23/2021   CL 103 01/23/2021   CO2 25 01/23/2021   GLUCOSE 81 01/23/2021   BUN 9 01/23/2021   CREATININE 0.52 01/23/2021   CALCIUM 8.3 (L) 01/23/2021   PROT 5.3 (L) 01/23/2021   ALBUMIN 2.8 (L) 01/23/2021   AST 22  01/23/2021   ALT 17 01/23/2021   ALKPHOS 40 01/23/2021   BILITOT 0.5 01/23/2021   GFRNONAA >60 01/23/2021    Lab Results  Component Value Date   WBC 3.0 (L) 01/23/2021   NEUTROABS 1.6 (L) 01/19/2021   HGB 9.7 (L) 01/23/2021   HCT 30.0 (L) 01/23/2021   MCV 82.6 01/23/2021   PLT 198 01/23/2021     STUDIES: CT ABDOMEN PELVIS WO CONTRAST  Result Date: 01/22/2021 CLINICAL DATA:  Right-sided abdominal pain. EXAM: CT ABDOMEN AND PELVIS WITHOUT CONTRAST TECHNIQUE: Multidetector CT imaging of the abdomen and pelvis was performed following the standard protocol without IV contrast. COMPARISON:  January 19, 2021 FINDINGS: Lower chest: No acute abnormality. Hepatobiliary: No focal liver abnormality is seen. Status post cholecystectomy. The common bile duct measures 1.2 cm in diameter. Pancreas: Unremarkable. No pancreatic ductal dilatation or surrounding inflammatory changes. Spleen: Normal in size without focal abnormality. Adrenals/Urinary Tract: Adrenal glands are unremarkable. Kidneys are normal, without renal calculi, focal lesion, or hydronephrosis. The urinary bladder is poorly distended and subsequently limited in evaluation. Stomach/Bowel: Surgical sutures are seen within the gastric region. Surgically anastomosed bowel is also noted within the mid left abdomen. The appendix is not clearly identified. Stool is seen throughout the large bowel. Moderately inflamed small bowel loops are suspected within the pelvis. It should be noted that this is limited in evaluation in the absence of oral contrast. A short segment of dilated small bowel is also suspected within the lower pelvis on the left (axial CT image 67 through 74, CT series 2). Vascular/Lymphatic: Very mild aortic atherosclerosis. No enlarged abdominal or pelvic lymph nodes. Reproductive: Uterus is mildly enlarged and heterogeneous in appearance. A 2.1 cm diameter cyst is noted along the posterior aspect of the right adnexa. Other: No abdominal  wall hernia or abnormality. No abdominopelvic ascites. Musculoskeletal: No acute or significant osseous findings. IMPRESSION: 1. Moderate severity enteritis involving multiple loops of distal small bowel with additional findings that may represent a subsequent distal small bowel obstruction. 2. Evidence of prior gastric bypass surgery. 3. Evidence of prior cholecystectomy. 4. 2.1 cm diameter right adnexal cyst, likely ovarian in origin. 5. Very mild aortic atherosclerosis. Aortic Atherosclerosis (ICD10-I70.0). Electronically Signed   By: Virgina Norfolk M.D.   On: 01/22/2021 01:44   CT Abdomen Pelvis Wo Contrast  Result Date: 01/19/2021 CLINICAL DATA:  Abdominal pain. Diarrhea, nausea, and vomiting. Current treatment  for Clostridium difficile colitis. EXAM: CT ABDOMEN AND PELVIS WITHOUT CONTRAST TECHNIQUE: Multidetector CT imaging of the abdomen and pelvis was performed following the standard protocol without IV contrast. COMPARISON:  CT abdomen and pelvis 01/09/2021 FINDINGS: Lower chest: Clear lung bases. Hepatobiliary: No focal liver abnormality is seen. Status post cholecystectomy with mildly increased extrahepatic biliary dilatation compared to the prior study with the common bile duct measuring approximately 1.1 cm in diameter proximally and tapering distally. Pancreas: Unremarkable. Spleen: Small calcification in the central aspect of the spleen. Adrenals/Urinary Tract: Unremarkable adrenal glands. No evidence of renal mass, calculi, or hydronephrosis. Nondistended bladder. 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 Bowel assessment is limited by the absence of IV and oral contrast material. There is no evidence of bowel obstruction. Distal small bowel and distal colonic inflammation on the prior CT appears improved. There is a moderate amount of right-sided colonic stool. Vascular/Lymphatic: Mild abdominal aortic atherosclerosis without aneurysm. No enlarged lymph nodes.  Reproductive: Grossly unremarkable uterus and adnexa. Other: At most trace pelvic free fluid.  No pneumoperitoneum. Musculoskeletal: No acute osseous abnormality or suspicious osseous lesion. IMPRESSION: 1. Improved distal small bowel and colonic inflammation. No evidence of bowel obstruction. 2. Mildly increased extrahepatic biliary dilatation. Recommend laboratory correlation. 3. Aortic Atherosclerosis (ICD10-I70.0). Electronically Signed   By: Logan Bores M.D.   On: 01/19/2021 10:29   CT ABDOMEN PELVIS WO CONTRAST  Result Date: 01/09/2021 CLINICAL DATA:  46 year old female with increasing abdominal pain. Distal colitis on CT several days ago. Cervical cancer. Positive labs for for C difficile on 01/05/2021. EXAM: CT ABDOMEN AND PELVIS WITHOUT CONTRAST TECHNIQUE: Multidetector CT imaging of the abdomen and pelvis was performed following the standard protocol without IV contrast. COMPARISON:  CT Abdomen and Pelvis 01/06/2021. FINDINGS: Lower chest: Negative. Hepatobiliary: Absent gallbladder. Negative noncontrast liver aside from trace inferior perihepatic free fluid with simple fluid density on series 2, image 43. Pancreas: Negative noncontrast pancreas. Spleen: Negative. Adrenals/Urinary Tract: Normal adrenal glands. Noncontrast kidneys appear stable and nonobstructed. Bladder seems to remain normal. Incidental pelvic phleboliths. Stomach/Bowel: Previous Roux-en-Y type gastric bypass. No dilated small or large bowel loops, but distal small bowel in the anterior pelvis appears severely inflamed (series 2, image 67) with pronounced wall thickening bowel wall thickening and edema continuing to the terminal ileum and ileocecal valve (image 59). Right: And transverse colon appear within normal limits. Splenic flexure within normal limits. Gradual transition in the distal descending colon 2 inflamed sigmoid and rectum with circumferential wall thickening. Oval roughly 10 mm capsule appears impacted at the  gastroesophageal junction on series 2, image 13, but there is no upstream esophageal dilatation evident. Bypassed portion of the stomach contains a small volume of fluid as before. Small volume of fluid also in the duodenum. No free air. There is trace free fluid in the abdomen. Vascular/Lymphatic: Mild Calcified aortic atherosclerosis. Normal caliber abdominal aorta. Vascular patency is not evaluated in the absence of IV contrast. Reproductive: Retroverted uterus as before. Otherwise negative noncontrast appearance. Other: Pelvic mesenteric edema but no definite pelvic free fluid. Musculoskeletal: No acute osseous abnormality identified. IMPRESSION: 1. Progressive and now severe inflammation of distal small bowel in the pelvis, including the terminal ileum. And ongoing distal colitis. Suspect Progressive Clostridium Difficile Enterocolitis in this setting. No pneumoperitoneum or pneumatosis identified. Trace free fluid. 2. Suspect impacted tablet or capsule at the GEJ. But no evidence of associated esophageal obstruction. 3. No other acute or inflammatory process identified on noncontrast CT abdomen and pelvis.  Electronically Signed   By: Genevie Ann M.D.   On: 01/09/2021 10:54   CT ABDOMEN PELVIS W CONTRAST  Result Date: 01/06/2021 CLINICAL DATA:  Abdominal cramping and diarrhea. EXAM: CT ABDOMEN AND PELVIS WITH CONTRAST TECHNIQUE: Multidetector CT imaging of the abdomen and pelvis was performed using the standard protocol following bolus administration of intravenous contrast. CONTRAST:  52mL OMNIPAQUE IOHEXOL 300 MG/ML  SOLN COMPARISON:  None. FINDINGS: Lower chest: No acute abnormality. Hepatobiliary: No focal liver abnormality is seen. Status post cholecystectomy. Common bile duct is dilated and measures 1.4 cm. Pancreas: Unremarkable. No pancreatic ductal dilatation or surrounding inflammatory changes. Spleen: Normal in size without focal abnormality. Adrenals/Urinary Tract: Adrenal glands are unremarkable.  Kidneys are normal, without renal calculi, focal lesion, or hydronephrosis. The urinary bladder is partially empty and subsequently limited in evaluation. Stomach/Bowel: Surgical sutures are seen within the gastric region. Appendix appears normal. No evidence of bowel dilatation. Mild to moderate severity diffuse colonic wall thickening is seen throughout the descending and sigmoid colon. Vascular/Lymphatic: Very mild aortic atherosclerosis. No enlarged abdominal or pelvic lymph nodes. Reproductive: The uterus is unremarkable. A 2.1 cm diameter cyst is seen along the posterior aspect of the right adnexa. Other: No abdominal wall hernia or abnormality. No abdominopelvic ascites. Musculoskeletal: No acute or significant osseous findings. IMPRESSION: 1. Mild to moderate severity infectious or inflammatory colitis involving the descending and sigmoid colon. 2. Evidence of prior cholecystectomy. 3. 2.1 cm diameter right adnexal cyst, likely ovarian in origin. 4. Very mild aortic atherosclerosis. Aortic Atherosclerosis (ICD10-I70.0). Electronically Signed   By: Virgina Norfolk M.D.   On: 01/06/2021 19:52   DG Abd 2 Views  Result Date: 01/05/2021 CLINICAL DATA:  46 year old female with abdominal pain. Recent diarrhea and constipation. Cervical cancer EXAM: ABDOMEN - 2 VIEW COMPARISON:  PET-CT 12/06/2020. FINDINGS: Upright and supine views of the abdomen and pelvis. Negative lung bases. No pneumoperitoneum. Stable cholecystectomy clips. Non obstructed bowel gas pattern. Moderate volume of retained stool in the colon. Recent PET-CT with Roux-en-Y type gastric bypass. No acute osseous abnormality identified. Pelvic phleboliths. IMPRESSION: Nonobstructed bowel-gas pattern with moderate volume of retained stool. Prior gastric bypass. Electronically Signed   By: Genevie Ann M.D.   On: 01/05/2021 11:24   DG ABD ACUTE 2+V W 1V CHEST  Result Date: 01/11/2021 CLINICAL DATA:  Right-sided abdominal pain. EXAM: DG ABDOMEN ACUTE  WITH 1 VIEW CHEST COMPARISON:  January 05, 2021 FINDINGS: There is no evidence of dilated bowel loops or free intraperitoneal air. No radiopaque calculi or other significant radiographic abnormality is seen. Radiopaque surgical clips are seen within the right upper quadrant, with radiopaque surgical sutures noted along the medial aspect of the left upper quadrant. Subcentimeter phleboliths are noted within the lower pelvis. A right-sided venous Port-A-Cath is seen with its distal tip noted at the junction of the superior vena cava and right atrium. Heart size and mediastinal contours are within normal limits. Both lungs are clear. IMPRESSION: Negative abdominal radiographs.  No acute cardiopulmonary disease. Electronically Signed   By: Virgina Norfolk M.D.   On: 01/11/2021 04:08    ASSESSMENT: Stage IIb adenocarcinoma of the cervix, now with abdominal pain and recurrent C. difficile colitis.  PLAN:    Stage IIb adenocarcinoma of the cervix: Patient last received chemotherapy with carboplatin and Taxol on January 04, 2021.  She was also receiving daily XRT, but this has been held as well.  Continue to hold treatment.  We will reinitiate treatment in 1 to 2 weeks  when acute symptoms have resolved.  Recurrent C. difficile diarrhea: Patient is symptomatically improved.  Continue current treatment as ordered.  Appreciate GI input.  Hold chemotherapy and XRT as above until acute symptoms resolved. Pain: Improved.  Continue fentanyl patch and Percocet as needed. Leukopenia: Chronic and unchanged since at least January 04, 2021. Anemia: Chronic and unchanged.  Patient most recent hemoglobin is 9.7.  Appreciate consult, will follow.   Lloyd Huger, MD   01/24/2021 12:42 PM

## 2021-01-24 NOTE — Progress Notes (Signed)
Patient ID: Vanessa Romero, female   DOB: [birth year], 46 y.o.   MRN: [id number] [facility] PROGRESS NOTE  Krisandra Bueno WYO:[id number] DOB: 1975-05-13 DOA: 01/22/2021 PCP: Anselmo Pickler, MD  HPI/Subjective: Patient feels a little bit better.  Able to tolerate solid food this morning.  Still having abdominal pain and requiring some IV pain meds.  Had an episode of diarrhea last night and stool starting to form up this morning.  Admitted with C. difficile colitis and COVID-19 infection  Objective: Vitals:   01/24/21 0606 01/24/21 1036  BP: 116/84 117/87  Pulse: 65 73  Resp: 16 16  Temp:  98.2 F (36.8 C)  SpO2: 98% 98%    Intake/Output Summary (Last 24 hours) at 01/24/2021 1256 Last data filed at 01/24/2021 1123 Gross per 24 hour  Intake 3539.56 ml  Output --  Net 3539.56 ml   Filed Weights   01/21/21 1930 01/23/21 1115  Weight: 54.4 kg 54.8 kg    ROS: Review of Systems  Respiratory:  Negative for shortness of breath.   Cardiovascular:  Negative for chest pain.  Gastrointestinal:  Positive for abdominal pain and diarrhea. Negative for nausea and vomiting.  Exam: Physical Exam HENT:     Head: Normocephalic.     Mouth/Throat:     Pharynx: No oropharyngeal exudate.  Eyes:     General: Lids are normal.     Conjunctiva/sclera: Conjunctivae normal.  Cardiovascular:     Rate and Rhythm: Normal rate and regular rhythm.     Heart sounds: Normal heart sounds, S1 normal and S2 normal.  Pulmonary:     Breath sounds: No decreased breath sounds, wheezing, rhonchi or rales.  Abdominal:     Palpations: Abdomen is soft.     Tenderness: There is generalized abdominal tenderness.  Musculoskeletal:     Right lower leg: No swelling.     Left lower leg: No swelling.  Skin:    General: Skin is warm.     Findings: No rash.  Neurological:     Mental Status: She is alert and oriented to person, place, and time.      Scheduled Meds:  ALPRAZolam  0.5 mg Oral BID    Chlorhexidine Gluconate Cloth  6 each Topical Daily   enoxaparin (LOVENOX) injection  40 mg Subcutaneous Daily   escitalopram  10 mg Oral Daily   fentaNYL  1 patch Transdermal Q72H   fidaxomicin  200 mg Oral BID   lamoTRIgine  50 mg Oral Daily   thiamine injection  100 mg Intravenous Daily   Continuous Infusions:  famotidine (PEPCID) IV Stopped (01/23/21 2157)   remdesivir 100 mg in NS 100 mL Stopped (01/24/21 1105)    Assessment/Plan:  Recurrent C. difficile colitis.  Patient improving.  Still has abdominal pain and still requiring pain meds.  Patient had a stage VII stool last night and this stage VI this morning.  Tolerated solid food this morning.  Continue p.o. Dificid.  In speaking with infectious disease pharmacist we were able to get the Dificid approved.  Hopefully will be able to discharge tomorrow.  Change Reglan to as needed. COVID-19 virus infection and gastroenteritis.  On remdesivir. Cervical cancer stage IIb.  I notified Dr. Grayland Ormond that the patient was in the hospital and chemo and radiation will be on hold until patient is feeling better. Chronic pain on Duragesic patch Seizure disorder on Lamictal Anxiety depression on Xanax and Lexapro Prior history of alcohol abuse on thiamine B12 deficiency and vitamin D deficiency  can restart supplementation upon discharge.      Code Status:     Code Status Orders  (From admission, onward)           Start     Ordered   01/22/21 0253  Full code  Continuous        01/22/21 0254           Code Status History     Date Active Date Inactive Code Status Order ID Comments User Context   01/07/2021 2147 01/14/2021 1947 Full Code [id number]  Bernadette Hoit, DO ED   11/22/2020 1306 11/27/2020 1931 Full Code [id number]  [facility], Briant Cedar, DO ED      Family Communication: Left message for patient's mother Disposition Plan: Status is: Inpatient  Antibiotics: [facility]

## 2021-01-25 ENCOUNTER — Inpatient Hospital Stay: Payer: Medicare Other

## 2021-01-25 ENCOUNTER — Inpatient Hospital Stay: Payer: Medicare Other | Admitting: Oncology

## 2021-01-25 ENCOUNTER — Ambulatory Visit: Payer: Medicare Other

## 2021-01-25 DIAGNOSIS — C539 Malignant neoplasm of cervix uteri, unspecified: Secondary | ICD-10-CM | POA: Diagnosis not present

## 2021-01-25 DIAGNOSIS — Z01818 Encounter for other preprocedural examination: Secondary | ICD-10-CM | POA: Insufficient documentation

## 2021-01-25 DIAGNOSIS — U071 COVID-19: Secondary | ICD-10-CM | POA: Diagnosis not present

## 2021-01-25 DIAGNOSIS — R112 Nausea with vomiting, unspecified: Secondary | ICD-10-CM | POA: Diagnosis not present

## 2021-01-25 DIAGNOSIS — A0472 Enterocolitis due to Clostridium difficile, not specified as recurrent: Secondary | ICD-10-CM | POA: Diagnosis not present

## 2021-01-25 MED ORDER — HEPARIN SOD (PORK) LOCK FLUSH 100 UNIT/ML IV SOLN
500.0000 [IU] | Freq: Once | INTRAVENOUS | Status: AC
  Administered 2021-01-25: 500 [IU] via INTRAVENOUS
  Filled 2021-01-25: qty 5

## 2021-01-25 MED ORDER — PROCHLORPERAZINE MALEATE 10 MG PO TABS: 10.0000 mg | ORAL_TABLET | Freq: Four times a day (QID) | ORAL | 0 refills | Status: DC | PRN

## 2021-01-25 MED ORDER — ALPRAZOLAM 0.5 MG PO TABS: 0.5000 mg | ORAL_TABLET | Freq: Two times a day (BID) | ORAL | 0 refills | Status: DC | PRN

## 2021-01-25 MED ORDER — HYDROCODONE-ACETAMINOPHEN 10-325 MG PO TABS: 1.0000 | ORAL_TABLET | Freq: Four times a day (QID) | ORAL | 0 refills | Status: AC | PRN

## 2021-01-25 NOTE — Care Management Important Message (Signed)
Important Message  Patient Details  Name: Vanessa Romero MRN: [id number] Date of Birth: 06/17/1975   Medicare Important Message Given:  Yes     [name] 01/25/2021, 11:57 AM

## 2021-01-25 NOTE — Progress Notes (Signed)
Pharmacy - Antimicrobial Stewardship  As per Merry Proud Smith's noted from 01/24/21,  patient's copay high for fidaxomicin so he submitted application and received approval via a [employer] to pay the copay w/ patient's responsibility being $0  Medication was picked up and delivered to patient's room this morning.  She was instructed how to take medication, possible side effects, and asked if she had additional questions or concerns which there were none  Doreene Eland, PharmD, BCPS, BCIDP Work Cell: [phone] 01/25/2021 8:35 AM

## 2021-01-25 NOTE — Care Management Important Message (Signed)
Important Message  Patient Details  Name: Vanessa Romero MRN: [id number] Date of Birth: 06-May-1975   Medicare Important Message Given:  Yes  Patient is in an isolation room so I reviewed the Important Message from Medicare with her by phone [phone]). She is in agreement with her discharge and I asked if she would like a copy and she declined.  I wished her well and thanked her for her time.    Juliann Pulse A Rolene Andrades 01/25/2021, 11:57 AM

## 2021-01-25 NOTE — Plan of Care (Signed)
Pt is adequate for discharge. 

## 2021-01-25 NOTE — Discharge Summary (Signed)
[facility] NAME: Vanessa Romero    MR#:  [id number]  DATE OF BIRTH:  09/07/1975  DATE OF ADMISSION:  01/22/2021 ADMITTING PHYSICIAN: Athena Masse, MD  DATE OF DISCHARGE: 01/25/2021  2:12 PM  PRIMARY CARE PHYSICIAN: Anselmo Pickler, MD    ADMISSION DIAGNOSIS:  SBO (small bowel obstruction) ([place]) [K56.609] Hypoglycemia [E16.2] C. difficile colitis [A04.72] Nausea vomiting and diarrhea [R11.2, R19.7] Uncontrolled pain [R52]  DISCHARGE DIAGNOSIS:  Recurrent C. difficile colitis Nausea vomiting diarrhea Abdominal pain COVID-19 virus infection gastroenteritis Cervical cancer stage IIb Chronic pain Seizure disorder Anxiety depression Vitamin B 12 deficiency Vitamin D deficiency  SECONDARY DIAGNOSIS:   Past Medical History:  Diagnosis Date   Alcohol abuse    Anemia    Cancer ([place])    Tobacco dependence     HOSPITAL COURSE:   1.  Recurrent C. difficile colitis.  Patient presented with severe abdominal pain nausea vomiting and diarrhea.  Initially the patient was started on IV Flagyl and rectal vancomycin.  The patient was previously on oral vancomycin as outpatient.  Once able to tolerate p.o. I did switch her over to p.o. Dificid.  In speaking with the infectious disease pharmacist we were able to get a entire course of Dificid for the patient improved.  The patient had medication in hand upon discharge.  I did prescribe a few Percocet upon discharge.  The patient was able to tolerate diet and had less diarrhea and felt ready to go home.  Small bowel obstruction was ruled out because the patient was having diarrhea.  She was seen by general surgery Dr. Psychiatry on hospital course 2.  COVID-19 infection and gastroenteritis.  The patient received remdesivir while here in the hospital and will be discontinued upon disposition. 3.  Cervical cancer stage IIb.  Follow-up with Dr. Grayland Ormond in radiation oncology as  outpatient. 4.  Chronic pain on Duragesic patch. 5.  Seizure disorder on Lamictal 6.  Anxiety depression on Xanax and Lexapro 7.  Prior history of alcohol abuse on thiamine 8.  B12 deficiency and vitamin D deficiency.  Can restart supplementation upon discharge.  DISCHARGE CONDITIONS:  Satisfactory  CONSULTS OBTAINED:  Treatment Team:  Lloyd Huger, MD  DRUG ALLERGIES:   Allergies  Allergen Reactions   Contrast Media [Iodinated Contrast Media] Hives   Gabapentin Other (See Comments), Rash and Palpitations    Other Reaction: tachycardia Other Reaction: tachycardia    Morphine Dermatitis, Hives, Rash, Swelling and Other (See Comments)    Other reaction(s): Unknown (comments) Has tolerated hydromorphone (Dilaudid) Immediate after injections arm edema and arm turned bright red Immediate after injections arm edema and arm turned bright red IV Morphine IV Morphine    Sumatriptan Dermatitis, Hives, Itching, Other (See Comments) and Swelling    Other reaction(s): Joint Pain, Other (See Comments), Other (see comments), Unknown (comments) lock jaw Lock jaw Lock jaw Lock jaw TIGHTENING OF JAW Lock jaw lock jaw Lock jaw TIGHTENING OF JAW Lock jaw    Zolpidem Nausea And Vomiting and Other (See Comments)    Other reaction(s): Other (see comments) sleep walking sleep walking Sleep walking  don't tolerate it well    Erythromycin Diarrhea, Nausea And Vomiting and Nausea Only    Extreme upset stomach    Valproic Acid Rash    Other reaction(s): Other (see comments), Unknown MOOD DISORDER MOOD DISORDER Depakote: Reaction unknown     Acetazolamide     Other reaction(s): Unknown (comments)  Erythromycin Base     Other reaction(s): UNKNOWN   Amoxicillin Rash   Divalproex Sodium Anxiety and Other (See Comments)    DISCHARGE MEDICATIONS:   Allergies as of 01/25/2021       Reactions   Contrast Media [iodinated Contrast Media] Hives   Gabapentin Other (See  Comments), Rash, Palpitations   Other Reaction: tachycardia Other Reaction: tachycardia   Morphine Dermatitis, Hives, Rash, Swelling, Other (See Comments)   Other reaction(s): Unknown (comments) Has tolerated hydromorphone (Dilaudid) Immediate after injections arm edema and arm turned bright red Immediate after injections arm edema and arm turned bright red IV Morphine IV Morphine   Sumatriptan Dermatitis, Hives, Itching, Other (See Comments), Swelling   Other reaction(s): Joint Pain, Other (See Comments), Other (see comments), Unknown (comments) lock jaw Lock jaw Lock jaw Lock jaw TIGHTENING OF JAW Lock jaw lock jaw Lock jaw TIGHTENING OF JAW Lock jaw   Zolpidem Nausea And Vomiting, Other (See Comments)   Other reaction(s): Other (see comments) sleep walking sleep walking Sleep walking  don't tolerate it well   Erythromycin Diarrhea, Nausea And Vomiting, Nausea Only   Extreme upset stomach   Valproic Acid Rash   Other reaction(s): Other (see comments), Unknown MOOD DISORDER MOOD DISORDER Depakote: Reaction unknown    Acetazolamide    Other reaction(s): Unknown (comments)   Erythromycin Base    Other reaction(s): UNKNOWN   Amoxicillin Rash   Divalproex Sodium Anxiety, Other (See Comments)        Medication List     STOP taking these medications    dicyclomine 10 MG capsule Commonly known as: BENTYL   oxyCODONE-acetaminophen 5-325 MG tablet Commonly known as: Percocet       TAKE these medications    ALPRAZolam 0.5 MG tablet Commonly known as: XANAX Take 1 tablet (0.5 mg total) by mouth 2 (two) times daily as needed for up to 5 days for anxiety.   benzonatate 200 MG capsule Commonly known as: TESSALON Take 1 capsule (200 mg total) by mouth 3 (three) times daily as needed for cough.   cyanocobalamin 1000 MCG tablet Take 1 tablet (1,000 mcg total) by mouth daily. Notes to patient: Not given in hospital   Dificid 200 MG Tabs tablet Generic drug:  fidaxomicin Take 1 tablet (200 mg total) by mouth 2 (two) times daily.   escitalopram 20 MG tablet Commonly known as: LEXAPRO Take 1 tablet (20 mg total) by mouth daily at 12 noon.   feeding supplement Liqd Take 237 mLs by mouth 3 (three) times daily between meals.   fentaNYL 25 MCG/HR Commonly known as: [place] 1 patch onto the skin every 3 (three) days.   fluticasone 50 MCG/ACT nasal spray Commonly known as: FLONASE Place 2 sprays into both nostrils daily. Notes to patient: Not given in hospital   folic acid 1 MG tablet Commonly known as: FOLVITE Take 1 tablet (1 mg total) by mouth daily.   HYDROcodone-acetaminophen 10-325 MG tablet Commonly known as: Norco Take 1 tablet by mouth every 6 (six) hours as needed for up to 3 days for moderate pain or severe pain.   lamoTRIgine 25 MG tablet Commonly known as: LAMICTAL Take 2 tablets (50 mg total) by mouth daily.   lidocaine-prilocaine cream Commonly known as: EMLA Apply to affected area once   multivitamin with minerals Tabs tablet Take 1 tablet by mouth daily. Notes to patient: Not given in hospital   naloxone 4 MG/0.1ML Liqd nasal spray kit Commonly known as: NARCAN SPRAY 1  SPRAY INTO ONE NOSTRIL AS DIRECTED FOR OPIOID OVERDOSE (TURN PERSON ON SIDE AFTER DOSE. IF NO RESPONSE IN 2-3 MINUTES OR PERSON RESPONDS BUT RELAPSES, REPEAT USING A NEW SPRAY DEVICE AND SPRAY INTO THE OTHER NOSTRIL. CALL 911 AFTER USE.) * EMERGENCY USE ONLY * Notes to patient: Not given in hospital   ondansetron 8 MG tablet Commonly known as: Zofran Take 1 tablet (8 mg total) by mouth 2 (two) times daily as needed for refractory nausea / vomiting.   prochlorperazine 10 MG tablet Commonly known as: COMPAZINE Take 1 tablet (10 mg total) by mouth every 6 (six) hours as needed (Nausea or vomiting).   sucralfate 1 g tablet Commonly known as: Carafate Take 1 tablet (1 g total) by mouth 3 (three) times daily. Notes to patient: Not given in  hospital   thiamine 100 MG tablet Take 1 tablet (100 mg total) by mouth daily.   Vitamin D3 25 MCG tablet Commonly known as: Vitamin D Take 1 tablet (1,000 Units total) by mouth daily. Notes to patient: Not given in hospital         DISCHARGE INSTRUCTIONS:   Follow-up with Dr. Grayland Ormond and radiation therapy as outpatient  If you experience worsening of your admission symptoms, develop shortness of breath, life threatening emergency, suicidal or homicidal thoughts you must seek medical attention immediately by calling 911 or calling your MD immediately  if symptoms less severe.  You Must read complete instructions/literature along with all the possible adverse reactions/side effects for all the Medicines you take and that have been prescribed to you. Take any new Medicines after you have completely understood and accept all the possible adverse reactions/side effects.   Please note  You were cared for by a hospitalist during your hospital stay. If you have any questions about your discharge medications or the care you received while you were in the hospital after you are discharged, you can call the unit and asked to speak with the hospitalist on call if the hospitalist that took care of you is not available. Once you are discharged, your primary care physician will handle any further medical issues. Please note that NO REFILLS for any discharge medications will be authorized once you are discharged, as it is imperative that you return to your primary care physician (or establish a relationship with a primary care physician if you do not have one) for your aftercare needs so that they can reassess your need for medications and monitor your lab values.    Today   CHIEF COMPLAINT:   Chief Complaint  Patient presents with   Abdominal Pain    HISTORY OF PRESENT ILLNESS:  Vanessa Romero  is a 46 y.o. female came in with abdominal pain nausea vomiting and diarrhea.   VITAL SIGNS:   Blood pressure 120/81, pulse 69, temperature 97.7 F (36.5 C), temperature source Oral, resp. rate 14, height '5\' 7"'  (1.702 m), weight 54.8 kg, last menstrual period 01/04/2021, SpO2 98 %.  I/O:   Intake/Output Summary (Last 24 hours) at 01/25/2021 1658 Last data filed at 01/25/2021 0400 Gross per 24 hour  Intake 290 ml  Output --  Net 290 ml    PHYSICAL EXAMINATION:  GENERAL:  46 y.o.-year-old patient lying in the bed with no acute distress.  EYES: Pupils equal, round, reactive to light and accommodation. No scleral icterus.  HEENT: Head atraumatic, normocephalic. Oropharynx and nasopharynx clear.  LUNGS: Normal breath sounds bilaterally, no wheezing, rales,rhonchi or crepitation. No use of accessory muscles of  respiration.  CARDIOVASCULAR: S1, S2 normal. No murmurs, rubs, or gallops.  ABDOMEN: Soft, tender, non-distended.  EXTREMITIES: No pedal edema.  NEUROLOGIC: Cranial nerves II through XII are intact. Muscle strength 5/5 in all extremities. Sensation intact. Gait not checked.  PSYCHIATRIC: The patient is alert and oriented x 3.  SKIN: Slight darker discoloration of skin on her back.  DATA REVIEW:   CBC Recent Labs  Lab 01/23/21 0410  WBC 3.0*  HGB 9.7*  HCT 30.0*  PLT 198    Chemistries  Recent Labs  Lab 01/21/21 1934 01/22/21 0720 01/23/21 0410  NA 139   < > 136  K 4.1   < > 3.6  CL 106   < > 103  CO2 27   < > 25  GLUCOSE 54*   < > 81  BUN 18   < > 9  CREATININE 0.68   < > 0.52  CALCIUM 8.7*   < > 8.3*  MG 1.9  --   --   AST 26  --  22  ALT 19  --  17  ALKPHOS 53  --  40  BILITOT 0.4  --  0.5   < > = values in this interval not displayed.     Microbiology Results  Results for orders placed or performed during the hospital encounter of 01/19/21  Urine Culture     Status: Abnormal   Collection Time: 01/19/21  6:44 AM   Specimen: Urine, Clean Catch  Result Value Ref Range Status   Specimen Description   Final    URINE, CLEAN CATCH Performed at  [facility], [address], [place], [place] [address]    Special Requests   Final    NONE Performed at [facility], [address] [place], [place] [address]    Culture MULTIPLE SPECIES PRESENT, SUGGEST RECOLLECTION (A)  Final   Report Status 01/20/2021 FINAL  Final     Management plans discussed with the patient, family (mother yesterday) and they are in agreement.  CODE STATUS:     Code Status Orders  (From admission, onward)           Start     Ordered   01/22/21 0253  Full code  Continuous        01/22/21 0254           Code Status History     Date Active Date Inactive Code Status Order ID Comments User Context   01/07/2021 2147 01/14/2021 1947 Full Code [id number]  Bernadette Hoit, DO ED   11/22/2020 1306 11/27/2020 1931 Full Code [id number]  Cox, Amy Delane Ginger, DO ED       TOTAL TIME TAKING CARE OF THIS PATIENT: 32 minutes.    Loletha Grayer M.D on 01/25/2021 at 4:58 PM    [facility]  CC: Primary care physician; Anselmo Pickler, MD

## 2021-01-25 NOTE — Progress Notes (Signed)
Port deacessed fpr discharge.

## 2021-01-26 ENCOUNTER — Ambulatory Visit
Admission: RE | Admit: 2021-01-26 | Discharge: 2021-01-26 | Disposition: A | Discharge status: Home / Self Care | Payer: Medicare Other | Source: Ambulatory Visit | Attending: Radiation Oncology | Admitting: Radiation Oncology

## 2021-01-26 ENCOUNTER — Telehealth: Payer: Self-pay | Admitting: *Deleted

## 2021-01-26 ENCOUNTER — Other Ambulatory Visit: Payer: Self-pay | Admitting: *Deleted

## 2021-01-26 ENCOUNTER — Telehealth: Payer: Self-pay | Admitting: Hospice and Palliative Medicine

## 2021-01-26 DIAGNOSIS — G893 Neoplasm related pain (acute) (chronic): Secondary | ICD-10-CM

## 2021-01-26 DIAGNOSIS — R1031 Right lower quadrant pain: Secondary | ICD-10-CM | POA: Diagnosis not present

## 2021-01-26 DIAGNOSIS — F32A Depression, unspecified: Secondary | ICD-10-CM | POA: Diagnosis not present

## 2021-01-26 DIAGNOSIS — Z8616 Personal history of COVID-19: Secondary | ICD-10-CM | POA: Diagnosis not present

## 2021-01-26 DIAGNOSIS — C539 Malignant neoplasm of cervix uteri, unspecified: Secondary | ICD-10-CM | POA: Diagnosis present

## 2021-01-26 DIAGNOSIS — Z9884 Bariatric surgery status: Secondary | ICD-10-CM | POA: Diagnosis not present

## 2021-01-26 DIAGNOSIS — Z510 Encounter for antineoplastic radiation therapy: Secondary | ICD-10-CM | POA: Diagnosis present

## 2021-01-26 DIAGNOSIS — Z5111 Encounter for antineoplastic chemotherapy: Secondary | ICD-10-CM | POA: Diagnosis not present

## 2021-01-26 DIAGNOSIS — F419 Anxiety disorder, unspecified: Secondary | ICD-10-CM | POA: Diagnosis not present

## 2021-01-26 DIAGNOSIS — R112 Nausea with vomiting, unspecified: Secondary | ICD-10-CM | POA: Diagnosis not present

## 2021-01-26 DIAGNOSIS — Z79899 Other long term (current) drug therapy: Secondary | ICD-10-CM | POA: Diagnosis not present

## 2021-01-26 DIAGNOSIS — G8929 Other chronic pain: Secondary | ICD-10-CM | POA: Diagnosis not present

## 2021-01-26 DIAGNOSIS — Z0283 Encounter for blood-alcohol and blood-drug test: Secondary | ICD-10-CM

## 2021-01-26 DIAGNOSIS — G40909 Epilepsy, unspecified, not intractable, without status epilepticus: Secondary | ICD-10-CM | POA: Diagnosis not present

## 2021-01-26 MED ORDER — ALPRAZOLAM 0.5 MG PO TABS: 0.5000 mg | ORAL_TABLET | Freq: Two times a day (BID) | ORAL | 0 refills | Status: AC | PRN

## 2021-01-26 NOTE — Telephone Encounter (Signed)
Pt called to reschedule her appt with Vonna Kotyk. Call back at [phone]

## 2021-01-26 NOTE — Addendum Note (Signed)
Addended by: Irean Hong on: 01/26/2021 02:36 PM   Modules accepted: Orders

## 2021-01-26 NOTE — Telephone Encounter (Signed)
Patient called reporting that pharmacy told her they did not get the Xanax prescription we sent. I was checking her medicine list and saw that she should not need this refill which was sent and attempted to call pharmacy to confirm that she did pick up # 30 tablets on 1/3 three days before she was hospitalized 1/6-1/10, but they were closed for lunch. I called and discussed her medications with J Borders, NP who checked the system and agreed that patient should not need refill of her pain medicine or xanax as she got refill documented on 1/3. I called patient back to discuss her medicine quanties and refill dates and she admits that she does have enough medicine to last until her appointment next Wednesday. I asked patient to please bring all her medications with her in the bottles to her appointments per agreement on her pain contract and she agrees to do so. I also asked if she was taking her medicine as prescribed and advised that she should not take more than prescribed and she admitted that she did in fact take 3 doses of 2 Hydrocodone before her last admission due to the amt of pain she was having. She agreed not to do so again. I then called Kristopher Oppenheim to cancel the prescription for Xanax sent today per verbal order Sharion Dove, NP.

## 2021-01-27 ENCOUNTER — Ambulatory Visit: Payer: Medicare Other

## 2021-01-27 ENCOUNTER — Ambulatory Visit: Payer: Medicare Other | Admitting: Gastroenterology

## 2021-01-27 ENCOUNTER — Encounter: Payer: Self-pay | Admitting: Oncology

## 2021-01-27 ENCOUNTER — Ambulatory Visit
Admission: RE | Admit: 2021-01-27 | Discharge: 2021-01-27 | Disposition: A | Discharge status: Home / Self Care | Payer: Medicare Other | Source: Ambulatory Visit | Attending: Radiation Oncology | Admitting: Radiation Oncology

## 2021-01-27 DIAGNOSIS — Z5111 Encounter for antineoplastic chemotherapy: Secondary | ICD-10-CM | POA: Diagnosis not present

## 2021-01-28 ENCOUNTER — Inpatient Hospital Stay: Payer: Medicare Other

## 2021-01-28 ENCOUNTER — Other Ambulatory Visit: Payer: Self-pay

## 2021-01-28 ENCOUNTER — Ambulatory Visit
Admission: RE | Admit: 2021-01-28 | Discharge: 2021-01-28 | Disposition: A | Discharge status: Home / Self Care | Payer: Medicare Other | Source: Ambulatory Visit | Attending: Radiation Oncology | Admitting: Radiation Oncology

## 2021-01-28 DIAGNOSIS — R309 Painful micturition, unspecified: Secondary | ICD-10-CM

## 2021-01-28 DIAGNOSIS — Z5111 Encounter for antineoplastic chemotherapy: Secondary | ICD-10-CM | POA: Diagnosis not present

## 2021-01-28 LAB — URINALYSIS, COMPLETE (UACMP) WITH MICROSCOPIC
Bacteria, UA: NONE SEEN
Glucose, UA: NEGATIVE mg/dL
Hgb urine dipstick: NEGATIVE
Ketones, ur: 5 mg/dL — AB
Nitrite: NEGATIVE
Protein, ur: 30 mg/dL — AB
Specific Gravity, Urine: 1.029 (ref 1.005–1.030)
pH: 5 (ref 5.0–8.0)

## 2021-01-30 LAB — URINE CULTURE

## 2021-01-31 ENCOUNTER — Telehealth: Payer: Self-pay | Admitting: *Deleted

## 2021-01-31 ENCOUNTER — Ambulatory Visit
Admission: RE | Admit: 2021-01-31 | Discharge: 2021-01-31 | Disposition: A | Discharge status: Home / Self Care | Payer: Medicare Other | Source: Ambulatory Visit | Attending: Radiation Oncology | Admitting: Radiation Oncology

## 2021-01-31 ENCOUNTER — Ambulatory Visit: Payer: Medicare Other

## 2021-01-31 DIAGNOSIS — Z5111 Encounter for antineoplastic chemotherapy: Secondary | ICD-10-CM | POA: Diagnosis not present

## 2021-01-31 NOTE — Telephone Encounter (Signed)
Patient called reporting that she has had n/v for past several days and that she cannot keep anything down. She has been dry heaving all day today and she states the Reglan and Compazine are not helping at all. She is asking what else she can take for it. Please advise

## 2021-01-31 NOTE — Telephone Encounter (Signed)
I spoke with patient, She will wait for a call tomorrow from Sharon or she will check when she comes for radiation therapy and ask about being seen. I told her if gets worse to go to ER over night.

## 2021-02-01 ENCOUNTER — Other Ambulatory Visit: Payer: Self-pay | Admitting: Oncology

## 2021-02-01 ENCOUNTER — Encounter: Payer: Self-pay | Admitting: Oncology

## 2021-02-01 ENCOUNTER — Ambulatory Visit
Admission: RE | Admit: 2021-02-01 | Discharge: 2021-02-01 | Disposition: A | Discharge status: Home / Self Care | Payer: Medicare Other | Source: Ambulatory Visit | Attending: Hospice and Palliative Medicine | Admitting: Hospice and Palliative Medicine

## 2021-02-01 ENCOUNTER — Ambulatory Visit
Admission: RE | Admit: 2021-02-01 | Discharge: 2021-02-01 | Disposition: A | Discharge status: Home / Self Care | Payer: Medicare Other | Source: Ambulatory Visit | Attending: Radiation Oncology | Admitting: Radiation Oncology

## 2021-02-01 ENCOUNTER — Other Ambulatory Visit: Payer: Self-pay

## 2021-02-01 ENCOUNTER — Inpatient Hospital Stay: Payer: Medicare Other

## 2021-02-01 ENCOUNTER — Ambulatory Visit
Admission: RE | Admit: 2021-02-01 | Discharge: 2021-02-01 | Disposition: A | Discharge status: Home / Self Care | Payer: Medicare Other | Attending: Hospice and Palliative Medicine | Admitting: Hospice and Palliative Medicine

## 2021-02-01 ENCOUNTER — Inpatient Hospital Stay (HOSPITAL_BASED_OUTPATIENT_CLINIC_OR_DEPARTMENT_OTHER): Payer: Medicare Other | Admitting: Hospice and Palliative Medicine

## 2021-02-01 ENCOUNTER — Ambulatory Visit: Payer: Medicare Other

## 2021-02-01 VITALS — BP 127/87 | HR 87 | Temp 98.0°F | Resp 16

## 2021-02-01 DIAGNOSIS — C539 Malignant neoplasm of cervix uteri, unspecified: Secondary | ICD-10-CM

## 2021-02-01 DIAGNOSIS — R112 Nausea with vomiting, unspecified: Secondary | ICD-10-CM

## 2021-02-01 DIAGNOSIS — G893 Neoplasm related pain (acute) (chronic): Secondary | ICD-10-CM

## 2021-02-01 DIAGNOSIS — R6889 Other general symptoms and signs: Secondary | ICD-10-CM

## 2021-02-01 DIAGNOSIS — Z5111 Encounter for antineoplastic chemotherapy: Secondary | ICD-10-CM | POA: Diagnosis not present

## 2021-02-01 DIAGNOSIS — R110 Nausea: Secondary | ICD-10-CM | POA: Diagnosis not present

## 2021-02-01 LAB — COMPREHENSIVE METABOLIC PANEL
ALT: 15 U/L (ref 0–44)
AST: 22 U/L (ref 15–41)
Albumin: 3.5 g/dL (ref 3.5–5.0)
Alkaline Phosphatase: 47 U/L (ref 38–126)
Anion gap: 9 (ref 5–15)
BUN: 16 mg/dL (ref 6–20)
CO2: 22 mmol/L (ref 22–32)
Calcium: 8.7 mg/dL — ABNORMAL LOW (ref 8.9–10.3)
Chloride: 103 mmol/L (ref 98–111)
Creatinine, Ser: 0.56 mg/dL (ref 0.44–1.00)
GFR, Estimated: >60 mL/min (ref >60)
Glucose, Bld: 100 mg/dL — ABNORMAL HIGH (ref 70–99)
Potassium: 3.4 mmol/L — ABNORMAL LOW (ref 3.5–5.1)
Sodium: 134 mmol/L — ABNORMAL LOW (ref 135–145)
Total Bilirubin: 0.2 mg/dL — ABNORMAL LOW (ref 0.3–1.2)
Total Protein: 6.5 g/dL (ref 6.5–8.1)

## 2021-02-01 LAB — CBC WITH DIFFERENTIAL/PLATELET
Abs Immature Granulocytes: 0.01 10*3/uL (ref 0.00–0.07)
Basophils Absolute: 0 10*3/uL (ref 0.0–0.1)
Basophils Relative: 1 %
Eosinophils Absolute: 0.2 10*3/uL (ref 0.0–0.5)
Eosinophils Relative: 5 %
HCT: 35.3 % — ABNORMAL LOW (ref 36.0–46.0)
Hemoglobin: 11.4 g/dL — ABNORMAL LOW (ref 12.0–15.0)
Immature Granulocytes: 0 %
Lymphocytes Relative: 23 %
Lymphs Abs: 0.9 10*3/uL (ref 0.7–4.0)
MCH: 26.8 pg (ref 26.0–34.0)
MCHC: 32.3 g/dL (ref 30.0–36.0)
MCV: 82.9 fL (ref 80.0–100.0)
Monocytes Absolute: 0.4 10*3/uL (ref 0.1–1.0)
Monocytes Relative: 10 %
Neutro Abs: 2.4 10*3/uL (ref 1.7–7.7)
Neutrophils Relative %: 61 %
Platelets: 199 10*3/uL (ref 150–400)
RBC: 4.26 MIL/uL (ref 3.87–5.11)
RDW: 22.3 % — ABNORMAL HIGH (ref 11.5–15.5)
WBC: 3.9 10*3/uL — ABNORMAL LOW (ref 4.0–10.5)
nRBC: 0 % (ref 0.0–0.2)

## 2021-02-01 LAB — URINE DRUG SCREEN, QUALITATIVE (ARMC ONLY)
Amphetamines, Ur Screen: NOT DETECTED
Barbiturates, Ur Screen: NOT DETECTED
Benzodiazepine, Ur Scrn: NOT DETECTED
Cannabinoid 50 Ng, Ur ~~LOC~~: NOT DETECTED
Cocaine Metabolite,Ur ~~LOC~~: NOT DETECTED
MDMA (Ecstasy)Ur Screen: NOT DETECTED
Methadone Scn, Ur: NOT DETECTED
Opiate, Ur Screen: NOT DETECTED
Phencyclidine (PCP) Ur S: NOT DETECTED
Tricyclic, Ur Screen: POSITIVE — AB

## 2021-02-01 LAB — URINALYSIS, COMPLETE (UACMP) WITH MICROSCOPIC
Bacteria, UA: NONE SEEN
Bilirubin Urine: NEGATIVE
Glucose, UA: NEGATIVE mg/dL
Hgb urine dipstick: NEGATIVE
Ketones, ur: 5 mg/dL — AB
Nitrite: NEGATIVE
Protein, ur: NEGATIVE mg/dL
Specific Gravity, Urine: 1.02 (ref 1.005–1.030)
pH: 5 (ref 5.0–8.0)

## 2021-02-01 LAB — MAGNESIUM: Magnesium: 1.7 mg/dL (ref 1.7–2.4)

## 2021-02-01 MED ORDER — HEPARIN SOD (PORK) LOCK FLUSH 100 UNIT/ML IV SOLN
500.0000 [IU] | Freq: Once | INTRAVENOUS | Status: AC
  Administered 2021-02-01: 500 [IU] via INTRAVENOUS
  Filled 2021-02-01: qty 5

## 2021-02-01 MED ORDER — SODIUM CHLORIDE 0.9 % IV SOLN
INTRAVENOUS | Status: DC
  Filled 2021-02-01: qty 250

## 2021-02-01 MED ORDER — SODIUM CHLORIDE 0.9% FLUSH
10.0000 mL | Freq: Once | INTRAVENOUS | Status: AC
  Administered 2021-02-01: 10 mL via INTRAVENOUS
  Filled 2021-02-01: qty 10

## 2021-02-01 NOTE — Progress Notes (Signed)
[place]  Telephone:(336) [phone] Fax:(336) [phone]  ID: Vanessa Romero OB: [birth year]  MR#: [id number]  GYK#:[id number]  Patient Care Team: Anselmo Pickler, MD as PCP - General (Internal Medicine)  CHIEF COMPLAINT: Stage IIb adenocarcinoma of the cervix.  INTERVAL HISTORY: Patient returns to clinic today for further evaluation and consideration of cycle 4 of carboplatinum and Taxol.  Treatment was delayed nearly a month secondary to multiple hospital admissions for abdominal pain and diarrhea.  She does not complain of this today.  She continues to have significant weakness and fatigue. She has no neurologic complaints.  She denies any recent fevers or illnesses.  She has a good appetite and denies weight loss. She has no chest pain, shortness of breath, cough, or hemoptysis.  She denies any nausea, vomiting, constipation, or diarrhea.  She does not complain of abdominal pain today.  She has no urinary complaints.  Patient offers no further specific complaints today.  REVIEW OF SYSTEMS:   Review of Systems  Constitutional:  Positive for malaise/fatigue. Negative for fever and weight loss.  Respiratory: Negative.  Negative for cough, hemoptysis and shortness of breath.   Cardiovascular: Negative.  Negative for chest pain and leg swelling.  Gastrointestinal: Negative.  Negative for abdominal pain.  Genitourinary: Negative.  Negative for dysuria.  Musculoskeletal:  Negative for back pain.  Skin: Negative.  Negative for rash.  Neurological:  Positive for weakness. Negative for dizziness, focal weakness and headaches.  Psychiatric/Behavioral: Negative.  The patient is not nervous/anxious.    As per HPI. Otherwise, a complete review of systems is negative.  PAST MEDICAL HISTORY: Past Medical History:  Diagnosis Date   Alcohol abuse    Anemia    Cancer ([place])    Tobacco dependence     PAST SURGICAL HISTORY: Past Surgical History:  Procedure Laterality  Date   ABDOMINAL ADHESION SURGERY     bowel obstruction     x2   CERVICAL CONIZATION W/BX N/A 11/26/2020   Procedure: CONIZATION CERVIX WITH BIOPSY;  Surgeon: Malachy Mood, MD;  Location: [facility] ORS;  Service: Gynecology;  Laterality: N/A;   ESOPHAGOGASTRODUODENOSCOPY N/A 11/24/2020   Procedure: ESOPHAGOGASTRODUODENOSCOPY (EGD);  Surgeon: Lin Landsman, MD;  Location: [facility] END[facility]COPY;  Service: Gastroenterology;  Laterality: N/A;   laparoscopic knee surgery     PORTA CATH INSERTION N/A 12/20/2020   Procedure: PORTA CATH INSERTION;  Surgeon: Algernon Huxley, MD;  Location: [facility];  Service: Cardiovascular;  Laterality: N/A;   ROUX-EN-Y GASTRIC BYPASS     TEAR DUCT PROBING     unclogg   VAGOTOMY     VENTRICULOPERITONEAL SHUNT     x6 put in and removals    FAMILY HISTORY: Family History  Problem Relation Age of Onset   Cancer Mother    Cancer Father    Cancer Maternal Grandmother     ADVANCED DIRECTIVES (Y/N):  N  HEALTH MAINTENANCE: Social History   Tobacco Use   Smoking status: Former    Types: Cigarettes   Smokeless tobacco: Former  Scientific laboratory technician Use: Former  Substance Use Topics   Alcohol use: Not Currently   Drug use: Not Currently     Colonoscopy:  PAP:  Bone density:  Lipid panel:  Allergies  Allergen Reactions   Contrast Media [Iodinated Contrast Media] Hives   Gabapentin Other (See Comments), Rash and Palpitations    Other Reaction: tachycardia Other Reaction: tachycardia    Morphine Dermatitis, Hives, Rash, Swelling and Other (See  Comments)    Other reaction(s): Unknown (comments) Has tolerated hydromorphone (Dilaudid) Immediate after injections arm edema and arm turned bright red Immediate after injections arm edema and arm turned bright red IV Morphine IV Morphine    Sumatriptan Dermatitis, Hives, Itching, Other (See Comments) and Swelling    Other reaction(s): Joint Pain, Other (See Comments), Other (see comments),  Unknown (comments) lock jaw Lock jaw Lock jaw Lock jaw TIGHTENING OF JAW Lock jaw lock jaw Lock jaw TIGHTENING OF JAW Lock jaw    Zolpidem Nausea And Vomiting and Other (See Comments)    Other reaction(s): Other (see comments) sleep walking sleep walking Sleep walking  don't tolerate it well    Erythromycin Diarrhea, Nausea And Vomiting and Nausea Only    Extreme upset stomach    Valproic Acid Rash    Other reaction(s): Other (see comments), Unknown MOOD DISORDER MOOD DISORDER Depakote: Reaction unknown     Acetazolamide     Other reaction(s): Unknown (comments)   Erythromycin Base     Other reaction(s): UNKNOWN   Amoxicillin Rash   Divalproex Sodium Anxiety and Other (See Comments)    Current Outpatient Medications  Medication Sig Dispense Refill   benzonatate (TESSALON) 200 MG capsule Take 1 capsule (200 mg total) by mouth 3 (three) times daily as needed for cough. 30 capsule 0   cholecalciferol (VITAMIN D) 25 MCG tablet Take 1 tablet (1,000 Units total) by mouth daily. 30 tablet 1   cyanocobalamin 1000 MCG tablet Take 1 tablet (1,000 mcg total) by mouth daily. 30 tablet 0   escitalopram (LEXAPRO) 20 MG tablet Take 1 tablet (20 mg total) by mouth daily at 12 noon. 30 tablet 1   feeding supplement (ENSURE ENLIVE / ENSURE PLUS) LIQD Take 237 mLs by mouth 3 (three) times daily between meals. 21330 mL 0   fluticasone (FLONASE) 50 MCG/ACT nasal spray Place 2 sprays into both nostrils daily. 16 g 0   folic acid (FOLVITE) 1 MG tablet TAKE ONE TABLET BY MOUTH DAILY 90 tablet 2   Lactobacillus (PROBIOTIC ACIDOPHILUS) TABS Take by mouth.     lamoTRIgine (LAMICTAL) 25 MG tablet Take 2 tablets (50 mg total) by mouth daily. 60 tablet 1   lidocaine-prilocaine (EMLA) cream Apply to affected area once 30 g 3   Multiple Vitamin (MULTIVITAMIN WITH MINERALS) TABS tablet Take 1 tablet by mouth daily.     ondansetron (ZOFRAN) 8 MG tablet Take 1 tablet (8 mg total) by mouth 2 (two)  times daily as needed for refractory nausea / vomiting. 60 tablet 1   prochlorperazine (COMPAZINE) 10 MG tablet Take 1 tablet (10 mg total) by mouth every 6 (six) hours as needed (Nausea or vomiting). 30 tablet 0   sucralfate (CARAFATE) 1 g tablet Take 1 tablet (1 g total) by mouth 3 (three) times daily. 90 tablet 1   thiamine 100 MG tablet Take 1 tablet (100 mg total) by mouth daily. 30 tablet 0   ALPRAZolam (XANAX) 0.5 MG tablet Take 1 tablet (0.5 mg total) by mouth 2 (two) times daily. 30 tablet 0   fentaNYL (DURAGESIC) 25 MCG/HR Place 1 patch onto the skin every 3 (three) days. 5 patch 0   HYDROcodone-acetaminophen (NORCO) 10-325 MG tablet Take 1 tablet by mouth every 6 (six) hours as needed. 60 tablet 0   naloxone (NARCAN) nasal spray 4 mg/0.1 mL SPRAY 1 SPRAY INTO ONE NOSTRIL AS DIRECTED FOR OPIOID OVERDOSE (TURN PERSON ON SIDE AFTER DOSE. IF NO RESPONSE IN 2-3 MINUTES OR  PERSON RESPONDS BUT RELAPSES, REPEAT USING A NEW SPRAY DEVICE AND SPRAY INTO THE OTHER NOSTRIL. CALL 911 AFTER USE.) * EMERGENCY USE ONLY * (Patient not taking: Reported on 02/02/2021) 1 each 0   No current facility-administered medications for this visit.   Facility-Administered Medications Ordered in Other Visits  Medication Dose Route Frequency Provider Last Rate Last Admin   heparin lock flush 100 UNIT/ML injection            heparin lock flush 100 UNIT/ML injection             OBJECTIVE: Vitals:   02/02/21 1023  BP: 120/83  Pulse: 99  Resp: 16  Temp: (!) 96.3 F (35.7 C)  SpO2: 98%     Body mass index is 20.05 kg/m.    ECOG FS:0 - Asymptomatic  General: Well-developed, well-nourished, no acute distress. Eyes: Pink conjunctiva, anicteric sclera. HEENT: Normocephalic, moist mucous membranes. Lungs: No audible wheezing or coughing. Heart: Regular rate and rhythm. Abdomen: Soft, nontender, no obvious distention. Musculoskeletal: No edema, cyanosis, or clubbing. Neuro: Alert, answering all questions  appropriately. Cranial nerves grossly intact. Skin: No rashes or petechiae noted. Psych: Normal affect.   LAB RESULTS:  Lab Results  Component Value Date   NA 137 02/02/2021   K 3.7 02/02/2021   CL 104 02/02/2021   CO2 24 02/02/2021   GLUCOSE 93 02/02/2021   BUN 9 02/02/2021   CREATININE 0.57 02/02/2021   CALCIUM 8.6 (L) 02/02/2021   PROT 6.4 (L) 02/02/2021   ALBUMIN 3.6 02/02/2021   AST 31 02/02/2021   ALT 17 02/02/2021   ALKPHOS 47 02/02/2021   BILITOT 0.2 (L) 02/02/2021   GFRNONAA >60 02/02/2021    Lab Results  Component Value Date   WBC 3.3 (L) 02/02/2021   NEUTROABS 1.7 02/02/2021   HGB 11.1 (L) 02/02/2021   HCT 34.2 (L) 02/02/2021   MCV 83.2 02/02/2021   PLT 170 02/02/2021     STUDIES: CT ABDOMEN PELVIS WO CONTRAST  Result Date: 01/22/2021 CLINICAL DATA:  Right-sided abdominal pain. EXAM: CT ABDOMEN AND PELVIS WITHOUT CONTRAST TECHNIQUE: Multidetector CT imaging of the abdomen and pelvis was performed following the standard protocol without IV contrast. COMPARISON:  January 19, 2021 FINDINGS: Lower chest: No acute abnormality. Hepatobiliary: No focal liver abnormality is seen. Status post cholecystectomy. The common bile duct measures 1.2 cm in diameter. Pancreas: Unremarkable. No pancreatic ductal dilatation or surrounding inflammatory changes. Spleen: Normal in size without focal abnormality. Adrenals/Urinary Tract: Adrenal glands are unremarkable. Kidneys are normal, without renal calculi, focal lesion, or hydronephrosis. The urinary bladder is poorly distended and subsequently limited in evaluation. Stomach/Bowel: Surgical sutures are seen within the gastric region. Surgically anastomosed bowel is also noted within the mid left abdomen. The appendix is not clearly identified. Stool is seen throughout the large bowel. Moderately inflamed small bowel loops are suspected within the pelvis. It should be noted that this is limited in evaluation in the absence of oral  contrast. A short segment of dilated small bowel is also suspected within the lower pelvis on the left (axial CT image 67 through 74, CT series 2). Vascular/Lymphatic: Very mild aortic atherosclerosis. No enlarged abdominal or pelvic lymph nodes. Reproductive: Uterus is mildly enlarged and heterogeneous in appearance. A 2.1 cm diameter cyst is noted along the posterior aspect of the right adnexa. Other: No abdominal wall hernia or abnormality. No abdominopelvic ascites. Musculoskeletal: No acute or significant osseous findings. IMPRESSION: 1. Moderate severity enteritis involving multiple loops of distal small  bowel with additional findings that may represent a subsequent distal small bowel obstruction. 2. Evidence of prior gastric bypass surgery. 3. Evidence of prior cholecystectomy. 4. 2.1 cm diameter right adnexal cyst, likely ovarian in origin. 5. Very mild aortic atherosclerosis. Aortic Atherosclerosis (ICD10-I70.0). Electronically Signed   By: Virgina Norfolk M.D.   On: 01/22/2021 01:44   CT Abdomen Pelvis Wo Contrast  Result Date: 01/19/2021 CLINICAL DATA:  Abdominal pain. Diarrhea, nausea, and vomiting. Current treatment for Clostridium difficile colitis. EXAM: CT ABDOMEN AND PELVIS WITHOUT CONTRAST TECHNIQUE: Multidetector CT imaging of the abdomen and pelvis was performed following the standard protocol without IV contrast. COMPARISON:  CT abdomen and pelvis 01/09/2021 FINDINGS: Lower chest: Clear lung bases. Hepatobiliary: No focal liver abnormality is seen. Status post cholecystectomy with mildly increased extrahepatic biliary dilatation compared to the prior study with the common bile duct measuring approximately 1.1 cm in diameter proximally and tapering distally. Pancreas: Unremarkable. Spleen: Small calcification in the central aspect of the spleen. Adrenals/Urinary Tract: Unremarkable adrenal glands. No evidence of renal mass, calculi, or hydronephrosis. Nondistended bladder. 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 Bowel assessment is limited by the absence of IV and oral contrast material. There is no evidence of bowel obstruction. Distal small bowel and distal colonic inflammation on the prior CT appears improved. There is a moderate amount of right-sided colonic stool. Vascular/Lymphatic: Mild abdominal aortic atherosclerosis without aneurysm. No enlarged lymph nodes. Reproductive: Grossly unremarkable uterus and adnexa. Other: At most trace pelvic free fluid.  No pneumoperitoneum. Musculoskeletal: No acute osseous abnormality or suspicious osseous lesion. IMPRESSION: 1. Improved distal small bowel and colonic inflammation. No evidence of bowel obstruction. 2. Mildly increased extrahepatic biliary dilatation. Recommend laboratory correlation. 3. Aortic Atherosclerosis (ICD10-I70.0). Electronically Signed   By: Logan Bores M.D.   On: 01/19/2021 10:29   CT ABDOMEN PELVIS WO CONTRAST  Result Date: 01/09/2021 CLINICAL DATA:  46 year old female with increasing abdominal pain. Distal colitis on CT several days ago. Cervical cancer. Positive labs for for C difficile on 01/05/2021. EXAM: CT ABDOMEN AND PELVIS WITHOUT CONTRAST TECHNIQUE: Multidetector CT imaging of the abdomen and pelvis was performed following the standard protocol without IV contrast. COMPARISON:  CT Abdomen and Pelvis 01/06/2021. FINDINGS: Lower chest: Negative. Hepatobiliary: Absent gallbladder. Negative noncontrast liver aside from trace inferior perihepatic free fluid with simple fluid density on series 2, image 43. Pancreas: Negative noncontrast pancreas. Spleen: Negative. Adrenals/Urinary Tract: Normal adrenal glands. Noncontrast kidneys appear stable and nonobstructed. Bladder seems to remain normal. Incidental pelvic phleboliths. Stomach/Bowel: Previous Roux-en-Y type gastric bypass. No dilated small or large bowel loops, but distal small bowel in the anterior pelvis appears severely inflamed  (series 2, image 67) with pronounced wall thickening bowel wall thickening and edema continuing to the terminal ileum and ileocecal valve (image 59). Right: And transverse colon appear within normal limits. Splenic flexure within normal limits. Gradual transition in the distal descending colon 2 inflamed sigmoid and rectum with circumferential wall thickening. Oval roughly 10 mm capsule appears impacted at the gastroesophageal junction on series 2, image 13, but there is no upstream esophageal dilatation evident. Bypassed portion of the stomach contains a small volume of fluid as before. Small volume of fluid also in the duodenum. No free air. There is trace free fluid in the abdomen. Vascular/Lymphatic: Mild Calcified aortic atherosclerosis. Normal caliber abdominal aorta. Vascular patency is not evaluated in the absence of IV contrast. Reproductive: Retroverted uterus as before. Otherwise negative noncontrast appearance. Other: Pelvic mesenteric edema  but no definite pelvic free fluid. Musculoskeletal: No acute osseous abnormality identified. IMPRESSION: 1. Progressive and now severe inflammation of distal small bowel in the pelvis, including the terminal ileum. And ongoing distal colitis. Suspect Progressive Clostridium Difficile Enterocolitis in this setting. No pneumoperitoneum or pneumatosis identified. Trace free fluid. 2. Suspect impacted tablet or capsule at the GEJ. But no evidence of associated esophageal obstruction. 3. No other acute or inflammatory process identified on noncontrast CT abdomen and pelvis. Electronically Signed   By: Genevie Ann M.D.   On: 01/09/2021 10:54   CT ABDOMEN PELVIS W CONTRAST  Result Date: 01/06/2021 CLINICAL DATA:  Abdominal cramping and diarrhea. EXAM: CT ABDOMEN AND PELVIS WITH CONTRAST TECHNIQUE: Multidetector CT imaging of the abdomen and pelvis was performed using the standard protocol following bolus administration of intravenous contrast. CONTRAST:  4mL OMNIPAQUE  IOHEXOL 300 MG/ML  SOLN COMPARISON:  None. FINDINGS: Lower chest: No acute abnormality. Hepatobiliary: No focal liver abnormality is seen. Status post cholecystectomy. Common bile duct is dilated and measures 1.4 cm. Pancreas: Unremarkable. No pancreatic ductal dilatation or surrounding inflammatory changes. Spleen: Normal in size without focal abnormality. Adrenals/Urinary Tract: Adrenal glands are unremarkable. Kidneys are normal, without renal calculi, focal lesion, or hydronephrosis. The urinary bladder is partially empty and subsequently limited in evaluation. Stomach/Bowel: Surgical sutures are seen within the gastric region. Appendix appears normal. No evidence of bowel dilatation. Mild to moderate severity diffuse colonic wall thickening is seen throughout the descending and sigmoid colon. Vascular/Lymphatic: Very mild aortic atherosclerosis. No enlarged abdominal or pelvic lymph nodes. Reproductive: The uterus is unremarkable. A 2.1 cm diameter cyst is seen along the posterior aspect of the right adnexa. Other: No abdominal wall hernia or abnormality. No abdominopelvic ascites. Musculoskeletal: No acute or significant osseous findings. IMPRESSION: 1. Mild to moderate severity infectious or inflammatory colitis involving the descending and sigmoid colon. 2. Evidence of prior cholecystectomy. 3. 2.1 cm diameter right adnexal cyst, likely ovarian in origin. 4. Very mild aortic atherosclerosis. Aortic Atherosclerosis (ICD10-I70.0). Electronically Signed   By: Virgina Norfolk M.D.   On: 01/06/2021 19:52   DG Abd 2 Views  Result Date: 02/01/2021 CLINICAL DATA:  46 year old female with right lower quadrant pain, nausea vomiting diarrhea for 2 days. Prior gastric bypass. EXAM: ABDOMEN - 2 VIEW COMPARISON:  CT Abdomen and Pelvis 01/22/2021 and earlier. FINDINGS: Upright and supine views of the abdomen and pelvis. Non obstructed bowel gas pattern. Negative lung bases. No pneumoperitoneum. Left upper quadrant,  left mid abdomen, and right upper quadrant postoperative changes with clips and staple lines. Incidental pelvic phleboliths. Partially visible mediastinal vascular catheter at the cavoatrial junction. No acute osseous abnormality identified. IMPRESSION: Normal bowel gas pattern, no free air. Prior gastric bypass and cholecystectomy. Electronically Signed   By: Genevie Ann M.D.   On: 02/01/2021 11:15   DG Abd 2 Views  Result Date: 01/05/2021 CLINICAL DATA:  46 year old female with abdominal pain. Recent diarrhea and constipation. Cervical cancer EXAM: ABDOMEN - 2 VIEW COMPARISON:  PET-CT 12/06/2020. FINDINGS: Upright and supine views of the abdomen and pelvis. Negative lung bases. No pneumoperitoneum. Stable cholecystectomy clips. Non obstructed bowel gas pattern. Moderate volume of retained stool in the colon. Recent PET-CT with Roux-en-Y type gastric bypass. No acute osseous abnormality identified. Pelvic phleboliths. IMPRESSION: Nonobstructed bowel-gas pattern with moderate volume of retained stool. Prior gastric bypass. Electronically Signed   By: Genevie Ann M.D.   On: 01/05/2021 11:24   DG ABD ACUTE 2+V W 1V CHEST  Result Date:  01/11/2021 CLINICAL DATA:  Right-sided abdominal pain. EXAM: DG ABDOMEN ACUTE WITH 1 VIEW CHEST COMPARISON:  January 05, 2021 FINDINGS: There is no evidence of dilated bowel loops or free intraperitoneal air. No radiopaque calculi or other significant radiographic abnormality is seen. Radiopaque surgical clips are seen within the right upper quadrant, with radiopaque surgical sutures noted along the medial aspect of the left upper quadrant. Subcentimeter phleboliths are noted within the lower pelvis. A right-sided venous Port-A-Cath is seen with its distal tip noted at the junction of the superior vena cava and right atrium. Heart size and mediastinal contours are within normal limits. Both lungs are clear. IMPRESSION: Negative abdominal radiographs.  No acute cardiopulmonary disease.  Electronically Signed   By: Virgina Norfolk M.D.   On: 01/11/2021 04:08    ASSESSMENT: Stage IIb adenocarcinoma of the cervix.  PLAN:    Stage IIb adenocarcinoma of the cervix: By report patient is not a surgical candidate, therefore will proceed with concurrent XRT along with weekly chemotherapy using carboplatinum and Taxol.  She also will benefit from adjuvant brachytherapy.  PET scan results from December 06, 2020 reviewed independently with no obvious evidence of malignancy outside of patient's known cervical cancer.  Treatment was delayed nearly 1 month secondary to multiple hospital admissions.  Patient reinitiated XRT yesterday.  Proceed with cycle 4 of carboplatin and Taxol today.  Return to clinic in 1 week for further evaluation and consideration of cycle 5.    Anemia: Chronic and unchanged.  Patient's hemoglobin is 11.1 today. Thrombocytopenia: Resolved. Pelvic pain: Patient does not complain of this today.  She has also signed a pain contract. Leukopenia: Mild, monitor. Diarrhea: Resolved.  Patient expressed understanding and was in agreement with this plan. She also understands that She can call clinic at any time with any questions, concerns, or complaints.    Cancer Staging  Cervical cancer, FIGO stage IIB ([place]) Staging form: Cervix Uteri, AJCC Version 9 - Clinical stage from 12/06/2020: FIGO Stage IIB (cT2b, cN0, cM0) - Signed by Lloyd Huger, MD on 12/06/2020 Stage prefix: Initial diagnosis  Lloyd Huger, MD   02/03/2021 6:38 AM

## 2021-02-01 NOTE — Telephone Encounter (Signed)
Called patient; she reports continued nausea/vomiting, and would like to be seen prior to radiation today. Appointment scheduled in [facility].

## 2021-02-01 NOTE — Progress Notes (Signed)
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  Patient was recently hospitalized 01/07/2021-01/14/2021 with moderately severe C. difficile colitis and was discharged home on oral vancomycin.  Patient was hospitalized again 01/22/2021-01/25/2021 with recurrent C. difficile colitis.  There was concern on CT for possible SBO but this was ruled out by general surgery.  Patient was also treated for COVID infection with subsequent gastroenteritis.  Patient has had poorly managed pain with multiple recent ED visits for same.  Patient was recently referred to pain management and was scheduled to see GI for follow-up but did not make that appointment.  Patient presents to [facility] today for evaluation of recurrent nausea and vomiting despite consistent dosing of antiemetics.  She says that nausea and vomiting recurred 2 days ago and she was unable to tolerate any food or drink.  She denies diarrhea, abdominal distention, but has had some right lower quadrant pain.  No fever or chills.  Patient was able to tolerate and keep down some soup this morning.  Denies any neurologic complaints. Denies recent fevers or illnesses. Denies any easy bleeding or bruising.  Denies chest pain. Denies urinary complaints. Patient offers no further specific complaints today.    PAST MEDICAL HISTORY: Past Medical History:   Diagnosis Date   Alcohol abuse    Anemia    Cancer ([place])    Tobacco dependence     PAST SURGICAL HISTORY:  Past Surgical History:  Procedure Laterality Date   ABDOMINAL ADHESION SURGERY     bowel obstruction     x2   CERVICAL CONIZATION W/BX N/A 11/26/2020   Procedure: CONIZATION CERVIX WITH BIOPSY;  Surgeon: Malachy Mood, MD;  Location: [facility] ORS;  Service: Gynecology;  Laterality: N/A;   ESOPHAGOGASTRODUODENOSCOPY N/A 11/24/2020   Procedure: ESOPHAGOGASTRODUODENOSCOPY (EGD);  Surgeon: Lin Landsman, MD;  Location: [facility] ENDOSCOPY;  Service: Gastroenterology;  Laterality: N/A;   laparoscopic knee surgery     PORTA CATH INSERTION N/A 12/20/2020   Procedure: PORTA CATH INSERTION;  Surgeon: Algernon Huxley, MD;  Location: [place] CV LAB;  Service: Cardiovascular;  Laterality: N/A;   ROUX-EN-Y GASTRIC BYPASS     TEAR DUCT PROBING     unclogg   VAGOTOMY     VENTRICULOPERITONEAL SHUNT     x6 put in and removals    HEMATOLOGY/ONCOLOGY HISTORY:  Oncology History  Cervical cancer, FIGO stage IIB ([place])  12/06/2020 Initial Diagnosis   Cervical cancer, FIGO stage IIB ([place])   12/06/2020 Cancer Staging   Staging form: Cervix Uteri, AJCC Version 9 - Clinical stage from 12/06/2020: FIGO Stage IIB (cT2b, cN0, cM0) - Signed by Lloyd Huger, MD on 12/06/2020 Stage prefix: Initial diagnosis    12/21/2020 -  Chemotherapy   Patient is on Treatment Plan : CERVICAL Carboplatin/PACLitaxel weekly x 6 weeks with XRT         ALLERGIES:  is allergic to contrast media [iodinated contrast media], gabapentin, morphine, sumatriptan, zolpidem, erythromycin, valproic acid, acetazolamide, erythromycin base, amoxicillin, and  divalproex sodium.  MEDICATIONS:  Current Outpatient Medications  Medication Sig Dispense Refill   benzonatate (TESSALON) 200 MG capsule Take 1 capsule (200 mg total) by mouth 3 (three) times daily as needed for cough. 30 capsule 0   cholecalciferol (VITAMIN D)  25 MCG tablet Take 1 tablet (1,000 Units total) by mouth daily. 30 tablet 1   cyanocobalamin 1000 MCG tablet Take 1 tablet (1,000 mcg total) by mouth daily. 30 tablet 0   escitalopram (LEXAPRO) 20 MG tablet Take 1 tablet (20 mg total) by mouth daily at 12 noon. 30 tablet 1   feeding supplement (ENSURE ENLIVE / ENSURE PLUS) LIQD Take 237 mLs by mouth 3 (three) times daily between meals. 21330 mL 0   fentaNYL (DURAGESIC) 25 MCG/HR Place 1 patch onto the skin every 3 (three) days. 5 patch 0   fidaxomicin (DIFICID) 200 MG TABS tablet Take 1 tablet (200 mg total) by mouth 2 (two) times daily. 15 tablet 0   fluticasone (FLONASE) 50 MCG/ACT nasal spray Place 2 sprays into both nostrils daily. 16 g 0   folic acid (FOLVITE) 1 MG tablet Take 1 tablet (1 mg total) by mouth daily. 30 tablet 0   lamoTRIgine (LAMICTAL) 25 MG tablet Take 2 tablets (50 mg total) by mouth daily. 60 tablet 1   lidocaine-prilocaine (EMLA) cream Apply to affected area once 30 g 3   Multiple Vitamin (MULTIVITAMIN WITH MINERALS) TABS tablet Take 1 tablet by mouth daily.     naloxone (NARCAN) nasal spray 4 mg/0.1 mL SPRAY 1 SPRAY INTO ONE NOSTRIL AS DIRECTED FOR OPIOID OVERDOSE (TURN PERSON ON SIDE AFTER DOSE. IF NO RESPONSE IN 2-3 MINUTES OR PERSON RESPONDS BUT RELAPSES, REPEAT USING A NEW SPRAY DEVICE AND SPRAY INTO THE OTHER NOSTRIL. CALL 911 AFTER USE.) * EMERGENCY USE ONLY * 1 each 0   ondansetron (ZOFRAN) 8 MG tablet Take 1 tablet (8 mg total) by mouth 2 (two) times daily as needed for refractory nausea / vomiting. 60 tablet 1   prochlorperazine (COMPAZINE) 10 MG tablet Take 1 tablet (10 mg total) by mouth every 6 (six) hours as needed (Nausea or vomiting). 30 tablet 0   sucralfate (CARAFATE) 1 g tablet Take 1 tablet (1 g total) by mouth 3 (three) times daily. 90 tablet 1   thiamine 100 MG tablet Take 1 tablet (100 mg total) by mouth daily. 30 tablet 0   No current facility-administered medications for this visit.    Facility-Administered Medications Ordered in Other Visits  Medication Dose Route Frequency Provider Last Rate Last Admin   heparin lock flush 100 UNIT/ML injection            heparin lock flush 100 unit/mL  500 Units Intravenous Once Stacyann Mcconaughy, Vonna Kotyk R, NP       sodium chloride flush (NS) 0.9 % injection 10 mL  10 mL Intravenous Once Nzinga Ferran, Kirt Boys, NP        VITAL SIGNS: LMP 01/04/2021  There were no vitals filed for this visit.  Estimated body mass index is 18.92 kg/m as calculated from the following:   Height as of 01/23/21: 5\' 7"  (1.702 m).   Weight as of 01/23/21: 120 lb 13 oz (54.8 kg).  LABS: CBC:    Component Value Date/Time   WBC 3.0 (L) 01/23/2021 0410   HGB 9.7 (L) 01/23/2021 0410   HCT 30.0 (L) 01/23/2021 0410   PLT 198 01/23/2021 0410   MCV 82.6 01/23/2021 0410   NEUTROABS 1.6 (L) 01/19/2021 4008  LYMPHSABS 1.0 01/19/2021 0642   MONOABS 0.3 01/19/2021 0642   EOSABS 0.1 01/19/2021 0642   BASOSABS 0.0 01/19/2021 0642   Comprehensive Metabolic Panel:    Component Value Date/Time   NA 136 01/23/2021 0410   K 3.6 01/23/2021 0410   CL 103 01/23/2021 0410   CO2 25 01/23/2021 0410   BUN 9 01/23/2021 0410   CREATININE 0.52 01/23/2021 0410   GLUCOSE 81 01/23/2021 0410   CALCIUM 8.3 (L) 01/23/2021 0410   AST 22 01/23/2021 0410   ALT 17 01/23/2021 0410   ALKPHOS 40 01/23/2021 0410   BILITOT 0.5 01/23/2021 0410   PROT 5.3 (L) 01/23/2021 0410   ALBUMIN 2.8 (L) 01/23/2021 0410    RADIOGRAPHIC STUDIES: CT ABDOMEN PELVIS WO CONTRAST  Result Date: 01/22/2021 CLINICAL DATA:  Right-sided abdominal pain. EXAM: CT ABDOMEN AND PELVIS WITHOUT CONTRAST TECHNIQUE: Multidetector CT imaging of the abdomen and pelvis was performed following the standard protocol without IV contrast. COMPARISON:  January 19, 2021 FINDINGS: Lower chest: No acute abnormality. Hepatobiliary: No focal liver abnormality is seen. Status post cholecystectomy. The common bile duct measures 1.2 cm in  diameter. Pancreas: Unremarkable. No pancreatic ductal dilatation or surrounding inflammatory changes. Spleen: Normal in size without focal abnormality. Adrenals/Urinary Tract: Adrenal glands are unremarkable. Kidneys are normal, without renal calculi, focal lesion, or hydronephrosis. The urinary bladder is poorly distended and subsequently limited in evaluation. Stomach/Bowel: Surgical sutures are seen within the gastric region. Surgically anastomosed bowel is also noted within the mid left abdomen. The appendix is not clearly identified. Stool is seen throughout the large bowel. Moderately inflamed small bowel loops are suspected within the pelvis. It should be noted that this is limited in evaluation in the absence of oral contrast. A short segment of dilated small bowel is also suspected within the lower pelvis on the left (axial CT image 67 through 74, CT series 2). Vascular/Lymphatic: Very mild aortic atherosclerosis. No enlarged abdominal or pelvic lymph nodes. Reproductive: Uterus is mildly enlarged and heterogeneous in appearance. A 2.1 cm diameter cyst is noted along the posterior aspect of the right adnexa. Other: No abdominal wall hernia or abnormality. No abdominopelvic ascites. Musculoskeletal: No acute or significant osseous findings. IMPRESSION: 1. Moderate severity enteritis involving multiple loops of distal small bowel with additional findings that may represent a subsequent distal small bowel obstruction. 2. Evidence of prior gastric bypass surgery. 3. Evidence of prior cholecystectomy. 4. 2.1 cm diameter right adnexal cyst, likely ovarian in origin. 5. Very mild aortic atherosclerosis. Aortic Atherosclerosis (ICD10-I70.0). Electronically Signed   By: Virgina Norfolk M.D.   On: 01/22/2021 01:44   CT Abdomen Pelvis Wo Contrast  Result Date: 01/19/2021 CLINICAL DATA:  Abdominal pain. Diarrhea, nausea, and vomiting. Current treatment for Clostridium difficile colitis. EXAM: CT ABDOMEN AND  PELVIS WITHOUT CONTRAST TECHNIQUE: Multidetector CT imaging of the abdomen and pelvis was performed following the standard protocol without IV contrast. COMPARISON:  CT abdomen and pelvis 01/09/2021 FINDINGS: Lower chest: Clear lung bases. Hepatobiliary: No focal liver abnormality is seen. Status post cholecystectomy with mildly increased extrahepatic biliary dilatation compared to the prior study with the common bile duct measuring approximately 1.1 cm in diameter proximally and tapering distally. Pancreas: Unremarkable. Spleen: Small calcification in the central aspect of the spleen. Adrenals/Urinary Tract: Unremarkable adrenal glands. No evidence of renal mass, calculi, or hydronephrosis. Nondistended bladder. 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 Bowel assessment is limited by the absence of IV and oral contrast material. There is no  evidence of bowel obstruction. Distal small bowel and distal colonic inflammation on the prior CT appears improved. There is a moderate amount of right-sided colonic stool. Vascular/Lymphatic: Mild abdominal aortic atherosclerosis without aneurysm. No enlarged lymph nodes. Reproductive: Grossly unremarkable uterus and adnexa. Other: At most trace pelvic free fluid.  No pneumoperitoneum. Musculoskeletal: No acute osseous abnormality or suspicious osseous lesion. IMPRESSION: 1. Improved distal small bowel and colonic inflammation. No evidence of bowel obstruction. 2. Mildly increased extrahepatic biliary dilatation. Recommend laboratory correlation. 3. Aortic Atherosclerosis (ICD10-I70.0). Electronically Signed   By: Logan Bores M.D.   On: 01/19/2021 10:29   CT ABDOMEN PELVIS WO CONTRAST  Result Date: 01/09/2021 CLINICAL DATA:  46 year old female with increasing abdominal pain. Distal colitis on CT several days ago. Cervical cancer. Positive labs for for C difficile on 01/05/2021. EXAM: CT ABDOMEN AND PELVIS WITHOUT CONTRAST TECHNIQUE:  Multidetector CT imaging of the abdomen and pelvis was performed following the standard protocol without IV contrast. COMPARISON:  CT Abdomen and Pelvis 01/06/2021. FINDINGS: Lower chest: Negative. Hepatobiliary: Absent gallbladder. Negative noncontrast liver aside from trace inferior perihepatic free fluid with simple fluid density on series 2, image 43. Pancreas: Negative noncontrast pancreas. Spleen: Negative. Adrenals/Urinary Tract: Normal adrenal glands. Noncontrast kidneys appear stable and nonobstructed. Bladder seems to remain normal. Incidental pelvic phleboliths. Stomach/Bowel: Previous Roux-en-Y type gastric bypass. No dilated small or large bowel loops, but distal small bowel in the anterior pelvis appears severely inflamed (series 2, image 67) with pronounced wall thickening bowel wall thickening and edema continuing to the terminal ileum and ileocecal valve (image 59). Right: And transverse colon appear within normal limits. Splenic flexure within normal limits. Gradual transition in the distal descending colon 2 inflamed sigmoid and rectum with circumferential wall thickening. Oval roughly 10 mm capsule appears impacted at the gastroesophageal junction on series 2, image 13, but there is no upstream esophageal dilatation evident. Bypassed portion of the stomach contains a small volume of fluid as before. Small volume of fluid also in the duodenum. No free air. There is trace free fluid in the abdomen. Vascular/Lymphatic: Mild Calcified aortic atherosclerosis. Normal caliber abdominal aorta. Vascular patency is not evaluated in the absence of IV contrast. Reproductive: Retroverted uterus as before. Otherwise negative noncontrast appearance. Other: Pelvic mesenteric edema but no definite pelvic free fluid. Musculoskeletal: No acute osseous abnormality identified. IMPRESSION: 1. Progressive and now severe inflammation of distal small bowel in the pelvis, including the terminal ileum. And ongoing distal  colitis. Suspect Progressive Clostridium Difficile Enterocolitis in this setting. No pneumoperitoneum or pneumatosis identified. Trace free fluid. 2. Suspect impacted tablet or capsule at the GEJ. But no evidence of associated esophageal obstruction. 3. No other acute or inflammatory process identified on noncontrast CT abdomen and pelvis. Electronically Signed   By: Genevie Ann M.D.   On: 01/09/2021 10:54   CT ABDOMEN PELVIS W CONTRAST  Result Date: 01/06/2021 CLINICAL DATA:  Abdominal cramping and diarrhea. EXAM: CT ABDOMEN AND PELVIS WITH CONTRAST TECHNIQUE: Multidetector CT imaging of the abdomen and pelvis was performed using the standard protocol following bolus administration of intravenous contrast. CONTRAST:  63mL OMNIPAQUE IOHEXOL 300 MG/ML  SOLN COMPARISON:  None. FINDINGS: Lower chest: No acute abnormality. Hepatobiliary: No focal liver abnormality is seen. Status post cholecystectomy. Common bile duct is dilated and measures 1.4 cm. Pancreas: Unremarkable. No pancreatic ductal dilatation or surrounding inflammatory changes. Spleen: Normal in size without focal abnormality. Adrenals/Urinary Tract: Adrenal glands are unremarkable. Kidneys are normal, without renal calculi, focal lesion, or hydronephrosis.  The urinary bladder is partially empty and subsequently limited in evaluation. Stomach/Bowel: Surgical sutures are seen within the gastric region. Appendix appears normal. No evidence of bowel dilatation. Mild to moderate severity diffuse colonic wall thickening is seen throughout the descending and sigmoid colon. Vascular/Lymphatic: Very mild aortic atherosclerosis. No enlarged abdominal or pelvic lymph nodes. Reproductive: The uterus is unremarkable. A 2.1 cm diameter cyst is seen along the posterior aspect of the right adnexa. Other: No abdominal wall hernia or abnormality. No abdominopelvic ascites. Musculoskeletal: No acute or significant osseous findings. IMPRESSION: 1. Mild to moderate severity  infectious or inflammatory colitis involving the descending and sigmoid colon. 2. Evidence of prior cholecystectomy. 3. 2.1 cm diameter right adnexal cyst, likely ovarian in origin. 4. Very mild aortic atherosclerosis. Aortic Atherosclerosis (ICD10-I70.0). Electronically Signed   By: Virgina Norfolk M.D.   On: 01/06/2021 19:52   DG Abd 2 Views  Result Date: 01/05/2021 CLINICAL DATA:  46 year old female with abdominal pain. Recent diarrhea and constipation. Cervical cancer EXAM: ABDOMEN - 2 VIEW COMPARISON:  PET-CT 12/06/2020. FINDINGS: Upright and supine views of the abdomen and pelvis. Negative lung bases. No pneumoperitoneum. Stable cholecystectomy clips. Non obstructed bowel gas pattern. Moderate volume of retained stool in the colon. Recent PET-CT with Roux-en-Y type gastric bypass. No acute osseous abnormality identified. Pelvic phleboliths. IMPRESSION: Nonobstructed bowel-gas pattern with moderate volume of retained stool. Prior gastric bypass. Electronically Signed   By: Genevie Ann M.D.   On: 01/05/2021 11:24   DG ABD ACUTE 2+V W 1V CHEST  Result Date: 01/11/2021 CLINICAL DATA:  Right-sided abdominal pain. EXAM: DG ABDOMEN ACUTE WITH 1 VIEW CHEST COMPARISON:  January 05, 2021 FINDINGS: There is no evidence of dilated bowel loops or free intraperitoneal air. No radiopaque calculi or other significant radiographic abnormality is seen. Radiopaque surgical clips are seen within the right upper quadrant, with radiopaque surgical sutures noted along the medial aspect of the left upper quadrant. Subcentimeter phleboliths are noted within the lower pelvis. A right-sided venous Port-A-Cath is seen with its distal tip noted at the junction of the superior vena cava and right atrium. Heart size and mediastinal contours are within normal limits. Both lungs are clear. IMPRESSION: Negative abdominal radiographs.  No acute cardiopulmonary disease. Electronically Signed   By: Virgina Norfolk M.D.   On: 01/11/2021  04:08    PERFORMANCE STATUS (ECOG) : 1 - Symptomatic but completely ambulatory  Review of Systems Unless otherwise noted, a complete review of systems is negative.  Physical Exam General: NAD Cardiovascular: regular rate and rhythm Pulmonary: clear ant fields Abdomen: soft, tenderness right lower quadrant, + bowel sounds GU: no suprapubic tenderness Extremities: no edema, no joint deformities Skin: no rashes Neurological: Weakness but otherwise nonfocal  Assessment and Plan- Patient is a 46 y.o. 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   Nausea/vomiting  -patient is nontoxic-appearing.  She does have slight tenderness but no guarding to right lower quadrant.  She was able to keep down soup this morning without vomiting today.  Labs are unchanged from baseline.  We will send patient for abdominal x-ray to rule out obstructive gas pattern.  Patient is pending outpatient GI follow-up.  Continue antiemetics for now.  IV fluids in clinic today.  Case and plan discussed with Dr. Grayland Ormond.  Patient is planned  to RTC tomorrow for labs/MD visit   Patient expressed understanding and was in agreement with this plan. She also understands that She can call clinic at any time with any questions, concerns, or complaints.   Thank you for allowing me to participate in the care of this very pleasant patient.   Time Total: 20 minutes  Visit consisted of counseling and education dealing with the complex and emotionally intense issues of symptom management in the setting of serious illness.Greater than 50%  of this time was spent counseling and coordinating care related to the above assessment and plan.  Signed by: Altha Harm, PhD,  NP-C

## 2021-02-02 ENCOUNTER — Inpatient Hospital Stay: Payer: Medicare Other

## 2021-02-02 ENCOUNTER — Ambulatory Visit: Payer: Medicare Other

## 2021-02-02 ENCOUNTER — Encounter: Payer: Self-pay | Admitting: Oncology

## 2021-02-02 ENCOUNTER — Ambulatory Visit
Admission: RE | Admit: 2021-02-02 | Discharge: 2021-02-02 | Disposition: A | Discharge status: Home / Self Care | Payer: Medicare Other | Source: Ambulatory Visit | Attending: Radiation Oncology | Admitting: Radiation Oncology

## 2021-02-02 ENCOUNTER — Inpatient Hospital Stay (HOSPITAL_BASED_OUTPATIENT_CLINIC_OR_DEPARTMENT_OTHER): Payer: Medicare Other | Admitting: Oncology

## 2021-02-02 ENCOUNTER — Inpatient Hospital Stay (HOSPITAL_BASED_OUTPATIENT_CLINIC_OR_DEPARTMENT_OTHER): Payer: Medicare Other | Admitting: Hospice and Palliative Medicine

## 2021-02-02 VITALS — BP 120/83 | HR 99 | Temp 96.3°F | Resp 16 | Wt 128.0 lb

## 2021-02-02 DIAGNOSIS — C539 Malignant neoplasm of cervix uteri, unspecified: Secondary | ICD-10-CM

## 2021-02-02 DIAGNOSIS — M899 Disorder of bone, unspecified: Secondary | ICD-10-CM | POA: Insufficient documentation

## 2021-02-02 DIAGNOSIS — Z79899 Other long term (current) drug therapy: Secondary | ICD-10-CM | POA: Insufficient documentation

## 2021-02-02 DIAGNOSIS — Z79891 Long term (current) use of opiate analgesic: Secondary | ICD-10-CM | POA: Insufficient documentation

## 2021-02-02 DIAGNOSIS — G893 Neoplasm related pain (acute) (chronic): Secondary | ICD-10-CM

## 2021-02-02 DIAGNOSIS — G894 Chronic pain syndrome: Secondary | ICD-10-CM | POA: Insufficient documentation

## 2021-02-02 DIAGNOSIS — Z515 Encounter for palliative care: Secondary | ICD-10-CM | POA: Diagnosis not present

## 2021-02-02 DIAGNOSIS — Z5111 Encounter for antineoplastic chemotherapy: Secondary | ICD-10-CM | POA: Diagnosis not present

## 2021-02-02 DIAGNOSIS — Z789 Other specified health status: Secondary | ICD-10-CM | POA: Insufficient documentation

## 2021-02-02 LAB — CBC WITH DIFFERENTIAL/PLATELET
Abs Immature Granulocytes: 0.01 10*3/uL (ref 0.00–0.07)
Basophils Absolute: 0 10*3/uL (ref 0.0–0.1)
Basophils Relative: 0 %
Eosinophils Absolute: 0.3 10*3/uL (ref 0.0–0.5)
Eosinophils Relative: 10 %
HCT: 34.2 % — ABNORMAL LOW (ref 36.0–46.0)
Hemoglobin: 11.1 g/dL — ABNORMAL LOW (ref 12.0–15.0)
Immature Granulocytes: 0 %
Lymphocytes Relative: 26 %
Lymphs Abs: 0.9 10*3/uL (ref 0.7–4.0)
MCH: 27 pg (ref 26.0–34.0)
MCHC: 32.5 g/dL (ref 30.0–36.0)
MCV: 83.2 fL (ref 80.0–100.0)
Monocytes Absolute: 0.4 10*3/uL (ref 0.1–1.0)
Monocytes Relative: 13 %
Neutro Abs: 1.7 10*3/uL (ref 1.7–7.7)
Neutrophils Relative %: 51 %
Platelets: 170 10*3/uL (ref 150–400)
RBC: 4.11 MIL/uL (ref 3.87–5.11)
RDW: 21.5 % — ABNORMAL HIGH (ref 11.5–15.5)
WBC: 3.3 10*3/uL — ABNORMAL LOW (ref 4.0–10.5)
nRBC: 0 % (ref 0.0–0.2)

## 2021-02-02 LAB — COMPREHENSIVE METABOLIC PANEL
ALT: 17 U/L (ref 0–44)
AST: 31 U/L (ref 15–41)
Albumin: 3.6 g/dL (ref 3.5–5.0)
Alkaline Phosphatase: 47 U/L (ref 38–126)
Anion gap: 9 (ref 5–15)
BUN: 9 mg/dL (ref 6–20)
CO2: 24 mmol/L (ref 22–32)
Calcium: 8.6 mg/dL — ABNORMAL LOW (ref 8.9–10.3)
Chloride: 104 mmol/L (ref 98–111)
Creatinine, Ser: 0.57 mg/dL (ref 0.44–1.00)
GFR, Estimated: >60 mL/min (ref >60)
Glucose, Bld: 93 mg/dL (ref 70–99)
Potassium: 3.7 mmol/L (ref 3.5–5.1)
Sodium: 137 mmol/L (ref 135–145)
Total Bilirubin: 0.2 mg/dL — ABNORMAL LOW (ref 0.3–1.2)
Total Protein: 6.4 g/dL — ABNORMAL LOW (ref 6.5–8.1)

## 2021-02-02 LAB — URINE CULTURE

## 2021-02-02 MED ORDER — DIPHENHYDRAMINE HCL 50 MG/ML IJ SOLN
25.0000 mg | Freq: Once | INTRAMUSCULAR | Status: AC
  Administered 2021-02-02: 25 mg via INTRAVENOUS
  Filled 2021-02-02: qty 1

## 2021-02-02 MED ORDER — SODIUM CHLORIDE 0.9 % IV SOLN
40.0000 mg/m2 | Freq: Once | INTRAVENOUS | Status: AC
  Administered 2021-02-02: 66 mg via INTRAVENOUS
  Filled 2021-02-02: qty 11

## 2021-02-02 MED ORDER — SODIUM CHLORIDE 0.9 % IV SOLN
Freq: Once | INTRAVENOUS | Status: AC
  Filled 2021-02-02: qty 250

## 2021-02-02 MED ORDER — FENTANYL 25 MCG/HR TD PT72: 1.0000 | MEDICATED_PATCH | TRANSDERMAL | 0 refills | Status: DC

## 2021-02-02 MED ORDER — SODIUM CHLORIDE 0.9 % IV SOLN
10.0000 mg | Freq: Once | INTRAVENOUS | Status: AC
  Administered 2021-02-02: 10 mg via INTRAVENOUS
  Filled 2021-02-02: qty 10

## 2021-02-02 MED ORDER — FAMOTIDINE IN NACL 20-0.9 MG/50ML-% IV SOLN
20.0000 mg | Freq: Once | INTRAVENOUS | Status: AC
  Administered 2021-02-02: 20 mg via INTRAVENOUS
  Filled 2021-02-02: qty 50

## 2021-02-02 MED ORDER — PALONOSETRON HCL INJECTION 0.25 MG/5ML
0.2500 mg | Freq: Once | INTRAVENOUS | Status: AC
  Administered 2021-02-02: 0.25 mg via INTRAVENOUS
  Filled 2021-02-02: qty 5

## 2021-02-02 MED ORDER — SODIUM CHLORIDE 0.9 % IV SOLN
220.8000 mg | Freq: Once | INTRAVENOUS | Status: AC
  Administered 2021-02-02: 220 mg via INTRAVENOUS
  Filled 2021-02-02: qty 22

## 2021-02-02 MED ORDER — HEPARIN SOD (PORK) LOCK FLUSH 100 UNIT/ML IV SOLN
INTRAVENOUS | Status: AC
  Filled 2021-02-02: qty 5

## 2021-02-02 MED ORDER — DIPHENHYDRAMINE HCL 50 MG/ML IJ SOLN
INTRAMUSCULAR | Status: AC
  Filled 2021-02-02: qty 1

## 2021-02-02 MED ORDER — ALPRAZOLAM 0.5 MG PO TABS: 0.5000 mg | ORAL_TABLET | Freq: Two times a day (BID) | ORAL | 0 refills | Status: DC

## 2021-02-02 MED ORDER — HEPARIN SOD (PORK) LOCK FLUSH 100 UNIT/ML IV SOLN
500.0000 [IU] | Freq: Once | INTRAVENOUS | Status: AC | PRN
  Administered 2021-02-02: 500 [IU]
  Filled 2021-02-02: qty 5

## 2021-02-02 MED ORDER — HYDROCODONE-ACETAMINOPHEN 10-325 MG PO TABS
1.0000 | ORAL_TABLET | Freq: Four times a day (QID) | ORAL | 0 refills | Status: DC | PRN
Start: 2021-02-02 — End: 2021-02-15

## 2021-02-02 NOTE — Progress Notes (Signed)
Nutrition Assessment: ° °Referral from inpatient RD team ° °46 year old female with stage IIb cervical cancer on chemotherapy with carboplatin/taxol and radiation.  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  Recent hospital admission with cdiff and COVID.   ° °Met with patient during infusion.  Patient reports nausea but better after getting fluids yesterday.  Says that she has been trying to take nausea medications around the clock.  Says mylanta has helped as well.  Diarrhea is better.  Says that appetite is low and only thing eaten today was a few potato chips.  Thought of food makes her nauseated.   ° ° °Medications: reviewed, not taking a bariatric specific MVI ° °Labs: reviewed ° °Anthropometrics:  ° °Height: 67 inches °Weight: 128 lb °130 lb 1.6 oz on 12/01/2020 °BMI: 20 ° °1% weight loss in the last 2 months ° °Estimated Energy Needs ° °Kcals: 1450-1700 °Protein: 72-85 g °Fluid: 1.4 L ° °NUTRITION DIAGNOSIS: Inadequate oral intake related to cancer related treatment side effects, recent hospitalizations for c-diff and COVID as evidenced by 1% weight loss and poor po intake ° ° °INTERVENTION:  °Recommend bariatric specific MVI daily.  Names given to patient of options.  °Recommend utilize nausea medication and come in to clinic for fluid support. °Discussed small frequent snacks, nibbles and foods better tolerated with nausea. Nausea and Vomiting handout given from [facility].   °Samples of [employer] 1.4 shake given for patient to try (higher calories than 1.0).   °Contact information given.  ° °MONITORING, EVALUATION, GOAL: weight trends, intake ° ° °NEXT VISIT: Thursday, Feb 2nd phone call ° ° B. , RD, LDN °Registered Dietitian °[phone] (mobile) ° ° °

## 2021-02-02 NOTE — Patient Instructions (Signed)
[facility] [place]  Discharge Instructions: Thank you for choosing [facility] to provide your oncology and hematology care.  If you have a lab appointment with the [facility], please go directly to the [facility] and check in at the registration area.  Wear comfortable clothing and clothing appropriate for easy access to any Portacath or PICC line.   We strive to give you quality time with your provider. You may need to reschedule your appointment if you arrive late (15 or more minutes).  Arriving late affects you and other patients whose appointments are after yours.  Also, if you miss three or more appointments without notifying the office, you may be dismissed from the clinic at the providers discretion.      For prescription refill requests, have your pharmacy contact our office and allow 72 hours for refills to be completed.    Today you received the following chemotherapy and/or immunotherapy agents CARBOPLATIN and TAXOL      To help prevent nausea and vomiting after your treatment, we encourage you to take your nausea medication as directed.  BELOW ARE SYMPTOMS THAT SHOULD BE REPORTED IMMEDIATELY: *FEVER GREATER THAN 100.4 F (38 C) OR HIGHER *CHILLS OR SWEATING *NAUSEA AND VOMITING THAT IS NOT CONTROLLED WITH YOUR NAUSEA MEDICATION *UNUSUAL SHORTNESS OF BREATH *UNUSUAL BRUISING OR BLEEDING *URINARY PROBLEMS (pain or burning when urinating, or frequent urination) *BOWEL PROBLEMS (unusual diarrhea, constipation, pain near the anus) TENDERNESS IN MOUTH AND THROAT WITH OR WITHOUT PRESENCE OF ULCERS (sore throat, sores in mouth, or a toothache) UNUSUAL RASH, SWELLING OR PAIN  UNUSUAL VAGINAL DISCHARGE OR ITCHING   Items with * indicate a potential emergency and should be followed up as soon as possible or go to the Emergency Department if any problems should occur.  Please show the CHEMOTHERAPY ALERT CARD or IMMUNOTHERAPY ALERT CARD at  check-in to the Emergency Department and triage nurse.  Should you have questions after your visit or need to cancel or reschedule your appointment, please contact [facility] [place] [facility] [place]  [phone] and follow the prompts.  Office hours are 8:00 a.m. to 4:30 p.m. Monday - Friday. Please note that voicemails left after 4:00 p.m. may not be returned until the following business day.  We are closed weekends and major holidays. You have access to a nurse at all times for urgent questions. Please call the main number to the clinic [phone] and follow the prompts.  For any non-urgent questions, you may also contact your provider using MyChart. We now offer e-Visits for anyone 68 and older to request care online for non-urgent symptoms. For details visit [email or web address].   Also download the MyChart app! Go to the app store, search "MyChart", open the app, select [facility], and log in with your MyChart username and password.  Due to Covid, a mask is required upon entering the hospital/clinic. If you do not have a mask, one will be given to you upon arrival. For doctor visits, patients may have 1 support person aged 37 or older with them. For treatment visits, patients cannot have anyone with them due to current Covid guidelines and our immunocompromised population.   Carboplatin injection What is this medication? CARBOPLATIN (KAR boe pla tin) is a chemotherapy drug. It targets fast dividing cells, like cancer cells, and causes these cells to die. This medicine is used to treat ovarian cancer and many other cancers. This medicine may be used for other purposes; ask your health care  provider or pharmacist if you have questions. COMMON BRAND NAME(S): Paraplatin What should I tell my care team before I take this medication? They need to know if you have any of these conditions: blood disorders hearing problems kidney disease recent or ongoing radiation therapy an  unusual or allergic reaction to carboplatin, cisplatin, other chemotherapy, other medicines, foods, dyes, or preservatives pregnant or trying to get pregnant breast-feeding How should I use this medication? This drug is usually given as an infusion into a vein. It is administered in a hospital or clinic by a specially trained health care professional. Talk to your pediatrician regarding the use of this medicine in children. Special care may be needed. Overdosage: If you think you have taken too much of this medicine contact a poison control center or emergency room at once. NOTE: This medicine is only for you. Do not share this medicine with others. What if I miss a dose? It is important not to miss a dose. Call your doctor or health care professional if you are unable to keep an appointment. What may interact with this medication? medicines for seizures medicines to increase blood counts like filgrastim, pegfilgrastim, sargramostim some antibiotics like amikacin, gentamicin, neomycin, streptomycin, tobramycin vaccines Talk to your doctor or health care professional before taking any of these medicines: acetaminophen aspirin ibuprofen ketoprofen naproxen This list may not describe all possible interactions. Give your health care provider a list of all the medicines, herbs, non-prescription drugs, or dietary supplements you use. Also tell them if you smoke, drink alcohol, or use illegal drugs. Some items may interact with your medicine. What should I watch for while using this medication? Your condition will be monitored carefully while you are receiving this medicine. You will need important blood work done while you are taking this medicine. This drug may make you feel generally unwell. This is not uncommon, as chemotherapy can affect healthy cells as well as cancer cells. Report any side effects. Continue your course of treatment even though you feel ill unless your doctor tells you to  stop. In some cases, you may be given additional medicines to help with side effects. Follow all directions for their use. Call your doctor or health care professional for advice if you get a fever, chills or sore throat, or other symptoms of a cold or flu. Do not treat yourself. This drug decreases your body's ability to fight infections. Try to avoid being around people who are sick. This medicine may increase your risk to bruise or bleed. Call your doctor or health care professional if you notice any unusual bleeding. Be careful brushing and flossing your teeth or using a toothpick because you may get an infection or bleed more easily. If you have any dental work done, tell your dentist you are receiving this medicine. Avoid taking products that contain aspirin, acetaminophen, ibuprofen, naproxen, or ketoprofen unless instructed by your doctor. These medicines may hide a fever. Do not become pregnant while taking this medicine. Women should inform their doctor if they wish to become pregnant or think they might be pregnant. There is a potential for serious side effects to an unborn child. Talk to your health care professional or pharmacist for more information. Do not breast-feed an infant while taking this medicine. What side effects may I notice from receiving this medication? Side effects that you should report to your doctor or health care professional as soon as possible: allergic reactions like skin rash, itching or hives, swelling of the face, lips,  or tongue signs of infection - fever or chills, cough, sore throat, pain or difficulty passing urine signs of decreased platelets or bleeding - bruising, pinpoint red spots on the skin, black, tarry stools, nosebleeds signs of decreased red blood cells - unusually weak or tired, fainting spells, lightheadedness breathing problems changes in hearing changes in vision chest pain high blood pressure low blood counts - This drug may decrease the  number of white blood cells, red blood cells and platelets. You may be at increased risk for infections and bleeding. nausea and vomiting pain, swelling, redness or irritation at the injection site pain, tingling, numbness in the hands or feet problems with balance, talking, walking trouble passing urine or change in the amount of urine Side effects that usually do not require medical attention (report to your doctor or health care professional if they continue or are bothersome): hair loss loss of appetite metallic taste in the mouth or changes in taste This list may not describe all possible side effects. Call your doctor for medical advice about side effects. You may report side effects to FDA at 1-800-FDA-1088. Where should I keep my medication? This drug is given in a hospital or clinic and will not be stored at home. NOTE: This sheet is a summary. It may not cover all possible information. If you have questions about this medicine, talk to your doctor, pharmacist, or health care provider.  2022 Elsevier/Gold Standard (2007-06-12 00:00:00)   Paclitaxel injection What is this medication? PACLITAXEL (PAK li TAX el) is a chemotherapy drug. It targets fast dividing cells, like cancer cells, and causes these cells to die. This medicine is used to treat ovarian cancer, breast cancer, lung cancer, Kaposi's sarcoma, and other cancers. This medicine may be used for other purposes; ask your health care provider or pharmacist if you have questions. COMMON BRAND NAME(S): Onxol, Taxol What should I tell my care team before I take this medication? They need to know if you have any of these conditions: history of irregular heartbeat liver disease low blood counts, like low white cell, platelet, or red cell counts lung or breathing disease, like asthma tingling of the fingers or toes, or other nerve disorder an unusual or allergic reaction to paclitaxel, alcohol, polyoxyethylated castor oil, other  chemotherapy, other medicines, foods, dyes, or preservatives pregnant or trying to get pregnant breast-feeding How should I use this medication? This drug is given as an infusion into a vein. It is administered in a hospital or clinic by a specially trained health care professional. Talk to your pediatrician regarding the use of this medicine in children. Special care may be needed. Overdosage: If you think you have taken too much of this medicine contact a poison control center or emergency room at once. NOTE: This medicine is only for you. Do not share this medicine with others. What if I miss a dose? It is important not to miss your dose. Call your doctor or health care professional if you are unable to keep an appointment. What may interact with this medication? Do not take this medicine with any of the following medications: live virus vaccines This medicine may also interact with the following medications: antiviral medicines for hepatitis, HIV or AIDS certain antibiotics like erythromycin and clarithromycin certain medicines for fungal infections like ketoconazole and itraconazole certain medicines for seizures like carbamazepine, phenobarbital, phenytoin gemfibrozil nefazodone rifampin St. John's wort This list may not describe all possible interactions. Give your health care provider a list of all the  medicines, herbs, non-prescription drugs, or dietary supplements you use. Also tell them if you smoke, drink alcohol, or use illegal drugs. Some items may interact with your medicine. What should I watch for while using this medication? Your condition will be monitored carefully while you are receiving this medicine. You will need important blood work done while you are taking this medicine. This medicine can cause serious allergic reactions. To reduce your risk you will need to take other medicine(s) before treatment with this medicine. If you experience allergic reactions like skin  rash, itching or hives, swelling of the face, lips, or tongue, tell your doctor or health care professional right away. In some cases, you may be given additional medicines to help with side effects. Follow all directions for their use. This drug may make you feel generally unwell. This is not uncommon, as chemotherapy can affect healthy cells as well as cancer cells. Report any side effects. Continue your course of treatment even though you feel ill unless your doctor tells you to stop. Call your doctor or health care professional for advice if you get a fever, chills or sore throat, or other symptoms of a cold or flu. Do not treat yourself. This drug decreases your body's ability to fight infections. Try to avoid being around people who are sick. This medicine may increase your risk to bruise or bleed. Call your doctor or health care professional if you notice any unusual bleeding. Be careful brushing and flossing your teeth or using a toothpick because you may get an infection or bleed more easily. If you have any dental work done, tell your dentist you are receiving this medicine. Avoid taking products that contain aspirin, acetaminophen, ibuprofen, naproxen, or ketoprofen unless instructed by your doctor. These medicines may hide a fever. Do not become pregnant while taking this medicine. Women should inform their doctor if they wish to become pregnant or think they might be pregnant. There is a potential for serious side effects to an unborn child. Talk to your health care professional or pharmacist for more information. Do not breast-feed an infant while taking this medicine. Men are advised not to father a child while receiving this medicine. This product may contain alcohol. Ask your pharmacist or healthcare provider if this medicine contains alcohol. Be sure to tell all healthcare providers you are taking this medicine. Certain medicines, like metronidazole and disulfiram, can cause an unpleasant  reaction when taken with alcohol. The reaction includes flushing, headache, nausea, vomiting, sweating, and increased thirst. The reaction can last from 30 minutes to several hours. What side effects may I notice from receiving this medication? Side effects that you should report to your doctor or health care professional as soon as possible: allergic reactions like skin rash, itching or hives, swelling of the face, lips, or tongue breathing problems changes in vision fast, irregular heartbeat high or low blood pressure mouth sores pain, tingling, numbness in the hands or feet signs of decreased platelets or bleeding - bruising, pinpoint red spots on the skin, black, tarry stools, blood in the urine signs of decreased red blood cells - unusually weak or tired, feeling faint or lightheaded, falls signs of infection - fever or chills, cough, sore throat, pain or difficulty passing urine signs and symptoms of liver injury like dark yellow or brown urine; general ill feeling or flu-like symptoms; light-colored stools; loss of appetite; nausea; right upper belly pain; unusually weak or tired; yellowing of the eyes or skin swelling of the ankles, feet,  hands unusually slow heartbeat Side effects that usually do not require medical attention (report to your doctor or health care professional if they continue or are bothersome): diarrhea hair loss loss of appetite muscle or joint pain nausea, vomiting pain, redness, or irritation at site where injected tiredness This list may not describe all possible side effects. Call your doctor for medical advice about side effects. You may report side effects to FDA at 1-800-FDA-1088. Where should I keep my medication? This drug is given in a hospital or clinic and will not be stored at home. NOTE: This sheet is a summary. It may not cover all possible information. If you have questions about this medicine, talk to your doctor, pharmacist, or health care  provider.  2022 Elsevier/Gold Standard (2020-09-21 00:00:00)

## 2021-02-02 NOTE — Progress Notes (Signed)
Pt in for follow up and treatment today.  States seen in symptom management yesterday for nausea. Still having nausea today.

## 2021-02-02 NOTE — Progress Notes (Deleted)
The patient did not show up to her initial appointment on 02/03/2021 despite the fact that she had been called the day before by Juliann Pulse and she told her that she would be coming in.

## 2021-02-02 NOTE — Progress Notes (Signed)
[facility] at [facility] [phone] [phone]   Name: Vanessa Romero Date: 02/02/2021 MRN: [id number]  DOB: 30-Dec-1975  Patient Care Team: Anselmo Pickler, MD as PCP - General (Internal Medicine)    REASON FOR CONSULTATION: Vanessa Romero is a 46 y.o. 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  Patient was referred to palliative care to help address goals and manage ongoing symptoms.  SOCIAL HISTORY:     reports that she has quit smoking. Her smoking use included cigarettes. She has quit using smokeless tobacco. She reports that she does not currently use alcohol. She reports that she does not currently use drugs.  Patient is married lives at home with her husband.  She has 2 daughters who live nearby.  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  ADVANCE DIRECTIVES:  Not on file  CODE STATUS:   PAST MEDICAL HISTORY: Past Medical History:  Diagnosis Date   Alcohol abuse    Anemia    Cancer ([place])    Tobacco dependence     PAST SURGICAL HISTORY:  Past Surgical History:  Procedure Laterality Date   ABDOMINAL ADHESION SURGERY     bowel obstruction     x2   CERVICAL CONIZATION W/BX N/A 11/26/2020   Procedure: CONIZATION CERVIX WITH BIOPSY;  Surgeon: Malachy Mood, MD;  Location: [facility] ORS;  Service: Gynecology;  Laterality: N/A;   ESOPHAGOGASTRODUODENOSCOPY N/A 11/24/2020   Procedure: ESOPHAGOGASTRODUODENOSCOPY (EGD);  Surgeon:  Lin Landsman, MD;  Location: [facility] ENDOSCOPY;  Service: Gastroenterology;  Laterality: N/A;   laparoscopic knee surgery     PORTA CATH INSERTION N/A 12/20/2020   Procedure: PORTA CATH INSERTION;  Surgeon: Algernon Huxley, MD;  Location: [facility];  Service: Cardiovascular;  Laterality: N/A;   ROUX-EN-Y GASTRIC BYPASS     TEAR DUCT PROBING     unclogg   VAGOTOMY     VENTRICULOPERITONEAL SHUNT     x6 put in and removals    HEMATOLOGY/ONCOLOGY HISTORY:  Oncology History  Cervical cancer, FIGO stage IIB ([place])  12/06/2020 Initial Diagnosis   Cervical cancer, FIGO stage IIB ([place])   12/06/2020 Cancer Staging   Staging form: Cervix Uteri, AJCC Version 9 - Clinical stage from 12/06/2020: FIGO Stage IIB (cT2b, cN0, cM0) - Signed by Lloyd Huger, MD on 12/06/2020 Stage prefix: Initial diagnosis    12/21/2020 -  Chemotherapy   Patient is on Treatment Plan : CERVICAL Carboplatin/PACLitaxel weekly x 6 weeks with XRT         ALLERGIES:  is allergic to contrast media [iodinated contrast media], gabapentin, morphine, sumatriptan, zolpidem, erythromycin, valproic acid, acetazolamide, erythromycin base, amoxicillin, and divalproex sodium.  MEDICATIONS:  Current Outpatient Medications  Medication Sig Dispense Refill   benzonatate (TESSALON) 200 MG capsule Take 1 capsule (200 mg total) by mouth 3 (three) times daily as needed for cough. 30 capsule 0   cholecalciferol (VITAMIN D) 25 MCG tablet Take 1 tablet (1,000 Units total) by mouth daily. 30 tablet 1   cyanocobalamin 1000 MCG tablet Take 1 tablet (1,000 mcg total) by mouth daily. 30 tablet 0  escitalopram (LEXAPRO) 20 MG tablet Take 1 tablet (20 mg total) by mouth daily at 12 noon. 30 tablet 1   feeding supplement (ENSURE ENLIVE / ENSURE PLUS) LIQD Take 237 mLs by mouth 3 (three) times daily between meals. 21330 mL 0   fentaNYL (DURAGESIC) 25 MCG/HR Place 1 patch onto the skin every 3 (three) days. 5 patch 0   fidaxomicin  (DIFICID) 200 MG TABS tablet Take 1 tablet (200 mg total) by mouth 2 (two) times daily. 15 tablet 0   fluticasone (FLONASE) 50 MCG/ACT nasal spray Place 2 sprays into both nostrils daily. 16 g 0   folic acid (FOLVITE) 1 MG tablet TAKE ONE TABLET BY MOUTH DAILY 90 tablet 2   lamoTRIgine (LAMICTAL) 25 MG tablet Take 2 tablets (50 mg total) by mouth daily. 60 tablet 1   lidocaine-prilocaine (EMLA) cream Apply to affected area once 30 g 3   Multiple Vitamin (MULTIVITAMIN WITH MINERALS) TABS tablet Take 1 tablet by mouth daily.     naloxone (NARCAN) nasal spray 4 mg/0.1 mL SPRAY 1 SPRAY INTO ONE NOSTRIL AS DIRECTED FOR OPIOID OVERDOSE (TURN PERSON ON SIDE AFTER DOSE. IF NO RESPONSE IN 2-3 MINUTES OR PERSON RESPONDS BUT RELAPSES, REPEAT USING A NEW SPRAY DEVICE AND SPRAY INTO THE OTHER NOSTRIL. CALL 911 AFTER USE.) * EMERGENCY USE ONLY * 1 each 0   ondansetron (ZOFRAN) 8 MG tablet Take 1 tablet (8 mg total) by mouth 2 (two) times daily as needed for refractory nausea / vomiting. 60 tablet 1   prochlorperazine (COMPAZINE) 10 MG tablet Take 1 tablet (10 mg total) by mouth every 6 (six) hours as needed (Nausea or vomiting). 30 tablet 0   sucralfate (CARAFATE) 1 g tablet Take 1 tablet (1 g total) by mouth 3 (three) times daily. 90 tablet 1   thiamine 100 MG tablet Take 1 tablet (100 mg total) by mouth daily. 30 tablet 0   No current facility-administered medications for this visit.   Facility-Administered Medications Ordered in Other Visits  Medication Dose Route Frequency Provider Last Rate Last Admin   heparin lock flush 100 UNIT/ML injection             VITAL SIGNS: LMP 02/01/2021  There were no vitals filed for this visit.  Estimated body mass index is 18.92 kg/m as calculated from the following:   Height as of 01/23/21: 5\' 7"  (1.702 m).   Weight as of 01/23/21: 120 lb 13 oz (54.8 kg).  LABS: CBC:    Component Value Date/Time   WBC 3.9 (L) 02/01/2021 0942   HGB 11.4 (L) 02/01/2021 0942   HCT  35.3 (L) 02/01/2021 0942   PLT 199 02/01/2021 0942   MCV 82.9 02/01/2021 0942   NEUTROABS 2.4 02/01/2021 0942   LYMPHSABS 0.9 02/01/2021 0942   MONOABS 0.4 02/01/2021 0942   EOSABS 0.2 02/01/2021 0942   BASOSABS 0.0 02/01/2021 0942   Comprehensive Metabolic Panel:    Component Value Date/Time   NA 134 (L) 02/01/2021 0942   K 3.4 (L) 02/01/2021 0942   CL 103 02/01/2021 0942   CO2 22 02/01/2021 0942   BUN 16 02/01/2021 0942   CREATININE 0.56 02/01/2021 0942   GLUCOSE 100 (H) 02/01/2021 0942   CALCIUM 8.7 (L) 02/01/2021 0942   AST 22 02/01/2021 0942   ALT 15 02/01/2021 0942   ALKPHOS 47 02/01/2021 0942   BILITOT 0.2 (L) 02/01/2021 0942   PROT 6.5 02/01/2021 0942   ALBUMIN 3.5 02/01/2021 0942    RADIOGRAPHIC  STUDIES: CT ABDOMEN PELVIS WO CONTRAST  Result Date: 01/22/2021 CLINICAL DATA:  Right-sided abdominal pain. EXAM: CT ABDOMEN AND PELVIS WITHOUT CONTRAST TECHNIQUE: Multidetector CT imaging of the abdomen and pelvis was performed following the standard protocol without IV contrast. COMPARISON:  January 19, 2021 FINDINGS: Lower chest: No acute abnormality. Hepatobiliary: No focal liver abnormality is seen. Status post cholecystectomy. The common bile duct measures 1.2 cm in diameter. Pancreas: Unremarkable. No pancreatic ductal dilatation or surrounding inflammatory changes. Spleen: Normal in size without focal abnormality. Adrenals/Urinary Tract: Adrenal glands are unremarkable. Kidneys are normal, without renal calculi, focal lesion, or hydronephrosis. The urinary bladder is poorly distended and subsequently limited in evaluation. Stomach/Bowel: Surgical sutures are seen within the gastric region. Surgically anastomosed bowel is also noted within the mid left abdomen. The appendix is not clearly identified. Stool is seen throughout the large bowel. Moderately inflamed small bowel loops are suspected within the pelvis. It should be noted that this is limited in evaluation in the absence  of oral contrast. A short segment of dilated small bowel is also suspected within the lower pelvis on the left (axial CT image 67 through 74, CT series 2). Vascular/Lymphatic: Very mild aortic atherosclerosis. No enlarged abdominal or pelvic lymph nodes. Reproductive: Uterus is mildly enlarged and heterogeneous in appearance. A 2.1 cm diameter cyst is noted along the posterior aspect of the right adnexa. Other: No abdominal wall hernia or abnormality. No abdominopelvic ascites. Musculoskeletal: No acute or significant osseous findings. IMPRESSION: 1. Moderate severity enteritis involving multiple loops of distal small bowel with additional findings that may represent a subsequent distal small bowel obstruction. 2. Evidence of prior gastric bypass surgery. 3. Evidence of prior cholecystectomy. 4. 2.1 cm diameter right adnexal cyst, likely ovarian in origin. 5. Very mild aortic atherosclerosis. Aortic Atherosclerosis (ICD10-I70.0). Electronically Signed   By: Virgina Norfolk M.D.   On: 01/22/2021 01:44   CT Abdomen Pelvis Wo Contrast  Result Date: 01/19/2021 CLINICAL DATA:  Abdominal pain. Diarrhea, nausea, and vomiting. Current treatment for Clostridium difficile colitis. EXAM: CT ABDOMEN AND PELVIS WITHOUT CONTRAST TECHNIQUE: Multidetector CT imaging of the abdomen and pelvis was performed following the standard protocol without IV contrast. COMPARISON:  CT abdomen and pelvis 01/09/2021 FINDINGS: Lower chest: Clear lung bases. Hepatobiliary: No focal liver abnormality is seen. Status post cholecystectomy with mildly increased extrahepatic biliary dilatation compared to the prior study with the common bile duct measuring approximately 1.1 cm in diameter proximally and tapering distally. Pancreas: Unremarkable. Spleen: Small calcification in the central aspect of the spleen. Adrenals/Urinary Tract: Unremarkable adrenal glands. No evidence of renal mass, calculi, or hydronephrosis. Nondistended bladder.  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 Bowel assessment is limited by the absence of IV and oral contrast material. There is no evidence of bowel obstruction. Distal small bowel and distal colonic inflammation on the prior CT appears improved. There is a moderate amount of right-sided colonic stool. Vascular/Lymphatic: Mild abdominal aortic atherosclerosis without aneurysm. No enlarged lymph nodes. Reproductive: Grossly unremarkable uterus and adnexa. Other: At most trace pelvic free fluid.  No pneumoperitoneum. Musculoskeletal: No acute osseous abnormality or suspicious osseous lesion. IMPRESSION: 1. Improved distal small bowel and colonic inflammation. No evidence of bowel obstruction. 2. Mildly increased extrahepatic biliary dilatation. Recommend laboratory correlation. 3. Aortic Atherosclerosis (ICD10-I70.0). Electronically Signed   By: Logan Bores M.D.   On: 01/19/2021 10:29   CT ABDOMEN PELVIS WO CONTRAST  Result Date: 01/09/2021 CLINICAL DATA:  46 year old female with increasing abdominal pain. Distal  colitis on CT several days ago. Cervical cancer. Positive labs for for C difficile on 01/05/2021. EXAM: CT ABDOMEN AND PELVIS WITHOUT CONTRAST TECHNIQUE: Multidetector CT imaging of the abdomen and pelvis was performed following the standard protocol without IV contrast. COMPARISON:  CT Abdomen and Pelvis 01/06/2021. FINDINGS: Lower chest: Negative. Hepatobiliary: Absent gallbladder. Negative noncontrast liver aside from trace inferior perihepatic free fluid with simple fluid density on series 2, image 43. Pancreas: Negative noncontrast pancreas. Spleen: Negative. Adrenals/Urinary Tract: Normal adrenal glands. Noncontrast kidneys appear stable and nonobstructed. Bladder seems to remain normal. Incidental pelvic phleboliths. Stomach/Bowel: Previous Roux-en-Y type gastric bypass. No dilated small or large bowel loops, but distal small bowel in the anterior pelvis appears  severely inflamed (series 2, image 67) with pronounced wall thickening bowel wall thickening and edema continuing to the terminal ileum and ileocecal valve (image 59). Right: And transverse colon appear within normal limits. Splenic flexure within normal limits. Gradual transition in the distal descending colon 2 inflamed sigmoid and rectum with circumferential wall thickening. Oval roughly 10 mm capsule appears impacted at the gastroesophageal junction on series 2, image 13, but there is no upstream esophageal dilatation evident. Bypassed portion of the stomach contains a small volume of fluid as before. Small volume of fluid also in the duodenum. No free air. There is trace free fluid in the abdomen. Vascular/Lymphatic: Mild Calcified aortic atherosclerosis. Normal caliber abdominal aorta. Vascular patency is not evaluated in the absence of IV contrast. Reproductive: Retroverted uterus as before. Otherwise negative noncontrast appearance. Other: Pelvic mesenteric edema but no definite pelvic free fluid. Musculoskeletal: No acute osseous abnormality identified. IMPRESSION: 1. Progressive and now severe inflammation of distal small bowel in the pelvis, including the terminal ileum. And ongoing distal colitis. Suspect Progressive Clostridium Difficile Enterocolitis in this setting. No pneumoperitoneum or pneumatosis identified. Trace free fluid. 2. Suspect impacted tablet or capsule at the GEJ. But no evidence of associated esophageal obstruction. 3. No other acute or inflammatory process identified on noncontrast CT abdomen and pelvis. Electronically Signed   By: Genevie Ann M.D.   On: 01/09/2021 10:54   CT ABDOMEN PELVIS W CONTRAST  Result Date: 01/06/2021 CLINICAL DATA:  Abdominal cramping and diarrhea. EXAM: CT ABDOMEN AND PELVIS WITH CONTRAST TECHNIQUE: Multidetector CT imaging of the abdomen and pelvis was performed using the standard protocol following bolus administration of intravenous contrast. CONTRAST:   10mL OMNIPAQUE IOHEXOL 300 MG/ML  SOLN COMPARISON:  None. FINDINGS: Lower chest: No acute abnormality. Hepatobiliary: No focal liver abnormality is seen. Status post cholecystectomy. Common bile duct is dilated and measures 1.4 cm. Pancreas: Unremarkable. No pancreatic ductal dilatation or surrounding inflammatory changes. Spleen: Normal in size without focal abnormality. Adrenals/Urinary Tract: Adrenal glands are unremarkable. Kidneys are normal, without renal calculi, focal lesion, or hydronephrosis. The urinary bladder is partially empty and subsequently limited in evaluation. Stomach/Bowel: Surgical sutures are seen within the gastric region. Appendix appears normal. No evidence of bowel dilatation. Mild to moderate severity diffuse colonic wall thickening is seen throughout the descending and sigmoid colon. Vascular/Lymphatic: Very mild aortic atherosclerosis. No enlarged abdominal or pelvic lymph nodes. Reproductive: The uterus is unremarkable. A 2.1 cm diameter cyst is seen along the posterior aspect of the right adnexa. Other: No abdominal wall hernia or abnormality. No abdominopelvic ascites. Musculoskeletal: No acute or significant osseous findings. IMPRESSION: 1. Mild to moderate severity infectious or inflammatory colitis involving the descending and sigmoid colon. 2. Evidence of prior cholecystectomy. 3. 2.1 cm diameter right adnexal cyst, likely ovarian in  origin. 4. Very mild aortic atherosclerosis. Aortic Atherosclerosis (ICD10-I70.0). Electronically Signed   By: Virgina Norfolk M.D.   On: 01/06/2021 19:52   DG Abd 2 Views  Result Date: 02/01/2021 CLINICAL DATA:  46 year old female with right lower quadrant pain, nausea vomiting diarrhea for 2 days. Prior gastric bypass. EXAM: ABDOMEN - 2 VIEW COMPARISON:  CT Abdomen and Pelvis 01/22/2021 and earlier. FINDINGS: Upright and supine views of the abdomen and pelvis. Non obstructed bowel gas pattern. Negative lung bases. No pneumoperitoneum. Left  upper quadrant, left mid abdomen, and right upper quadrant postoperative changes with clips and staple lines. Incidental pelvic phleboliths. Partially visible mediastinal vascular catheter at the cavoatrial junction. No acute osseous abnormality identified. IMPRESSION: Normal bowel gas pattern, no free air. Prior gastric bypass and cholecystectomy. Electronically Signed   By: Genevie Ann M.D.   On: 02/01/2021 11:15   DG Abd 2 Views  Result Date: 01/05/2021 CLINICAL DATA:  46 year old female with abdominal pain. Recent diarrhea and constipation. Cervical cancer EXAM: ABDOMEN - 2 VIEW COMPARISON:  PET-CT 12/06/2020. FINDINGS: Upright and supine views of the abdomen and pelvis. Negative lung bases. No pneumoperitoneum. Stable cholecystectomy clips. Non obstructed bowel gas pattern. Moderate volume of retained stool in the colon. Recent PET-CT with Roux-en-Y type gastric bypass. No acute osseous abnormality identified. Pelvic phleboliths. IMPRESSION: Nonobstructed bowel-gas pattern with moderate volume of retained stool. Prior gastric bypass. Electronically Signed   By: Genevie Ann M.D.   On: 01/05/2021 11:24   DG ABD ACUTE 2+V W 1V CHEST  Result Date: 01/11/2021 CLINICAL DATA:  Right-sided abdominal pain. EXAM: DG ABDOMEN ACUTE WITH 1 VIEW CHEST COMPARISON:  January 05, 2021 FINDINGS: There is no evidence of dilated bowel loops or free intraperitoneal air. No radiopaque calculi or other significant radiographic abnormality is seen. Radiopaque surgical clips are seen within the right upper quadrant, with radiopaque surgical sutures noted along the medial aspect of the left upper quadrant. Subcentimeter phleboliths are noted within the lower pelvis. A right-sided venous Port-A-Cath is seen with its distal tip noted at the junction of the superior vena cava and right atrium. Heart size and mediastinal contours are within normal limits. Both lungs are clear. IMPRESSION: Negative abdominal radiographs.  No acute  cardiopulmonary disease. Electronically Signed   By: Virgina Norfolk M.D.   On: 01/11/2021 04:08    PERFORMANCE STATUS (ECOG) : 1 - Symptomatic but completely ambulatory  Review of Systems Unless otherwise noted, a complete review of systems is negative.  Physical Exam General: NAD Pulmonary: Unlabored Extremities: no edema, no joint deformities Skin: no rashes Neurological: Weakness but otherwise nonfocal  IMPRESSION: Patient seen following her visit with Dr. Grayland Ormond.  Patient reports feeling much improved today.  She still has occasional nausea but says that she is tolerating food/drink without vomiting.  She took some Mylanta and says that that helped.  Diarrhea has resolved.  Patient denies other symptomatic complaints.  She reports stable pain on fentanyl with occasional use of Norco for breakthrough pain.  Anxiety is stable on alprazolam.  Patient is pending evaluation by interventional pain management for consideration of nerve block.  Patient says she is interested in that to see if she could take fewer pain medications.  PDMP reviewed.  PLAN: -Continue current scope of treatment -Refill fentanyl #5 patches -Refill Norco #60 -Refill alprazolam #30 -Patient pending evaluation by interventional pain management -Follow-up telephone visit 1 month  Case and plan discussed with Dr. Grayland Ormond  Patient expressed understanding and was in agreement with  this plan. She also understands that She can call the clinic at any time with any questions, concerns, or complaints.     Time Total: 15 minutes  Visit consisted of counseling and education dealing with the complex and emotionally intense issues of symptom management and palliative care in the setting of serious and potentially life-threatening illness.Greater than 50%  of this time was spent counseling and coordinating care related to the above assessment and plan.  Signed by: Altha Harm, PhD, NP-C

## 2021-02-03 ENCOUNTER — Encounter: Payer: Self-pay | Admitting: Oncology

## 2021-02-03 ENCOUNTER — Ambulatory Visit
Admission: RE | Admit: 2021-02-03 | Discharge: 2021-02-03 | Disposition: A | Discharge status: Home / Self Care | Payer: Medicare Other | Source: Ambulatory Visit | Attending: Radiation Oncology | Admitting: Radiation Oncology

## 2021-02-03 ENCOUNTER — Ambulatory Visit: Payer: Medicare Other | Admitting: Pain Medicine

## 2021-02-03 DIAGNOSIS — Z789 Other specified health status: Secondary | ICD-10-CM

## 2021-02-03 DIAGNOSIS — Z79891 Long term (current) use of opiate analgesic: Secondary | ICD-10-CM

## 2021-02-03 DIAGNOSIS — Z79899 Other long term (current) drug therapy: Secondary | ICD-10-CM

## 2021-02-03 DIAGNOSIS — M899 Disorder of bone, unspecified: Secondary | ICD-10-CM

## 2021-02-03 DIAGNOSIS — Z5111 Encounter for antineoplastic chemotherapy: Secondary | ICD-10-CM | POA: Diagnosis not present

## 2021-02-03 DIAGNOSIS — G894 Chronic pain syndrome: Secondary | ICD-10-CM

## 2021-02-04 ENCOUNTER — Ambulatory Visit: Payer: Medicare Other

## 2021-02-04 ENCOUNTER — Inpatient Hospital Stay: Payer: Medicare Other

## 2021-02-04 ENCOUNTER — Other Ambulatory Visit: Payer: Self-pay

## 2021-02-04 ENCOUNTER — Emergency Department
Admission: EM | Admit: 2021-02-04 | Discharge: 2021-02-04 | Disposition: A | Discharge status: Home / Self Care | Payer: Medicare Other | Attending: Emergency Medicine | Admitting: Emergency Medicine

## 2021-02-04 ENCOUNTER — Emergency Department: Payer: Medicare Other

## 2021-02-04 DIAGNOSIS — R1011 Right upper quadrant pain: Secondary | ICD-10-CM | POA: Diagnosis present

## 2021-02-04 DIAGNOSIS — Z8616 Personal history of COVID-19: Secondary | ICD-10-CM | POA: Diagnosis not present

## 2021-02-04 DIAGNOSIS — Z8541 Personal history of malignant neoplasm of cervix uteri: Secondary | ICD-10-CM | POA: Diagnosis not present

## 2021-02-04 DIAGNOSIS — R112 Nausea with vomiting, unspecified: Secondary | ICD-10-CM | POA: Diagnosis not present

## 2021-02-04 DIAGNOSIS — R197 Diarrhea, unspecified: Secondary | ICD-10-CM | POA: Diagnosis not present

## 2021-02-04 LAB — CBC WITH DIFFERENTIAL/PLATELET
Abs Immature Granulocytes: 0.01 10*3/uL (ref 0.00–0.07)
Basophils Absolute: 0 10*3/uL (ref 0.0–0.1)
Basophils Relative: 1 %
Eosinophils Absolute: 0.2 10*3/uL (ref 0.0–0.5)
Eosinophils Relative: 5 %
HCT: 40.7 % (ref 36.0–46.0)
Hemoglobin: 13 g/dL (ref 12.0–15.0)
Immature Granulocytes: 0 %
Lymphocytes Relative: 21 %
Lymphs Abs: 0.9 10*3/uL (ref 0.7–4.0)
MCH: 27.4 pg (ref 26.0–34.0)
MCHC: 31.9 g/dL (ref 30.0–36.0)
MCV: 85.9 fL (ref 80.0–100.0)
Monocytes Absolute: 0.1 10*3/uL (ref 0.1–1.0)
Monocytes Relative: 3 %
Neutro Abs: 3 10*3/uL (ref 1.7–7.7)
Neutrophils Relative %: 70 %
Platelets: 176 10*3/uL (ref 150–400)
RBC: 4.74 MIL/uL (ref 3.87–5.11)
RDW: 21.1 % — ABNORMAL HIGH (ref 11.5–15.5)
Smear Review: NORMAL
WBC: 4.3 10*3/uL (ref 4.0–10.5)
nRBC: 0 % (ref 0.0–0.2)

## 2021-02-04 LAB — COMPREHENSIVE METABOLIC PANEL
ALT: 19 U/L (ref 0–44)
AST: 30 U/L (ref 15–41)
Albumin: 4.5 g/dL (ref 3.5–5.0)
Alkaline Phosphatase: 51 U/L (ref 38–126)
Anion gap: 11 (ref 5–15)
BUN: 15 mg/dL (ref 6–20)
CO2: 25 mmol/L (ref 22–32)
Calcium: 9.1 mg/dL (ref 8.9–10.3)
Chloride: 102 mmol/L (ref 98–111)
Creatinine, Ser: 0.69 mg/dL (ref 0.44–1.00)
GFR, Estimated: >60 mL/min (ref >60)
Glucose, Bld: 117 mg/dL — ABNORMAL HIGH (ref 70–99)
Potassium: 3.7 mmol/L (ref 3.5–5.1)
Sodium: 138 mmol/L (ref 135–145)
Total Bilirubin: 0.2 mg/dL — ABNORMAL LOW (ref 0.3–1.2)
Total Protein: 7.6 g/dL (ref 6.5–8.1)

## 2021-02-04 LAB — URINALYSIS, ROUTINE W REFLEX MICROSCOPIC
Bacteria, UA: NONE SEEN
Glucose, UA: NEGATIVE mg/dL
Hgb urine dipstick: NEGATIVE
Ketones, ur: 5 mg/dL — AB
Leukocytes,Ua: NEGATIVE
Nitrite: NEGATIVE
Protein, ur: 30 mg/dL — AB
Specific Gravity, Urine: 1.04 — ABNORMAL HIGH (ref 1.005–1.030)
pH: 5 (ref 5.0–8.0)

## 2021-02-04 LAB — LIPASE, BLOOD: Lipase: 42 U/L (ref 11–51)

## 2021-02-04 LAB — POC URINE PREG, ED: Preg Test, Ur: NEGATIVE

## 2021-02-04 LAB — LACTIC ACID, PLASMA: Lactic Acid, Venous: 1.2 mmol/L (ref 0.5–1.9)

## 2021-02-04 MED ORDER — HYDROMORPHONE HCL 1 MG/ML IJ SOLN
0.5000 mg | Freq: Once | INTRAMUSCULAR | Status: AC
  Administered 2021-02-04: 0.5 mg via INTRAVENOUS
  Filled 2021-02-04: qty 1

## 2021-02-04 MED ORDER — PROCHLORPERAZINE EDISYLATE 10 MG/2ML IJ SOLN
10.0000 mg | Freq: Once | INTRAMUSCULAR | Status: AC
  Administered 2021-02-04: 10 mg via INTRAVENOUS
  Filled 2021-02-04: qty 2

## 2021-02-04 MED ORDER — PROCHLORPERAZINE MALEATE 10 MG PO TABS: 10.0000 mg | ORAL_TABLET | Freq: Four times a day (QID) | ORAL | 0 refills | Status: DC | PRN

## 2021-02-04 MED ORDER — MAGNESIUM HYDROXIDE 400 MG/5ML PO SUSP
960.0000 mL | Freq: Once | ORAL | Status: DC
  Filled 2021-02-04 (×2): qty 473

## 2021-02-04 MED ORDER — OXYCODONE-ACETAMINOPHEN 7.5-325 MG PO TABS: 1.0000 | ORAL_TABLET | ORAL | 0 refills | Status: AC | PRN

## 2021-02-04 NOTE — ED Provider Notes (Signed)
[facility] Provider Note    Event Date/Time   First MD Initiated Contact with Patient 02/04/21 [phone]     (approximate)   History   Abdominal Pain   HPI  Arlyss Weathersby is a 46 y.o. female who is currently getting chemotherapy for cervical cancer.  She has a history of alcoholism and chronic pain, gastric bypass, COVID-19 gastroenteritis stage IIb cervical cancer seizure disorder anxiety and depression and B12 deficiency and vitamin D deficiency with anemia and tobacco dependence in the past.  And a recent history of C. difficile with bowel obstruction and enteritis.  Patient reports she is having diarrhea nausea and vomiting and right upper quadrant pain exactly the same as when she had her C. difficile with enteritis with small bowel obstruction earlier this month.  She completed all her antibiotics and other medications and the symptoms have returned.     Physical Exam   Triage Vital Signs: ED Triage Vitals  Enc Vitals Group     BP 02/04/21 0537 130/80     Pulse Rate 02/04/21 0537 94     Resp 02/04/21 0537 19     Temp 02/04/21 0537 97.7 F (36.5 C)     Temp Source 02/04/21 0537 Oral     SpO2 02/04/21 0537 99 %     Weight 02/04/21 0539 127 lb (57.6 kg)     Height 02/04/21 0539 5\' 7"  (1.702 m)     Head Circumference --      Peak Flow --      Pain Score 02/04/21 0539 7     Pain Loc --      Pain Edu? --      Excl. in [place]? --     Most recent vital signs: Vitals:   02/04/21 1600 02/04/21 1615  BP: 107/60   Pulse: 73 73  Resp: 19 14  Temp:    SpO2: 100% 100%     General: Awake, pale does not look comfortable CV:  Good peripheral perfusion.  Heart regular rate and rhythm no audible murmur Resp:  Normal effort.  Lungs are clear Abd:  Slight distention bowel sounds are positive there is tenderness to palpation percussion on the right side the belly especially in the right upper quadrant Other:  Slight trace edema in the legs   ED Results /  Procedures / Treatments   Labs (all labs ordered are listed, but only abnormal results are displayed) Labs Reviewed  COMPREHENSIVE METABOLIC PANEL - Abnormal; Notable for the following components:      Result Value   Glucose, Bld 117 (*)    Total Bilirubin 0.2 (*)    All other components within normal limits  CBC WITH DIFFERENTIAL/PLATELET - Abnormal; Notable for the following components:   RDW 21.1 (*)    All other components within normal limits  URINALYSIS, ROUTINE W REFLEX MICROSCOPIC - Abnormal; Notable for the following components:   Color, Urine AMBER (*)    APPearance HAZY (*)    Specific Gravity, Urine 1.040 (*)    Bilirubin Urine MODERATE (*)    Ketones, ur 5 (*)    Protein, ur 30 (*)    All other components within normal limits  POC URINE PREG, ED - Normal  CULTURE, BLOOD (ROUTINE X 2)  CULTURE, BLOOD (ROUTINE X 2)  GASTROINTESTINAL PANEL BY PCR, STOOL (REPLACES STOOL CULTURE)  C DIFFICILE QUICK SCREEN W PCR REFLEX    LACTIC ACID, PLASMA  LIPASE, BLOOD     EKG  RADIOLOGY CT read by radiology reviewed by me shows a lot of fluid proximally.  Radiology feels this could be a partial small bowel obstruction or gastric gastric fistula something related to her gastric bypass or could be normal.  I cannot add anything onto that interpretation and do not disagree with that whatsoever   PROCEDURES:  Critical Care performed:   Procedures   MEDICATIONS ORDERED IN ED: Medications  sorbitol, milk of mag, mineral oil, glycerin (SMOG) enema (has no administration in time range)  HYDROmorphone (DILAUDID) injection 0.5 mg (0.5 mg Intravenous Given 02/04/21 1032)  prochlorperazine (COMPAZINE) injection 10 mg (10 mg Intravenous Given 02/04/21 1031)  HYDROmorphone (DILAUDID) injection 0.5 mg (0.5 mg Intravenous Given 02/04/21 1156)     IMPRESSION / MDM / ASSESSMENT AND PLAN / ED COURSE  I reviewed the triage vital signs and the nursing notes.                   Patient  asked for pain medicine and is given 2 doses of 0.5 mg of Dilaudid.  After this she is sleepy but occasionally will wake up and asked for more pain medicine.  Wound I have not given her any more she then goes back to sleep.  I discussed the patient with Dr. Adora Fridge on-call for surgery.  He says she should be transferred out as he does not have anything to do with gastric bypass surgery.  I informed the patient of this.  We are attempting to contact [facility].  Patient's doctor that did the surgery in 2011 at [facility] was [employer].  He apparently is still associated with [facility] but is now living in [place].  We are attempting to contact [facility].           According to the radiologist in the CT report this could be a gastro gastric fistula or normal or as partial small bowel obstruction or some other complication of her gastric bypass. The patient is on the cardiac monitor to evaluate for evidence of arrhythmia and/or significant heart rate changes.  Patient's lab work including CBC and differential is normal.  Patient's electrolytes and liver functions are normal.  Patient's lipase is normal.  Patient does have 6-10 WBCs in her urine but no bacteria.  Her specific gravity is high at 1.04.  Pregnancy test is negative.  I am giving her some fluids.  We will try to contact [facility] as I mention. ----------------------------------------- 3:58 PM on 02/04/2021 ----------------------------------------- I just spoke with the nurse practitioner for the bariatric service at [facility].  She tells me she will reach out to her provider and get back to [place] ASAP. ----------------------------------------- 4:45 PM on 02/04/2021 ----------------------------------------- I discussed the patient with Dr. Haynes Bast at [facility] who is covering the bariatric service.  He reviewed the report and does not think that this is anything to do with the gastric bypass.  I then discussed the patient with the hospitalist who thinks that this is possibly a  surgical problem and does not want to admit it.  I called Dr. Perrin Maltese back.  He will come down and examined the patient and give me an opinion.  He thinks it is possible we might get a get this patient follow-up outpatient.  The patient had some stool here in the hospital.  It is very soft but it is not liquidy.  It is not the kind of stool that they will run any kind of stool specimen on.  Patient does have a  few white cells in her urine but no bacteria.  Her nitrite and leukocyte Estrace are both negative.  She is not complaining of any dysuria only the right upper quadrant pain.  ----------------------------------------- 5:05 PM on 02/04/2021 ----------------------------------------- Dr. Perrin Maltese has come down to see the patient. He does not think the patient has a surgical problem.  We will give the patient an enema for her apparent constipation.  Her stool again was not diarrhea which is soft.  The whole colon however on the CT scan looks like there is a lot of stool in it.  This may be contributing to her discomfort.  Patient reports she has hydrocodone at home but is not helping so we will give her some oxycodone.  Dr. Adora Fridge thinks it would be wise for the patient to follow-up with her bariatric surgeon.  This also sounds like a good idea.  Dr. Haynes Bast reports she knows her very well.  She has seen them frequently.  Patient will return for fever or worsening pain or further vomiting etc.   FINAL CLINICAL IMPRESSION(S) / ED DIAGNOSES   Final diagnoses:  Right upper quadrant abdominal pain     Rx / DC Orders   ED Discharge Orders          Ordered    oxyCODONE-acetaminophen (PERCOCET) 7.5-325 MG tablet  Every 4 hours PRN        02/04/21 1702             Note:  This document was prepared using Dragon voice recognition software and may include unintentional dictation errors.   Nena Polio, MD 02/04/21 [phone]

## 2021-02-04 NOTE — Consult Note (Signed)
Patient ID: Vanessa Romero, female   DOB: 06-Jan-1976, 46 y.o.   MRN: [id number]  HPI Vanessa Romero is a 46 y.o. female asked to see in consultation for abdominal pain.  She does have a complex abdominal surgical history to include gastric bypass about 12 years ago at [facility] in [place], [place].  Apparently she did have a bowel obstruction requiring exploratory laparotomy.  History of vagotomy. She has been having this abdominal pain intermittently for over a month.  He has had 3 ER visits within the last month and this is her fifth CT scan of the abdomen pelvis within a month. Continues to endorse intermittent abdominal pain that is crampy and mainly on the right side.  She does have watery bowel movements and was recently diagnosed with C. difficile. She does take chronic narcotics at home as well as Phenergan and SSRIs SHe did have a CT that have personally reviewed showing some dilation of the hepatobiliary afferent limb, no evidence of internal hernias no evidence of free air or pneumatosis. Dr. Gustavus Messing is covering for bariatric surgery at [facility] review the CT scan findings with Dr. Cinda Quest. Dr/ Pilati did not think that the findings were concerning for hospitalization or admission related to the gastric bypass. Does have an appointment with GI in the next month or so. CBC and CMP is completely normal. HPI  Past Medical History:  Diagnosis Date   Alcohol abuse    Anemia    Cancer ([place])    Tobacco dependence     Past Surgical History:  Procedure Laterality Date   ABDOMINAL ADHESION SURGERY     bowel obstruction     x2   CERVICAL CONIZATION W/BX N/A 11/26/2020   Procedure: CONIZATION CERVIX WITH BIOPSY;  Surgeon: Malachy Mood, MD;  Location: [facility] ORS;  Service: Gynecology;  Laterality: N/A;   ESOPHAGOGASTRODUODENOSCOPY N/A 11/24/2020   Procedure: ESOPHAGOGASTRODUODENOSCOPY (EGD);  Surgeon: Lin Landsman, MD;  Location: [facility] ENDOSCOPY;  Service: Gastroenterology;  Laterality: N/A;    laparoscopic knee surgery     PORTA CATH INSERTION N/A 12/20/2020   Procedure: PORTA CATH INSERTION;  Surgeon: Algernon Huxley, MD;  Location: [facility] CV LAB;  Service: Cardiovascular;  Laterality: N/A;   ROUX-EN-Y GASTRIC BYPASS     TEAR DUCT PROBING     unclogg   VAGOTOMY     VENTRICULOPERITONEAL SHUNT     x6 put in and removals    Family History  Problem Relation Age of Onset   Cancer Mother    Cancer Father    Cancer Maternal Grandmother     Social History Social History   Tobacco Use   Smoking status: Former    Types: Cigarettes   Smokeless tobacco: Former  Scientific laboratory technician Use: Former  Substance Use Topics   Alcohol use: Not Currently   Drug use: Not Currently    Allergies  Allergen Reactions   Contrast Media [Iodinated Contrast Media] Hives   Gabapentin Other (See Comments), Rash and Palpitations    Other Reaction: tachycardia Other Reaction: tachycardia    Morphine Dermatitis, Hives, Rash, Swelling and Other (See Comments)    Other reaction(s): Unknown (comments) Has tolerated hydromorphone (Dilaudid) Immediate after injections arm edema and arm turned bright red Immediate after injections arm edema and arm turned bright red IV Morphine IV Morphine    Sumatriptan Dermatitis, Hives, Itching, Other (See Comments) and Swelling    Other reaction(s): Joint Pain, Other (See Comments), Other (see comments), Unknown (comments) lock jaw Lock  jaw Lock jaw Lock jaw TIGHTENING OF JAW Lock jaw lock jaw Lock jaw TIGHTENING OF JAW Lock jaw    Zolpidem Nausea And Vomiting and Other (See Comments)    Other reaction(s): Other (see comments) sleep walking sleep walking Sleep walking  don't tolerate it well    Erythromycin Diarrhea, Nausea And Vomiting and Nausea Only    Extreme upset stomach    Valproic Acid Rash    Other reaction(s): Other (see comments), Unknown MOOD DISORDER MOOD DISORDER Depakote: Reaction unknown     Acetazolamide     Other  reaction(s): Unknown (comments)   Erythromycin Base     Other reaction(s): UNKNOWN   Amoxicillin Rash   Divalproex Sodium Anxiety and Other (See Comments)    Current Facility-Administered Medications  Medication Dose Route Frequency Provider Last Rate Last Admin   sorbitol, milk of mag, mineral oil, glycerin (SMOG) enema  960 mL Rectal Once Nena Polio, MD       Current Outpatient Medications  Medication Sig Dispense Refill   ALPRAZolam (XANAX) 0.5 MG tablet Take 1 tablet (0.5 mg total) by mouth 2 (two) times daily. 30 tablet 0   benzonatate (TESSALON) 200 MG capsule Take 1 capsule (200 mg total) by mouth 3 (three) times daily as needed for cough. 30 capsule 0   cholecalciferol (VITAMIN D) 25 MCG tablet Take 1 tablet (1,000 Units total) by mouth daily. 30 tablet 1   cyanocobalamin 1000 MCG tablet Take 1 tablet (1,000 mcg total) by mouth daily. 30 tablet 0   escitalopram (LEXAPRO) 20 MG tablet Take 1 tablet (20 mg total) by mouth daily at 12 noon. 30 tablet 1   feeding supplement (ENSURE ENLIVE / ENSURE PLUS) LIQD Take 237 mLs by mouth 3 (three) times daily between meals. 21330 mL 0   fentaNYL (DURAGESIC) 25 MCG/HR Place 1 patch onto the skin every 3 (three) days. 5 patch 0   fluticasone (FLONASE) 50 MCG/ACT nasal spray Place 2 sprays into both nostrils daily. 16 g 0   folic acid (FOLVITE) 1 MG tablet TAKE ONE TABLET BY MOUTH DAILY 90 tablet 2   HYDROcodone-acetaminophen (NORCO) 10-325 MG tablet Take 1 tablet by mouth every 6 (six) hours as needed. 60 tablet 0   Lactobacillus (PROBIOTIC ACIDOPHILUS) TABS Take by mouth.     lamoTRIgine (LAMICTAL) 25 MG tablet Take 2 tablets (50 mg total) by mouth daily. 60 tablet 1   lidocaine-prilocaine (EMLA) cream Apply to affected area once 30 g 3   Multiple Vitamin (MULTIVITAMIN WITH MINERALS) TABS tablet Take 1 tablet by mouth daily.     naloxone (NARCAN) nasal spray 4 mg/0.1 mL SPRAY 1 SPRAY INTO ONE NOSTRIL AS DIRECTED FOR OPIOID OVERDOSE (TURN  PERSON ON SIDE AFTER DOSE. IF NO RESPONSE IN 2-3 MINUTES OR PERSON RESPONDS BUT RELAPSES, REPEAT USING A NEW SPRAY DEVICE AND SPRAY INTO THE OTHER NOSTRIL. CALL 911 AFTER USE.) * EMERGENCY USE ONLY * (Patient not taking: Reported on 02/02/2021) 1 each 0   ondansetron (ZOFRAN) 8 MG tablet Take 1 tablet (8 mg total) by mouth 2 (two) times daily as needed for refractory nausea / vomiting. 60 tablet 1   prochlorperazine (COMPAZINE) 10 MG tablet Take 1 tablet (10 mg total) by mouth every 6 (six) hours as needed (Nausea or vomiting). 30 tablet 0   sucralfate (CARAFATE) 1 g tablet Take 1 tablet (1 g total) by mouth 3 (three) times daily. 90 tablet 1   thiamine 100 MG tablet Take 1 tablet (100 mg  total) by mouth daily. 30 tablet 0   Facility-Administered Medications Ordered in Other Encounters  Medication Dose Route Frequency Provider Last Rate Last Admin   heparin lock flush 100 UNIT/ML injection            heparin lock flush 100 UNIT/ML injection              Review of Systems Full ROS  was asked and was negative except for the information on the HPI  Physical Exam Blood pressure 107/60, pulse 73, temperature 97.7 F (36.5 C), temperature source Oral, resp. rate 14, height 5\' 7"  (1.702 m), weight 57.6 kg, last menstrual period 02/01/2021, SpO2 100 %. CONSTITUTIONAL: NAD. She was standing up and walking in the room w/o any issues EYES: Pupils are equal, round,  Sclera are non-icteric. EARS, NOSE, MOUTH AND THROAT: She is wearing a mask  Hearing is intact to voice. LYMPH NODES:  Lymph nodes in the neck are normal. RESPIRATORY:  Lungs are clear. There is normal respiratory effort, with equal breath sounds bilaterally, and without pathologic use of accessory muscles. CARDIOVASCULAR: Heart is regular without murmurs, gallops, or rubs. GI: The abdomen is  soft, nontender, and nondistended. There are no palpable masses. There is no hepatosplenomegaly. There are normal bowel sounds in all quadrants. GU:  Rectal deferred.   MUSCULOSKELETAL: Normal muscle strength and tone. No cyanosis or edema.   SKIN: Turgor is good and there are no pathologic skin lesions or ulcers. NEUROLOGIC: Motor and sensation is grossly normal. Cranial nerves are grossly intact. PSYCH:  Oriented to person, place and time. Affect is normal.  Data Reviewed  I have personally reviewed the patient's imaging, laboratory findings and medical records.    Assessment/Plan 46 year old female with chronic abdominal pain.  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 Her abdomen is benign and she does not have peritonitis or concerning signs.  I did enCourage her to follow-up as an outpatient with bariatric surgeon and GI.  This is more of a functional  GI pain rather than a mechanical pain that would merit surgical intervention. May manage symptomatically. She does have constipation w stool burnden that can certainly be contributing to her sxs.  No need for surgical intervention. D/w Dr. Cinda Quest in detail.   Caroleen Hamman, MD FACS General Surgeon 02/04/2021, 4:57 PM

## 2021-02-04 NOTE — Discharge Instructions (Addendum)
Please try the oxycodone 7.51 pill up to 4 times a day instead of the hydrocodone.  It stronger and should work better.  Please follow-up with your bariatric surgeon and return for worse pain or fever or continued vomiting.  I will also give you some Compazine 1 pill up to every 6 hours as needed for nausea or vomiting.  Be careful both of these medications can make you sleepy.  Do not take them with the hydrocodone.  Do not take them with the fentanyl patch unless you are sure they will not make you too sleepy or make you stop breathing.  Do not drive on these medications.  Do not operate hazardous machinery on these medications and be careful not to fall.

## 2021-02-04 NOTE — Progress Notes (Signed)
Patient called regarding Worsening of abdominal pain, worsening nausea, vomiting. History of small bowel obstruction/c. Diff colitis female recommend evaluation in ER.

## 2021-02-04 NOTE — ED Notes (Signed)
POC negative  

## 2021-02-04 NOTE — ED Notes (Signed)
Patient transported to CT 

## 2021-02-04 NOTE — ED Triage Notes (Signed)
Pt presents to ER c/o right sided abd pain that radiated to hip and back that started on Tuesday.  Pt recently dx with c-diff and patrial bowel obstruction and has finished recent abx.  Pt states she has had n/v/d with this new abd pain.  Pt is a cervical cancer pt and is on chemo and radiation.  Pt A&O x4 at this time in NAD.

## 2021-02-05 ENCOUNTER — Emergency Department: Payer: Medicare Other

## 2021-02-05 ENCOUNTER — Encounter: Payer: Self-pay | Admitting: Emergency Medicine

## 2021-02-05 ENCOUNTER — Other Ambulatory Visit: Payer: Self-pay

## 2021-02-05 ENCOUNTER — Emergency Department
Admission: EM | Admit: 2021-02-05 | Discharge: 2021-02-05 | Disposition: A | Discharge status: Home / Self Care | Payer: Medicare Other | Attending: Emergency Medicine | Admitting: Emergency Medicine

## 2021-02-05 DIAGNOSIS — R109 Unspecified abdominal pain: Secondary | ICD-10-CM | POA: Diagnosis present

## 2021-02-05 DIAGNOSIS — E860 Dehydration: Secondary | ICD-10-CM | POA: Diagnosis not present

## 2021-02-05 DIAGNOSIS — R197 Diarrhea, unspecified: Secondary | ICD-10-CM | POA: Diagnosis not present

## 2021-02-05 LAB — URINALYSIS, ROUTINE W REFLEX MICROSCOPIC
Bacteria, UA: NONE SEEN
Bilirubin Urine: NEGATIVE
Glucose, UA: >=500 mg/dL — AB
Hgb urine dipstick: NEGATIVE
Ketones, ur: 5 mg/dL — AB
Leukocytes,Ua: NEGATIVE
Nitrite: NEGATIVE
Protein, ur: NEGATIVE mg/dL
Specific Gravity, Urine: 1.02 (ref 1.005–1.030)
pH: 5 (ref 5.0–8.0)

## 2021-02-05 LAB — COMPREHENSIVE METABOLIC PANEL
ALT: 17 U/L (ref 0–44)
AST: 26 U/L (ref 15–41)
Albumin: 3.6 g/dL (ref 3.5–5.0)
Alkaline Phosphatase: 46 U/L (ref 38–126)
Anion gap: 12 (ref 5–15)
BUN: 17 mg/dL (ref 6–20)
CO2: 23 mmol/L (ref 22–32)
Calcium: 8.7 mg/dL — ABNORMAL LOW (ref 8.9–10.3)
Chloride: 103 mmol/L (ref 98–111)
Creatinine, Ser: 0.68 mg/dL (ref 0.44–1.00)
GFR, Estimated: >60 mL/min (ref >60)
Glucose, Bld: 87 mg/dL (ref 70–99)
Potassium: 4.3 mmol/L (ref 3.5–5.1)
Sodium: 138 mmol/L (ref 135–145)
Total Bilirubin: 0.4 mg/dL (ref 0.3–1.2)
Total Protein: 6.4 g/dL — ABNORMAL LOW (ref 6.5–8.1)

## 2021-02-05 LAB — CBC
HCT: 36.6 % (ref 36.0–46.0)
Hemoglobin: 11.7 g/dL — ABNORMAL LOW (ref 12.0–15.0)
MCH: 27.8 pg (ref 26.0–34.0)
MCHC: 32 g/dL (ref 30.0–36.0)
MCV: 86.9 fL (ref 80.0–100.0)
Platelets: 181 10*3/uL (ref 150–400)
RBC: 4.21 MIL/uL (ref 3.87–5.11)
RDW: 20.8 % — ABNORMAL HIGH (ref 11.5–15.5)
WBC: 5.1 10*3/uL (ref 4.0–10.5)
nRBC: 0 % (ref 0.0–0.2)

## 2021-02-05 LAB — LIPASE, BLOOD: Lipase: 30 U/L (ref 11–51)

## 2021-02-05 MED ORDER — HYDROMORPHONE HCL 1 MG/ML IJ SOLN
0.5000 mg | Freq: Once | INTRAMUSCULAR | Status: AC
  Administered 2021-02-05: 0.5 mg via INTRAVENOUS
  Filled 2021-02-05: qty 1

## 2021-02-05 MED ORDER — SODIUM CHLORIDE 0.9 % IV BOLUS
1000.0000 mL | Freq: Once | INTRAVENOUS | Status: AC
  Administered 2021-02-05: 1000 mL via INTRAVENOUS

## 2021-02-05 NOTE — ED Triage Notes (Signed)
Pt to ED via POV with c/o continued RLQ abd pain that radiates to her back. Pt states recently dx with C-diff, is currently having diarrhea. Pt states is actively receiving and radiation for cervical cancer. Pt states has exhausted all pharmacological pain management available to her at home and has had no relief.   Pt states last chemo and raidation for cervical cancer 2 days ago.

## 2021-02-05 NOTE — ED Notes (Signed)
Before pt was being up for discharge, pt left facility. Pt IV was removed from previous shift RN. Due to pt leaving before being discharge.  RN was unable to go over discharge instructions, obtain vital signs and pain assessment. Also due to pt leaving e-signature was not available.

## 2021-02-05 NOTE — ED Provider Notes (Signed)
[facility] Provider Note    Event Date/Time   First MD Initiated Contact with Patient 02/05/21 1301     (approximate)   History   Abdominal Pain and Diarrhea   HPI  Vanessa Romero is a 46 y.o. female with a history of distant gastric bypass now under active chemotherapy for cervical cancer  Who reports she feels like she has a recurrence of her C. difficile.  She was recently diagnosed and treated for C. difficile stopped antibiotic Tuesday began developing right-sided abdominal pain reports that she has now had about 15 or more very loose watery stools in the last 24 hours.  She reports a burning sensation on the right side of the abdomen which she reports is the same feeling she had when she was diagnosed and her symptoms seem the same as when she got C. difficile  She is currently under active chemo and radiation therapy and follows with Dr. Grayland Ormond  Currently reports moderate pain of her right mid abdomen.  She is had previous cholecystectomy gastric bypass.  She had a CT scan yesterday and reports is not getting better now having very very loose stools.  She cannot seem to keep up with hydration feeling lightheaded and weak to the amount of liquid stool she is having     Physical Exam   Triage Vital Signs: ED Triage Vitals  Enc Vitals Group     BP 02/05/21 0603 116/70     Pulse Rate 02/05/21 0603 (!) 115     Resp 02/05/21 0603 17     Temp 02/05/21 0603 97.9 F (36.6 C)     Temp Source 02/05/21 0603 Oral     SpO2 02/05/21 0603 100 %     Weight 02/05/21 0603 124 lb (56.2 kg)     Height 02/05/21 0603 5\' 7"  (1.702 m)     Head Circumference --      Peak Flow --      Pain Score 02/05/21 0607 8     Pain Loc --      Pain Edu? --      Excl. in [place]? --     Most recent vital signs: Vitals:   02/05/21 1300 02/05/21 1315  BP: 111/68   Pulse: 78 82  Resp:    Temp:    SpO2: 100% 100%     General: Awake, no distress.  Does appear fatigued,  slightly cachectic in nature. CV:  Good peripheral perfusion.  Mildly tachycardic.   Resp:  Normal effort.  No tachypnea.  Clear lung sounds. Abd:  No distention.  Reports mild to moderate pain to palpation in right mid abdomen.  No rebound or guarding.  No distention. Other:  Grossly normal cranial nerves.  No noted deficits. Skin exam seems just slightly reduced skin turgor.  Her mucous membranes are slightly dry.  No sunken skin, no findings of severe dehydration to noted.  ED Results / Procedures / Treatments   Labs (all labs ordered are listed, but only abnormal results are displayed) Labs Reviewed  COMPREHENSIVE METABOLIC PANEL - Abnormal; Notable for the following components:      Result Value   Calcium 8.7 (*)    Total Protein 6.4 (*)    All other components within normal limits  CBC - Abnormal; Notable for the following components:   Hemoglobin 11.7 (*)    RDW 20.8 (*)    All other components within normal limits  URINALYSIS, ROUTINE W REFLEX MICROSCOPIC - Abnormal;  Notable for the following components:   Color, Urine YELLOW (*)    APPearance CLEAR (*)    Glucose, UA >=500 (*)    Ketones, ur 5 (*)    All other components within normal limits  GASTROINTESTINAL PANEL BY PCR, STOOL (REPLACES STOOL CULTURE)  C DIFFICILE QUICK SCREEN W PCR REFLEX    LIPASE, BLOOD  POC URINE PREG, ED    RADIOLOGY DG Abd 2 Views  Result Date: 02/05/2021 CLINICAL DATA:  Right-sided abdominal pain EXAM: ABDOMEN - 2 VIEW COMPARISON:  None. FINDINGS: Nonobstructive pattern of bowel gas. No free air in the abdomen. Scattered stool throughout the colon. No radio-opaque calculi or other significant radiographic abnormality is seen. IMPRESSION: Nonobstructive pattern of bowel gas. Scattered stool throughout the colon. No free air in the abdomen. Electronically Signed   By: Delanna Ahmadi M.D.   On: 02/05/2021 13:46    Abdominal imaging reviewed, negative for obstructive pathology  noted.    PROCEDURES:  Critical Care performed: no   Procedures   MEDICATIONS ORDERED IN ED: Medications  sodium chloride 0.9 % bolus 1,000 mL (1,000 mLs Intravenous New Bag/Given 02/05/21 1416)  HYDROmorphone (DILAUDID) injection 0.5 mg (0.5 mg Intravenous Given 02/05/21 1416)     IMPRESSION / MDM / ASSESSMENT AND PLAN / ED COURSE  I reviewed the triage vital signs and the nursing notes.                              Differential diagnosis includes, but is not limited to, obstruction though this seems less likely given her recent consultation and CT imaging from yesterday.  I am also concerned about recurrent infectious gastroenteritis. Considerations would certainly be obstructive abnormality, infectious etiologies, though she had a CT scan done yesterday and surgical consultation was performed at that time which was reassuring.  Have independently viewed the patient's abdominal imaging which is negative for obstruction Have personally reviewed the patient's labs which demonstrated normal white count normal metabolic panel normal creatinine, urinalysis which does not show evidence of infection  ----------------------------------------- 2:28 PM on 02/05/2021 -----------------------------------------   Her work-up seems to be quite reassuring to this point.  She has appropriate and available outpatient follow-up.  I did notify Dr. Grayland Ormond of her presentation today.  At this point, I think would be reasonable and discussed with the patient plan for discharge with close outpatient follow-up and in the interim she can submit stool samples for retesting for infectious pathologies to our lab.    Clinical Course as of 02/05/21 1456  Sat Feb 05, 2021  1424 Patient reported she is having large-volume loose stool contents consistently.  At this point she is not and I discussed with nurse Erlene Quan is well produced any stool here in the ED since arrival.  I have discussed her case with  Dr. Dahlia Byes as well and he advises that he believes the patient would be appropriate for discharge especially given her reassuring evaluation yesterday.  She is resting comfortably at this time without evidence of ongoing pain or distress.  She is in no distress now, and I think this would be appropriate. [MQ]  1425 I will order outpatient stool testing which she can submit if able to produce a sample at home.  At the present time she is been rehydrated, resting without distress.  No further evidence of large-volume stooling or diarrhea. [MQ]    Clinical Course User Index [MQ] Delman Kitten, MD  Discussed with the patient plan of care including follow-up with Dr. Grayland Ormond and submission of outpatient stool testing.  She is agreeable with this plan.  Currently completing fluid bolus.  She is comfortable with plan for discharge thereafter and follow-up outpatient with Dr. Grayland Ormond and general surgery   Dr. Grayland Ormond advised that he will have oncology follow-up with her this coming week  FINAL CLINICAL IMPRESSION(S) / ED DIAGNOSES   Final diagnoses:  Dehydration, mild  Right sided abdominal pain     Rx / DC Orders   ED Discharge Orders          Ordered    Clostridium difficile,EIA        02/05/21 1450    Stool culture        02/05/21 1450             Note:  This document was prepared using Dragon voice recognition software and may include unintentional dictation errors.   Ongoing care and disposition the patient is signed to Dr. Cinda Quest At 315p  Dr. Rip Harbour also has seen and evaluated the patient yesterday, advised that he had ordered a enema yesterday.   Delman Kitten, MD 02/05/21 1524

## 2021-02-05 NOTE — ED Provider Notes (Addendum)
Patient was being planned for discharge.  I went into the room to check on her and she was not there.  All of her sheets etc. were piled on the bed.  She apparently eloped.  Patient had been here yesterday.  She had gotten a smog enema yesterday because her colon was stool loaded on the CT that we did.  We thought that possibly some of the pain she was having could be due to the stool load. Nena Polio, MD 02/05/21 1617    Nena Polio, MD 02/05/21 [phone]

## 2021-02-06 ENCOUNTER — Encounter: Payer: Self-pay | Admitting: Oncology

## 2021-02-07 ENCOUNTER — Ambulatory Visit
Admission: RE | Admit: 2021-02-07 | Discharge: 2021-02-07 | Disposition: A | Discharge status: Home / Self Care | Payer: Medicare Other | Source: Ambulatory Visit | Attending: Radiation Oncology | Admitting: Radiation Oncology

## 2021-02-07 DIAGNOSIS — Z5111 Encounter for antineoplastic chemotherapy: Secondary | ICD-10-CM | POA: Diagnosis not present

## 2021-02-08 ENCOUNTER — Ambulatory Visit
Admission: RE | Admit: 2021-02-08 | Discharge: 2021-02-08 | Disposition: A | Discharge status: Home / Self Care | Payer: Medicare Other | Source: Ambulatory Visit | Attending: Radiation Oncology | Admitting: Radiation Oncology

## 2021-02-08 DIAGNOSIS — Z5111 Encounter for antineoplastic chemotherapy: Secondary | ICD-10-CM | POA: Diagnosis not present

## 2021-02-09 ENCOUNTER — Encounter: Payer: Self-pay | Admitting: Oncology

## 2021-02-09 ENCOUNTER — Inpatient Hospital Stay: Payer: Medicare Other

## 2021-02-09 ENCOUNTER — Other Ambulatory Visit: Payer: Self-pay

## 2021-02-09 ENCOUNTER — Ambulatory Visit
Admission: RE | Admit: 2021-02-09 | Discharge: 2021-02-09 | Disposition: A | Discharge status: Home / Self Care | Payer: Medicare Other | Source: Ambulatory Visit | Attending: Radiation Oncology | Admitting: Radiation Oncology

## 2021-02-09 ENCOUNTER — Inpatient Hospital Stay (HOSPITAL_BASED_OUTPATIENT_CLINIC_OR_DEPARTMENT_OTHER): Payer: Medicare Other | Admitting: Oncology

## 2021-02-09 VITALS — BP 126/85 | HR 95 | Temp 96.3°F | Wt 126.5 lb

## 2021-02-09 DIAGNOSIS — C539 Malignant neoplasm of cervix uteri, unspecified: Secondary | ICD-10-CM

## 2021-02-09 DIAGNOSIS — Z5111 Encounter for antineoplastic chemotherapy: Secondary | ICD-10-CM | POA: Diagnosis not present

## 2021-02-09 LAB — COMPREHENSIVE METABOLIC PANEL
ALT: 12 U/L (ref 0–44)
AST: 22 U/L (ref 15–41)
Albumin: 3.5 g/dL (ref 3.5–5.0)
Alkaline Phosphatase: 47 U/L (ref 38–126)
Anion gap: 7 (ref 5–15)
BUN: 9 mg/dL (ref 6–20)
CO2: 24 mmol/L (ref 22–32)
Calcium: 8.8 mg/dL — ABNORMAL LOW (ref 8.9–10.3)
Chloride: 106 mmol/L (ref 98–111)
Creatinine, Ser: 0.6 mg/dL (ref 0.44–1.00)
GFR, Estimated: >60 mL/min (ref >60)
Glucose, Bld: 140 mg/dL — ABNORMAL HIGH (ref 70–99)
Potassium: 3.1 mmol/L — ABNORMAL LOW (ref 3.5–5.1)
Sodium: 137 mmol/L (ref 135–145)
Total Bilirubin: 0.4 mg/dL (ref 0.3–1.2)
Total Protein: 6.2 g/dL — ABNORMAL LOW (ref 6.5–8.1)

## 2021-02-09 LAB — CBC WITH DIFFERENTIAL/PLATELET
Abs Immature Granulocytes: 0.02 10*3/uL (ref 0.00–0.07)
Basophils Absolute: 0 10*3/uL (ref 0.0–0.1)
Basophils Relative: 1 %
Eosinophils Absolute: 0.4 10*3/uL (ref 0.0–0.5)
Eosinophils Relative: 13 %
HCT: 35.6 % — ABNORMAL LOW (ref 36.0–46.0)
Hemoglobin: 11.5 g/dL — ABNORMAL LOW (ref 12.0–15.0)
Immature Granulocytes: 1 %
Lymphocytes Relative: 18 %
Lymphs Abs: 0.5 10*3/uL — ABNORMAL LOW (ref 0.7–4.0)
MCH: 27.6 pg (ref 26.0–34.0)
MCHC: 32.3 g/dL (ref 30.0–36.0)
MCV: 85.4 fL (ref 80.0–100.0)
Monocytes Absolute: 0.4 10*3/uL (ref 0.1–1.0)
Monocytes Relative: 13 %
Neutro Abs: 1.5 10*3/uL — ABNORMAL LOW (ref 1.7–7.7)
Neutrophils Relative %: 54 %
Platelets: 169 10*3/uL (ref 150–400)
RBC: 4.17 MIL/uL (ref 3.87–5.11)
RDW: 19.7 % — ABNORMAL HIGH (ref 11.5–15.5)
WBC: 2.8 10*3/uL — ABNORMAL LOW (ref 4.0–10.5)
nRBC: 0 % (ref 0.0–0.2)

## 2021-02-09 MED ORDER — HYDROCODONE-ACETAMINOPHEN 5-325 MG PO TABS
1.0000 | ORAL_TABLET | Freq: Once | ORAL | Status: AC
  Administered 2021-02-09: 12:00:00 1 via ORAL
  Filled 2021-02-09: qty 1

## 2021-02-09 MED ORDER — HEPARIN SOD (PORK) LOCK FLUSH 100 UNIT/ML IV SOLN
500.0000 [IU] | Freq: Once | INTRAVENOUS | Status: AC | PRN
  Filled 2021-02-09: qty 5

## 2021-02-09 MED ORDER — HEPARIN SOD (PORK) LOCK FLUSH 100 UNIT/ML IV SOLN
INTRAVENOUS | Status: AC
  Administered 2021-02-09: 14:00:00 500 [IU]
  Filled 2021-02-09: qty 5

## 2021-02-09 MED ORDER — DIPHENOXYLATE-ATROPINE 2.5-0.025 MG PO TABS
1.0000 | ORAL_TABLET | Freq: Once | ORAL | Status: AC
  Administered 2021-02-09: 14:00:00 1 via ORAL
  Filled 2021-02-09: qty 1

## 2021-02-09 MED ORDER — DIPHENHYDRAMINE HCL 50 MG/ML IJ SOLN
25.0000 mg | Freq: Once | INTRAMUSCULAR | Status: AC
  Administered 2021-02-09: 10:00:00 25 mg via INTRAVENOUS
  Filled 2021-02-09: qty 1

## 2021-02-09 MED ORDER — DIPHENOXYLATE-ATROPINE 2.5-0.025 MG PO TABS: 1.0000 | ORAL_TABLET | Freq: Four times a day (QID) | ORAL | 2 refills | Status: DC | PRN

## 2021-02-09 MED ORDER — SODIUM CHLORIDE 0.9 % IV SOLN
Freq: Once | INTRAVENOUS | Status: AC
  Filled 2021-02-09: qty 250

## 2021-02-09 MED ORDER — SODIUM CHLORIDE 0.9 % IV SOLN
40.0000 mg/m2 | Freq: Once | INTRAVENOUS | Status: AC
  Administered 2021-02-09: 12:00:00 66 mg via INTRAVENOUS
  Filled 2021-02-09: qty 11

## 2021-02-09 MED ORDER — FAMOTIDINE IN NACL 20-0.9 MG/50ML-% IV SOLN
20.0000 mg | Freq: Once | INTRAVENOUS | Status: AC
  Administered 2021-02-09: 11:00:00 20 mg via INTRAVENOUS
  Filled 2021-02-09: qty 50

## 2021-02-09 MED ORDER — PALONOSETRON HCL INJECTION 0.25 MG/5ML
0.2500 mg | Freq: Once | INTRAVENOUS | Status: AC
  Administered 2021-02-09: 10:00:00 0.25 mg via INTRAVENOUS
  Filled 2021-02-09: qty 5

## 2021-02-09 MED ORDER — SODIUM CHLORIDE 0.9 % IV SOLN
10.0000 mg | Freq: Once | INTRAVENOUS | Status: AC
  Administered 2021-02-09: 11:00:00 10 mg via INTRAVENOUS
  Filled 2021-02-09: qty 10

## 2021-02-09 MED ORDER — SODIUM CHLORIDE 0.9 % IV SOLN
220.8000 mg | Freq: Once | INTRAVENOUS | Status: AC
  Administered 2021-02-09: 13:00:00 220 mg via INTRAVENOUS
  Filled 2021-02-09: qty 22

## 2021-02-09 NOTE — Patient Instructions (Signed)
°  [facility] AT [place]  Discharge Instructions: Thank you for choosing [facility] to provide your oncology and hematology care.  If you have a lab appointment with the [facility], please go directly to the [facility] and check in at the registration area.  Wear comfortable clothing and clothing appropriate for easy access to any Portacath or PICC line.   We strive to give you quality time with your provider. You may need to reschedule your appointment if you arrive late (15 or more minutes).  Arriving late affects you and other patients whose appointments are after yours.  Also, if you miss three or more appointments without notifying the office, you may be dismissed from the clinic at the providers discretion.      For prescription refill requests, have your pharmacy contact our office and allow 72 hours for refills to be completed.  T Today you received the following chemotherapy and/or immunotherapy agents: Carboplatin, Taxol      To help prevent nausea and vomiting after your treatment, we encourage you to take your nausea medication as directed.  BELOW ARE SYMPTOMS THAT SHOULD BE REPORTED IMMEDIATELY: *FEVER GREATER THAN 100.4 F (38 C) OR HIGHER *CHILLS OR SWEATING *NAUSEA AND VOMITING THAT IS NOT CONTROLLED WITH YOUR NAUSEA MEDICATION *UNUSUAL SHORTNESS OF BREATH *UNUSUAL BRUISING OR BLEEDING *URINARY PROBLEMS (pain or burning when urinating, or frequent urination) *BOWEL PROBLEMS (unusual diarrhea, constipation, pain near the anus) TENDERNESS IN MOUTH AND THROAT WITH OR WITHOUT PRESENCE OF ULCERS (sore throat, sores in mouth, or a toothache) UNUSUAL RASH, SWELLING OR PAIN  UNUSUAL VAGINAL DISCHARGE OR ITCHING   Items with * indicate a potential emergency and should be followed up as soon as possible or go to the Emergency Department if any problems should occur.  Please show the CHEMOTHERAPY ALERT CARD or IMMUNOTHERAPY ALERT CARD at  check-in to the Emergency Department and triage nurse.  Should you have questions after your visit or need to cancel or reschedule your appointment, please contact [facility] [place] [facility] [place]  [phone] and follow the prompts.  Office hours are 8:00 a.m. to 4:30 p.m. Monday - Friday. Please note that voicemails left after 4:00 p.m. may not be returned until the following business day.  We are closed weekends and major holidays. You have access to a nurse at all times for urgent questions. Please call the main number to the clinic [phone] and follow the prompts.  For any non-urgent questions, you may also contact your provider using MyChart. We now offer e-Visits for anyone 71 and older to request care online for non-urgent symptoms. For details visit [email or web address].   Also download the MyChart app! Go to the app store, search "MyChart", open the app, select [facility], and log in with your MyChart username and password.  Due to Covid, a mask is required upon entering the hospital/clinic. If you do not have a mask, one will be given to you upon arrival. For doctor visits, patients may have 1 support person aged 43 or older with them. For treatment visits, patients cannot have anyone with them due to current Covid guidelines and our immunocompromised population.

## 2021-02-09 NOTE — Progress Notes (Signed)
[place]  [phone] [phone]  ID: Vanessa Romero OB: 12/17/1975  MR#: [id number]  AOZ#:[id number]  Patient Care Team: Anselmo Pickler, MD as PCP - General (Internal Medicine)  CHIEF COMPLAINT: Stage IIb adenocarcinoma of the cervix.  INTERVAL HISTORY: Patient returns to clinic today for further evaluation and consideration of cycle 5 of carboplatin and Taxol.  She is once again seen in the emergency room for increased diarrhea, but then eloped prior to work-up being completed.  Her diarrhea has resolved and she feels nearly back to her baseline.  She continues to have significant weakness and fatigue. She has no neurologic complaints.  She denies any recent fevers or illnesses.  She has a good appetite and denies weight loss. She has no chest pain, shortness of breath, cough, or hemoptysis.  She denies any nausea, vomiting, or constipation.  She does not complain of abdominal pain today.  She has no urinary complaints.  Patient offers no further specific complaints today.  REVIEW OF SYSTEMS:   Review of Systems  Constitutional:  Positive for malaise/fatigue. Negative for fever and weight loss.  Respiratory: Negative.  Negative for cough, hemoptysis and shortness of breath.   Cardiovascular: Negative.  Negative for chest pain and leg swelling.  Gastrointestinal:  Positive for diarrhea. Negative for abdominal pain.  Genitourinary: Negative.  Negative for dysuria.  Musculoskeletal:  Negative for back pain.  Skin: Negative.  Negative for rash.  Neurological:  Positive for weakness. Negative for dizziness, focal weakness and headaches.  Psychiatric/Behavioral: Negative.  The patient is not nervous/anxious.    As per HPI. Otherwise, a complete review of systems is negative.  PAST MEDICAL HISTORY: Past Medical History:  Diagnosis Date   Alcohol abuse    Anemia    Cancer ([place])    Tobacco dependence     PAST SURGICAL HISTORY: Past  Surgical History:  Procedure Laterality Date   ABDOMINAL ADHESION SURGERY     bowel obstruction     x2   CERVICAL CONIZATION W/BX N/A 11/26/2020   Procedure: CONIZATION CERVIX WITH BIOPSY;  Surgeon: Malachy Mood, MD;  Location: [facility] ORS;  Service: Gynecology;  Laterality: N/A;   ESOPHAGOGASTRODUODENOSCOPY N/A 11/24/2020   Procedure: ESOPHAGOGASTRODUODENOSCOPY (EGD);  Surgeon: Lin Landsman, MD;  Location: [facility] ENDOSCOPY;  Service: Gastroenterology;  Laterality: N/A;   laparoscopic knee surgery     PORTA CATH INSERTION N/A 12/20/2020   Procedure: PORTA CATH INSERTION;  Surgeon: Algernon Huxley, MD;  Location: [place] [facility];  Service: Cardiovascular;  Laterality: N/A;   ROUX-EN-Y GASTRIC BYPASS     TEAR DUCT PROBING     unclogg   VAGOTOMY     VENTRICULOPERITONEAL SHUNT     x6 put in and removals    FAMILY HISTORY: Family History  Problem Relation Age of Onset   Cancer Mother    Cancer Father    Cancer Maternal Grandmother     ADVANCED DIRECTIVES (Y/N):  N  HEALTH MAINTENANCE: Social History   Tobacco Use   Smoking status: Former    Types: Cigarettes   Smokeless tobacco: Former  Scientific laboratory technician Use: Former  Substance Use Topics   Alcohol use: Not Currently   Drug use: Not Currently     Colonoscopy:  PAP:  Bone density:  Lipid panel:  Allergies  Allergen Reactions   Contrast Media [Iodinated Contrast Media] Hives   Gabapentin Other (See Comments), Rash and Palpitations    Other Reaction: tachycardia Other Reaction: tachycardia  Morphine Dermatitis, Hives, Rash, Swelling and Other (See Comments)    Other reaction(s): Unknown (comments) Has tolerated hydromorphone (Dilaudid) Immediate after injections arm edema and arm turned bright red Immediate after injections arm edema and arm turned bright red IV Morphine IV Morphine    Sumatriptan Dermatitis, Hives, Itching, Other (See Comments) and Swelling    Other reaction(s): Joint Pain, Other  (See Comments), Other (see comments), Unknown (comments) lock jaw Lock jaw Lock jaw Lock jaw TIGHTENING OF JAW Lock jaw lock jaw Lock jaw TIGHTENING OF JAW Lock jaw    Zolpidem Nausea And Vomiting and Other (See Comments)    Other reaction(s): Other (see comments) sleep walking sleep walking Sleep walking  don't tolerate it well    Erythromycin Diarrhea, Nausea And Vomiting and Nausea Only    Extreme upset stomach    Valproic Acid Rash    Other reaction(s): Other (see comments), Unknown MOOD DISORDER MOOD DISORDER Depakote: Reaction unknown     Acetazolamide     Other reaction(s): Unknown (comments)   Erythromycin Base     Other reaction(s): UNKNOWN   Amoxicillin Rash   Divalproex Sodium Anxiety and Other (See Comments)    Current Outpatient Medications  Medication Sig Dispense Refill   ALPRAZolam (XANAX) 0.5 MG tablet Take 1 tablet (0.5 mg total) by mouth 2 (two) times daily. 30 tablet 0   benzonatate (TESSALON) 200 MG capsule Take 1 capsule (200 mg total) by mouth 3 (three) times daily as needed for cough. 30 capsule 0   cholecalciferol (VITAMIN D) 25 MCG tablet Take 1 tablet (1,000 Units total) by mouth daily. 30 tablet 1   cyanocobalamin 1000 MCG tablet Take 1 tablet (1,000 mcg total) by mouth daily. 30 tablet 0   diphenoxylate-atropine (LOMOTIL) 2.5-0.025 MG tablet Take 1 tablet by mouth 4 (four) times daily as needed for diarrhea or loose stools. 60 tablet 2   escitalopram (LEXAPRO) 20 MG tablet Take 1 tablet (20 mg total) by mouth daily at 12 noon. 30 tablet 1   feeding supplement (ENSURE ENLIVE / ENSURE PLUS) LIQD Take 237 mLs by mouth 3 (three) times daily between meals. 21330 mL 0   fentaNYL (DURAGESIC) 25 MCG/HR Place 1 patch onto the skin every 3 (three) days. 5 patch 0   fluticasone (FLONASE) 50 MCG/ACT nasal spray Place 2 sprays into both nostrils daily. 16 g 0   folic acid (FOLVITE) 1 MG tablet TAKE ONE TABLET BY MOUTH DAILY 90 tablet 2    HYDROcodone-acetaminophen (NORCO) 10-325 MG tablet Take 1 tablet by mouth every 6 (six) hours as needed. 60 tablet 0   Lactobacillus (PROBIOTIC ACIDOPHILUS) TABS Take by mouth.     lamoTRIgine (LAMICTAL) 25 MG tablet Take 2 tablets (50 mg total) by mouth daily. 60 tablet 1   lidocaine-prilocaine (EMLA) cream Apply to affected area once 30 g 3   Multiple Vitamin (MULTIVITAMIN WITH MINERALS) TABS tablet Take 1 tablet by mouth daily.     naloxone (NARCAN) nasal spray 4 mg/0.1 mL SPRAY 1 SPRAY INTO ONE NOSTRIL AS DIRECTED FOR OPIOID OVERDOSE (TURN PERSON ON SIDE AFTER DOSE. IF NO RESPONSE IN 2-3 MINUTES OR PERSON RESPONDS BUT RELAPSES, REPEAT USING A NEW SPRAY DEVICE AND SPRAY INTO THE OTHER NOSTRIL. CALL 911 AFTER USE.) * EMERGENCY USE ONLY * (Patient not taking: Reported on 02/02/2021) 1 each 0   ondansetron (ZOFRAN) 8 MG tablet Take 1 tablet (8 mg total) by mouth 2 (two) times daily as needed for refractory nausea / vomiting. 60 tablet  1   oxyCODONE-acetaminophen (PERCOCET) 7.5-325 MG tablet Take 1 tablet by mouth every 4 (four) hours as needed for severe pain. 20 tablet 0   prochlorperazine (COMPAZINE) 10 MG tablet Take 1 tablet (10 mg total) by mouth every 6 (six) hours as needed (Nausea or vomiting). 30 tablet 0   prochlorperazine (COMPAZINE) 10 MG tablet Take 1 tablet (10 mg total) by mouth every 6 (six) hours as needed for nausea or vomiting. 30 tablet 0   sucralfate (CARAFATE) 1 g tablet Take 1 tablet (1 g total) by mouth 3 (three) times daily. 90 tablet 1   thiamine 100 MG tablet Take 1 tablet (100 mg total) by mouth daily. 30 tablet 0   No current facility-administered medications for this visit.   Facility-Administered Medications Ordered in Other Visits  Medication Dose Route Frequency Provider Last Rate Last Admin   heparin lock flush 100 UNIT/ML injection            heparin lock flush 100 UNIT/ML injection             OBJECTIVE: Vitals:   02/09/21 0857  BP: 126/85  Pulse: 95   Temp: (!) 96.3 F (35.7 C)     Body mass index is 19.81 kg/m.    ECOG FS:0 - Asymptomatic  General: Well-developed, well-nourished, no acute distress. Eyes: Pink conjunctiva, anicteric sclera. HEENT: Normocephalic, moist mucous membranes. Lungs: No audible wheezing or coughing. Heart: Regular rate and rhythm. Abdomen: Soft, nontender, no obvious distention. Musculoskeletal: No edema, cyanosis, or clubbing. Neuro: Alert, answering all questions appropriately. Cranial nerves grossly intact. Skin: No rashes or petechiae noted. Psych: Normal affect.   LAB RESULTS:  Lab Results  Component Value Date   NA 137 02/09/2021   K 3.1 (L) 02/09/2021   CL 106 02/09/2021   CO2 24 02/09/2021   GLUCOSE 140 (H) 02/09/2021   BUN 9 02/09/2021   CREATININE 0.60 02/09/2021   CALCIUM 8.8 (L) 02/09/2021   PROT 6.2 (L) 02/09/2021   ALBUMIN 3.5 02/09/2021   AST 22 02/09/2021   ALT 12 02/09/2021   ALKPHOS 47 02/09/2021   BILITOT 0.4 02/09/2021   GFRNONAA >60 02/09/2021    Lab Results  Component Value Date   WBC 2.8 (L) 02/09/2021   NEUTROABS 1.5 (L) 02/09/2021   HGB 11.5 (L) 02/09/2021   HCT 35.6 (L) 02/09/2021   MCV 85.4 02/09/2021   PLT 169 02/09/2021     STUDIES: CT ABDOMEN PELVIS WO CONTRAST  Result Date: 02/04/2021 CLINICAL DATA:  Right-sided abdominal pain EXAM: CT ABDOMEN AND PELVIS WITHOUT CONTRAST TECHNIQUE: Multidetector CT imaging of the abdomen and pelvis was performed following the standard protocol without IV contrast. RADIATION DOSE REDUCTION: This exam was performed according to the departmental dose-optimization program which includes automated exposure control, adjustment of the mA and/or kV according to patient size and/or use of iterative reconstruction technique. COMPARISON:  CT abdomen and pelvis 01/22/2021 FINDINGS: Lower chest: No acute abnormality. Hepatobiliary: Liver is normal in size and contour with no suspicious mass identified. Gallbladder is surgically  absent. Persistent mild biliary ductal dilatation including common bile duct measuring 11 mm in diameter, unchanged. Pancreas: Unremarkable. No pancreatic ductal dilatation or surrounding inflammatory changes. Spleen: Normal in size without focal abnormality. Adrenals/Urinary Tract: Adrenal glands are unremarkable. Kidneys are normal, without renal calculi, focal lesion, or hydronephrosis. Bladder is unremarkable. Stomach/Bowel: Gastric bypass surgical changes. The proximal hepatobiliary limb is fluid-filled and mild-to-moderately distended with the duodenum measuring 3.1 cm in diameter. The bowel is not  distended at or around the distal jejunojejunal anastomosis. No bowel wall edema identified. Large amount of retained fecal material in the colon. No evidence of acute appendicitis. Vascular/Lymphatic: No significant vascular findings are present. No enlarged abdominal or pelvic lymph nodes. Reproductive: Uterus and bilateral adnexa are unremarkable. Other: No ascites. Musculoskeletal: No suspicious bony lesions. IMPRESSION: 1. Gastric bypass surgical changes. Fluid-filled mild-to-moderate distension of the hepatobiliary limb/afferent limb, increased since previous study. No abnormal bowel distension visualized more distally near the jejunal-jejunal anastomosis. Nonspecific and could represent early/partial obstruction, gastrogastric fistula, transient distension. Correlate clinically and follow-up as indicated. 2. Large amount of retained fecal material throughout the colon, correlate for constipation. 3. Stable biliary ductal dilatation, most likely compensatory from cholecystectomy. Correlate with bilirubin levels. Electronically Signed   By: Ofilia Neas M.D.   On: 02/04/2021 09:22   CT ABDOMEN PELVIS WO CONTRAST  Result Date: 01/22/2021 CLINICAL DATA:  Right-sided abdominal pain. EXAM: CT ABDOMEN AND PELVIS WITHOUT CONTRAST TECHNIQUE: Multidetector CT imaging of the abdomen and pelvis was performed  following the standard protocol without IV contrast. COMPARISON:  January 19, 2021 FINDINGS: Lower chest: No acute abnormality. Hepatobiliary: No focal liver abnormality is seen. Status post cholecystectomy. The common bile duct measures 1.2 cm in diameter. Pancreas: Unremarkable. No pancreatic ductal dilatation or surrounding inflammatory changes. Spleen: Normal in size without focal abnormality. Adrenals/Urinary Tract: Adrenal glands are unremarkable. Kidneys are normal, without renal calculi, focal lesion, or hydronephrosis. The urinary bladder is poorly distended and subsequently limited in evaluation. Stomach/Bowel: Surgical sutures are seen within the gastric region. Surgically anastomosed bowel is also noted within the mid left abdomen. The appendix is not clearly identified. Stool is seen throughout the large bowel. Moderately inflamed small bowel loops are suspected within the pelvis. It should be noted that this is limited in evaluation in the absence of oral contrast. A short segment of dilated small bowel is also suspected within the lower pelvis on the left (axial CT image 67 through 74, CT series 2). Vascular/Lymphatic: Very mild aortic atherosclerosis. No enlarged abdominal or pelvic lymph nodes. Reproductive: Uterus is mildly enlarged and heterogeneous in appearance. A 2.1 cm diameter cyst is noted along the posterior aspect of the right adnexa. Other: No abdominal wall hernia or abnormality. No abdominopelvic ascites. Musculoskeletal: No acute or significant osseous findings. IMPRESSION: 1. Moderate severity enteritis involving multiple loops of distal small bowel with additional findings that may represent a subsequent distal small bowel obstruction. 2. Evidence of prior gastric bypass surgery. 3. Evidence of prior cholecystectomy. 4. 2.1 cm diameter right adnexal cyst, likely ovarian in origin. 5. Very mild aortic atherosclerosis. Aortic Atherosclerosis (ICD10-I70.0). Electronically Signed   By:  Virgina Norfolk M.D.   On: 01/22/2021 01:44   CT Abdomen Pelvis Wo Contrast  Result Date: 01/19/2021 CLINICAL DATA:  Abdominal pain. Diarrhea, nausea, and vomiting. Current treatment for Clostridium difficile colitis. EXAM: CT ABDOMEN AND PELVIS WITHOUT CONTRAST TECHNIQUE: Multidetector CT imaging of the abdomen and pelvis was performed following the standard protocol without IV contrast. COMPARISON:  CT abdomen and pelvis 01/09/2021 FINDINGS: Lower chest: Clear lung bases. Hepatobiliary: No focal liver abnormality is seen. Status post cholecystectomy with mildly increased extrahepatic biliary dilatation compared to the prior study with the common bile duct measuring approximately 1.1 cm in diameter proximally and tapering distally. Pancreas: Unremarkable. Spleen: Small calcification in the central aspect of the spleen. Adrenals/Urinary Tract: Unremarkable adrenal glands. No evidence of renal mass, calculi, or hydronephrosis. Nondistended bladder. Stomach/Bowel: Sequelae of Roux-en-Y gastric bypass  are again identified. Bowel assessment is limited by the absence of IV and oral contrast material. There is no evidence of bowel obstruction. Distal small bowel and distal colonic inflammation on the prior CT appears improved. There is a moderate amount of right-sided colonic stool. Vascular/Lymphatic: Mild abdominal aortic atherosclerosis without aneurysm. No enlarged lymph nodes. Reproductive: Grossly unremarkable uterus and adnexa. Other: At most trace pelvic free fluid.  No pneumoperitoneum. Musculoskeletal: No acute osseous abnormality or suspicious osseous lesion. IMPRESSION: 1. Improved distal small bowel and colonic inflammation. No evidence of bowel obstruction. 2. Mildly increased extrahepatic biliary dilatation. Recommend laboratory correlation. 3. Aortic Atherosclerosis (ICD10-I70.0). Electronically Signed   By: Logan Bores M.D.   On: 01/19/2021 10:29   DG Abd 2 Views  Result Date:  02/05/2021 CLINICAL DATA:  Right-sided abdominal pain EXAM: ABDOMEN - 2 VIEW COMPARISON:  None. FINDINGS: Nonobstructive pattern of bowel gas. No free air in the abdomen. Scattered stool throughout the colon. No radio-opaque calculi or other significant radiographic abnormality is seen. IMPRESSION: Nonobstructive pattern of bowel gas. Scattered stool throughout the colon. No free air in the abdomen. Electronically Signed   By: Delanna Ahmadi M.D.   On: 02/05/2021 13:46   DG Abd 2 Views  Result Date: 02/01/2021 CLINICAL DATA:  46 year old female with right lower quadrant pain, nausea vomiting diarrhea for 2 days. Prior gastric bypass. EXAM: ABDOMEN - 2 VIEW COMPARISON:  CT Abdomen and Pelvis 01/22/2021 and earlier. FINDINGS: Upright and supine views of the abdomen and pelvis. Non obstructed bowel gas pattern. Negative lung bases. No pneumoperitoneum. Left upper quadrant, left mid abdomen, and right upper quadrant postoperative changes with clips and staple lines. Incidental pelvic phleboliths. Partially visible mediastinal vascular catheter at the cavoatrial junction. No acute osseous abnormality identified. IMPRESSION: Normal bowel gas pattern, no free air. Prior gastric bypass and cholecystectomy. Electronically Signed   By: Genevie Ann M.D.   On: 02/01/2021 11:15   DG ABD ACUTE 2+V W 1V CHEST  Result Date: 01/11/2021 CLINICAL DATA:  Right-sided abdominal pain. EXAM: DG ABDOMEN ACUTE WITH 1 VIEW CHEST COMPARISON:  January 05, 2021 FINDINGS: There is no evidence of dilated bowel loops or free intraperitoneal air. No radiopaque calculi or other significant radiographic abnormality is seen. Radiopaque surgical clips are seen within the right upper quadrant, with radiopaque surgical sutures noted along the medial aspect of the left upper quadrant. Subcentimeter phleboliths are noted within the lower pelvis. A right-sided venous Port-A-Cath is seen with its distal tip noted at the junction of the superior vena cava  and right atrium. Heart size and mediastinal contours are within normal limits. Both lungs are clear. IMPRESSION: Negative abdominal radiographs.  No acute cardiopulmonary disease. Electronically Signed   By: Virgina Norfolk M.D.   On: 01/11/2021 04:08    ASSESSMENT: Stage IIb adenocarcinoma of the cervix.  PLAN:    Stage IIb adenocarcinoma of the cervix: By report patient is not a surgical candidate, therefore will proceed with concurrent XRT along with weekly chemotherapy using carboplatinum and Taxol.  She also will benefit from adjuvant brachytherapy which is scheduled at [facility] in early February.  PET scan results from December 06, 2020 reviewed independently with no obvious evidence of malignancy outside of patient's known cervical cancer.  Recently, treatment was delayed nearly 1 month secondary to multiple hospital admissions.  Continue daily XRT, completing treatment on February 15, 2021.  Proceed with patient's fifth and final treatment of weekly carboplatinum and Taxol.  Return to clinic in mid February  after her brachytherapy for laboratory work and routine evaluation.   Anemia: Hemoglobin mildly improved to 11.5, monitor. Thrombocytopenia: Resolved. Pelvic pain: Patient did not complain of pain while in clinic, but during infusion was having increased pain and was given 1 tab of Norco.   Leukopenia: Mild, monitor.  Proceed with treatment as above.   Diarrhea: Resolved, although patient had diarrhea and fusion and was given a prescription for Lomotil.  Patient expressed understanding and was in agreement with this plan. She also understands that She can call clinic at any time with any questions, concerns, or complaints.    Cancer Staging  Cervical cancer, FIGO stage IIB ([place]) Staging form: Cervix Uteri, AJCC Version 9 - Clinical stage from 12/06/2020: FIGO Stage IIB (cT2b, cN0, cM0) - Signed by Lloyd Huger, MD on 12/06/2020 Stage prefix: Initial diagnosis  Lloyd Huger, MD   02/09/2021 8:03 PM

## 2021-02-09 NOTE — Progress Notes (Signed)
Pt has a rash that started on her back Xsunday. Discolored rash on lower back, No pain, No itching. Dr. Baruch Gouty looked at rash during radiation tx yesterday and told pt to speak with Dr.Finn today.

## 2021-02-09 NOTE — Progress Notes (Signed)
After lunch, Vanessa Romero started complaining of nausea and diarrhea. Patient had 4 diarrhea episodes within 15 minutes. Dr. Grayland Ormond made aware. New order for Lomotil entered and given.

## 2021-02-10 ENCOUNTER — Ambulatory Visit
Admission: RE | Admit: 2021-02-10 | Discharge: 2021-02-10 | Disposition: A | Discharge status: Home / Self Care | Payer: Medicare Other | Source: Ambulatory Visit | Attending: Radiation Oncology | Admitting: Radiation Oncology

## 2021-02-10 ENCOUNTER — Ambulatory Visit: Payer: Medicare Other

## 2021-02-10 DIAGNOSIS — Z5111 Encounter for antineoplastic chemotherapy: Secondary | ICD-10-CM | POA: Diagnosis not present

## 2021-02-11 ENCOUNTER — Ambulatory Visit
Admission: RE | Admit: 2021-02-11 | Discharge: 2021-02-11 | Disposition: A | Discharge status: Home / Self Care | Payer: Medicare Other | Source: Ambulatory Visit | Attending: Radiation Oncology | Admitting: Radiation Oncology

## 2021-02-11 ENCOUNTER — Ambulatory Visit: Payer: Medicare Other

## 2021-02-11 ENCOUNTER — Telehealth: Payer: Self-pay | Admitting: *Deleted

## 2021-02-11 DIAGNOSIS — Z5111 Encounter for antineoplastic chemotherapy: Secondary | ICD-10-CM | POA: Diagnosis not present

## 2021-02-11 NOTE — Telephone Encounter (Signed)
Patient called complaining of abdominal and back pain as well as generalized body aches "like when one has the flu except I only have body aches and no nasal congestion or coughing. She states she has tried everything including heating pad with no relief. Please advise

## 2021-02-11 NOTE — Telephone Encounter (Signed)
Call returned to patient and advised per Sharion Dove, NP that she will need to go to ER or Urgent if she wants evaluation today. She agreed to this and thanked me for calling her back

## 2021-02-12 LAB — CULTURE, BLOOD (ROUTINE X 2)
Culture: NO GROWTH
Culture: NO GROWTH
Special Requests: ADEQUATE
Special Requests: ADEQUATE

## 2021-02-14 ENCOUNTER — Ambulatory Visit: Payer: Medicare Other

## 2021-02-15 ENCOUNTER — Ambulatory Visit
Admission: RE | Admit: 2021-02-15 | Discharge: 2021-02-15 | Disposition: A | Discharge status: Home / Self Care | Payer: Medicare Other | Source: Ambulatory Visit | Attending: Radiation Oncology | Admitting: Radiation Oncology

## 2021-02-15 ENCOUNTER — Other Ambulatory Visit: Payer: Self-pay | Admitting: *Deleted

## 2021-02-15 ENCOUNTER — Other Ambulatory Visit: Payer: Self-pay | Admitting: Hospice and Palliative Medicine

## 2021-02-15 ENCOUNTER — Other Ambulatory Visit: Payer: Self-pay | Admitting: Lab

## 2021-02-15 ENCOUNTER — Telehealth: Payer: Self-pay | Admitting: *Deleted

## 2021-02-15 ENCOUNTER — Ambulatory Visit: Payer: Medicare Other

## 2021-02-15 DIAGNOSIS — Z5111 Encounter for antineoplastic chemotherapy: Secondary | ICD-10-CM | POA: Diagnosis not present

## 2021-02-15 DIAGNOSIS — R197 Diarrhea, unspecified: Secondary | ICD-10-CM

## 2021-02-15 LAB — GASTROINTESTINAL PANEL BY PCR, STOOL (REPLACES STOOL CULTURE)

## 2021-02-15 LAB — CLOSTRIDIUM DIFFICILE BY PCR, REFLEXED: Toxigenic C. Difficile by PCR: NEGATIVE

## 2021-02-15 MED ORDER — ALPRAZOLAM 0.5 MG PO TABS: 0.5000 mg | ORAL_TABLET | Freq: Two times a day (BID) | ORAL | 0 refills | Status: DC

## 2021-02-15 MED ORDER — HYDROCODONE-ACETAMINOPHEN 10-325 MG PO TABS: 1.0000 | ORAL_TABLET | Freq: Four times a day (QID) | ORAL | 0 refills | Status: DC | PRN

## 2021-02-15 MED ORDER — FENTANYL 25 MCG/HR TD PT72: 1.0000 | MEDICATED_PATCH | TRANSDERMAL | 0 refills | Status: DC

## 2021-02-15 NOTE — Telephone Encounter (Signed)
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 Informed patient that stool sample would be needed. Pt is in agreement to this, and will be coming to clinic to provide sample. Pt was also advised to try an otc barrier cream such as Desitin, for perineal irritation. She verbalized understanding.

## 2021-02-15 NOTE — Telephone Encounter (Signed)
Patient called reporting that she continues to have diarrhea the consistency of pudding and that it is becoming painful to have bowel movement with a burning sensation. She is taking Lomotil three times a day as prescribed and is asking if there is anything else she can take to stop it or if she is going to have to just put up with it. Please advise

## 2021-02-16 ENCOUNTER — Ambulatory Visit: Payer: Medicare Other

## 2021-02-16 ENCOUNTER — Telehealth: Payer: Self-pay

## 2021-02-16 ENCOUNTER — Other Ambulatory Visit: Payer: Medicare Other

## 2021-02-16 ENCOUNTER — Ambulatory Visit: Payer: Medicare Other | Admitting: Oncology

## 2021-02-16 NOTE — Telephone Encounter (Signed)
Called and informed pt of c-diff pcr results, and suggested retesting. Pt verbalized understanding and stated that she will provide another stool sample when she comes back to clinic on Thursday.

## 2021-02-17 ENCOUNTER — Other Ambulatory Visit: Payer: Self-pay

## 2021-02-17 ENCOUNTER — Ambulatory Visit
Admission: RE | Admit: 2021-02-17 | Discharge: 2021-02-17 | Disposition: A | Discharge status: Home / Self Care | Payer: Medicare Other | Source: Ambulatory Visit | Attending: Radiation Oncology | Admitting: Radiation Oncology

## 2021-02-17 ENCOUNTER — Inpatient Hospital Stay: Payer: Medicare Other

## 2021-02-17 ENCOUNTER — Inpatient Hospital Stay: Payer: Medicare Other | Attending: Radiation Oncology

## 2021-02-17 ENCOUNTER — Telehealth: Payer: Self-pay | Admitting: Oncology

## 2021-02-17 DIAGNOSIS — C539 Malignant neoplasm of cervix uteri, unspecified: Secondary | ICD-10-CM | POA: Insufficient documentation

## 2021-02-17 DIAGNOSIS — Z510 Encounter for antineoplastic radiation therapy: Secondary | ICD-10-CM | POA: Diagnosis not present

## 2021-02-17 DIAGNOSIS — Z5111 Encounter for antineoplastic chemotherapy: Secondary | ICD-10-CM | POA: Diagnosis present

## 2021-02-17 NOTE — Progress Notes (Signed)
See other nutrition note

## 2021-02-17 NOTE — Telephone Encounter (Signed)
Husband called in and wants to talk to the nurse.call back at [phone]

## 2021-02-17 NOTE — Progress Notes (Signed)
Nutrition Follow-up:  Patient with stage IIb cervical cancer on chemotherapy with  carboplatin/taxol and radiation.  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  Hospital admission with c-diff.  Noted ED visit related to brachytherapy treatment yesterday at [facility].   Met with patient and husband following final day of radiation.  Patient reports that her appetite is about the same.  She is drinking about 3 [employer] shake per day. Likes the 1.4 calorie shake as well as 1.0.  Trying to eat solid foods as well.  Having loose stool and planning to getting specimen cup today in clinic for testing.  Says that she has diarrhea no matter what she eats.  Taking lomotil.      Medications: reviewed  Labs: reviewed  Anthropometrics:   Weight 126 lb 8 oz 128 lb on 1/18 130 lb 1.6 oz on 11/16   NUTRITION DIAGNOSIS: Inadequate oral intake continues    INTERVENTION:  Continue [employer] shakes 1.4 (higher calorie) at least 3 times per day Continue good sources of protein    MONITORING, EVALUATION, GOAL: weight trends, intake   NEXT VISIT: Wed, Feb 22 after MD visit  Lunette Tapp B. Zenia Resides, [place], [place] Registered Dietitian [phone] (mobile)

## 2021-02-17 NOTE — Telephone Encounter (Signed)
Call returned. Concerns addressed.

## 2021-02-19 LAB — STOOL CULTURE: E coli, Shiga toxin Assay: NEGATIVE

## 2021-02-19 LAB — STOOL CULTURE REFLEX - RSASHR

## 2021-02-19 LAB — STOOL CULTURE REFLEX - CMPCXR

## 2021-02-26 ENCOUNTER — Emergency Department: Payer: Medicare Other

## 2021-02-26 ENCOUNTER — Other Ambulatory Visit: Payer: Self-pay

## 2021-02-26 ENCOUNTER — Observation Stay
Admission: EM | Admit: 2021-02-26 | Discharge: 2021-02-26 | Disposition: A | Discharge status: Other Acute Inpatient Hospital | Payer: Medicare Other | Attending: Internal Medicine | Admitting: Internal Medicine

## 2021-02-26 ENCOUNTER — Encounter: Payer: Self-pay | Admitting: Emergency Medicine

## 2021-02-26 DIAGNOSIS — D649 Anemia, unspecified: Secondary | ICD-10-CM

## 2021-02-26 DIAGNOSIS — R509 Fever, unspecified: Secondary | ICD-10-CM | POA: Diagnosis present

## 2021-02-26 DIAGNOSIS — Z20822 Contact with and (suspected) exposure to covid-19: Secondary | ICD-10-CM | POA: Insufficient documentation

## 2021-02-26 DIAGNOSIS — C539 Malignant neoplasm of cervix uteri, unspecified: Secondary | ICD-10-CM

## 2021-02-26 DIAGNOSIS — R52 Pain, unspecified: Secondary | ICD-10-CM

## 2021-02-26 DIAGNOSIS — F418 Other specified anxiety disorders: Secondary | ICD-10-CM

## 2021-02-26 DIAGNOSIS — N7011 Chronic salpingitis: Principal | ICD-10-CM | POA: Diagnosis present

## 2021-02-26 DIAGNOSIS — I10 Essential (primary) hypertension: Secondary | ICD-10-CM | POA: Diagnosis not present

## 2021-02-26 DIAGNOSIS — F319 Bipolar disorder, unspecified: Secondary | ICD-10-CM | POA: Diagnosis present

## 2021-02-26 DIAGNOSIS — R1030 Lower abdominal pain, unspecified: Secondary | ICD-10-CM

## 2021-02-26 DIAGNOSIS — R109 Unspecified abdominal pain: Secondary | ICD-10-CM | POA: Diagnosis present

## 2021-02-26 LAB — URINALYSIS, ROUTINE W REFLEX MICROSCOPIC
Bacteria, UA: NONE SEEN
Bilirubin Urine: NEGATIVE
Glucose, UA: NEGATIVE mg/dL
Hgb urine dipstick: NEGATIVE
Ketones, ur: NEGATIVE mg/dL
Nitrite: NEGATIVE
Protein, ur: NEGATIVE mg/dL
Specific Gravity, Urine: 1.014 (ref 1.005–1.030)
pH: 6 (ref 5.0–8.0)

## 2021-02-26 LAB — COMPREHENSIVE METABOLIC PANEL
ALT: 14 U/L (ref 0–44)
AST: 22 U/L (ref 15–41)
Albumin: 3.3 g/dL — ABNORMAL LOW (ref 3.5–5.0)
Alkaline Phosphatase: 55 U/L (ref 38–126)
Anion gap: 8 (ref 5–15)
BUN: 11 mg/dL (ref 6–20)
CO2: 24 mmol/L (ref 22–32)
Calcium: 8.6 mg/dL — ABNORMAL LOW (ref 8.9–10.3)
Chloride: 99 mmol/L (ref 98–111)
Creatinine, Ser: 0.64 mg/dL (ref 0.44–1.00)
GFR, Estimated: >60 mL/min (ref >60)
Glucose, Bld: 151 mg/dL — ABNORMAL HIGH (ref 70–99)
Potassium: 3.6 mmol/L (ref 3.5–5.1)
Sodium: 131 mmol/L — ABNORMAL LOW (ref 135–145)
Total Bilirubin: 0.3 mg/dL (ref 0.3–1.2)
Total Protein: 6.4 g/dL — ABNORMAL LOW (ref 6.5–8.1)

## 2021-02-26 LAB — CBC
HCT: 34.5 % — ABNORMAL LOW (ref 36.0–46.0)
Hemoglobin: 11 g/dL — ABNORMAL LOW (ref 12.0–15.0)
MCH: 27.8 pg (ref 26.0–34.0)
MCHC: 31.9 g/dL (ref 30.0–36.0)
MCV: 87.1 fL (ref 80.0–100.0)
Platelets: 246 10*3/uL (ref 150–400)
RBC: 3.96 MIL/uL (ref 3.87–5.11)
RDW: 16.5 % — ABNORMAL HIGH (ref 11.5–15.5)
WBC: 3.9 10*3/uL — ABNORMAL LOW (ref 4.0–10.5)
nRBC: 0 % (ref 0.0–0.2)

## 2021-02-26 LAB — LACTIC ACID, PLASMA
Lactic Acid, Venous: 2.7 mmol/L (ref 0.5–1.9)
Lactic Acid, Venous: 1.2 mmol/L (ref 0.5–1.9)

## 2021-02-26 LAB — RESP PANEL BY RT-PCR (FLU A&B, COVID) ARPGX2
Influenza A by PCR: NEGATIVE
Influenza B by PCR: NEGATIVE
SARS Coronavirus 2 by RT PCR: NEGATIVE

## 2021-02-26 LAB — LIPASE, BLOOD: Lipase: 30 U/L (ref 11–51)

## 2021-02-26 MED ORDER — DIPHENHYDRAMINE HCL 50 MG/ML IJ SOLN
50.0000 mg | Freq: Once | INTRAMUSCULAR | Status: AC
  Administered 2021-02-26: 50 mg via INTRAVENOUS
  Filled 2021-02-26: qty 1

## 2021-02-26 MED ORDER — HYDROCORTISONE SOD SUC (PF) 250 MG IJ SOLR
200.0000 mg | Freq: Once | INTRAMUSCULAR | Status: DC
  Filled 2021-02-26: qty 200

## 2021-02-26 MED ORDER — HYDROMORPHONE HCL 1 MG/ML IJ SOLN
1.0000 mg | Freq: Once | INTRAMUSCULAR | Status: AC
Start: 2021-02-26 — End: 2021-02-26
  Administered 2021-02-26: 1 mg via INTRAVENOUS
  Filled 2021-02-26: qty 1

## 2021-02-26 MED ORDER — SODIUM CHLORIDE 0.9 % IV BOLUS
1000.0000 mL | Freq: Once | INTRAVENOUS | Status: AC
  Administered 2021-02-26: 1000 mL via INTRAVENOUS

## 2021-02-26 MED ORDER — KETAMINE HCL 50 MG/5ML IJ SOSY
0.3000 mg/kg | PREFILLED_SYRINGE | Freq: Once | INTRAMUSCULAR | Status: AC
  Administered 2021-02-26: 16 mg via INTRAVENOUS
  Filled 2021-02-26: qty 5

## 2021-02-26 MED ORDER — METHYLPREDNISOLONE SODIUM SUCC 125 MG IJ SOLR
125.0000 mg | INTRAMUSCULAR | Status: AC
  Administered 2021-02-26: 125 mg via INTRAVENOUS
  Filled 2021-02-26: qty 2

## 2021-02-26 MED ORDER — MORPHINE SULFATE (PF) 4 MG/ML IV SOLN
4.0000 mg | Freq: Once | INTRAVENOUS | Status: AC
  Administered 2021-02-26: 4 mg via INTRAVENOUS
  Filled 2021-02-26: qty 1

## 2021-02-26 MED ORDER — ONDANSETRON HCL 4 MG/2ML IJ SOLN
4.0000 mg | Freq: Once | INTRAMUSCULAR | Status: AC
  Administered 2021-02-26: 4 mg via INTRAVENOUS
  Filled 2021-02-26: qty 2

## 2021-02-26 MED ORDER — SODIUM CHLORIDE 0.9 % IV SOLN
2.0000 g | Freq: Once | INTRAVENOUS | Status: AC
  Administered 2021-02-26: 2 g via INTRAVENOUS
  Filled 2021-02-26: qty 20

## 2021-02-26 MED ORDER — IOHEXOL 300 MG/ML  SOLN
75.0000 mL | Freq: Once | INTRAMUSCULAR | Status: AC | PRN
  Administered 2021-02-26: 75 mL via INTRAVENOUS

## 2021-02-26 MED ORDER — DIPHENHYDRAMINE HCL 25 MG PO CAPS: 50.0000 mg | ORAL_CAPSULE | Freq: Once | ORAL | Status: AC

## 2021-02-26 MED ORDER — HYDROMORPHONE HCL 1 MG/ML IJ SOLN
1.0000 mg | Freq: Once | INTRAMUSCULAR | Status: AC
  Administered 2021-02-26: 1 mg via INTRAVENOUS
  Filled 2021-02-26: qty 1

## 2021-02-26 MED ORDER — KETOROLAC TROMETHAMINE 30 MG/ML IJ SOLN
15.0000 mg | INTRAMUSCULAR | Status: AC
  Administered 2021-02-26: 15 mg via INTRAVENOUS
  Filled 2021-02-26: qty 1

## 2021-02-26 MED ORDER — METRONIDAZOLE 500 MG/100ML IV SOLN
500.0000 mg | Freq: Once | INTRAVENOUS | Status: AC
  Administered 2021-02-26: 500 mg via INTRAVENOUS
  Filled 2021-02-26: qty 100

## 2021-02-26 NOTE — ED Notes (Signed)
Patient transported to CT 

## 2021-02-26 NOTE — ED Triage Notes (Signed)
Pt via POV from home. Pt c/o fever, states it was 102.1 this AM, lower abd pain, and constipation since yesterday. Pt last BM was 2 days ago. Pt has hx of cervical cancer and is on active radiation. Pt 2 325mg  of Tylenol at 0730 and Oxycodone at 0430. Pt is A&Ox4 and NAD>

## 2021-02-26 NOTE — ED Provider Notes (Signed)
----------------------------------------- °  4:41 PM on 02/26/2021 ----------------------------------------- Patient care assumed from Dr. Joni Fears.  I spoke to [facility] regarding this patient, they had initially asked that we reconsult our hospitalist and GYN team for a local admission.  I spoke to the hospitalist who wanted GYN in agreement before admission.  I spoke to Dr. Nechama Guard of gynecology, who once again states she would prefer the patient return back to [facility] as it is a brachytherapy patient.  I spoke to [facility] once again and they have agreed to accept the patient to their service.  We are currently awaiting a bed.  Patient continues to have significant pain after multiple rounds of pain medication in the emergency department.   Harvest Dark, MD 02/26/21 [phone]

## 2021-02-26 NOTE — ED Notes (Signed)
[facility] to transport to [facility]

## 2021-02-26 NOTE — ED Provider Notes (Signed)
[facility] Provider Note    Event Date/Time   First MD Initiated Contact with Patient 02/26/21 [phone]     (approximate)   History   Fever and Abdominal Pain   HPI  Vanessa Romero is a 46 y.o. female who comes ED complaining of pelvic pain and constipation since radiation therapy treatment yesterday.  She tried taking oxycodone and Tylenol at home without relief.  Also reports a fever of 102.1 at home.  No chest pain or shortness of breath.  No diarrhea or dysuria or vomiting.  She reports the pain is typical after a radiation treatment, but the fever is unusual.     Physical Exam   Triage Vital Signs: ED Triage Vitals  Enc Vitals Group     BP 02/26/21 0729 (!) 130/91     Pulse Rate 02/26/21 0729 80     Resp 02/26/21 0729 16     Temp 02/26/21 0729 98.5 F (36.9 C)     Temp Source 02/26/21 0729 Oral     SpO2 02/26/21 0729 99 %     Weight 02/26/21 0731 115 lb (52.2 kg)     Height 02/26/21 0731 5\' 7"  (1.702 m)     Head Circumference --      Peak Flow --      Pain Score 02/26/21 0730 8     Pain Loc --      Pain Edu? --      Excl. in [place]? --     Most recent vital signs: Vitals:   02/26/21 1942 02/26/21 1945  BP: 120/75 120/75  Pulse: 70 78  Resp: 16   Temp: 98.8 F (37.1 C)   SpO2: 98% 96%     General: Awake, no distress.  CV:  Good peripheral perfusion.  Regular rate and rhythm Resp:  Normal effort.  No tachypnea Abd:  No distention.  Soft with mild generalized tenderness, worse in the suprapubic area Other:  No lower extremity edema.  Somewhat dry mucous membranes.   ED Results / Procedures / Treatments   Labs (all labs ordered are listed, but only abnormal results are displayed) Labs Reviewed  COMPREHENSIVE METABOLIC PANEL - Abnormal; Notable for the following components:      Result Value   Sodium 131 (*)    Glucose, Bld 151 (*)    Calcium 8.6 (*)    Total Protein 6.4 (*)    Albumin 3.3 (*)    All other components  within normal limits  CBC - Abnormal; Notable for the following components:   WBC 3.9 (*)    Hemoglobin 11.0 (*)    HCT 34.5 (*)    RDW 16.5 (*)    All other components within normal limits  URINALYSIS, ROUTINE W REFLEX MICROSCOPIC - Abnormal; Notable for the following components:   Color, Urine YELLOW (*)    APPearance CLEAR (*)    Leukocytes,Ua SMALL (*)    All other components within normal limits  LACTIC ACID, PLASMA - Abnormal; Notable for the following components:   Lactic Acid, Venous 2.7 (*)    All other components within normal limits  RESP PANEL BY RT-PCR (FLU A&B, COVID) ARPGX2  LIPASE, BLOOD  LACTIC ACID, PLASMA  POC URINE PREG, ED     EKG     RADIOLOGY CT abdomen pelvis reviewed by me, no signs of bowel obstruction or free air.  Radiology report reviewed noting possible hydrosalpinx.    PROCEDURES:  Critical Care performed: No  Procedures  MEDICATIONS ORDERED IN ED: Medications  sodium chloride 0.9 % bolus 1,000 mL (0 mLs Intravenous Stopped 02/26/21 1028)  morphine (PF) 4 MG/ML injection 4 mg (4 mg Intravenous Given 02/26/21 0858)  ondansetron (ZOFRAN) injection 4 mg (4 mg Intravenous Given 02/26/21 0857)  diphenhydrAMINE (BENADRYL) capsule 50 mg ( Oral See Alternative 02/26/21 1215)    Or  diphenhydrAMINE (BENADRYL) injection 50 mg (50 mg Intravenous Given 02/26/21 1215)  methylPREDNISolone sodium succinate (SOLU-MEDROL) 125 mg/2 mL injection 125 mg (125 mg Intravenous Given 02/26/21 0911)  HYDROmorphone (DILAUDID) injection 1 mg (1 mg Intravenous Given 02/26/21 0921)  HYDROmorphone (DILAUDID) injection 1 mg (1 mg Intravenous Given 02/26/21 1026)  ketamine 50 mg in normal saline 5 mL (10 mg/mL) syringe (16 mg Intravenous Given 02/26/21 1129)  iohexol (OMNIPAQUE) 300 MG/ML solution 75 mL (75 mLs Intravenous Contrast Given 02/26/21 1311)  HYDROmorphone (DILAUDID) injection 1 mg (1 mg Intravenous Given 02/26/21 1447)  ketorolac (TORADOL) 30 MG/ML injection 15 mg  (15 mg Intravenous Given 02/26/21 1607)  ketamine 50 mg in normal saline 5 mL (10 mg/mL) syringe (16 mg Intravenous Given 02/26/21 1608)  cefTRIAXone (ROCEPHIN) 2 g in sodium chloride 0.9 % 100 mL IVPB (0 g Intravenous Stopped 02/26/21 1639)  metroNIDAZOLE (FLAGYL) IVPB 500 mg (0 mg Intravenous Stopped 02/26/21 1928)     IMPRESSION / MDM / ASSESSMENT AND PLAN / ED COURSE  I reviewed the triage vital signs and the nursing notes.                              Differential diagnosis includes, but is not limited to, pelvic abscess, bowel perforation, bowel obstruction, hernia cholecystitis    Patient presents with pelvic pain and fever in the setting of regional radiation therapy for cervical cancer.  Will obtain CT scan to further evaluate.  Patient has had minimal pain relief after IV morphine and 2 doses of IV Dilaudid.  Therefore, we will try analgesic dose IV ketamine.      FINAL CLINICAL IMPRESSION(S) / ED DIAGNOSES   Final diagnoses:  Intractable pain  Hydrosalpinx  Malignant neoplasm of cervix, unspecified site [facility])     Rx / DC Orders   ED Discharge Orders     None        Note:  This document was prepared using Dragon voice recognition software and may include unintentional dictation errors.   Carrie Mew, MD 03/01/21 [phone]

## 2021-02-26 NOTE — Consult Note (Signed)
Medical Consultation   Vanessa Romero  MOQ:[id number]  DOB: Dec 03, 1975  DOA: 02/26/2021  PCP: Anselmo Pickler, MD   Outpatient Specialists:    Requesting physician: -Dr. Joni Fears   Reason for consultation: -abdominal pain   History of Present Illness: Vanessa Romero is an 46 y.o. female with PMH of cervical cancer on active radiation therapy, hypertension, alcohol abuse in remission, former smoker, anemia, chronic pain, depression with anxiety, bipolar, idiopathic intracranial hypertension, C. difficile colitis, small bowel obstruction, GI bleeding, migraine headache, s/p of Roux-en-Y gastric bypass, who presents with abdominal pain.   Patient states that she is currently getting radiation therapy for cervical cancer at the [facility].  She had treatment of high-dose rate brachytherapy to the cervix (treatment 3 of planned 3) yesterday in [place]. She states that she started having abdominal pain since yesterday, which is located in lower abdomen, severe, 10 out of 10 in severity, constant, intractable, sharp, involving whole pelvis.  Associated with nausea and 2 times of nonbilious nonbloody vomiting.  No diarrhea.  Patient stated that she is constipated.  Last bowel movement was 2 days ago. Patient denies chest pain, cough, shortness of breath.  No symptoms of UTI.  Patient states that she had fever 102.0 at home, but her temperature is 98.5 in ED.    Data Reviewed and ED Course: pt was found to have   WBC 3.9, pending COVID PCR, electrolytes renal function okay, temperature normal, blood pressure 134/82, heart rate 82, RR 17, oxygen saturation 98% on room air.  CT-abd/pelvis 1. Signs of gastric bypass procedure with mild distension of the excluded stomach that is a chronic finding. 2. Signs of enteritis, improved involving the ileum in the pelvis and likely related to prior radiation, likely associated with proctitis as well, also improved compared to imaging from  December of 2022. 3. Serpiginous fluid-filled structure along the RIGHT parametrium likely dilated fallopian tube based on appearance. This may represent hydrosalpinx. Would correlate with any signs of infection. Only small increase in distension in this area is noted since December of 2022. 4. Stable biliary duct distension following cholecystectomy. 5. Aortic atherosclerosis.   Date reviewed, lab, image and vitals: WBC 3.9, pending COVID PCR, lipase 30, negative urinalysis, lactic acid 2.7, 1.2, GFR > 60, temperature normal  EKG: Not done in ED yet, will get one.  Review of Systems:   General: no fevers, chills, no changes in body weight, no changes in appetite Skin: no rash HEENT: no blurry vision, hearing changes or sore throat Pulm: no dyspnea, coughing, wheezing CV: no chest pain, palpitations, shortness of breath Abd: has nausea/vomiting, abdominal pain, no diarrhea. has constipation GU: no dysuria, hematuria, polyuria Ext: no arthralgias, myalgias Neuro: no weakness, numbness, or tingling    Past Medical History: Past Medical History:  Diagnosis Date   Alcohol abuse    Anemia    Cancer ([place])    Tobacco dependence     Past Surgical History: Past Surgical History:  Procedure Laterality Date   ABDOMINAL ADHESION SURGERY     bowel obstruction     x2   CERVICAL CONIZATION W/BX N/A 11/26/2020   Procedure: CONIZATION CERVIX WITH BIOPSY;  Surgeon: Malachy Mood, MD;  Location: [facility] ORS;  Service: Gynecology;  Laterality: N/A;   ESOPHAGOGASTRODUODENOSCOPY N/A 11/24/2020   Procedure: ESOPHAGOGASTRODUODENOSCOPY (EGD);  Surgeon: Lin Landsman, MD;  Location: [facility] ENDOSCOPY;  Service: Gastroenterology;  Laterality: N/A;   laparoscopic  knee surgery     PORTA CATH INSERTION N/A 12/20/2020   Procedure: PORTA CATH INSERTION;  Surgeon: Algernon Huxley, MD;  Location: [facility] LAB;  Service: Cardiovascular;  Laterality: N/A;   ROUX-EN-Y GASTRIC BYPASS     TEAR  DUCT PROBING     unclogg   VAGOTOMY     VENTRICULOPERITONEAL SHUNT     x6 put in and removals     Allergies:   Allergies  Allergen Reactions   Contrast Media [Iodinated Contrast Media] Hives   Gabapentin Other (See Comments), Rash and Palpitations    Other Reaction: tachycardia Other Reaction: tachycardia    Morphine Dermatitis, Hives, Rash, Swelling and Other (See Comments)    Other reaction(s): Unknown (comments) Has tolerated hydromorphone (Dilaudid) Immediate after injections arm edema and arm turned bright red Immediate after injections arm edema and arm turned bright red IV Morphine IV Morphine    Sumatriptan Dermatitis, Hives, Itching, Other (See Comments) and Swelling    Other reaction(s): Joint Pain, Other (See Comments), Other (see comments), Unknown (comments) lock jaw Lock jaw Lock jaw Lock jaw TIGHTENING OF JAW Lock jaw lock jaw Lock jaw TIGHTENING OF JAW Lock jaw    Zolpidem Nausea And Vomiting and Other (See Comments)    Other reaction(s): Other (see comments) sleep walking sleep walking Sleep walking  don't tolerate it well    Erythromycin Diarrhea, Nausea And Vomiting and Nausea Only    Extreme upset stomach    Valproic Acid Rash    Other reaction(s): Other (see comments), Unknown MOOD DISORDER MOOD DISORDER Depakote: Reaction unknown     Acetazolamide     Other reaction(s): Unknown (comments)   Erythromycin Base     Other reaction(s): UNKNOWN   Amoxicillin Rash   Divalproex Sodium Anxiety and Other (See Comments)     Social History:  reports that she has quit smoking. Her smoking use included cigarettes. She has quit using smokeless tobacco. She reports that she does not currently use alcohol. She reports that she does not currently use drugs.  Family History: Family History  Problem Relation Age of Onset   Cancer Mother    Cancer Father    Cancer Maternal Grandmother      Physical Exam: Vitals:   02/26/21 1100 02/26/21  1130 02/26/21 1230 02/26/21 1400  BP: (!) 119/103 (!) 130/92 126/80 134/82  Pulse: 75 79 82 65  Resp:  17 16 17   Temp:      TempSrc:      SpO2: 97% 100% 98% 99%  Weight:      Height:         General: Not in acute distress HEENT:       Eyes: PERRL, EOMI, no scleral icterus.       ENT: No discharge from the ears and nose, no pharynx injection, no tonsillar enlargement.        Neck: No JVD, no bruit, no mass felt. Heme: No neck lymph node enlargement. Cardiac: S1/S2, RRR, No murmurs, No gallops or rubs. Respiratory: No rales, wheezing, rhonchi or rubs. GI: Soft, nondistended, has lower abdominal tenderness, no rebound pain, no organomegaly, BS present. GU: No hematuria Ext: No pitting leg edema bilaterally. 1+DP/PT pulse bilaterally. Musculoskeletal: No joint deformities, No joint redness or warmth, no limitation of ROM in spin. Skin: No rashes.  Neuro: Alert, oriented X3, cranial nerves II-XII grossly intact, moves all extremities normally. Psych: Patient is not psychotic, no suicidal or hemocidal ideation.    Data reviewed:  I have personally reviewed following labs and imaging studies Labs:  CBC: Recent Labs  Lab 02/26/21 0742  WBC 3.9*  HGB 11.0*  HCT 34.5*  MCV 87.1  PLT 277    Basic Metabolic Panel: Recent Labs  Lab 02/26/21 0742  NA 131*  K 3.6  CL 99  CO2 24  GLUCOSE 151*  BUN 11  CREATININE 0.64  CALCIUM 8.6*   GFR Estimated Creatinine Clearance: 74 mL/min (by C-G formula based on SCr of 0.64 mg/dL). Liver Function Tests: Recent Labs  Lab 02/26/21 0742  AST 22  ALT 14  ALKPHOS 55  BILITOT 0.3  PROT 6.4*  ALBUMIN 3.3*   Recent Labs  Lab 02/26/21 0742  LIPASE 30   No results for input(s): AMMONIA in the last 168 hours. Coagulation profile No results for input(s): INR, PROTIME in the last 168 hours.  Cardiac Enzymes: No results for input(s): CKTOTAL, CKMB, CKMBINDEX, TROPONINI in the last 168 hours. BNP: Invalid input(s):  POCBNP CBG: No results for input(s): GLUCAP in the last 168 hours. D-Dimer No results for input(s): DDIMER in the last 72 hours. Hgb A1c No results for input(s): HGBA1C in the last 72 hours. Lipid Profile No results for input(s): CHOL, HDL, LDLCALC, TRIG, CHOLHDL, LDLDIRECT in the last 72 hours. Thyroid function studies No results for input(s): TSH, T4TOTAL, T3FREE, THYROIDAB in the last 72 hours.  Invalid input(s): FREET3 Anemia work up No results for input(s): VITAMINB12, FOLATE, FERRITIN, TIBC, IRON, RETICCTPCT in the last 72 hours. Urinalysis    Component Value Date/Time   COLORURINE YELLOW (A) 02/26/2021 0742   APPEARANCEUR CLEAR (A) 02/26/2021 0742   LABSPEC 1.014 02/26/2021 0742   PHURINE 6.0 02/26/2021 0742   GLUCOSEU NEGATIVE 02/26/2021 0742   HGBUR NEGATIVE 02/26/2021 0742   BILIRUBINUR NEGATIVE 02/26/2021 0742   KETONESUR NEGATIVE 02/26/2021 0742   PROTEINUR NEGATIVE 02/26/2021 0742   NITRITE NEGATIVE 02/26/2021 0742   LEUKOCYTESUR SMALL (A) 02/26/2021 0742     Microbiology Recent Results (from the past 240 hour(s))  Resp Panel by RT-PCR (Flu A&B, Covid) Nasopharyngeal Swab     Status: None   Collection Time: 02/26/21  3:08 PM   Specimen: Nasopharyngeal Swab; Nasopharyngeal(NP) swabs in vial transport medium  Result Value Ref Range Status   SARS Coronavirus 2 by RT PCR NEGATIVE NEGATIVE Final    Comment: (NOTE) SARS-CoV-2 target nucleic acids are NOT DETECTED.  The SARS-CoV-2 RNA is generally detectable in upper respiratory specimens during the acute phase of infection. The lowest concentration of SARS-CoV-2 viral copies this assay can detect is 138 copies/mL. A negative result does not preclude SARS-Cov-2 infection and should not be used as the sole basis for treatment or other patient management decisions. A negative result may occur with  improper specimen collection/handling, submission of specimen other than nasopharyngeal swab, presence of viral  mutation(s) within the areas targeted by this assay, and inadequate number of viral copies(<138 copies/mL). A negative result must be combined with clinical observations, patient history, and epidemiological information. The expected result is Negative.  Fact Sheet for Patients:  EntrepreneurPulse.com.au  Fact Sheet for Healthcare Providers:  IncredibleEmployment.be  This test is no t yet approved or cleared by the [place] FDA and  has been authorized for detection and/or diagnosis of SARS-CoV-2 by FDA under an Emergency Use Authorization (EUA). This EUA will remain  in effect (meaning this test can be used) for the duration of the COVID-19 declaration under Section 564(b)(1) of the Act, 21 U.S.C.section 360bbb-3(b)(1), unless  the authorization is terminated  or revoked sooner.       Influenza A by PCR NEGATIVE NEGATIVE Final   Influenza B by PCR NEGATIVE NEGATIVE Final    Comment: (NOTE) The Xpert Xpress SARS-CoV-2/FLU/RSV plus assay is intended as an aid in the diagnosis of influenza from Nasopharyngeal swab specimens and should not be used as a sole basis for treatment. Nasal washings and aspirates are unacceptable for Xpert Xpress SARS-CoV-2/FLU/RSV testing.  Fact Sheet for Patients: EntrepreneurPulse.com.au  Fact Sheet for Healthcare Providers: IncredibleEmployment.be  This test is not yet approved or cleared by the [place] FDA and has been authorized for detection and/or diagnosis of SARS-CoV-2 by FDA under an Emergency Use Authorization (EUA). This EUA will remain in effect (meaning this test can be used) for the duration of the COVID-19 declaration under Section 564(b)(1) of the Act, 21 U.S.C. section 360bbb-3(b)(1), unless the authorization is terminated or revoked.  Performed at [facility], [address] [place], [place] [address]        Inpatient Medications:    Scheduled Meds:   Continuous Infusions:  metronidazole       Radiological Exams on Admission: CT ABDOMEN PELVIS W CONTRAST  Result Date: 02/26/2021 CLINICAL DATA:  A 46 year old female arrives with fever and suspected peritonitis/perforation. EXAM: CT ABDOMEN AND PELVIS WITH CONTRAST TECHNIQUE: Multidetector CT imaging of the abdomen and pelvis was performed using the standard protocol following bolus administration of intravenous contrast. RADIATION DOSE REDUCTION: This exam was performed according to the departmental dose-optimization program which includes automated exposure control, adjustment of the mA and/or kV according to patient size and/or use of iterative reconstruction technique. CONTRAST:  62mL OMNIPAQUE IOHEXOL 300 MG/ML  SOLN COMPARISON:  February 04, 2021. FINDINGS: Lower chest: Incidental imaging of the lung bases without sign of effusion or evidence of consolidative process. Hepatobiliary: Liver with smooth contours and without visible lesion. Biliary duct distension following cholecystectomy is a stable finding dating back to May of 2020. Portal vein is patent. Hepatic veins are patent. Pancreas: Normal, without mass, inflammation or ductal dilatation. Spleen: Normal. Adrenals/Urinary Tract: Adrenal glands are unremarkable. Symmetric renal enhancement. No sign of hydronephrosis. No suspicious renal lesion or perinephric stranding. Urinary bladder is grossly unremarkable. Stomach/Bowel: Signs of gastric bypass procedure. Mild distension of the excluded stomach. This is less distended than on the previous exam from February 04, 2021 displaying similar distension to the study of 01-13-2021. Roux limb passes anterior to the distal transverse colon. No signs of mesenteric distortion or small bowel obstruction. No perigastric stranding. Mild ileal thickening is less pronounced than on imaging from Jan 13, 2021. Miles surrounding stranding about the ileum similar to recent imaging.  The appendix not visualized and is likely surgically absent based on the appearance of the cecum with suture line about the cecum. No pericolonic stranding about the proximal colon. Signs of mild rectal thickening which is not as pronounced as on imaging from Jan 13, 2021. Vascular/Lymphatic: Aortic atherosclerosis. No sign of aneurysm. Smooth contour of the IVC. There is no gastrohepatic or hepatoduodenal ligament lymphadenopathy. No retroperitoneal or mesenteric lymphadenopathy. No pelvic sidewall lymphadenopathy. Atherosclerotic changes are mild. Reproductive: Hyperenhancing uterus which is similar to the study of 13-Jan-2021. Signs of arcuate uterus. Serpiginous RIGHT parametrial structure likely distended fallopian tube with some surrounding stranding. This has been present since Jan 13, 2021 and shows slight increased distension at 2.5 cm as compared to 2.2 cm greatest width on coronal images. Other: No pneumoperitoneum.  No substantial ascites. Trace fluid in the pelvis. Musculoskeletal: No acute or significant osseous findings. IMPRESSION: 1. Signs of gastric bypass procedure with mild distension of the excluded stomach that is a chronic finding. 2. Signs of enteritis, improved involving the ileum in the pelvis and likely related to prior radiation, likely associated with proctitis as well, also improved compared to imaging from December of 2022. 3. Serpiginous fluid-filled structure along the RIGHT parametrium likely dilated fallopian tube based on appearance. This may represent hydrosalpinx. Would correlate with any signs of infection. Only small increase in distension in this area is noted since December of 2022. 4. Stable biliary duct distension following cholecystectomy. 5. Aortic atherosclerosis. Aortic Atherosclerosis (ICD10-I70.0). Electronically Signed   By: Zetta Bills M.D.   On: 02/26/2021 13:57    Impression/Recommendations Principal Problem:   Abdominal pain Active  Problems:   Cervical cancer, FIGO stage IIB (HCC)   HTN (hypertension)   Depression with anxiety   Bipolar disorder (HCC)   Normocytic anemia   Abdominal pain: Etiology is not clear. CT scan showed right hydrosalpinx, and possible radiation related enteritis and proctitis.  Another differential diagnosis is internal hernia since patient had history of Roux-en-Y gastric bypass surgery, but patient's abdominal pain is located in the lower abdomen, low suspicion for internal hernia. Since pt is currently receiving brachytherapy in [facility], I recommended to transfer patient back to [facility] for the best care.  ED physician also consulted gynecology, Dr. Nechama Guard who also recommended to transfer patient back to [facility]. EDP made tremendous effort in explaining the necessity of transfer, finally patient is accepted by [facility].  Patient reports subjective fever at home, but currently patient's temperature is normal. -Waiting for transfer -Pain management per EDP  Cervical cancer, FIGO stage IIB ([place]) -continue treatment in [facility]  HTN (hypertension): Bp 107/75 --> 134/82. Pt is not taking Bp med now -monitoring Bp closely  Depression with anxiety, Bipolar disorder ([place]) -continue home meds: Xanax, Lexapro, Lamictal,  Normocytic anemia: Hemoglobin stable 11.0 -Follow-up with with CBC    Thank you for this consultation.  Our [facility] hospitalist team will follow the patient with you.  Time Spent:  35 min     Ivor Costa M.D. [facility] 02/26/2021, 5:08 PM

## 2021-03-07 ENCOUNTER — Inpatient Hospital Stay: Payer: Medicare Other | Admitting: Hospice and Palliative Medicine

## 2021-03-07 NOTE — Progress Notes (Unsigned)
[place]  [phone] Fax:(336) [phone]  ID: Vanessa Romero OB: 06/02/1975  MR#: [id number]  RWE#:[id number]  Patient Care Team: Anselmo Pickler, MD as PCP - General (Internal Medicine)  CHIEF COMPLAINT: Stage IIb adenocarcinoma of the cervix.  INTERVAL HISTORY: Patient returns to clinic today for further evaluation and consideration of cycle 5 of carboplatin and Taxol.  She is once again seen in the emergency room for increased diarrhea, but then eloped prior to work-up being completed.  Her diarrhea has resolved and she feels nearly back to her baseline.  She continues to have significant weakness and fatigue. She has no neurologic complaints.  She denies any recent fevers or illnesses.  She has a good appetite and denies weight loss. She has no chest pain, shortness of breath, cough, or hemoptysis.  She denies any nausea, vomiting, or constipation.  She does not complain of abdominal pain today.  She has no urinary complaints.  Patient offers no further specific complaints today.  REVIEW OF SYSTEMS:   Review of Systems  Constitutional:  Positive for malaise/fatigue. Negative for fever and weight loss.  Respiratory: Negative.  Negative for cough, hemoptysis and shortness of breath.   Cardiovascular: Negative.  Negative for chest pain and leg swelling.  Gastrointestinal:  Positive for diarrhea. Negative for abdominal pain.  Genitourinary: Negative.  Negative for dysuria.  Musculoskeletal:  Negative for back pain.  Skin: Negative.  Negative for rash.  Neurological:  Positive for weakness. Negative for dizziness, focal weakness and headaches.  Psychiatric/Behavioral: Negative.  The patient is not nervous/anxious.    As per HPI. Otherwise, a complete review of systems is negative.  PAST MEDICAL HISTORY: Past Medical History:  Diagnosis Date   Alcohol abuse    Anemia    Cancer ([place])    Tobacco dependence     PAST SURGICAL HISTORY: Past  Surgical History:  Procedure Laterality Date   ABDOMINAL ADHESION SURGERY     bowel obstruction     x2   CERVICAL CONIZATION W/BX N/A 11/26/2020   Procedure: CONIZATION CERVIX WITH BIOPSY;  Surgeon: Malachy Mood, MD;  Location: [facility] ORS;  Service: Gynecology;  Laterality: N/A;   ESOPHAGOGASTRODUODENOSCOPY N/A 11/24/2020   Procedure: ESOPHAGOGASTRODUODENOSCOPY (EGD);  Surgeon: Lin Landsman, MD;  Location: [facility] ENDOSCOPY;  Service: Gastroenterology;  Laterality: N/A;   laparoscopic knee surgery     PORTA CATH INSERTION N/A 12/20/2020   Procedure: PORTA CATH INSERTION;  Surgeon: Algernon Huxley, MD;  Location: [place] [facility];  Service: Cardiovascular;  Laterality: N/A;   ROUX-EN-Y GASTRIC BYPASS     TEAR DUCT PROBING     unclogg   VAGOTOMY     VENTRICULOPERITONEAL SHUNT     x6 put in and removals    FAMILY HISTORY: Family History  Problem Relation Age of Onset   Cancer Mother    Cancer Father    Cancer Maternal Grandmother     ADVANCED DIRECTIVES (Y/N):  N  HEALTH MAINTENANCE: Social History   Tobacco Use   Smoking status: Former    Types: Cigarettes   Smokeless tobacco: Former  Scientific laboratory technician Use: Former  Substance Use Topics   Alcohol use: Not Currently   Drug use: Not Currently     Colonoscopy:  PAP:  Bone density:  Lipid panel:  Allergies  Allergen Reactions   Contrast Media [Iodinated Contrast Media] Hives   Gabapentin Other (See Comments), Rash and Palpitations    Other Reaction: tachycardia Other Reaction: tachycardia  Morphine Dermatitis, Hives, Rash, Swelling and Other (See Comments)    Other reaction(s): Unknown (comments) Has tolerated hydromorphone (Dilaudid) Immediate after injections arm edema and arm turned bright red Immediate after injections arm edema and arm turned bright red IV Morphine IV Morphine    Sumatriptan Dermatitis, Hives, Itching, Other (See Comments) and Swelling    Other reaction(s): Joint Pain, Other  (See Comments), Other (see comments), Unknown (comments) lock jaw Lock jaw Lock jaw Lock jaw TIGHTENING OF JAW Lock jaw lock jaw Lock jaw TIGHTENING OF JAW Lock jaw    Zolpidem Nausea And Vomiting and Other (See Comments)    Other reaction(s): Other (see comments) sleep walking sleep walking Sleep walking  don't tolerate it well    Erythromycin Diarrhea, Nausea And Vomiting and Nausea Only    Extreme upset stomach    Valproic Acid Rash    Other reaction(s): Other (see comments), Unknown MOOD DISORDER MOOD DISORDER Depakote: Reaction unknown     Acetazolamide     Other reaction(s): Unknown (comments)   Erythromycin Base     Other reaction(s): UNKNOWN   Amoxicillin Rash   Divalproex Sodium Anxiety and Other (See Comments)    Current Outpatient Medications  Medication Sig Dispense Refill   ALPRAZolam (XANAX) 0.5 MG tablet Take 1 tablet (0.5 mg total) by mouth 2 (two) times daily. 30 tablet 0   benzonatate (TESSALON) 200 MG capsule Take 1 capsule (200 mg total) by mouth 3 (three) times daily as needed for cough. 30 capsule 0   cholecalciferol (VITAMIN D) 25 MCG tablet Take 1 tablet (1,000 Units total) by mouth daily. 30 tablet 1   cyanocobalamin 1000 MCG tablet Take 1 tablet (1,000 mcg total) by mouth daily. 30 tablet 0   diphenoxylate-atropine (LOMOTIL) 2.5-0.025 MG tablet Take 1 tablet by mouth 4 (four) times daily as needed for diarrhea or loose stools. 60 tablet 2   escitalopram (LEXAPRO) 20 MG tablet Take 1 tablet (20 mg total) by mouth daily at 12 noon. 30 tablet 1   feeding supplement (ENSURE ENLIVE / ENSURE PLUS) LIQD Take 237 mLs by mouth 3 (three) times daily between meals. 21330 mL 0   fentaNYL (DURAGESIC) 25 MCG/HR Place 1 patch onto the skin every 3 (three) days. 5 patch 0   fluticasone (FLONASE) 50 MCG/ACT nasal spray Place 2 sprays into both nostrils daily. (Patient not taking: Reported on 1/94/1740) 16 g 0   folic acid (FOLVITE) 1 MG tablet TAKE ONE TABLET  BY MOUTH DAILY 90 tablet 2   HYDROcodone-acetaminophen (NORCO) 10-325 MG tablet Take 1 tablet by mouth every 6 (six) hours as needed. 60 tablet 0   Lactobacillus (PROBIOTIC ACIDOPHILUS) TABS Take by mouth.     lamoTRIgine (LAMICTAL) 25 MG tablet Take 2 tablets (50 mg total) by mouth daily. 60 tablet 1   lidocaine-prilocaine (EMLA) cream Apply to affected area once 30 g 3   Multiple Vitamin (MULTIVITAMIN WITH MINERALS) TABS tablet Take 1 tablet by mouth daily.     naloxone (NARCAN) nasal spray 4 mg/0.1 mL SPRAY 1 SPRAY INTO ONE NOSTRIL AS DIRECTED FOR OPIOID OVERDOSE (TURN PERSON ON SIDE AFTER DOSE. IF NO RESPONSE IN 2-3 MINUTES OR PERSON RESPONDS BUT RELAPSES, REPEAT USING A NEW SPRAY DEVICE AND SPRAY INTO THE OTHER NOSTRIL. CALL 911 AFTER USE.) * EMERGENCY USE ONLY * (Patient not taking: Reported on 02/02/2021) 1 each 0   ondansetron (ZOFRAN) 8 MG tablet Take 1 tablet (8 mg total) by mouth 2 (two) times daily as needed for  refractory nausea / vomiting. 60 tablet 1   oxyCODONE-acetaminophen (PERCOCET) 7.5-325 MG tablet Take 1 tablet by mouth every 4 (four) hours as needed for severe pain. 20 tablet 0   prochlorperazine (COMPAZINE) 10 MG tablet Take 1 tablet (10 mg total) by mouth every 6 (six) hours as needed (Nausea or vomiting). 30 tablet 0   prochlorperazine (COMPAZINE) 10 MG tablet Take 1 tablet (10 mg total) by mouth every 6 (six) hours as needed for nausea or vomiting. 30 tablet 0   sucralfate (CARAFATE) 1 g tablet Take 1 tablet (1 g total) by mouth 3 (three) times daily. 90 tablet 1   thiamine 100 MG tablet Take 1 tablet (100 mg total) by mouth daily. 30 tablet 0   No current facility-administered medications for this visit.   Facility-Administered Medications Ordered in Other Visits  Medication Dose Route Frequency Provider Last Rate Last Admin   heparin lock flush 100 UNIT/ML injection            heparin lock flush 100 UNIT/ML injection             OBJECTIVE: There were no vitals filed  for this visit.    There is no height or weight on file to calculate BMI.    ECOG FS:0 - Asymptomatic  General: Well-developed, well-nourished, no acute distress. Eyes: Pink conjunctiva, anicteric sclera. HEENT: Normocephalic, moist mucous membranes. Lungs: No audible wheezing or coughing. Heart: Regular rate and rhythm. Abdomen: Soft, nontender, no obvious distention. Musculoskeletal: No edema, cyanosis, or clubbing. Neuro: Alert, answering all questions appropriately. Cranial nerves grossly intact. Skin: No rashes or petechiae noted. Psych: Normal affect.   LAB RESULTS:  Lab Results  Component Value Date   NA 131 (L) 02/26/2021   K 3.6 02/26/2021   CL 99 02/26/2021   CO2 24 02/26/2021   GLUCOSE 151 (H) 02/26/2021   BUN 11 02/26/2021   CREATININE 0.64 02/26/2021   CALCIUM 8.6 (L) 02/26/2021   PROT 6.4 (L) 02/26/2021   ALBUMIN 3.3 (L) 02/26/2021   AST 22 02/26/2021   ALT 14 02/26/2021   ALKPHOS 55 02/26/2021   BILITOT 0.3 02/26/2021   GFRNONAA >60 02/26/2021    Lab Results  Component Value Date   WBC 3.9 (L) 02/26/2021   NEUTROABS 1.5 (L) 02/09/2021   HGB 11.0 (L) 02/26/2021   HCT 34.5 (L) 02/26/2021   MCV 87.1 02/26/2021   PLT 246 02/26/2021     STUDIES: CT ABDOMEN PELVIS W CONTRAST  Result Date: 02/26/2021 CLINICAL DATA:  A 46 year old female arrives with fever and suspected peritonitis/perforation. EXAM: CT ABDOMEN AND PELVIS WITH CONTRAST TECHNIQUE: Multidetector CT imaging of the abdomen and pelvis was performed using the standard protocol following bolus administration of intravenous contrast. RADIATION DOSE REDUCTION: This exam was performed according to the departmental dose-optimization program which includes automated exposure control, adjustment of the mA and/or kV according to patient size and/or use of iterative reconstruction technique. CONTRAST:  55mL OMNIPAQUE IOHEXOL 300 MG/ML  SOLN COMPARISON:  February 04, 2021. FINDINGS: Lower chest: Incidental  imaging of the lung bases without sign of effusion or evidence of consolidative process. Hepatobiliary: Liver with smooth contours and without visible lesion. Biliary duct distension following cholecystectomy is a stable finding dating back to May of 2020. Portal vein is patent. Hepatic veins are patent. Pancreas: Normal, without mass, inflammation or ductal dilatation. Spleen: Normal. Adrenals/Urinary Tract: Adrenal glands are unremarkable. Symmetric renal enhancement. No sign of hydronephrosis. No suspicious renal lesion or perinephric stranding. Urinary bladder is  grossly unremarkable. Stomach/Bowel: Signs of gastric bypass procedure. Mild distension of the excluded stomach. This is less distended than on the previous exam from February 04, 2021 displaying similar distension to the study of 2021-01-24. Roux limb passes anterior to the distal transverse colon. No signs of mesenteric distortion or small bowel obstruction. No perigastric stranding. Mild ileal thickening is less pronounced than on imaging from 01-24-2021. Miles surrounding stranding about the ileum similar to recent imaging. The appendix not visualized and is likely surgically absent based on the appearance of the cecum with suture line about the cecum. No pericolonic stranding about the proximal colon. Signs of mild rectal thickening which is not as pronounced as on imaging from January 24, 2021. Vascular/Lymphatic: Aortic atherosclerosis. No sign of aneurysm. Smooth contour of the IVC. There is no gastrohepatic or hepatoduodenal ligament lymphadenopathy. No retroperitoneal or mesenteric lymphadenopathy. No pelvic sidewall lymphadenopathy. Atherosclerotic changes are mild. Reproductive: Hyperenhancing uterus which is similar to the study of Jan 24, 2021. Signs of arcuate uterus. Serpiginous RIGHT parametrial structure likely distended fallopian tube with some surrounding stranding. This has been present since 2021/01/24 and  shows slight increased distension at 2.5 cm as compared to 2.2 cm greatest width on coronal images. Other: No pneumoperitoneum. No substantial ascites. Trace fluid in the pelvis. Musculoskeletal: No acute or significant osseous findings. IMPRESSION: 1. Signs of gastric bypass procedure with mild distension of the excluded stomach that is a chronic finding. 2. Signs of enteritis, improved involving the ileum in the pelvis and likely related to prior radiation, likely associated with proctitis as well, also improved compared to imaging from 24-Jan-2021. 3. Serpiginous fluid-filled structure along the RIGHT parametrium likely dilated fallopian tube based on appearance. This may represent hydrosalpinx. Would correlate with any signs of infection. Only small increase in distension in this area is noted since 2021-01-24. 4. Stable biliary duct distension following cholecystectomy. 5. Aortic atherosclerosis. Aortic Atherosclerosis (ICD10-I70.0). Electronically Signed   By: Zetta Bills M.D.   On: 02/26/2021 13:57   DG Abd 2 Views  Result Date: 02/05/2021 CLINICAL DATA:  Right-sided abdominal pain EXAM: ABDOMEN - 2 VIEW COMPARISON:  None. FINDINGS: Nonobstructive pattern of bowel gas. No free air in the abdomen. Scattered stool throughout the colon. No radio-opaque calculi or other significant radiographic abnormality is seen. IMPRESSION: Nonobstructive pattern of bowel gas. Scattered stool throughout the colon. No free air in the abdomen. Electronically Signed   By: Delanna Ahmadi M.D.   On: 02/05/2021 13:46    ASSESSMENT: Stage IIb adenocarcinoma of the cervix.  PLAN:    Stage IIb adenocarcinoma of the cervix: By report patient is not a surgical candidate, therefore will proceed with concurrent XRT along with weekly chemotherapy using carboplatinum and Taxol.  She also will benefit from adjuvant brachytherapy which is scheduled at [facility] in early February.  PET scan results from December 06, 2020 reviewed independently with no obvious evidence of malignancy outside of patient's known cervical cancer.  Recently, treatment was delayed nearly 1 month secondary to multiple hospital admissions.  Continue daily XRT, completing treatment on February 15, 2021.  Proceed with patient's fifth and final treatment of weekly carboplatinum and Taxol.  Return to clinic in mid February after her brachytherapy for laboratory work and routine evaluation.   Anemia: Hemoglobin mildly improved to 11.5, monitor. Thrombocytopenia: Resolved. Pelvic pain: Patient did not complain of pain while in clinic, but during infusion was having increased pain and was  given 1 tab of Norco.   Leukopenia: Mild, monitor.  Proceed with treatment as above.   Diarrhea: Resolved, although patient had diarrhea and fusion and was given a prescription for Lomotil.  Patient expressed understanding and was in agreement with this plan. She also understands that She can call clinic at any time with any questions, concerns, or complaints.    Cancer Staging  Cervical cancer, FIGO stage IIB ([place]) Staging form: Cervix Uteri, AJCC Version 9 - Clinical stage from 12/06/2020: FIGO Stage IIB (cT2b, cN0, cM0) - Signed by Lloyd Huger, MD on 12/06/2020 Stage prefix: Initial diagnosis  Lloyd Huger, MD   03/07/2021 8:32 AM

## 2021-03-08 ENCOUNTER — Telehealth: Payer: Self-pay | Admitting: Oncology

## 2021-03-08 NOTE — Telephone Encounter (Signed)
Vanessa Rutter- Looks like she was just in the hospital at [facility]. Unless, Dr. Grayland Ormond says otherwise, I suspect we can cancel tomorrow's appointment, review her [facility] notes, and see when she needs to come in. Is that ok with you Dr. Grayland Ormond? I really don't know that I have the full picture of where things are with her currently but don't believe she needs exam tomorrow. Spoke to Dr. Grayland Ormond. Ok to cancel both appointments and we'll review her recent [facility] notes with gyn onc and see what she needs.   Quentin Romero- Pt didn't answer. VM left that she does not need to report for scheduled appts tomorrow.   2nd attempt to reach patient @ 3:30 with no answer.

## 2021-03-09 ENCOUNTER — Inpatient Hospital Stay: Payer: Medicare Other

## 2021-03-09 ENCOUNTER — Inpatient Hospital Stay: Payer: Medicare Other | Admitting: Oncology

## 2021-03-09 DIAGNOSIS — C539 Malignant neoplasm of cervix uteri, unspecified: Secondary | ICD-10-CM

## 2021-03-10 NOTE — Progress Notes (Deleted)
No-show to second appointment for initial evaluation.

## 2021-03-13 DIAGNOSIS — E559 Vitamin D deficiency, unspecified: Secondary | ICD-10-CM | POA: Insufficient documentation

## 2021-03-14 ENCOUNTER — Other Ambulatory Visit: Payer: Self-pay | Admitting: Pain Medicine

## 2021-03-14 ENCOUNTER — Ambulatory Visit: Payer: Medicare Other | Admitting: Pain Medicine

## 2021-03-14 ENCOUNTER — Encounter: Payer: Self-pay | Admitting: Pain Medicine

## 2021-03-14 DIAGNOSIS — M899 Disorder of bone, unspecified: Secondary | ICD-10-CM

## 2021-03-14 DIAGNOSIS — G894 Chronic pain syndrome: Secondary | ICD-10-CM

## 2021-03-14 DIAGNOSIS — E559 Vitamin D deficiency, unspecified: Secondary | ICD-10-CM

## 2021-03-14 DIAGNOSIS — Z789 Other specified health status: Secondary | ICD-10-CM

## 2021-03-14 DIAGNOSIS — Z91199 Patient's noncompliance with other medical treatment and regimen due to unspecified reason: Secondary | ICD-10-CM

## 2021-03-14 DIAGNOSIS — Z79899 Other long term (current) drug therapy: Secondary | ICD-10-CM

## 2021-03-14 NOTE — Progress Notes (Signed)
This patient has failed to keep two appointments for her initial evaluation at [facility] pain clinic.  The first appointment was on 02/03/2021 and despite the fact that she had been contacted by our staff and she told us that she would be coming in, she did not show up.  Today 03/14/2021 she has failed to keep the second appointment for an initial evaluation.  At this point I will not be offering any further appointments.

## 2021-03-16 ENCOUNTER — Telehealth: Payer: Self-pay | Admitting: *Deleted

## 2021-03-16 NOTE — Telephone Encounter (Signed)
Patient called asking about getting her port removed and also states that she does not have an appointment to get it flushed ( I see that she does in fact have an appointment 4/6 to get it flushed) She also has no FOLLOW UP with Dr Grayland Ormond as her last appointment was cancelled stating that it would be rescheduled per doctor after seen by [facility] and notes reviewed.  ?

## 2021-03-16 NOTE — Telephone Encounter (Signed)
Called pt to seek clarification. Pt did not answer.  ?

## 2021-03-17 ENCOUNTER — Encounter: Payer: Self-pay | Admitting: Oncology

## 2021-03-17 ENCOUNTER — Telehealth: Payer: Self-pay

## 2021-03-17 NOTE — Telephone Encounter (Signed)
Call received on 03/16/21 from Ms. Vanessa Romero with questions regarding her port flushes and time frame for having port removed. Educated on port flushes and she is aware she has a port flush scheduled for 4/6. She has follow up with Dr. [name] on 3/3. She will be moving back to [place], [place] and has follow up arranged in May for PET/Dr. [name] at [facility]. She will see gyn oncology in 09/2021 at [facility]. She is aware of all of these appointments. No further questions at this time. ?

## 2021-03-18 ENCOUNTER — Telehealth: Payer: Self-pay | Admitting: *Deleted

## 2021-03-18 ENCOUNTER — Ambulatory Visit: Payer: Medicare Other | Admitting: Radiation Oncology

## 2021-03-18 NOTE — Telephone Encounter (Signed)
Returned call and rescheduled appointment with Dr. Baruch Gouty to 03/28/21 at 1:00.  ? ?

## 2021-03-18 NOTE — Telephone Encounter (Signed)
Patient left vm on scheduling line. She needs to r/s her apts today with Dr. Baruch Gouty. ?

## 2021-03-24 ENCOUNTER — Other Ambulatory Visit: Payer: Self-pay

## 2021-03-24 ENCOUNTER — Ambulatory Visit: Payer: Medicare Other | Admitting: Gastroenterology

## 2021-03-25 ENCOUNTER — Other Ambulatory Visit: Payer: Self-pay

## 2021-03-28 ENCOUNTER — Ambulatory Visit: Payer: Medicare Other | Attending: Radiation Oncology | Admitting: Radiation Oncology

## 2021-03-31 ENCOUNTER — Ambulatory Visit: Payer: Medicare Other | Admitting: Radiation Oncology

## 2021-04-04 ENCOUNTER — Telehealth: Payer: Self-pay | Admitting: *Deleted

## 2021-04-04 NOTE — Telephone Encounter (Signed)
Called and spoke with Vanessa Romero. She is still moving back to [facility] and will have her follow up at [facility], as it is a 2 hour drive to [place]. This was her decision. All of her appointments have been arranged in [place] up to the time she moves. She will see Dr. Baruch Gouty 3/22 and have her port flushed 4/6. She was hospitalized in Feb  2023 and had a pelvic exam and lab work at that time. She will keep her PET/follow up with Dr. Christel Mormon as scheduled in May 2023 and see Dr. Theora Gianotti, gyn oncology in Sept 2023. She has follow up planned every 3-4 months. She is aware she can be seen in the interim for any new issues that arise. Dr. Baruch Gouty can review her CT she had recently with her. He can also perform a pelvic exam if needed. She reports she has been having some spotting after intercourse. She was encouraged to keep this appointment and go over all concerns with him.

## 2021-04-04 NOTE — Telephone Encounter (Signed)
Appointment Information  ?Name: Vanessa Romero, Vanessa Romero" MRN: [id number]  ?Date: 03/09/2021 Status: Can  ?Time: 8:30 AM Length: 15  ?Visit Type: EST PT 15 [330] Copay: $0.00  ?Provider: Lloyd Huger, MD Department: [facility]  ?Referring Provider: Anselmo Pickler CSN: [id number]  ?    Auto Confirm Status: Reached  ?Notes: 2/22/202 ZYYQ([id number]) and [place] only(0800). BF   ?Made On: ?Canceled: 02/09/2021 10:30 AM ?03/08/2021 3:01 PM By: ?By: Elnoria Howard B ?FULLER, ANGELA B  ?Cancel Rsn: Provider (Pt to be rescheduled once [facility] notes re-evaluated.)  ? ?

## 2021-04-04 NOTE — Telephone Encounter (Signed)
Patient called stating that she went to [facility] and had a CT done which showed an ovarian mass and that she had questions related to that as well. I called her back and we discussed that what the CT showed was an ovarian cyst, not a mass which is common occurrence in women and that the radiographer did not seem concerned about it. She then stated that she is hacving symptoms as she did when first diagnosed and having bleeding with sex. She said it stops shortly afterwards. She has no follow up with Dr Grayland Ormond. She states she is to have a PET scan at [facility] and that she is getting brachytherapy at [facility] and that she will see Dr Christel Mormon after her PET scan and that her follow up appointment at [facility] have been cancelled but she does not know why. Please advise ?

## 2021-04-06 ENCOUNTER — Ambulatory Visit
Admission: RE | Admit: 2021-04-06 | Discharge: 2021-04-06 | Disposition: A | Discharge status: Home / Self Care | Payer: Medicare Other | Source: Ambulatory Visit | Attending: Radiation Oncology | Admitting: Radiation Oncology

## 2021-04-06 ENCOUNTER — Ambulatory Visit: Payer: Medicare Other

## 2021-04-06 ENCOUNTER — Telehealth: Payer: Self-pay | Admitting: *Deleted

## 2021-04-06 ENCOUNTER — Other Ambulatory Visit: Payer: Self-pay

## 2021-04-06 VITALS — HR 76 | Temp 96.5°F | Resp 18 | Ht 67.0 in | Wt 143.3 lb

## 2021-04-06 DIAGNOSIS — C539 Malignant neoplasm of cervix uteri, unspecified: Secondary | ICD-10-CM | POA: Diagnosis present

## 2021-04-06 DIAGNOSIS — Z923 Personal history of irradiation: Secondary | ICD-10-CM | POA: Insufficient documentation

## 2021-04-06 NOTE — Telephone Encounter (Signed)
Appointment made and she was notified

## 2021-04-06 NOTE — Telephone Encounter (Signed)
Patient called to request a follow up appointment with Dr. Theora Gianotti. She stated that Dr. Baruch Gouty had advised her to see [place]. ?

## 2021-04-06 NOTE — Progress Notes (Signed)
Radiation Oncology ?Follow up Note ? ?Name: Vanessa Romero   ?Date:   04/06/2021 ?MRN:  [id number] ?DOB: 02/18/1975  ? ? ?This 46 y.o. female presents to the clinic today for 1 month follow-up status post both external beam radiation therapy to her pelvis as well as brachytherapy at [facility] for FIGO stage IIb high-grade invasive carcinoma of the cervix.  Patient also received carboplatin and Taxol. ? ?REFERRING PROVIDER: Anselmo Pickler* ? ?HPI: Patient is a 46 year old female with locally advanced poorly differentiated carcinoma of the cervix status post concurrent chemoradiation as well as intracavitary brachytherapy at [facility] by Dr. Christel Mormon.  She is seen today in follow-up.  She states she started to see some clots on occasion.  She is also having some occasional spasm and pain in her pelvis..  She had a CT scan back in February showing fluid-filled structure along the right parametrium likely in the light dilated fallopian tube.  She is recently moved to [facility] and is scheduled to have her follow-up at [facility].  She has a scheduled follow-up with Dr. Christel Mormon. ? ?COMPLICATIONS OF TREATMENT: none ? ?FOLLOW UP COMPLIANCE: keeps appointments  ? ?PHYSICAL EXAM:  ?Pulse 76   Temp (!) 96.5 ?F (35.8 ?C)   Resp 18   Ht '5\' 7"'$  (1.702 m)   Wt 143 lb 4.8 oz (65 kg)   BMI 22.44 kg/m?  ?Well-developed well-nourished patient in NAD. HEENT reveals PERLA, EOMI, discs not visualized.  Oral cavity is clear. No oral mucosal lesions are identified. Neck is clear without evidence of cervical or supraclavicular adenopathy. Lungs are clear to A&P. Cardiac examination is essentially unremarkable with regular rate and rhythm without murmur rub or thrill. Abdomen is benign with no organomegaly or masses noted. Motor sensory and DTR levels are equal and symmetric in the upper and lower extremities. Cranial nerves II through XII are grossly intact. Proprioception is intact. No peripheral adenopathy or edema is identified. No  motor or sensory levels are noted. Crude visual fields are within normal range. ? ?RADIOLOGY RESULTS: CT scan reviewed compatible with above-stated findings ? ?PLAN: At this [place] she will have follow-up appointments at [facility] with Dr. Christel Mormon also, schedule her follow-up at [facility] with Dr. Theora Gianotti.  Distance to [place] is too far from her current home.  I will turn follow-up care over to the [facility] staff.  She has a PET scan scheduled next month and I will review those results when available. ? ?I would like to take this opportunity to thank you for allowing me to participate in the care of your patient.. ?  ? Noreene Filbert, MD ? ?

## 2021-04-13 ENCOUNTER — Inpatient Hospital Stay: Payer: Medicare Other

## 2021-04-14 ENCOUNTER — Encounter: Payer: Self-pay | Admitting: Oncology

## 2021-04-19 ENCOUNTER — Telehealth: Payer: Self-pay | Admitting: *Deleted

## 2021-04-19 NOTE — Telephone Encounter (Signed)
Patient called asking to change her upcoming appointment 4/6 to Friday in the morning ?

## 2021-04-21 ENCOUNTER — Inpatient Hospital Stay: Payer: Medicare Other

## 2021-04-22 ENCOUNTER — Inpatient Hospital Stay: Payer: Medicare Other | Attending: Radiation Oncology

## 2021-04-22 DIAGNOSIS — Z452 Encounter for adjustment and management of vascular access device: Secondary | ICD-10-CM | POA: Insufficient documentation

## 2021-04-22 DIAGNOSIS — C539 Malignant neoplasm of cervix uteri, unspecified: Secondary | ICD-10-CM | POA: Insufficient documentation

## 2021-04-22 DIAGNOSIS — Z95828 Presence of other vascular implants and grafts: Secondary | ICD-10-CM

## 2021-04-22 MED ORDER — SODIUM CHLORIDE 0.9% FLUSH
10.0000 mL | Freq: Once | INTRAVENOUS | Status: AC
  Administered 2021-04-22: 10 mL via INTRAVENOUS
  Filled 2021-04-22: qty 10

## 2021-04-22 MED ORDER — HEPARIN SOD (PORK) LOCK FLUSH 100 UNIT/ML IV SOLN
500.0000 [IU] | Freq: Once | INTRAVENOUS | Status: AC
  Administered 2021-04-22: 500 [IU] via INTRAVENOUS
  Filled 2021-04-22: qty 5

## 2021-05-11 ENCOUNTER — Telehealth: Payer: Self-pay

## 2021-05-11 ENCOUNTER — Inpatient Hospital Stay: Payer: Medicare Other | Attending: Obstetrics and Gynecology

## 2021-05-11 NOTE — Progress Notes (Deleted)
Gynecologic Oncology Consult Visit   Referring Provider: Dr Georgianne Fick  Chief Complaint: Cervical Cancer  Subjective:  Vanessa Romero is a G56P2 46 y.o. female who is seen in consultation from Dr. Georgianne Fick for new cervical cancer diagnosis.   *** She initially presented to ER on 11/22/20 for loss of consciousness and detoxification from alcohol. Previously sober for 2 years. She was found to be anemic. Seen by GI and also reported vaginal bleeding. Gynecology, Dr. Gilman Schmidt was consulted. Reported daily vaginal bleeding x 1.5 years, post coital bleeding, general pelvic pain and discomfort, hx of prolapse. Family history of endometriosis in mother and ovarian cancer and endometriosis in grandmother. She denies history of abnormal pap smears.   Most recent pap was 06/15/20- NILM. HPV testing was not performed  11/22/20- HIV was negative  11/23/20- Pelvic ultrasound Uterus: Measurements: 9.9 x 5.4 x 5.7 cm = volume: 158 mL. Area of the cervix with variable echogenicitya, not well-defined with region measuring perhaps as much as 3.2 x 2.8 cm with mildly bulky appearance.   Endometrium not as well assessed due to "[place] blind sign" and globular appearance of the uterus.   Endometrium: Thickness: 10 mm. Limited assessment of the endometrium not well visualized due to suspected adenomyosis.   Right ovary: Measurements: 5.6 x 3.7 x 3.6 cm = volume: 38 mL. Multi-cystic appearance of the RIGHT ovary, 1 of these appears to show a septation. Small amount of free fluid adjacent to the RIGHT ovary. When measured in total cystic areas up to 4.8 x 3.9 x 2.7 cm. Very likely this is multiple adjacent follicles, a 2.7 x 1.6 cm area displays signs of septation. No visible mural nodularity.   Left ovary: Not visualized. The patient was not able to continue with the examination due to pelvic discomfort. Pulsed Doppler evaluation demonstrates normal low resistance arterial and venous waveforms on the RIGHT.   Other  findings:  Small free fluid in the pelvis.  11/24/20- MR Pelvis W Wo Contrast Mass in the cervix highly suspicious for cervical cancer with potential early stromal invasion/extension towards the RIGHT parametrium. No gross adenopathy in the pelvis No suspicious lesion in the RIGHT ovary with signs of ovarian cysts. Small endometrial implant likely along the leading edge of 1 of the ovarian cysts or the margin of the RIGHT ovary Suspected hematosalpinx due to endometriosis explaining the more septated appearing finding on the ultrasound.. Adenomyosis.  Small free fluid in the pelvis.  EUA and cervical biopsies - 11/26/20 11/26/20- Ectocervical, endocervical, and tru-cut biopsies are all positive for high grade invasive carcinoma extending the full thickness of the largest fragments in the first 2 samples and the full length of the core biopsy. Special stains for further characterization to include HPV status are pending. Pathology has yet to be finalized.   She continues to have vaginal bleeding. Endorses 30 lb weight loss over past year. She does vape. Previously smoked cigarettes.   Problem List: Patient Active Problem List   Diagnosis Date Noted   Vitamin D deficiency 03/13/2021   HTN (hypertension) 02/26/2021   Depression with anxiety 02/26/2021   Bipolar disorder ([place]) 02/26/2021   Normocytic anemia 02/26/2021   Hydrosalpinx    Chronic pain syndrome 02/02/2021   Pharmacologic therapy 02/02/2021   Disorder of skeletal system 02/02/2021   Problems influencing health status 02/02/2021   Chronic use of opiate for therapeutic purpose 02/02/2021   Preoperative evaluation of a medical condition to rule out surgical contraindications (TAR required) 01/25/2021   COVID-19 virus  infection 01/22/2021   SBO (small bowel obstruction) ([place]) 01/22/2021   Other chronic pain    Malnutrition of moderate degree 01/10/2021   C. 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   Tylenol overdose 12/01/2020   Protein-calorie malnutrition, severe 11/26/2020   Cervical mass    Acute blood loss anemia    Vaginal bleeding    GI bleed 11/23/2020   Drop in hemoglobin    Seizure disorder ([place])    Hypokalemia    Alcohol withdrawal ([place]) 11/22/2020   Tobacco dependence 11/22/2020   Anxiety and depression 11/22/2020   LVH (left ventricular hypertrophy) 11/22/2020   Acute gastroenteritis 11/22/2020   Hyponatremia 08/27/2020   RUQ abdominal pain 06/16/2020   Chronic anemia 05/18/2020   History of alcohol abuse 03/24/2020   Diarrhea 03/02/2020   Bilateral primary osteoarthritis of knee 01/06/2020   Anastomotic ulcer 12/27/2019   Partial bowel obstruction ([place]) 12/25/2019   Opioid dependence with withdrawal ([place]) 06/23/2019   Bilateral lower extremity edema 02/28/2018   Prolonged Q-T interval on ECG 02/28/2018   Difficult airway for intubation 06/28/2016   Intracranial hypertension 06/28/2016   Bipolar 2 disorder ([place]) 05/16/2016   Iron deficiency anemia following bariatric surgery 10/22/2015   IIH (idiopathic intracranial hypertension) 09/02/2015   Intractable chronic migraine without aura and without status migrainosus 09/02/2015   S/P gastric bypass 09/02/2015   H/O small bowel obstruction 06/27/2013   S/P exploratory laparotomy 06/04/2012   Benign essential hypertension 01/24/2012   Mild intermittent asthma without complication 09/32/3557   Presence of cerebrospinal fluid drainage device 01/24/2012   Tobacco use disorder 01/24/2012   Substance abuse ([place]) 05/26/2011   Knee pain 05/11/2011    Past Medical History: Past Medical History:  Diagnosis Date   Alcohol abuse     Anemia    Cancer ([place])    Tobacco dependence     Past Surgical History: Past Surgical History:  Procedure Laterality Date   ABDOMINAL ADHESION SURGERY     bowel obstruction     x2   CERVICAL CONIZATION W/BX N/A 11/26/2020   Procedure: CONIZATION CERVIX WITH BIOPSY;  Surgeon: Malachy Mood, MD;  Location: [facility] ORS;  Service: Gynecology;  Laterality: N/A;   ESOPHAGOGASTRODUODENOSCOPY N/A 11/24/2020   Procedure: ESOPHAGOGASTRODUODENOSCOPY (EGD);  Surgeon: Lin Landsman, MD;  Location: [facility] ENDOSCOPY;  Service: Gastroenterology;  Laterality: N/A;   laparoscopic knee surgery     PORTA CATH INSERTION N/A 12/20/2020   Procedure: PORTA CATH INSERTION;  Surgeon: Algernon Huxley, MD;  Location: [facility];  Service: Cardiovascular;  Laterality: N/A;   ROUX-EN-Y GASTRIC BYPASS     TEAR DUCT PROBING     unclogg   VAGOTOMY     VENTRICULOPERITONEAL SHUNT     x6 put in and removals    Past Gynecologic History:  Menarche: age 78 Pain with menses Irregular menses Sexually active Denies history of STDs Denies history of abnormal pap  OB History:  OB History  Gravida Para Term Preterm AB Living  6 2 0 0 4 0  SAB IAB Ectopic Multiple Live Births  3 1 0 0 0    # Outcome Date GA Lbr Len/2nd Weight Sex Delivery Anes PTL Lv  6  Para           5 Para           4 IAB           3 SAB           2 SAB           1 SAB             Obstetric Comments  Vaginal deliveries    Family History: Family History  Problem Relation Age of Onset   Cancer Mother    Cancer Father    Cancer Maternal Grandmother     Social History: Social History   Socioeconomic History   Marital status: Married    Spouse name: Not on file   Number of children: Not on file   Years of education: Not on file   Highest education level: Not on file  Occupational History   Not on file  Tobacco Use   Smoking status: Former    Types: Cigarettes   Smokeless tobacco: Former  Scientific laboratory technician  Use: Former  Substance and Sexual Activity   Alcohol use: Not Currently   Drug use: Not Currently   Sexual activity: Yes    Birth control/protection: None  Other Topics Concern   Not on file  Social History Narrative   Not on file   Social Determinants of Health   Financial Resource Strain: Not on file  Food Insecurity: Not on file  Transportation Needs: Not on file  Physical Activity: Not on file  Stress: Not on file  Social Connections: Not on file  Intimate Partner Violence: Not on file    Allergies: Allergies  Allergen Reactions   Contrast Media [Iodinated Contrast Media] Hives   Gabapentin Other (See Comments), Rash and Palpitations    Other Reaction: tachycardia Other Reaction: tachycardia    Morphine Dermatitis, Hives, Rash, Swelling and Other (See Comments)    Other reaction(s): Unknown (comments) Has tolerated hydromorphone (Dilaudid) Immediate after injections arm edema and arm turned bright red Immediate after injections arm edema and arm turned bright red IV Morphine IV Morphine    Sumatriptan Dermatitis, Hives, Itching, Other (See Comments) and Swelling    Other reaction(s): Joint Pain, Other (See Comments), Other (see comments), Unknown (comments) lock jaw Lock jaw Lock jaw Lock jaw TIGHTENING OF JAW Lock jaw lock jaw Lock jaw TIGHTENING OF JAW Lock jaw    Zolpidem Nausea And Vomiting and Other (See Comments)    Other reaction(s): Other (see comments) sleep walking sleep walking Sleep walking  don't tolerate it well    Erythromycin Diarrhea, Nausea And Vomiting and Nausea Only    Extreme upset stomach    Valproic Acid Rash    Other reaction(s): Other (see comments), Unknown MOOD DISORDER MOOD DISORDER Depakote: Reaction unknown     Acetazolamide     Other reaction(s): Unknown (comments)   Erythromycin Base     Other reaction(s): UNKNOWN   Amoxicillin Rash   Divalproex Sodium Anxiety and Other (See Comments)    Current  Medications: Current Outpatient Medications  Medication Sig Dispense Refill   ALPRAZolam (XANAX) 0.25 MG tablet Take 0.25 mg by mouth 2 (two) times daily as needed.     ALPRAZolam (XANAX) 0.5 MG tablet Take 1 tablet (0.5 mg total) by mouth 2 (two) times daily. 30 tablet 0   benzonatate (TESSALON) 200 MG capsule Take 1 capsule (200 mg total) by mouth 3 (three) times daily as needed for  cough. 30 capsule 0   cholecalciferol (VITAMIN D) 25 MCG tablet Take 1 tablet (1,000 Units total) by mouth daily. 30 tablet 1   clonazePAM (KLONOPIN) 0.5 MG tablet Take 0.5 mg by mouth 2 (two) times daily as needed.     cyanocobalamin 1000 MCG tablet Take 1 tablet (1,000 mcg total) by mouth daily. 30 tablet 0   diclofenac Sodium (VOLTAREN) 1 % GEL Apply topically.     diphenoxylate-atropine (LOMOTIL) 2.5-0.025 MG tablet Take 1 tablet by mouth 4 (four) times daily as needed for diarrhea or loose stools. 60 tablet 2   escitalopram (LEXAPRO) 20 MG tablet Take 1 tablet (20 mg total) by mouth daily at 12 noon. 30 tablet 1   feeding supplement (ENSURE ENLIVE / ENSURE PLUS) LIQD Take 237 mLs by mouth 3 (three) times daily between meals. 21330 mL 0   fentaNYL (DURAGESIC) 75 MCG/HR 1 patch every 3 (three) days.     fluticasone (FLONASE) 50 MCG/ACT nasal spray Place 2 sprays into both nostrils daily. (Patient not taking: Reported on 6/57/8469) 16 g 0   folic acid (FOLVITE) 1 MG tablet TAKE ONE TABLET BY MOUTH DAILY 90 tablet 2   HYDROcodone-acetaminophen (NORCO) 10-325 MG tablet Take 1 tablet by mouth every 6 (six) hours as needed. 60 tablet 0   ibuprofen (ADVIL) 600 MG tablet Take 600 mg by mouth every 6 (six) hours as needed.     Lactobacillus (PROBIOTIC ACIDOPHILUS) TABS Take by mouth.     lamoTRIgine (LAMICTAL) 25 MG tablet Take 2 tablets (50 mg total) by mouth daily. 60 tablet 1   lidocaine-prilocaine (EMLA) cream Apply to affected area once 30 g 3   Multiple Vitamin (MULTIVITAMIN WITH MINERALS) TABS tablet Take 1  tablet by mouth daily.     naloxone (NARCAN) nasal spray 4 mg/0.1 mL Place into the nose.     OLANZapine (ZYPREXA) 2.5 MG tablet Take 2.5 mg by mouth at bedtime.     ondansetron (ZOFRAN) 8 MG tablet Take 1 tablet (8 mg total) by mouth 2 (two) times daily as needed for refractory nausea / vomiting. 60 tablet 1   oxyCODONE (ROXICODONE) 15 MG immediate release tablet Take by mouth.     oxyCODONE-acetaminophen (PERCOCET) 7.5-325 MG tablet Take 1 tablet by mouth every 4 (four) hours as needed for severe pain. 20 tablet 0   pregabalin (LYRICA) 50 MG capsule Take 50 mg by mouth 3 (three) times daily.     prochlorperazine (COMPAZINE) 10 MG tablet Take 1 tablet (10 mg total) by mouth every 6 (six) hours as needed (Nausea or vomiting). 30 tablet 0   prochlorperazine (COMPAZINE) 10 MG tablet Take 1 tablet (10 mg total) by mouth every 6 (six) hours as needed for nausea or vomiting. 30 tablet 0   senna-docusate (SENOKOT-S) 8.6-50 MG tablet Take by mouth.     sucralfate (CARAFATE) 1 g tablet Take 1 tablet (1 g total) by mouth 3 (three) times daily. 90 tablet 1   thiamine 100 MG tablet Take 1 tablet (100 mg total) by mouth daily. 30 tablet 0   No current facility-administered medications for this visit.   Facility-Administered Medications Ordered in Other Visits  Medication Dose Route Frequency Provider Last Rate Last Admin   heparin lock flush 100 UNIT/ML injection            heparin lock flush 100 UNIT/ML injection             General: negative for fevers, changes in weight or night sweats.  Positive for weakness Skin: negative for changes in moles or sores or rash Eyes: negative for changes in vision HEENT: negative for change in hearing, tinnitus, voice changes Pulmonary: negative for dyspnea, orthopnea, productive cough, wheezing Cardiac: negative for palpitations, pain Gastrointestinal: positive for nausea, vomiting, diarrhea Genitourinary/Sexual: negative for dysuria, retention, incontinence.  Positive for hematuria Ob/Gyn: positive for bleeding and pain Musculoskeletal: negative for pain, joint pain, back pain Hematology: negative for easy bruising, positive for weakness Neurologic/Psych: negative for seizures, paralysis, weakness, numbness. Positive for headaches  Objective:  Physical Examination:  There were no vitals taken for this visit.    ECOG Performance Status: 1 - Symptomatic but completely ambulatory  GENERAL: Patient is a well appearing female in no acute distress HEENT:  PERRL, neck supple with midline trachea. Thyroid without masses.  NODES:  No cervical, supraclavicular, axillary, or inguinal lymphadenopathy palpated.  LUNGS:  Clear to auscultation bilaterally.  No wheezes or rhonchi. HEART:  Regular rate and rhythm. No murmur appreciated. ABDOMEN:  Soft, nontender, nondistended.  Vertical incision with hernia upper aspect.  MSK:  No focal spinal tenderness to palpation. Full range of motion bilaterally in the upper extremities. EXTREMITIES:  No peripheral edema.   SKIN:  Clear with no obvious rashes or skin changes. No nail dyscrasia. NEURO:  Nonfocal. Well oriented.  Appropriate affect.  Pelvic- Exam chaperoned by Nursing EGBUS: no lesions Cervix: no lesions, rock hard to palpation and tender Vagina: no lesions, no discharge or bleeding Uterus: normal size, retroverted, mobile but tender with movement BME: suspect medial parametrial involvement on right - very tender on exam. On the left shortened uterosacral ligament. Disease does not extend to the sidewall.  Adnexa: no palpable masses Rectovaginal: confirmatory  Lab Review Lab Results  Component Value Date   WBC 3.9 (L) 02/26/2021   HGB 11.0 (L) 02/26/2021   HCT 34.5 (L) 02/26/2021   MCV 87.1 02/26/2021   PLT 246 02/26/2021     Chemistry      Component Value Date/Time   NA 131 (L) 02/26/2021 0742   K 3.6 02/26/2021 0742   CL 99 02/26/2021 0742   CO2 24 02/26/2021 0742   BUN 11 02/26/2021  0742   CREATININE 0.64 02/26/2021 0742      Component Value Date/Time   CALCIUM 8.6 (L) 02/26/2021 0742   ALKPHOS 55 02/26/2021 0742   AST 22 02/26/2021 0742   ALT 14 02/26/2021 0742   BILITOT 0.3 02/26/2021 0742       Radiologic Imaging: Pending    Assessment:  Vanessa Romero is a 46 y.o. female diagnosed with at least stage IIB cervical cancer.   Elevated transaminases  Poor IV access  Medical co-morbidities complicating care: h/o malnutrition, alcohol/tobacco use Plan:   Problem List Items Addressed This Visit   None   We discussed options for management and I recommended PET/CT, Radiation Oncology consult, and Medical Oncology consult.   Poor IV access - recommended consideration for IV port and to discuss with her Medical Oncologist  Suggested return to clinic in  1-3 months after completion of radiation therapy.   The patient's diagnosis, an outline of the further diagnostic and laboratory studies which will be required, the recommendation for surgery, and alternatives were discussed with her and her accompanying family members.  All questions were answered to their satisfaction.  Beckey Rutter, NP  I personally had a face to face interaction and evaluated the patient jointly with the NP, Ms. Beckey Rutter.  I have reviewed her history  and available records and have performed the key portions of the physical exam including lymph node survey, abdominal exam, pelvic exam with my findings confirming those documented above by the APP.  I have discussed the case with the APP and the patient.  I agree with the above documentation, assessment and plan which was fully formulated by me.  Counseling was completed by me.   I personally saw the patient and performed a substantive portion of this encounter in conjunction with the listed APP as documented above.  Collectively a total of at least 80 minutes were spent with the patient/family today; >50% was spent in education,  counseling and coordination of care for cervical cancer.   Georgie Haque Gaetana Michaelis, MD

## 2021-05-11 NOTE — Telephone Encounter (Signed)
Received notification from Vanessa Romero that a message was left by patient or her mother that she was having issues. [facility] reached out to [place] to contact patient. She has an appointment with Dr. Theora Gianotti already scheduled today at 1430. Called and left voicemail with Ms. Dibenedetto for follow up. ?

## 2021-07-18 ENCOUNTER — Telehealth: Payer: Self-pay

## 2021-07-18 NOTE — Telephone Encounter (Signed)
PA done for EMLA cream. KEY:[id number]

## 2021-07-25 ENCOUNTER — Telehealth: Payer: Self-pay | Admitting: *Deleted

## 2021-07-25 NOTE — Telephone Encounter (Signed)
Patient called to request appointment for port flush.   Patient also requesting recommendation on who she can follow with regarding incontinence. I do not see any f/u scheduled for her at the cancer center as of now.

## 2021-07-25 NOTE — Telephone Encounter (Signed)
Patient called requesting contact information for Dr. Gershon Crane office at [facility]. Call returned and requested information left on patients voicemail.

## 2021-07-25 NOTE — Telephone Encounter (Signed)
Called and spoke with patient, she reports she is seeing Dr. Christel Mormon and palliative care at [facility] but is still planning to see Dr. Theora Gianotti and follow with medical oncology if needed here. She is reporting incontinence, pain and worsening bleeding. I advised that I would send this to the team here but also advised that she should make the team following her at [facility] aware of these symptoms as well as it sounds like she is keeping closer follow up there.  We can schedule her for port flush here.

## 2021-09-26 ENCOUNTER — Inpatient Hospital Stay: Payer: Medicare Other | Attending: Internal Medicine

## 2021-11-14 ENCOUNTER — Encounter (INDEPENDENT_AMBULATORY_CARE_PROVIDER_SITE_OTHER): Payer: Self-pay

## 2021-11-16 ENCOUNTER — Inpatient Hospital Stay: Payer: Medicare Other | Attending: Oncology

## 2021-12-05 ENCOUNTER — Inpatient Hospital Stay: Payer: Medicare Other

## 2023-01-21 IMAGING — CR DG ABDOMEN 2V
1 series · 3 of 3 positions shown · non-contrast
Comparison: CT Abdomen and Pelvis 01/22/2021 and earlier.

CLINICAL DATA: 45-year-old female with right lower quadrant pain,
nausea vomiting diarrhea for 2 days. Prior gastric bypass.

EXAM:
ABDOMEN - 2 VIEW

[Series 1: dg abd 2 views · 0.14mm/px · 3 of 3 slices shown]
[im 1/3]
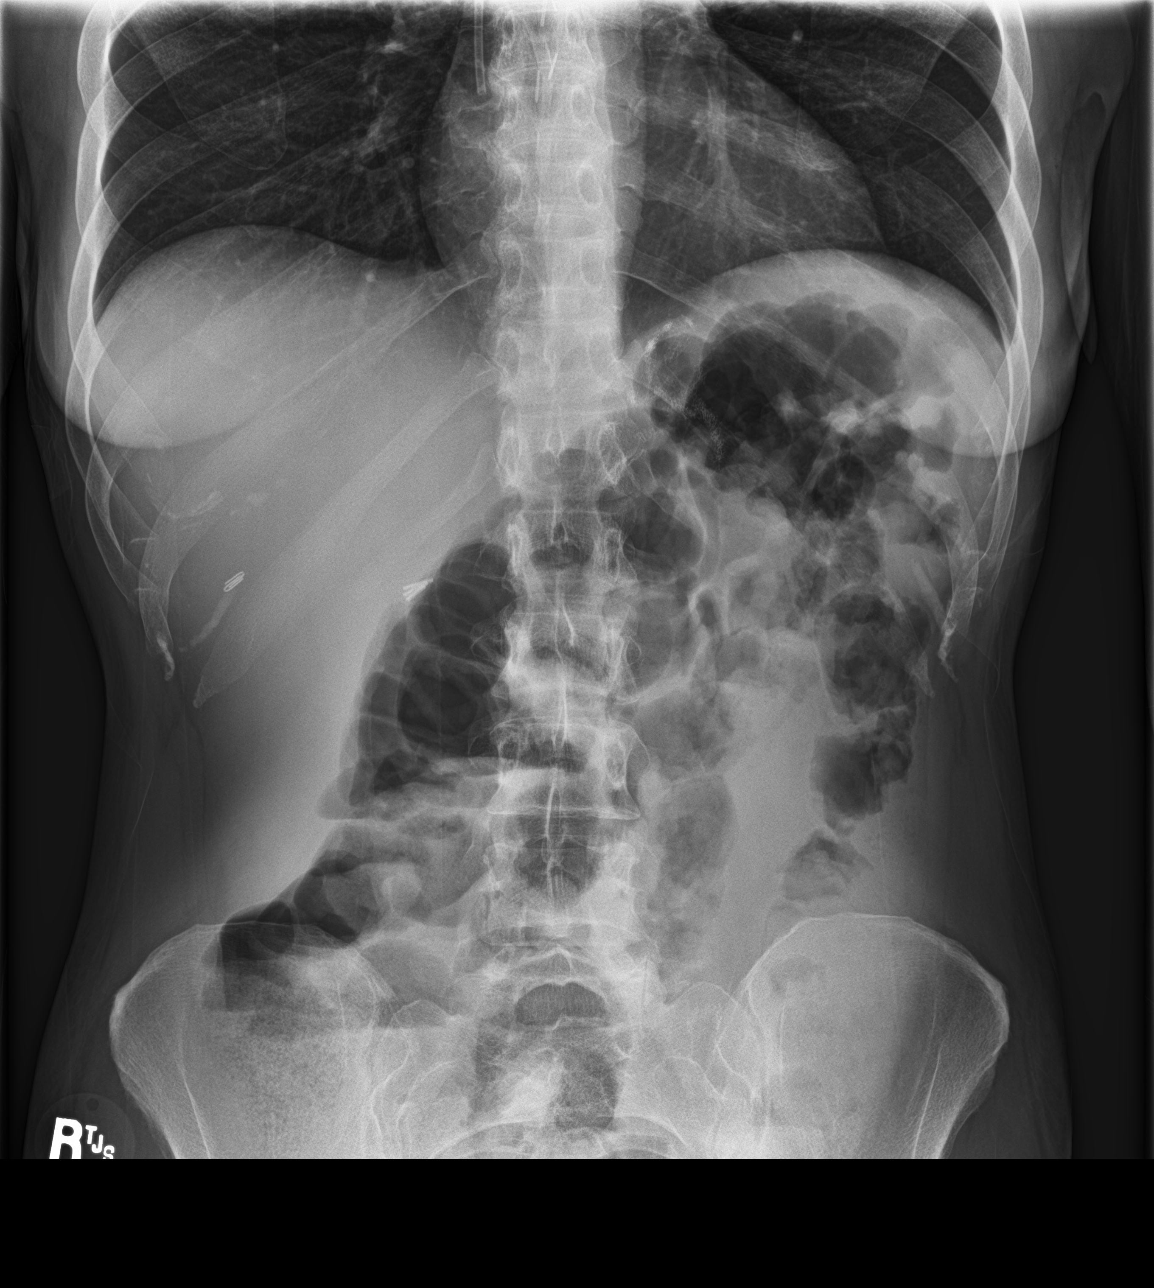
[im 2/3]
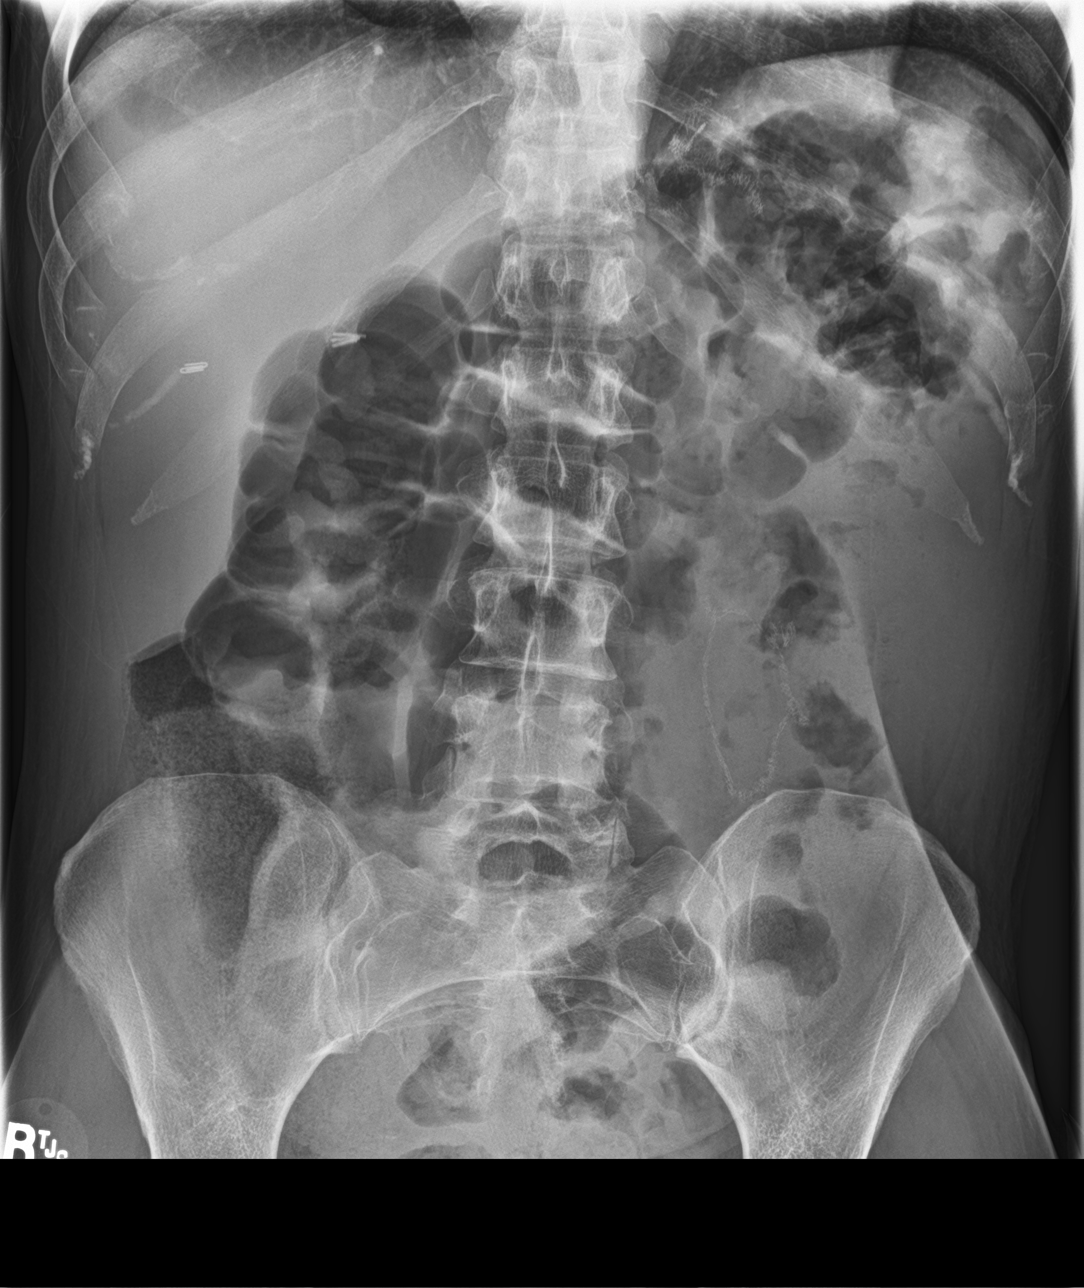
[im 3/3]
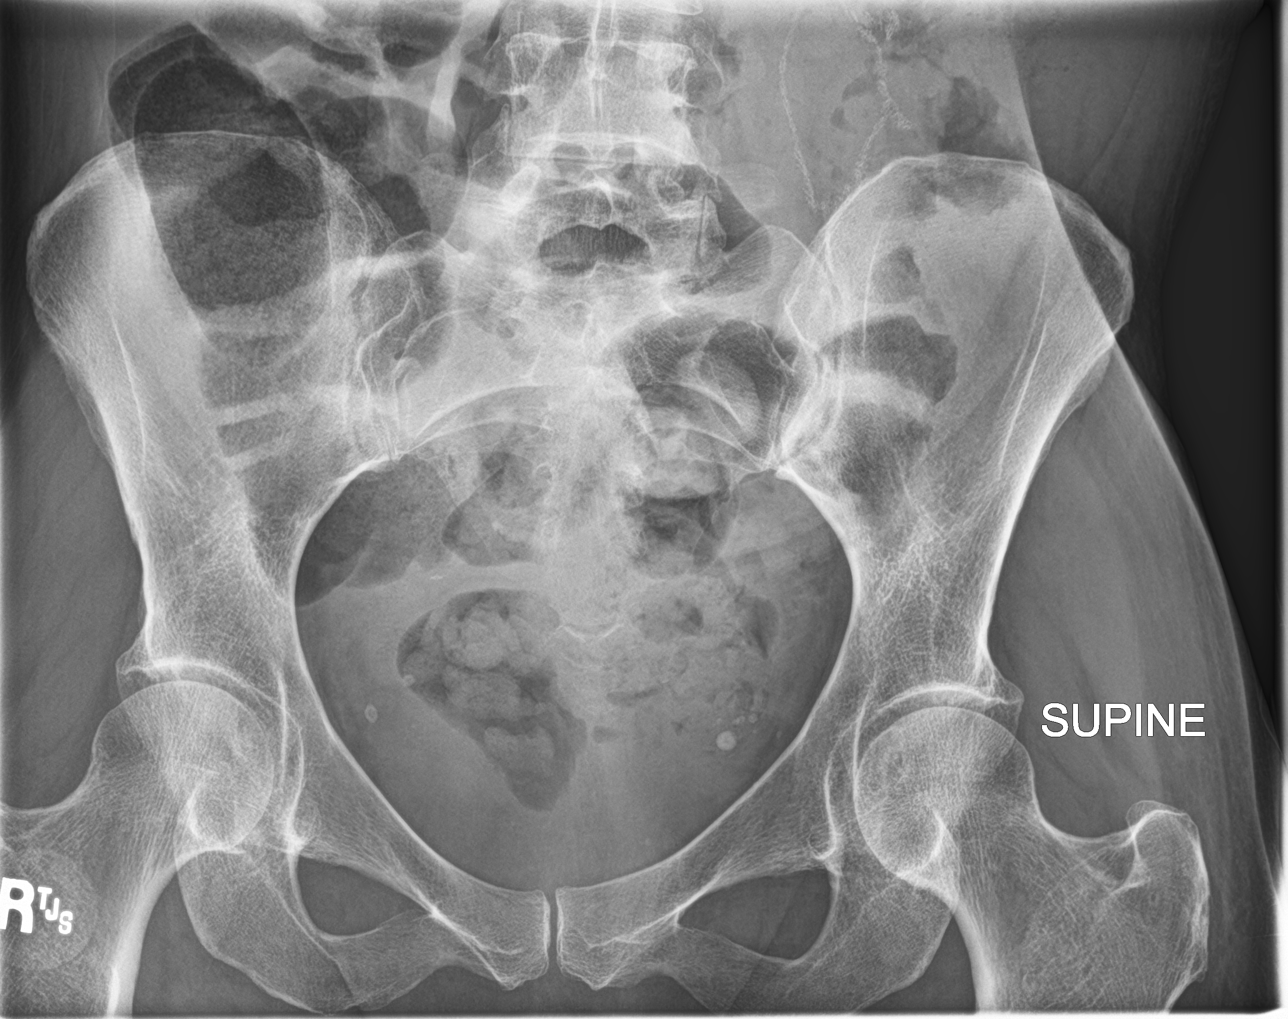

[3 of 3 positions shown; findings below may reference images not displayed]

FINDINGS: Upright and supine views of the abdomen and pelvis. Non obstructed
bowel gas pattern. Negative lung bases. No pneumoperitoneum. Left
upper quadrant, left mid abdomen, and right upper quadrant
postoperative changes with clips and staple lines. Incidental pelvic
phleboliths. Partially visible mediastinal vascular catheter at the
cavoatrial junction. No acute osseous abnormality identified.
IMPRESSION: Normal bowel gas pattern, no free air. Prior gastric bypass and
cholecystectomy.

## 2023-01-24 IMAGING — CT CT ABD-PELV W/O CM
2 of 4 series · 15 of 46 positions shown, 17 images · non-contrast
Comparison: CT abdomen and pelvis 01/22/2021

CLINICAL DATA: Right-sided abdominal pain



[Series 2: routine abd/pel wo · axial · 0.68mm/px · z∈[-616,-166]mm · 12 of 100 slices shown, 14 images]
[im 5/100  soft-tissue]
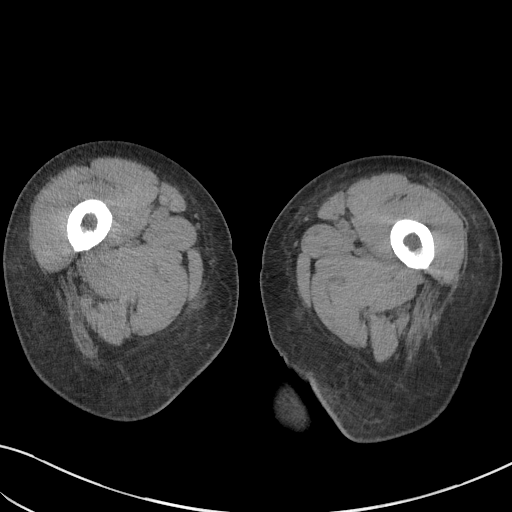
[im 5/100  bone]
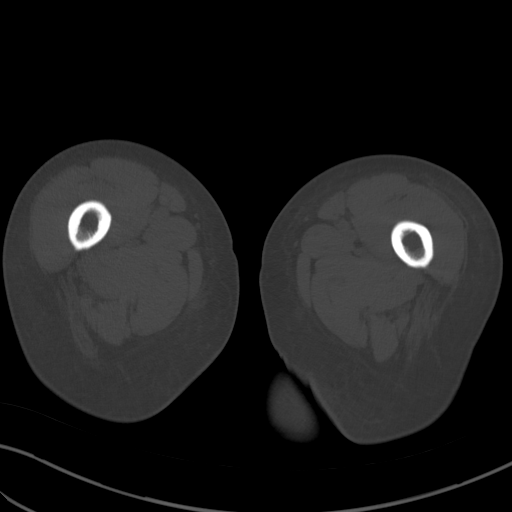
[im 13/100  soft-tissue]
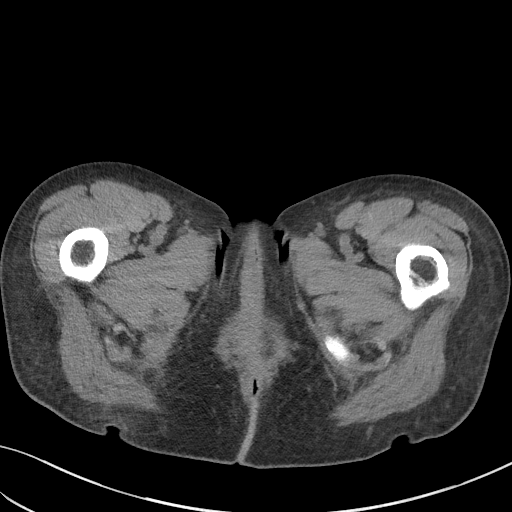
[im 21/100  soft-tissue]
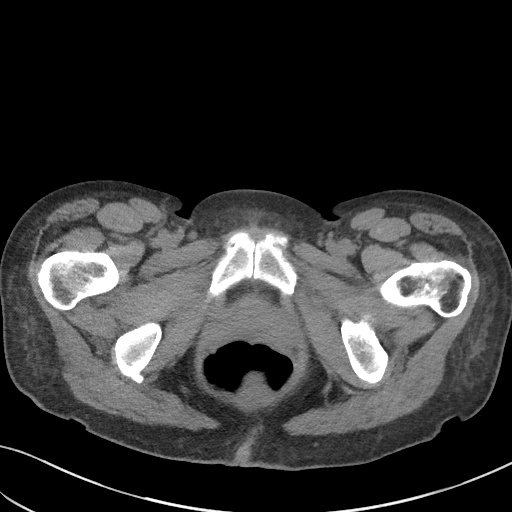
[im 29/100  soft-tissue]
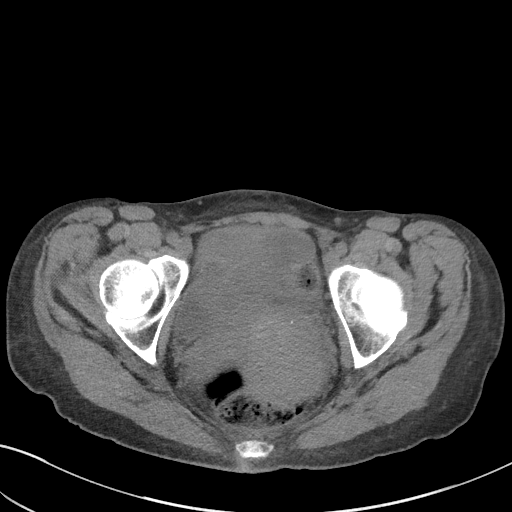
[im 38/100  soft-tissue]
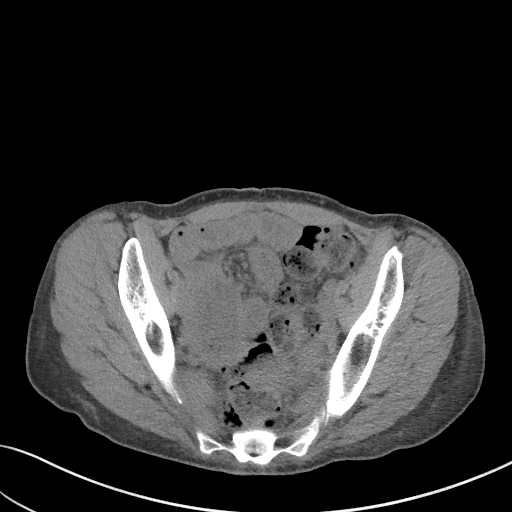
[im 46/100  soft-tissue]
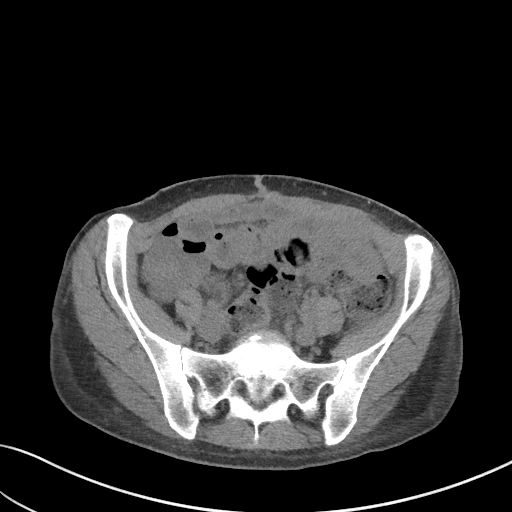
[im 54/100  soft-tissue]
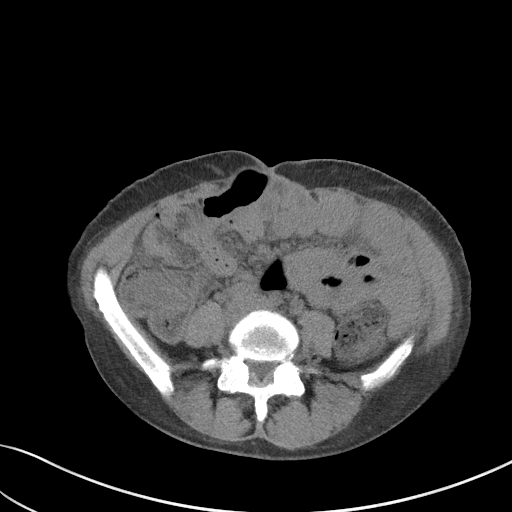
[im 62/100  soft-tissue]
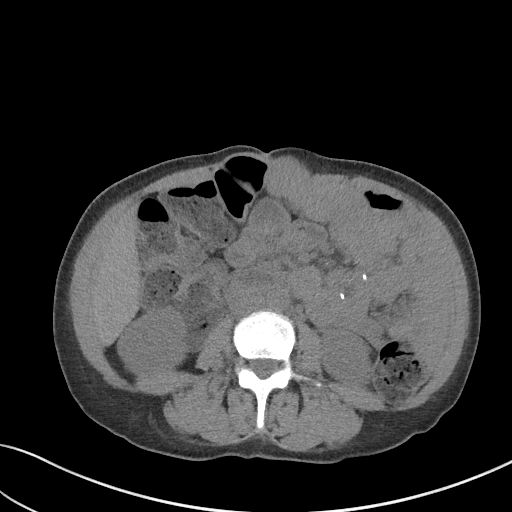
[im 71/100  soft-tissue]
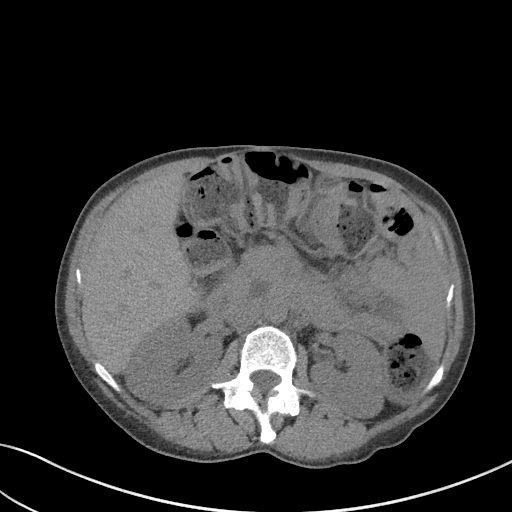
[im 71/100  bone]
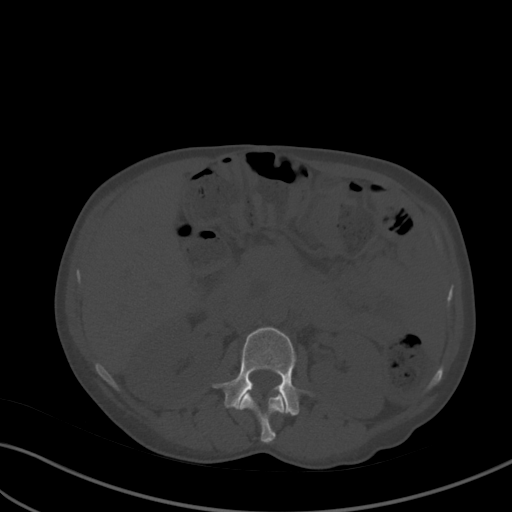
[im 79/100  soft-tissue]
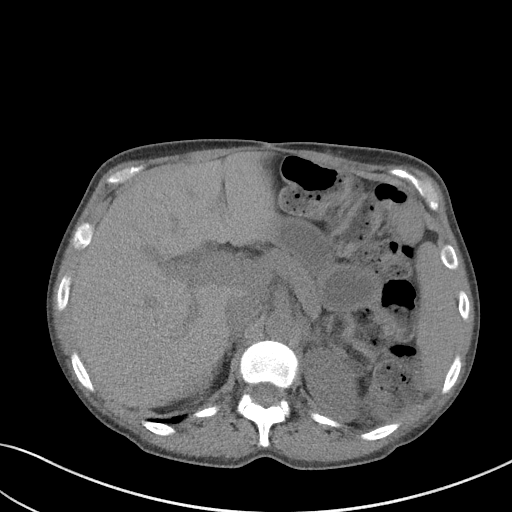
[im 87/100  soft-tissue]
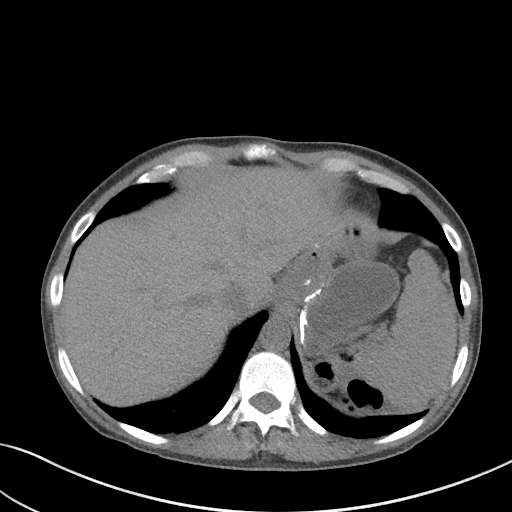
[im 95/100  soft-tissue]
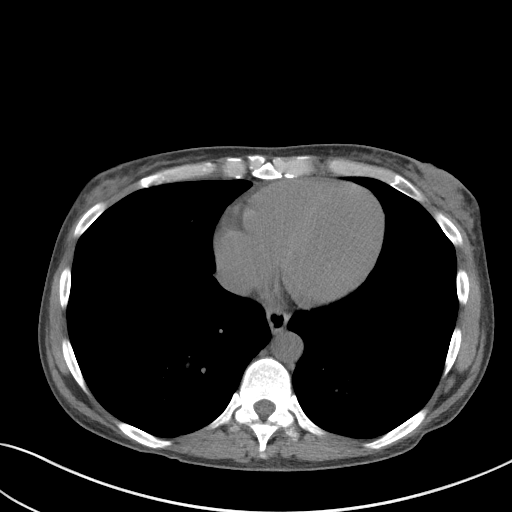

[Series 5: coronal st · coronal · 0.66mm/px · 3 of 79 slices shown]
[im 27/79  soft-tissue]
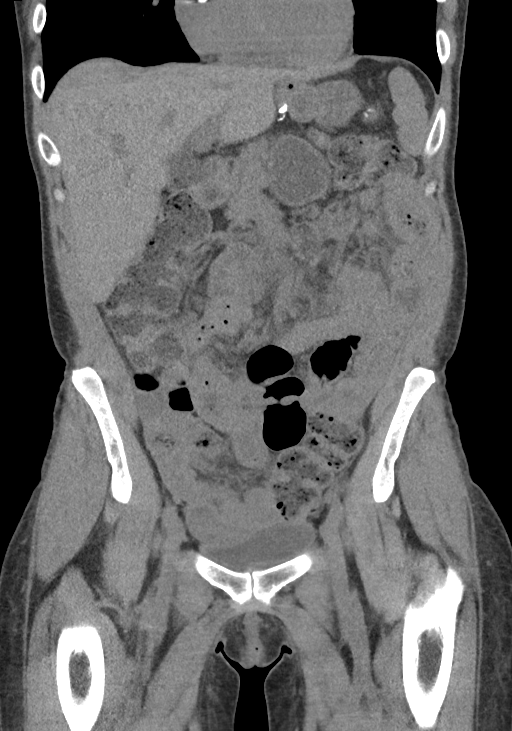
[im 35/79  soft-tissue]
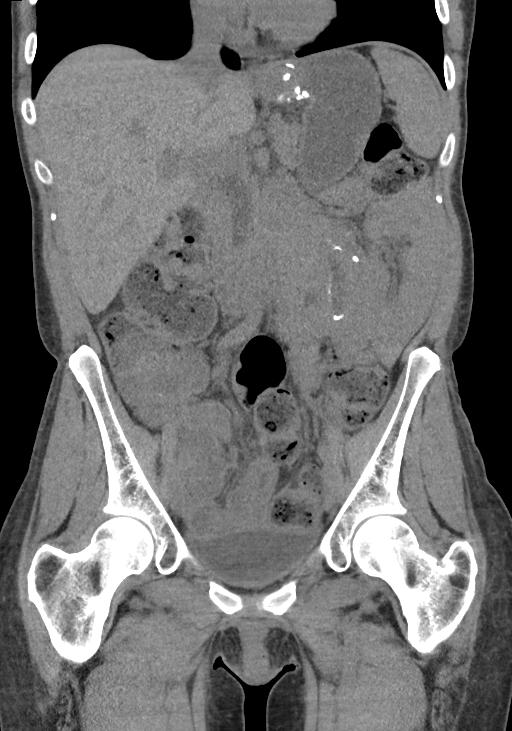
[im 44/79  soft-tissue]
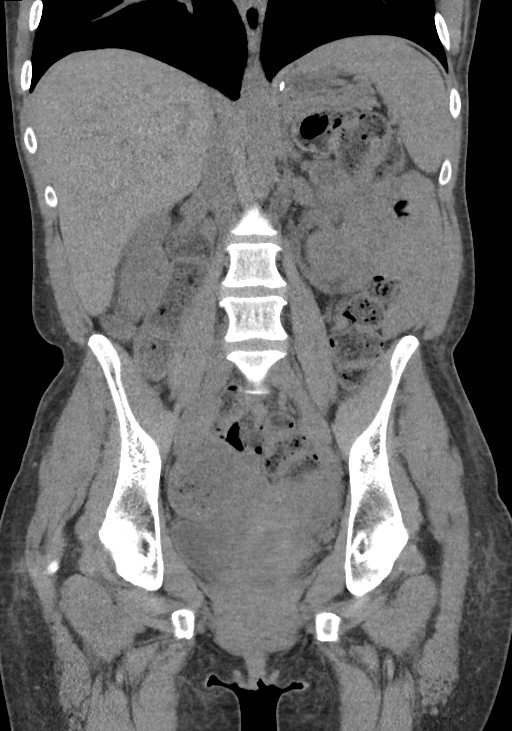

[15 of 46 positions shown; findings below may reference images not displayed]

FINDINGS: Lower chest: No acute abnormality.

Hepatobiliary: Liver is normal in size and contour with no
suspicious mass identified. Gallbladder is surgically absent.
Persistent mild biliary ductal dilatation including common bile duct
measuring 11 mm in diameter, unchanged.

Pancreas: Unremarkable. No pancreatic ductal dilatation or
surrounding inflammatory changes.

Spleen: Normal in size without focal abnormality.

Adrenals/Urinary Tract: Adrenal glands are unremarkable. Kidneys are
normal, without renal calculi, focal lesion, or hydronephrosis.
Bladder is unremarkable.

Stomach/Bowel: Gastric bypass surgical changes. The proximal
hepatobiliary limb is fluid-filled and mild-to-moderately distended
with the duodenum measuring 3.1 cm in diameter. The bowel is not
distended at or around the distal jejunojejunal anastomosis. No
bowel wall edema identified. Large amount of retained fecal material
in the colon. No evidence of acute appendicitis.

Vascular/Lymphatic: No significant vascular findings are present. No
enlarged abdominal or pelvic lymph nodes.

Reproductive: Uterus and bilateral adnexa are unremarkable.

Other: No ascites.

Musculoskeletal: No suspicious bony lesions.
IMPRESSION: 1. Gastric bypass surgical changes. Fluid-filled mild-to-moderate
distension of the hepatobiliary limb/afferent limb, increased since
previous study. No abnormal bowel distension visualized more
distally near the jejunal-jejunal anastomosis. Nonspecific and could
represent early/partial obstruction, gastrogastric fistula,
transient distension. Correlate clinically and follow-up as
indicated.
2. Large amount of retained fecal material throughout the colon,
correlate for constipation.
3. Stable biliary ductal dilatation, most likely compensatory from
cholecystectomy. Correlate with bilirubin levels.

## 2023-01-25 IMAGING — CR DG ABDOMEN 2V
3 series · 3 of 3 positions shown · non-contrast
Comparison: None.

CLINICAL DATA: Right-sided abdominal pain

EXAM:
ABDOMEN - 2 VIEW

[abdomen erect]
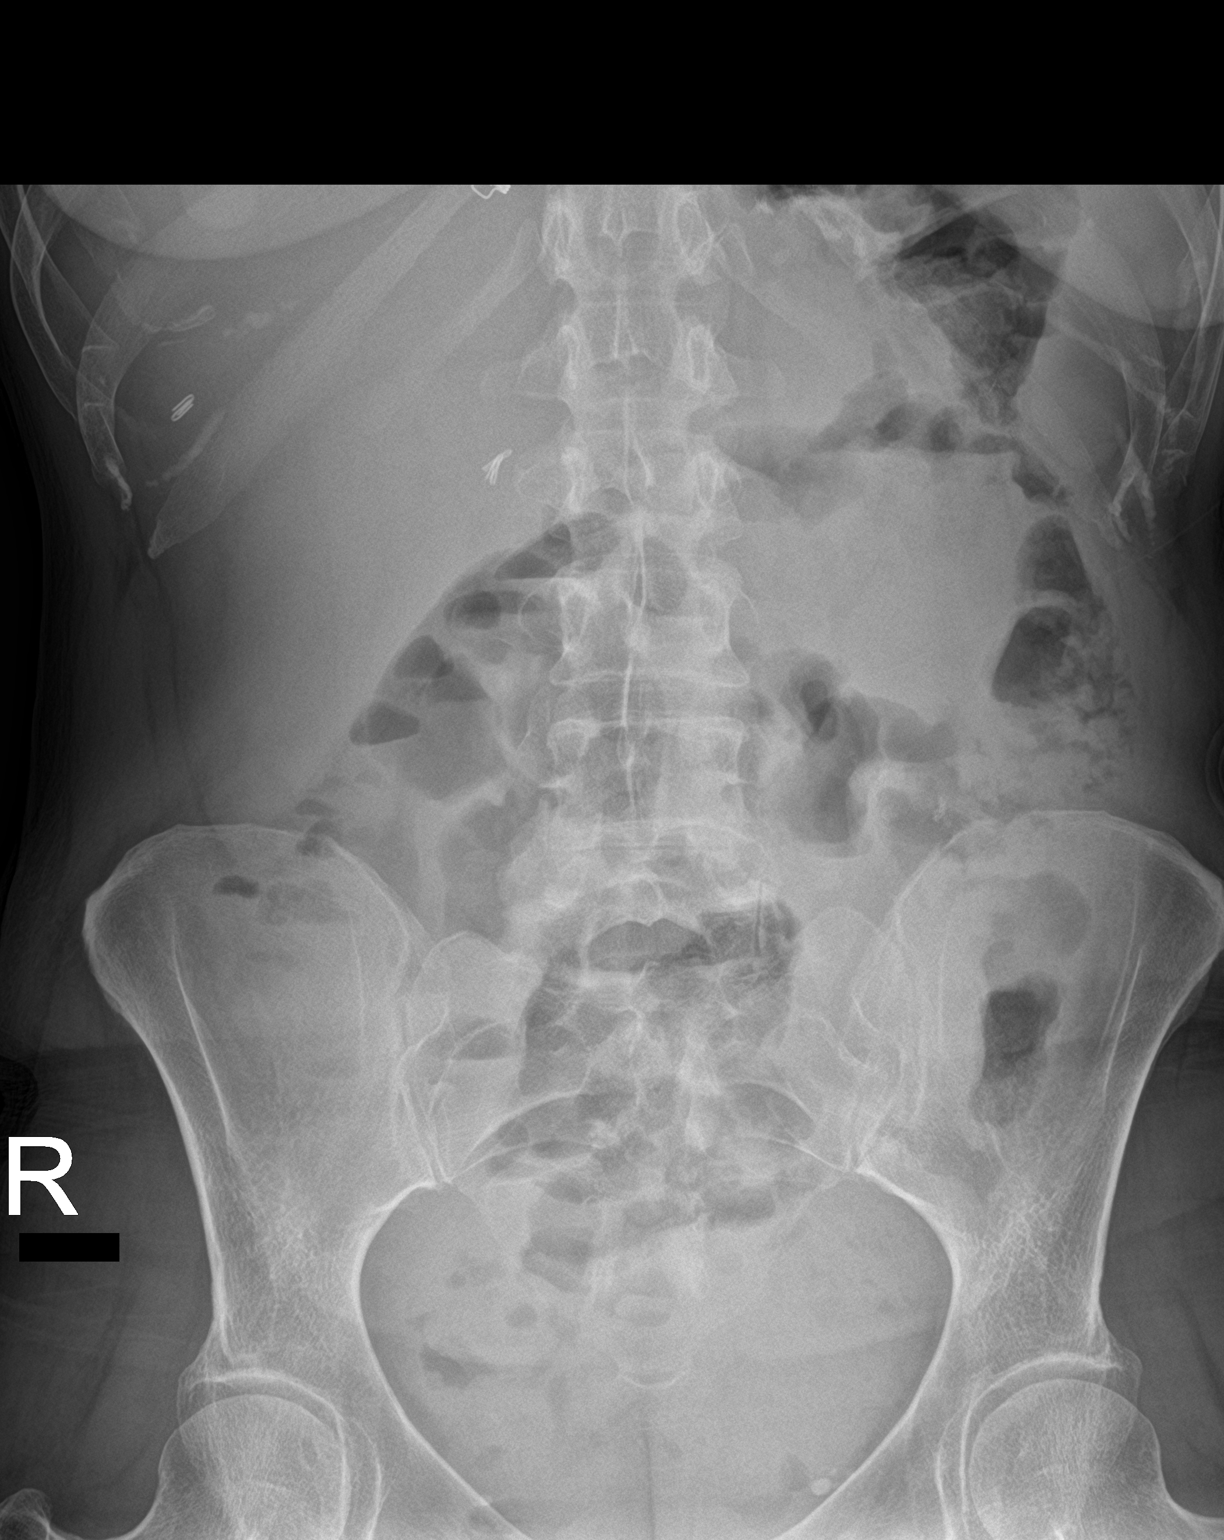

[abdomen supine (1 of 2)]
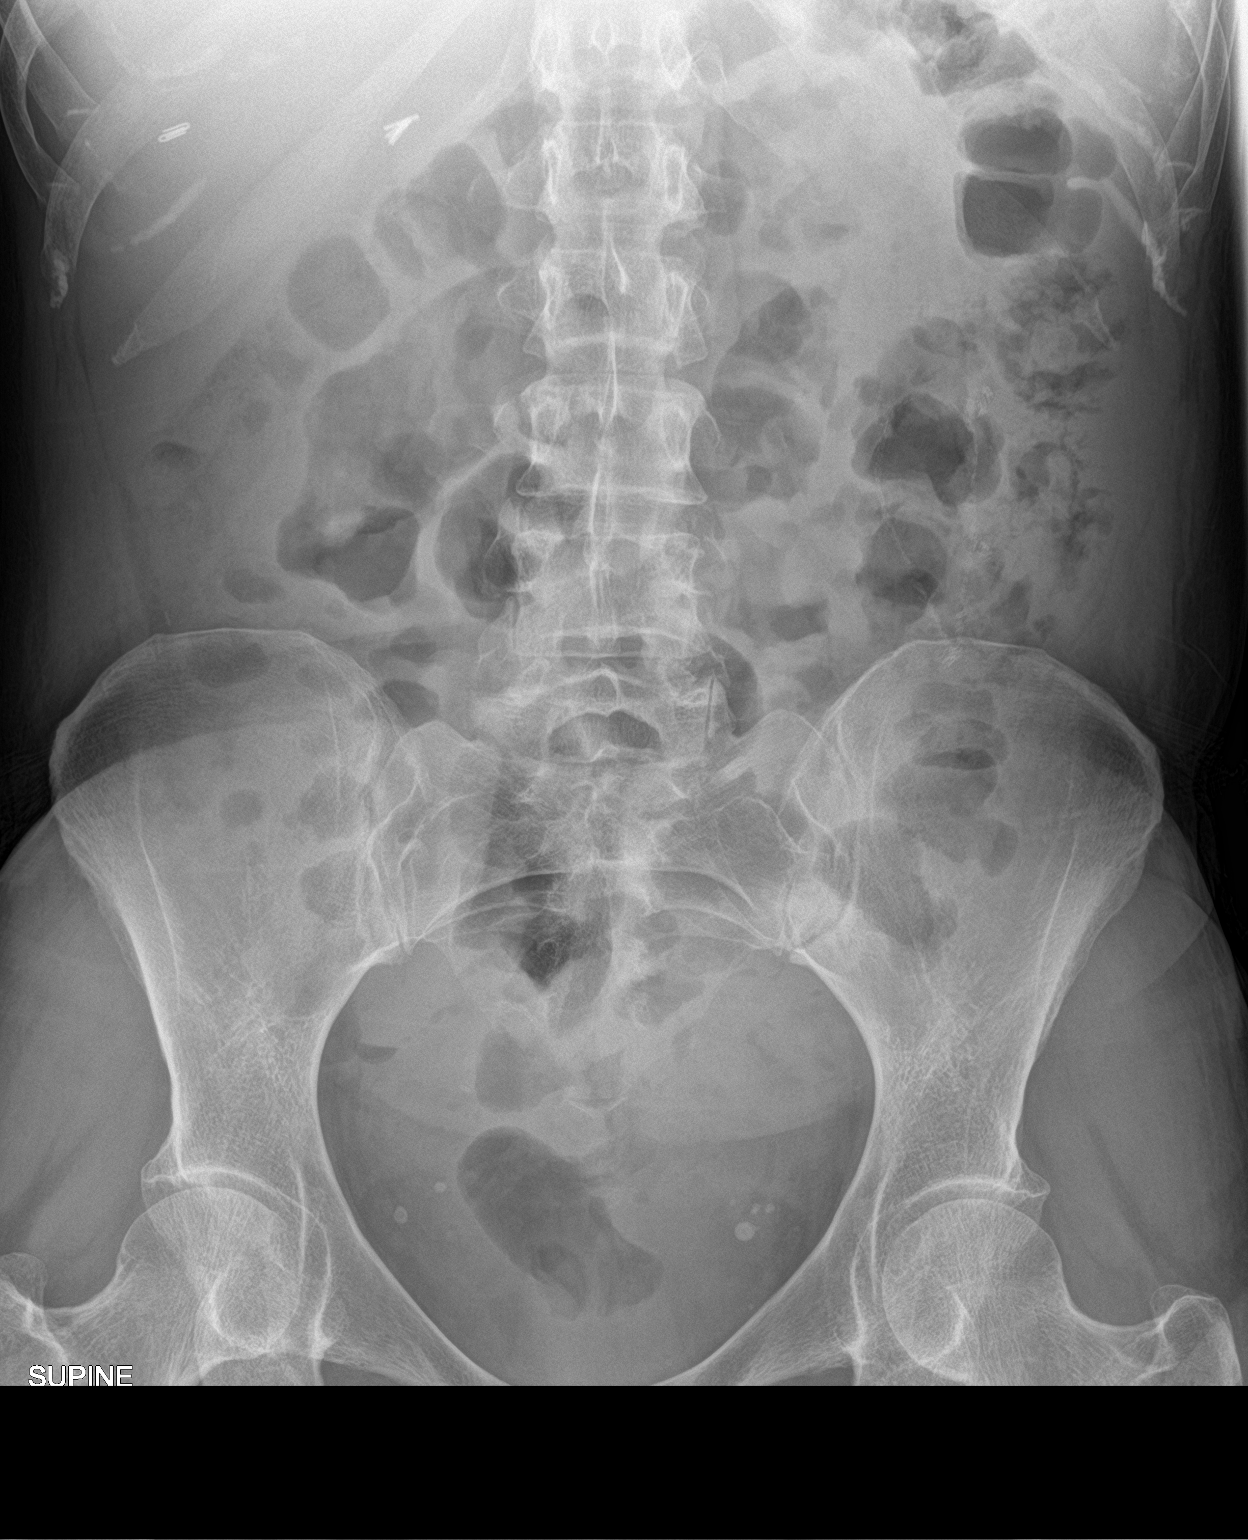

[abdomen supine (2 of 2)]
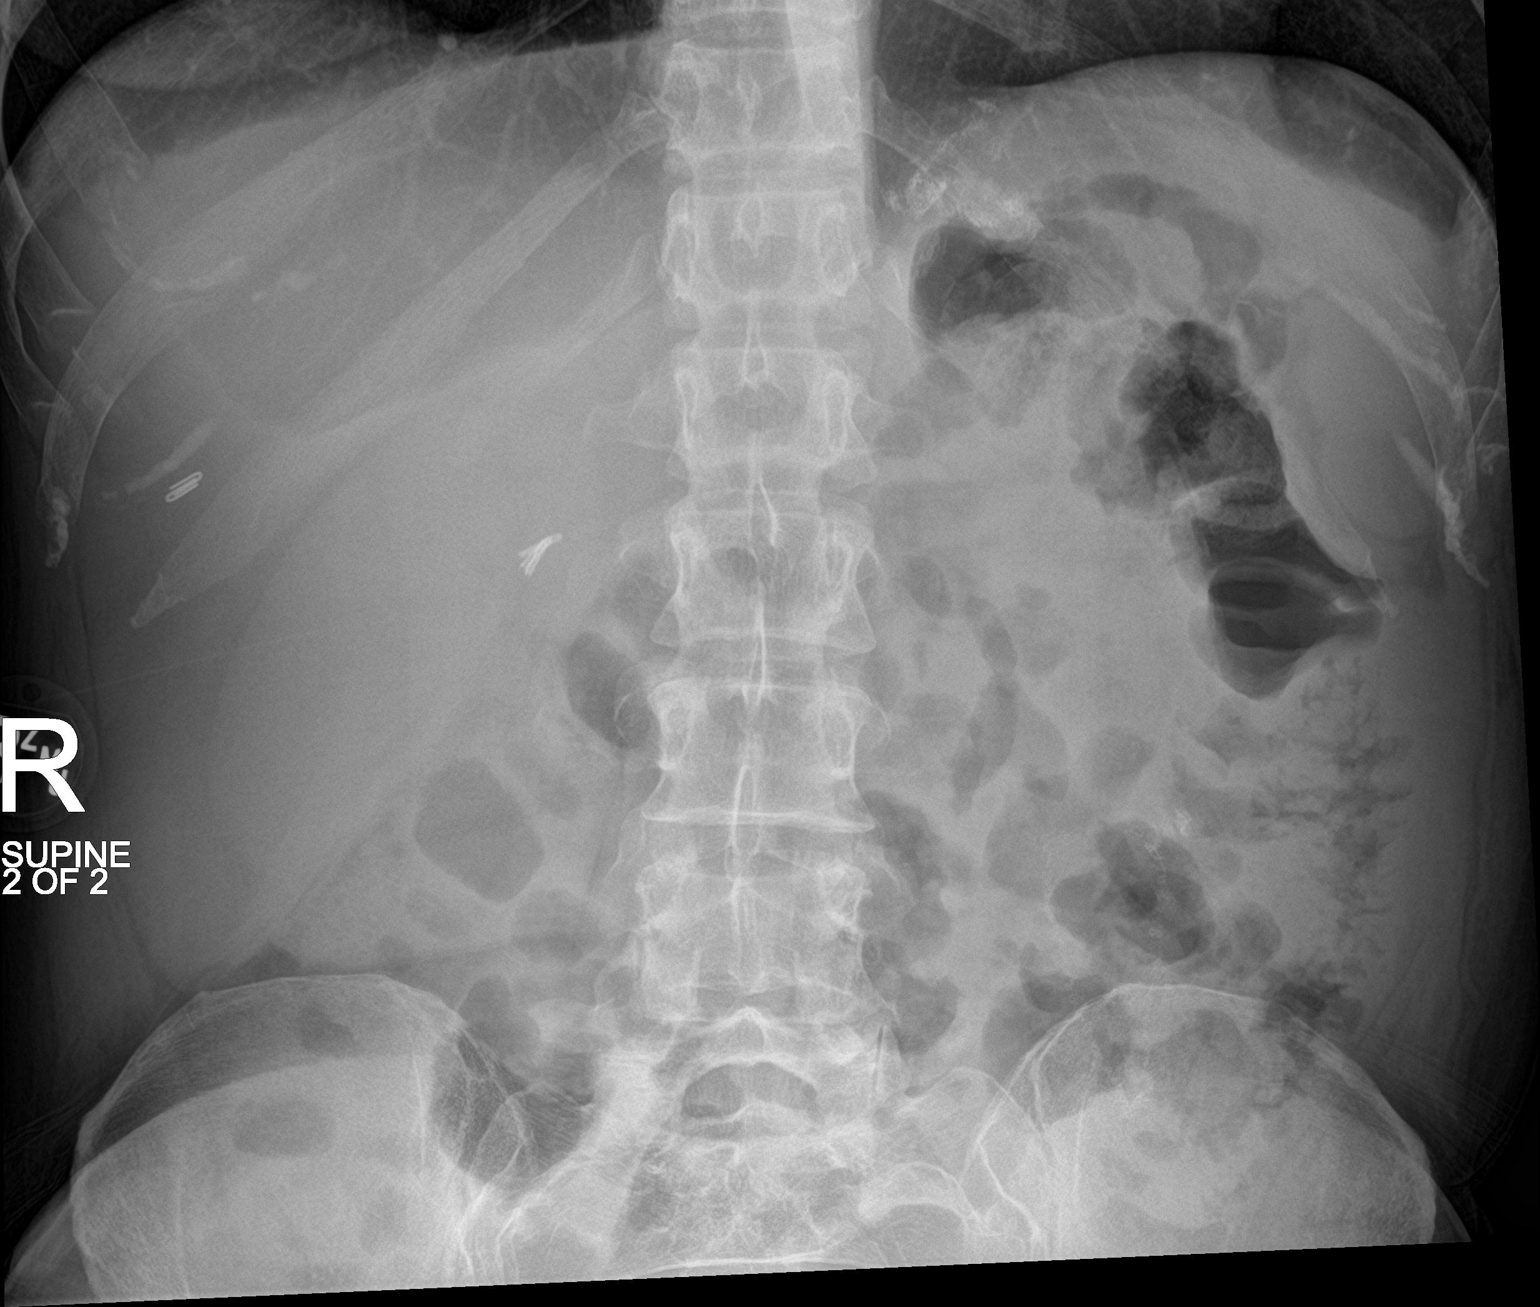

[3 of 3 positions shown; findings below may reference images not displayed]

FINDINGS: Nonobstructive pattern of bowel gas. No free air in the abdomen.
Scattered stool throughout the colon. No radio-opaque calculi or
other significant radiographic abnormality is seen.
IMPRESSION: Nonobstructive pattern of bowel gas. Scattered stool throughout the
colon. No free air in the abdomen.

## 2023-09-17 DEATH — deceased
